# Patient Record
Sex: Female | Born: 1935 | Race: Black or African American | Hispanic: Yes | Marital: Married | Smoking: Former smoker
Health system: Southern US, Community
[De-identification: ages and names within clinical notes are randomized; demographics above are authoritative.]

## PROBLEM LIST (undated history)

## (undated) DIAGNOSIS — R42 Dizziness and giddiness: Secondary | ICD-10-CM

## (undated) DIAGNOSIS — K649 Unspecified hemorrhoids: Secondary | ICD-10-CM

## (undated) DIAGNOSIS — K589 Irritable bowel syndrome without diarrhea: Secondary | ICD-10-CM

## (undated) DIAGNOSIS — R351 Nocturia: Secondary | ICD-10-CM

## (undated) DIAGNOSIS — G43909 Migraine, unspecified, not intractable, without status migrainosus: Secondary | ICD-10-CM

## (undated) DIAGNOSIS — D259 Leiomyoma of uterus, unspecified: Secondary | ICD-10-CM

## (undated) DIAGNOSIS — M2240 Chondromalacia patellae, unspecified knee: Secondary | ICD-10-CM

## (undated) DIAGNOSIS — Z8719 Personal history of other diseases of the digestive system: Secondary | ICD-10-CM

## (undated) DIAGNOSIS — Z96651 Presence of right artificial knee joint: Secondary | ICD-10-CM

## (undated) DIAGNOSIS — Z9889 Other specified postprocedural states: Secondary | ICD-10-CM

## (undated) DIAGNOSIS — M19071 Primary osteoarthritis, right ankle and foot: Secondary | ICD-10-CM

## (undated) DIAGNOSIS — M5136 Other intervertebral disc degeneration, lumbar region: Secondary | ICD-10-CM

## (undated) DIAGNOSIS — R600 Localized edema: Secondary | ICD-10-CM

## (undated) DIAGNOSIS — K209 Esophagitis, unspecified: Secondary | ICD-10-CM

## (undated) DIAGNOSIS — Z923 Personal history of irradiation: Secondary | ICD-10-CM

## (undated) DIAGNOSIS — C801 Malignant (primary) neoplasm, unspecified: Secondary | ICD-10-CM

## (undated) DIAGNOSIS — M19072 Primary osteoarthritis, left ankle and foot: Secondary | ICD-10-CM

## (undated) DIAGNOSIS — R112 Nausea with vomiting, unspecified: Secondary | ICD-10-CM

## (undated) DIAGNOSIS — C50919 Malignant neoplasm of unspecified site of unspecified female breast: Secondary | ICD-10-CM

## (undated) DIAGNOSIS — Z8739 Personal history of other diseases of the musculoskeletal system and connective tissue: Secondary | ICD-10-CM

## (undated) DIAGNOSIS — I4891 Unspecified atrial fibrillation: Secondary | ICD-10-CM

## (undated) DIAGNOSIS — M1990 Unspecified osteoarthritis, unspecified site: Secondary | ICD-10-CM

## (undated) DIAGNOSIS — M19041 Primary osteoarthritis, right hand: Secondary | ICD-10-CM

## (undated) DIAGNOSIS — M19042 Primary osteoarthritis, left hand: Secondary | ICD-10-CM

## (undated) DIAGNOSIS — K449 Diaphragmatic hernia without obstruction or gangrene: Secondary | ICD-10-CM

## (undated) DIAGNOSIS — K219 Gastro-esophageal reflux disease without esophagitis: Secondary | ICD-10-CM

## (undated) DIAGNOSIS — I10 Essential (primary) hypertension: Secondary | ICD-10-CM

## (undated) DIAGNOSIS — Z8759 Personal history of other complications of pregnancy, childbirth and the puerperium: Principal | ICD-10-CM

## (undated) DIAGNOSIS — I499 Cardiac arrhythmia, unspecified: Secondary | ICD-10-CM

## (undated) DIAGNOSIS — Z9221 Personal history of antineoplastic chemotherapy: Secondary | ICD-10-CM

## (undated) HISTORY — DX: Unspecified atrial fibrillation: I48.91

## (undated) HISTORY — DX: Irritable bowel syndrome, unspecified: K58.9

## (undated) HISTORY — DX: Primary osteoarthritis, right ankle and foot: M19.071

## (undated) HISTORY — PX: SHOULDER SURGERY: SHX246

## (undated) HISTORY — DX: Dizziness and giddiness: R42

## (undated) HISTORY — PX: COLONOSCOPY: SHX174

## (undated) HISTORY — DX: Personal history of other complications of pregnancy, childbirth and the puerperium: Z87.59

## (undated) HISTORY — DX: Unspecified osteoarthritis, unspecified site: M19.90

## (undated) HISTORY — DX: Diaphragmatic hernia without obstruction or gangrene: K44.9

## (undated) HISTORY — PX: EYE SURGERY: SHX253

## (undated) HISTORY — DX: Primary osteoarthritis, left ankle and foot: M19.072

## (undated) HISTORY — DX: Primary osteoarthritis, left hand: M19.042

## (undated) HISTORY — DX: Chondromalacia patellae, unspecified knee: M22.40

## (undated) HISTORY — DX: Other intervertebral disc degeneration, lumbar region: M51.36

## (undated) HISTORY — DX: Presence of right artificial knee joint: Z96.651

## (undated) HISTORY — DX: Personal history of other diseases of the musculoskeletal system and connective tissue: Z87.39

## (undated) HISTORY — DX: Primary osteoarthritis, right hand: M19.041

## (undated) HISTORY — PX: EXPLORATORY LAPAROTOMY: SUR591

## (undated) HISTORY — DX: Esophagitis, unspecified: K20.9

## (undated) HISTORY — DX: Essential (primary) hypertension: I10

## (undated) HISTORY — PX: APPENDECTOMY: SHX54

## (undated) HISTORY — DX: Localized edema: R60.0

## (undated) HISTORY — DX: Leiomyoma of uterus, unspecified: D25.9

---

## 1965-01-08 HISTORY — PX: UTERINE FIBROID SURGERY: SHX826

## 1972-01-09 HISTORY — PX: ABDOMINAL HYSTERECTOMY: SHX81

## 1984-01-09 HISTORY — PX: BUNIONECTOMY: SHX129

## 1999-01-09 HISTORY — PX: BACK SURGERY: SHX140

## 2004-09-13 ENCOUNTER — Encounter: Admission: RE | Admit: 2004-09-13 | Discharge: 2004-09-13 | Payer: Self-pay | Admitting: Family Medicine

## 2005-09-14 ENCOUNTER — Encounter: Admission: RE | Admit: 2005-09-14 | Discharge: 2005-09-14 | Payer: Self-pay | Admitting: Family Medicine

## 2005-11-02 ENCOUNTER — Ambulatory Visit: Payer: Self-pay | Admitting: Oncology

## 2005-11-15 LAB — CBC WITH DIFFERENTIAL/PLATELET
Basophils Absolute: 0 10*3/uL (ref 0.0–0.1)
EOS%: 0.9 % (ref 0.0–7.0)
Eosinophils Absolute: 0.1 10*3/uL (ref 0.0–0.5)
HGB: 13.9 g/dL (ref 11.6–15.9)
LYMPH%: 57 % — ABNORMAL HIGH (ref 14.0–48.0)
MCV: 82.8 fL (ref 81.0–101.0)
MONO#: 0.4 10*3/uL (ref 0.1–0.9)
MONO%: 7.3 % (ref 0.0–13.0)
NEUT#: 2 10*3/uL (ref 1.5–6.5)
Platelets: 199 10*3/uL (ref 145–400)
RBC: 5.1 10*6/uL (ref 3.70–5.32)
WBC: 5.8 10*3/uL (ref 3.9–10.0)
lymph#: 3.3 10*3/uL (ref 0.9–3.3)

## 2005-11-15 LAB — LACTATE DEHYDROGENASE: LDH: 166 U/L (ref 94–250)

## 2005-11-15 LAB — COMPREHENSIVE METABOLIC PANEL
ALT: 30 U/L (ref 0–35)
Alkaline Phosphatase: 94 U/L (ref 39–117)
BUN: 12 mg/dL (ref 6–23)
Calcium: 10.1 mg/dL (ref 8.4–10.5)
Glucose, Bld: 115 mg/dL — ABNORMAL HIGH (ref 70–99)
Potassium: 4.2 mEq/L (ref 3.5–5.3)
Sodium: 142 mEq/L (ref 135–145)

## 2005-11-15 LAB — CHCC SMEAR

## 2006-03-18 ENCOUNTER — Ambulatory Visit: Payer: Self-pay | Admitting: Oncology

## 2006-03-21 LAB — CBC WITH DIFFERENTIAL/PLATELET
BASO%: 1.3 % (ref 0.0–2.0)
EOS%: 1.2 % (ref 0.0–7.0)
MCH: 27 pg (ref 26.0–34.0)
MONO#: 0.5 10*3/uL (ref 0.1–0.9)
Platelets: 309 10*3/uL (ref 145–400)
RDW: 11.8 % (ref 11.3–14.5)
lymph#: 3.6 10*3/uL — ABNORMAL HIGH (ref 0.9–3.3)

## 2006-09-17 ENCOUNTER — Encounter: Admission: RE | Admit: 2006-09-17 | Discharge: 2006-09-17 | Payer: Self-pay | Admitting: Family Medicine

## 2006-10-04 ENCOUNTER — Ambulatory Visit: Payer: Self-pay | Admitting: Oncology

## 2006-10-22 ENCOUNTER — Other Ambulatory Visit: Admission: RE | Admit: 2006-10-22 | Discharge: 2006-10-22 | Payer: Self-pay | Admitting: Oncology

## 2006-10-22 ENCOUNTER — Encounter: Payer: Self-pay | Admitting: Oncology

## 2006-10-22 LAB — COMPREHENSIVE METABOLIC PANEL
ALT: 20 U/L (ref 0–35)
AST: 19 U/L (ref 0–37)
Calcium: 9.9 mg/dL (ref 8.4–10.5)
Chloride: 104 mEq/L (ref 96–112)
Creatinine, Ser: 0.81 mg/dL (ref 0.40–1.20)
Glucose, Bld: 102 mg/dL — ABNORMAL HIGH (ref 70–99)
Potassium: 4.7 mEq/L (ref 3.5–5.3)
Total Bilirubin: 0.6 mg/dL (ref 0.3–1.2)
Total Protein: 6.9 g/dL (ref 6.0–8.3)

## 2006-10-22 LAB — CBC WITH DIFFERENTIAL/PLATELET
BASO%: 0.5 % (ref 0.0–2.0)
Basophils Absolute: 0 10*3/uL (ref 0.0–0.1)
EOS%: 0.6 % (ref 0.0–7.0)
Eosinophils Absolute: 0 10*3/uL (ref 0.0–0.5)
LYMPH%: 62.9 % — ABNORMAL HIGH (ref 14.0–48.0)
MONO%: 6.7 % (ref 0.0–13.0)
NEUT%: 29.3 % — ABNORMAL LOW (ref 39.6–76.8)
Platelets: 203 10*3/uL (ref 145–400)
RBC: 4.92 10*6/uL (ref 3.70–5.32)
RDW: 14.6 % — ABNORMAL HIGH (ref 11.3–14.5)

## 2006-10-22 LAB — LACTATE DEHYDROGENASE: LDH: 162 U/L (ref 94–250)

## 2006-10-31 LAB — FLOW CYTOMETRY

## 2007-09-22 ENCOUNTER — Encounter: Admission: RE | Admit: 2007-09-22 | Discharge: 2007-09-22 | Payer: Self-pay | Admitting: Family Medicine

## 2007-10-20 ENCOUNTER — Ambulatory Visit: Payer: Self-pay | Admitting: Oncology

## 2008-07-27 ENCOUNTER — Encounter: Admission: RE | Admit: 2008-07-27 | Discharge: 2008-08-24 | Payer: Self-pay | Admitting: Family Medicine

## 2008-09-24 ENCOUNTER — Encounter: Admission: RE | Admit: 2008-09-24 | Discharge: 2008-09-24 | Payer: Self-pay | Admitting: Family Medicine

## 2009-02-24 DIAGNOSIS — I10 Essential (primary) hypertension: Secondary | ICD-10-CM

## 2009-02-24 HISTORY — DX: Essential (primary) hypertension: I10

## 2009-06-29 DIAGNOSIS — R600 Localized edema: Secondary | ICD-10-CM

## 2009-06-29 HISTORY — DX: Localized edema: R60.0

## 2009-09-27 ENCOUNTER — Encounter: Admission: RE | Admit: 2009-09-27 | Discharge: 2009-09-27 | Payer: Self-pay | Admitting: Family Medicine

## 2010-07-11 ENCOUNTER — Ambulatory Visit (INDEPENDENT_AMBULATORY_CARE_PROVIDER_SITE_OTHER): Payer: Medicare Other | Admitting: Surgery

## 2010-07-11 ENCOUNTER — Encounter (INDEPENDENT_AMBULATORY_CARE_PROVIDER_SITE_OTHER): Payer: Self-pay | Admitting: Surgery

## 2010-07-11 VITALS — BP 154/96 | HR 88 | Temp 96.4°F | Ht 67.5 in | Wt 222.2 lb

## 2010-07-11 DIAGNOSIS — R32 Unspecified urinary incontinence: Secondary | ICD-10-CM

## 2010-07-11 DIAGNOSIS — K589 Irritable bowel syndrome without diarrhea: Secondary | ICD-10-CM

## 2010-07-11 DIAGNOSIS — R5383 Other fatigue: Secondary | ICD-10-CM

## 2010-07-11 DIAGNOSIS — K645 Perianal venous thrombosis: Secondary | ICD-10-CM

## 2010-07-11 DIAGNOSIS — R6883 Chills (without fever): Secondary | ICD-10-CM

## 2010-07-11 DIAGNOSIS — K649 Unspecified hemorrhoids: Secondary | ICD-10-CM | POA: Insufficient documentation

## 2010-07-11 DIAGNOSIS — R5381 Other malaise: Secondary | ICD-10-CM

## 2010-07-11 DIAGNOSIS — M1990 Unspecified osteoarthritis, unspecified site: Secondary | ICD-10-CM

## 2010-07-11 DIAGNOSIS — R61 Generalized hyperhidrosis: Secondary | ICD-10-CM

## 2010-07-11 DIAGNOSIS — R635 Abnormal weight gain: Secondary | ICD-10-CM

## 2010-07-11 NOTE — Progress Notes (Signed)
Referring doctor Dr. Marjory Lies Chief Complaint  Patient presents with  . Other    eval of hems   This is a 76 year old female who was sent from Dr. Mellody Life office. Please note that there are no records from his office. The patient reports that she called yesterday to their office with a continued complaint of pelvic pain and perirectal pain and she was just referred to our office with no office visit at Dr. Mellody Life.  The patient reports a recent trip to [place] in which she developed a Giardia infection. She had significant amounts of diarrhea for several days. She subsequently developed a lot of swelling around her rectum as well as in her perineum. She had difficulty with urination. Apparently a urine culture was obtained at Dr. Mellody Life office which was reportedly negative. She states that the swelling has improved somewhat over the last few days. The pain is slightly improved as well. She continues to have bowel movements daily.  Past Medical History  Diagnosis Date  . Hypertension   . IBS (irritable bowel syndrome)   . Arthritis    Past Surgical History  Procedure Date  . Cesarean section 1971  . Abdominal hysterectomy 1974  . Back surgery 2001  . Uterine fibroid surgery 1967  . Bunionectomy 1986   Allergies  Allergen Reactions  . Oxycodone Nausea And Vomiting  . Percocet (Oxycodone-Acetaminophen) Itching and Nausea And Vomiting  . Soma (Carisoprodol) Nausea And Vomiting  . Ultram (Tramadol Hcl) Nausea And Vomiting   See med list  Filed Vitals:   07/11/10 1640  BP: 154/96  Pulse: 88  Temp: 96.4 F (35.8 C)  Physical examination Well-developed,well-nourished female who appears mildly uncomfortable Focus examination of the perirectal region shows several mildly enlarged external hemorrhoids. The most anterior hemorrhoid appears to have some thrombosis but seems to be somewhat soft and is not particularly tender to palpation. I cannot palpate any anal  fissure.  Impression: Thrombosed external hemorrhoid which was probably most acute one week ago. The clot seems to be resolving on its own.  Recommendations: The patient needs to use frequent sitz baths to help with the discomfort and swelling. Recommend using wet wipes instead of dry toilet paper. She may use stool softeners as needed for constipation. No surgical indications at this time. Followup in 2-3 weeks for recheck.

## 2010-07-11 NOTE — Patient Instructions (Signed)
Use stool softeners as needed to prevent constipation. Sitz baths (soak in warm, soapy water) three or four times a day.

## 2010-08-02 ENCOUNTER — Ambulatory Visit (INDEPENDENT_AMBULATORY_CARE_PROVIDER_SITE_OTHER): Payer: Medicare Other | Admitting: Surgery

## 2010-08-02 ENCOUNTER — Encounter (INDEPENDENT_AMBULATORY_CARE_PROVIDER_SITE_OTHER): Payer: Self-pay | Admitting: Surgery

## 2010-08-02 DIAGNOSIS — K645 Perianal venous thrombosis: Secondary | ICD-10-CM

## 2010-08-02 NOTE — Patient Instructions (Signed)
Continue sitz baths, wet wipes, and stool softeners as needed.

## 2010-08-02 NOTE — Progress Notes (Signed)
Followup of her thrombosed external hemorrhoids. These have essentially resolved. She has a few small skin tags. She is nontender on rectal examination. Continue with wet wipes, sitz baths p.r.n., stool softeners p.r.n. Followup p.r.n.

## 2010-08-21 ENCOUNTER — Other Ambulatory Visit: Payer: Self-pay | Admitting: Family Medicine

## 2010-08-21 DIAGNOSIS — Z1231 Encounter for screening mammogram for malignant neoplasm of breast: Secondary | ICD-10-CM

## 2010-09-29 ENCOUNTER — Ambulatory Visit: Payer: Self-pay

## 2010-10-04 ENCOUNTER — Ambulatory Visit
Admission: RE | Admit: 2010-10-04 | Discharge: 2010-10-04 | Disposition: A | Discharge status: Home / Self Care | Payer: Medicare Other | Source: Ambulatory Visit | Attending: Family Medicine | Admitting: Family Medicine

## 2010-10-04 DIAGNOSIS — Z1231 Encounter for screening mammogram for malignant neoplasm of breast: Secondary | ICD-10-CM

## 2011-09-14 ENCOUNTER — Other Ambulatory Visit: Payer: Self-pay | Admitting: Family Medicine

## 2011-09-14 DIAGNOSIS — Z1231 Encounter for screening mammogram for malignant neoplasm of breast: Secondary | ICD-10-CM

## 2011-10-05 ENCOUNTER — Ambulatory Visit
Admission: RE | Admit: 2011-10-05 | Discharge: 2011-10-05 | Disposition: A | Discharge status: Home / Self Care | Payer: Medicare Other | Source: Ambulatory Visit | Attending: Family Medicine | Admitting: Family Medicine

## 2011-10-05 DIAGNOSIS — Z1231 Encounter for screening mammogram for malignant neoplasm of breast: Secondary | ICD-10-CM

## 2011-10-09 ENCOUNTER — Other Ambulatory Visit: Payer: Self-pay | Admitting: Family Medicine

## 2011-10-09 DIAGNOSIS — R928 Other abnormal and inconclusive findings on diagnostic imaging of breast: Secondary | ICD-10-CM

## 2011-10-11 ENCOUNTER — Ambulatory Visit
Admission: RE | Admit: 2011-10-11 | Discharge: 2011-10-11 | Disposition: A | Discharge status: Home / Self Care | Payer: Medicare Other | Source: Ambulatory Visit | Attending: Family Medicine | Admitting: Family Medicine

## 2011-10-11 DIAGNOSIS — R928 Other abnormal and inconclusive findings on diagnostic imaging of breast: Secondary | ICD-10-CM

## 2012-04-08 ENCOUNTER — Encounter: Payer: Self-pay | Admitting: *Deleted

## 2012-04-09 ENCOUNTER — Encounter: Payer: Self-pay | Admitting: Cardiovascular Disease

## 2012-12-03 ENCOUNTER — Telehealth: Payer: Self-pay | Admitting: *Deleted

## 2012-12-03 DIAGNOSIS — Z01818 Encounter for other preprocedural examination: Secondary | ICD-10-CM

## 2012-12-03 DIAGNOSIS — I10 Essential (primary) hypertension: Secondary | ICD-10-CM

## 2012-12-03 DIAGNOSIS — I480 Paroxysmal atrial fibrillation: Secondary | ICD-10-CM

## 2012-12-03 NOTE — Telephone Encounter (Signed)
Dr Allyson Sabal reviewed the chart and want Ms Guastella to have a myoview prior for surgical clearance.  I spoke with patient and she is agreeable.  I will proceed with ordering myoview

## 2012-12-10 ENCOUNTER — Encounter (HOSPITAL_COMMUNITY): Payer: Medicare Other

## 2012-12-11 ENCOUNTER — Ambulatory Visit (HOSPITAL_COMMUNITY)
Admission: RE | Admit: 2012-12-11 | Discharge: 2012-12-11 | Disposition: A | Discharge status: Home / Self Care | Payer: Medicare Other | Source: Ambulatory Visit | Attending: Cardiovascular Disease | Admitting: Cardiovascular Disease

## 2012-12-11 DIAGNOSIS — Z01810 Encounter for preprocedural cardiovascular examination: Secondary | ICD-10-CM

## 2012-12-11 DIAGNOSIS — I480 Paroxysmal atrial fibrillation: Secondary | ICD-10-CM

## 2012-12-11 DIAGNOSIS — E663 Overweight: Secondary | ICD-10-CM | POA: Insufficient documentation

## 2012-12-11 DIAGNOSIS — Z87891 Personal history of nicotine dependence: Secondary | ICD-10-CM | POA: Insufficient documentation

## 2012-12-11 DIAGNOSIS — I4891 Unspecified atrial fibrillation: Secondary | ICD-10-CM

## 2012-12-11 DIAGNOSIS — Z8249 Family history of ischemic heart disease and other diseases of the circulatory system: Secondary | ICD-10-CM | POA: Insufficient documentation

## 2012-12-11 DIAGNOSIS — Z01818 Encounter for other preprocedural examination: Secondary | ICD-10-CM

## 2012-12-11 DIAGNOSIS — I10 Essential (primary) hypertension: Secondary | ICD-10-CM | POA: Insufficient documentation

## 2012-12-11 MED ORDER — AMINOPHYLLINE 25 MG/ML IV SOLN
75.0000 mg | Freq: Once | INTRAVENOUS | Status: AC
  Administered 2012-12-11: 75 mg via INTRAVENOUS

## 2012-12-11 MED ORDER — TECHNETIUM TC 99M SESTAMIBI GENERIC - CARDIOLITE
10.9000 | Freq: Once | INTRAVENOUS | Status: AC | PRN
  Administered 2012-12-11: 10.9 via INTRAVENOUS

## 2012-12-11 MED ORDER — REGADENOSON 0.4 MG/5ML IV SOLN
0.4000 mg | Freq: Once | INTRAVENOUS | Status: AC
  Administered 2012-12-11: 0.4 mg via INTRAVENOUS

## 2012-12-11 MED ORDER — TECHNETIUM TC 99M SESTAMIBI GENERIC - CARDIOLITE
30.2000 | Freq: Once | INTRAVENOUS | Status: AC | PRN
  Administered 2012-12-11: 30.2 via INTRAVENOUS

## 2012-12-11 NOTE — Procedures (Addendum)
[facility] [address] [place] [address] [phone]  Cardiology Nuclear Med Study  Tara Cox is a 78 y.o. female     MRN : [id number]     DOB: 1934-04-01  Procedure Date: 12/11/2012  Nuclear Med Background Indication for Stress Test:  Surgical Clearance History:  A-Fib Cardiac Risk Factors: Family History - CAD, History of Smoking, Hypertension, Lipids and Overweight  Symptoms:  Pt denies symptoms.   Nuclear Pre-Procedure Caffeine/Decaff Intake:  7:00pm NPO After: 5:00am   IV Site: R Hand  IV 0.9% NS with Angio Cath:  22g  Chest Size (in):  n/a IV Started by: Emmit Pomfret, RN  Height: 5\' 8"  (1.727 m)  Cup Size: B  BMI:  Body mass index is 34.98 kg/(m^2). Weight:  230 lb (104.327 kg)   Tech Comments:  n/a    Nuclear Med Study 1 or 2 day study: 1 day  Stress Test Type:  Lexiscan  Order Authorizing Provider:  Nanetta Batty, MD   Resting Radionuclide: Technetium 64m Sestamibi  Resting Radionuclide Dose: 10.9 mCi   Stress Radionuclide:  Technetium 64m Sestamibi  Stress Radionuclide Dose: 30.2 mCi           Stress Protocol Rest HR: 63 Stress HR: 82  Rest BP: 137/86 Stress BP: 144/85  Exercise Time (min): n/a METS: n/a   Predicted Max HR: 144 bpm % Max HR: 67.36 bpm Rate Pressure Product: 21308  Dose of Adenosine (mg):  n/a Dose of Lexiscan: 0.4 mg  Dose of Atropine (mg): n/a Dose of Dobutamine: n/a mcg/kg/min (at max HR)  Stress Test Technologist: Esperanza Sheets, CCT Nuclear Technologist: Gonzella Lex, CNMT   Rest Procedure:  Myocardial perfusion imaging was performed at rest 45 minutes following the intravenous administration of Technetium 57m Sestamibi. Stress Procedure:  The patient received IV Lexiscan 0.4 mg over 15-seconds.  Technetium 82m Sestamibi injected at 30-seconds.  The patient experienced SOB and a headache; 75 mg IV Aminophylline was administered with resolution of symptoms.  There  were  significant changes with Lexiscan.  Quantitative spect images were obtained after a 45 minute delay.  Transient Ischemic Dilatation (Normal <1.22):  0.98 Lung/Heart Ratio (Normal <0.45):  0.22 QGS EDV:  62 ml QGS ESV:  17 ml LV Ejection Fraction: 72%  Signed by     Rest ECG: NSR - Normal EKG  Stress ECG: No significant change from baseline ECG  QPS Raw Data Images:  Normal; no motion artifact; normal heart/lung ratio. Stress Images:  Normal homogeneous uptake in all areas of the myocardium. Rest Images:  Normal homogeneous uptake in all areas of the myocardium. Subtraction (SDS):  No evidence of ischemia.  Impression Exercise Capacity:  Lexiscan with no exercise. BP Response:  Normal blood pressure response. Clinical Symptoms:  No significant symptoms noted. ECG Impression:  No significant ST segment change suggestive of ischemia. Comparison with Prior Nuclear Study: No significant change from previous study  Overall Impression:  Normal stress nuclear study.  LV Wall Motion:  NL LV Function; NL Wall Motion   Runell Gess, MD  12/11/2012 11:04 AM

## 2013-01-12 ENCOUNTER — Other Ambulatory Visit: Payer: Self-pay | Admitting: Orthopedic Surgery

## 2013-01-12 ENCOUNTER — Encounter (HOSPITAL_COMMUNITY): Payer: Self-pay | Admitting: Respiratory Therapy

## 2013-01-16 NOTE — Pre-Procedure Instructions (Signed)
CHESTINE BELKNAP  01/16/2013   Your procedure is scheduled on:  Monday January 26, 2013 @ 8:49 AM.  Report to [facility] Short Stay Entrance "A"  Admitting at 6:45 AM.  Call this number if you have problems the morning of surgery: [phone]   Remember:   Do not eat food or drink liquids after midnight.   Take these medicines the morning of surgery with A SIP OF WATER: Metoprolol (Lopressor)   Do not wear jewelry, make-up or nail polish.  Do not wear lotions, powders, or perfumes. You may wear deodorant.  Do not shave 48 hours prior to surgery.   Do not bring valuables to the hospital.  [facility] is not responsible for any belongings or valuables.               Contacts, dentures or bridgework may not be worn into surgery.  Leave suitcase in the car. After surgery it may be brought to your room.  For patients admitted to the hospital, discharge time is determined by your treatment team.               Patients discharged the day of surgery will not be allowed to drive home.  Name and phone number of your driver: Family/Friend  Special Instructions: Shower using CHG 2 nights before surgery and the night before surgery.  If you shower the day of surgery use CHG.  Use special wash - you have one bottle of CHG for all showers.  You should use approximately 1/3 of the bottle for each shower.   Please read over the following fact sheets that you were given: Pain Booklet, Coughing and Deep Breathing, Blood Transfusion Information, Total Joint Packet, MRSA Information and Surgical Site Infection Prevention

## 2013-01-19 ENCOUNTER — Encounter (HOSPITAL_COMMUNITY)
Admission: RE | Admit: 2013-01-19 | Discharge: 2013-01-19 | Disposition: A | Discharge status: Home / Self Care | Payer: Medicare Other | Source: Ambulatory Visit | Attending: Orthopedic Surgery | Admitting: Orthopedic Surgery

## 2013-01-19 ENCOUNTER — Ambulatory Visit (HOSPITAL_COMMUNITY)
Admission: RE | Admit: 2013-01-19 | Discharge: 2013-01-19 | Disposition: A | Discharge status: Home / Self Care | Payer: Medicare Other | Source: Ambulatory Visit | Attending: Orthopedic Surgery | Admitting: Orthopedic Surgery

## 2013-01-19 ENCOUNTER — Encounter (HOSPITAL_COMMUNITY): Payer: Self-pay

## 2013-01-19 DIAGNOSIS — Z01818 Encounter for other preprocedural examination: Secondary | ICD-10-CM | POA: Insufficient documentation

## 2013-01-19 DIAGNOSIS — Z01812 Encounter for preprocedural laboratory examination: Secondary | ICD-10-CM | POA: Insufficient documentation

## 2013-01-19 DIAGNOSIS — G4733 Obstructive sleep apnea (adult) (pediatric): Secondary | ICD-10-CM | POA: Insufficient documentation

## 2013-01-19 DIAGNOSIS — K219 Gastro-esophageal reflux disease without esophagitis: Secondary | ICD-10-CM | POA: Insufficient documentation

## 2013-01-19 DIAGNOSIS — Z01810 Encounter for preprocedural cardiovascular examination: Secondary | ICD-10-CM | POA: Insufficient documentation

## 2013-01-19 DIAGNOSIS — I10 Essential (primary) hypertension: Secondary | ICD-10-CM | POA: Insufficient documentation

## 2013-01-19 HISTORY — DX: Other specified postprocedural states: Z98.890

## 2013-01-19 HISTORY — DX: Nocturia: R35.1

## 2013-01-19 HISTORY — DX: Migraine, unspecified, not intractable, without status migrainosus: G43.909

## 2013-01-19 HISTORY — DX: Gastro-esophageal reflux disease without esophagitis: K21.9

## 2013-01-19 HISTORY — DX: Other specified postprocedural states: R11.2

## 2013-01-19 HISTORY — DX: Unspecified hemorrhoids: K64.9

## 2013-01-19 LAB — URINALYSIS, ROUTINE W REFLEX MICROSCOPIC
Bilirubin Urine: NEGATIVE
Glucose, UA: NEGATIVE mg/dL
Hgb urine dipstick: NEGATIVE
Ketones, ur: NEGATIVE mg/dL
LEUKOCYTES UA: NEGATIVE
NITRITE: NEGATIVE
PROTEIN: NEGATIVE mg/dL
Specific Gravity, Urine: 1.007 (ref 1.005–1.030)
UROBILINOGEN UA: 0.2 mg/dL (ref 0.0–1.0)
pH: 5 (ref 5.0–8.0)

## 2013-01-19 LAB — TYPE AND SCREEN
ABO/RH(D): B POS
Antibody Screen: NEGATIVE

## 2013-01-19 LAB — CBC WITH DIFFERENTIAL/PLATELET
BASOS ABS: 0 10*3/uL (ref 0.0–0.1)
BASOS PCT: 1 % (ref 0–1)
EOS ABS: 0.1 10*3/uL (ref 0.0–0.7)
EOS PCT: 2 % (ref 0–5)
HEMATOCRIT: 43.8 % (ref 36.0–46.0)
HEMOGLOBIN: 14.8 g/dL (ref 12.0–15.0)
Lymphocytes Relative: 52 % — ABNORMAL HIGH (ref 12–46)
Lymphs Abs: 3.2 10*3/uL (ref 0.7–4.0)
MCH: 27.3 pg (ref 26.0–34.0)
MCHC: 33.8 g/dL (ref 30.0–36.0)
MCV: 80.8 fL (ref 78.0–100.0)
MONO ABS: 0.4 10*3/uL (ref 0.1–1.0)
MONOS PCT: 6 % (ref 3–12)
Neutro Abs: 2.5 10*3/uL (ref 1.7–7.7)
Neutrophils Relative %: 40 % — ABNORMAL LOW (ref 43–77)
Platelets: 182 10*3/uL (ref 150–400)
RBC: 5.42 MIL/uL — ABNORMAL HIGH (ref 3.87–5.11)
RDW: 14.6 % (ref 11.5–15.5)
WBC: 6.2 10*3/uL (ref 4.0–10.5)

## 2013-01-19 LAB — COMPREHENSIVE METABOLIC PANEL
ALBUMIN: 3.8 g/dL (ref 3.5–5.2)
ALT: 36 U/L — ABNORMAL HIGH (ref 0–35)
AST: 27 U/L (ref 0–37)
Alkaline Phosphatase: 95 U/L (ref 39–117)
BUN: 21 mg/dL (ref 6–23)
CALCIUM: 9.7 mg/dL (ref 8.4–10.5)
CO2: 26 mEq/L (ref 19–32)
CREATININE: 0.77 mg/dL (ref 0.50–1.10)
Chloride: 101 mEq/L (ref 96–112)
GFR calc Af Amer: >90 mL/min (ref >90)
GFR calc non Af Amer: 80 mL/min — ABNORMAL LOW (ref >90)
Glucose, Bld: 94 mg/dL (ref 70–99)
Potassium: 4 mEq/L (ref 3.7–5.3)
Sodium: 140 mEq/L (ref 137–147)
TOTAL PROTEIN: 7.3 g/dL (ref 6.0–8.3)
Total Bilirubin: 0.4 mg/dL (ref 0.3–1.2)

## 2013-01-19 LAB — SURGICAL PCR SCREEN
MRSA, PCR: NEGATIVE
STAPHYLOCOCCUS AUREUS: NEGATIVE

## 2013-01-19 LAB — APTT: APTT: 46 s — AB (ref 24–37)

## 2013-01-19 LAB — ABO/RH: ABO/RH(D): B POS

## 2013-01-19 LAB — PROTIME-INR
INR: 2.4 — ABNORMAL HIGH (ref 0.00–1.49)
PROTHROMBIN TIME: 25.4 s — AB (ref 11.6–15.2)

## 2013-01-19 NOTE — Progress Notes (Signed)
Patient informed Nurse that she had a stress test with Cardiologist Dr. Quay Burow last year 12/11/12; results in EPIC. Patient denied having a cardiac cath. Sleep apnea results sent to PCP Dr. Juanita Craver. When asked about stopping Coumadin patient stated "I was told to stop it 5 days before my surgery."

## 2013-01-19 NOTE — Progress Notes (Signed)
01/19/13 0935  OBSTRUCTIVE SLEEP APNEA  Have you ever been diagnosed with sleep apnea through a sleep study? No  Do you snore loudly (loud enough to be heard through closed doors)?  0  Do you often feel tired, fatigued, or sleepy during the daytime? 1  Has anyone observed you stop breathing during your sleep? 0  Do you have, or are you being treated for high blood pressure? 1  BMI more than 35 kg/m2? 1  Age over 77 years old? 1  Neck circumference greater than 40 cm/18 inches? 0  Gender: 0  Obstructive Sleep Apnea Score 4  Score 4 or greater  Results sent to PCP   This patient has screened at risk for sleep apnea using the STOP Bang tool used during a pre-surgical visit. A score of 4 or greater is at risk for sleep apnea.

## 2013-01-20 LAB — URINE CULTURE: Colony Count: 2000

## 2013-01-20 NOTE — H&P (Signed)
Tara Cox MRN:  [id number] DOB/SEX:  1934/03/16/female  CHIEF COMPLAINT:  Painful right Knee  HISTORY: Patient is a 79 y.o. female presented with a history of pain in the right knee. Onset of symptoms was gradual starting several years ago with gradually worsening course since that time. Prior procedures on the knee include none. Patient has been treated conservatively with over-the-counter NSAIDs and activity modification. Patient currently rates pain in the knee at 9 out of 10 with activity. There is pain at night.  PAST MEDICAL HISTORY: Patient Active Problem List   Diagnosis Date Noted  . Hemorrhoids 07/11/2010  . IBS (irritable bowel syndrome) 07/11/2010  . Fatigue/loss of sleep 07/11/2010  . Night sweats 07/11/2010  . Chills 07/11/2010  . Weight gain 07/11/2010  . Arthritis 07/11/2010  . Incontinence of urine 07/11/2010  . Thrombosed external hemorrhoids 07/11/2010   Past Medical History  Diagnosis Date  . IBS (irritable bowel syndrome)   . Arthritis   . A-fib   . Hypertension 02/24/09    Echo-EF 62%; Myocardial Perfusion Study-Normal. No signifcant ischemia noted.  . Edema of lower extremity 06/29/09    Lower Venous Exam- normal. No evidence of thrombus or thrombophlebitis.  Marland Kitchen PONV (postoperative nausea and vomiting)   . Migraine   . GERD (gastroesophageal reflux disease)   . Hemorrhoids   . Frequent urination at night    Past Surgical History  Procedure Laterality Date  . Cesarean section  1971  . Abdominal hysterectomy  1974  . Back surgery  2001  . Uterine fibroid surgery  1967  . Bunionectomy  1986  . Appendectomy    . Eye surgery Bilateral     cataract removal  . Colonoscopy       MEDICATIONS:   No prescriptions prior to admission    ALLERGIES:   Allergies  Allergen Reactions  . Oxycodone Nausea And Vomiting    Full body weakness   . Percocet [Oxycodone-Acetaminophen] Itching  . Soma [Carisoprodol] Nausea And Vomiting  . Ultram [Tramadol  Hcl] Nausea And Vomiting    REVIEW OF SYSTEMS:  Pertinent items are noted in HPI.   FAMILY HISTORY:   Family History  Problem Relation Age of Onset  . Heart disease Father     heart attack  . Diabetes Sister   . Other Sister     pacemaker  . Diabetes Sister   . Heart disease Sister     SOCIAL HISTORY:   History  Substance Use Topics  . Smoking status: Former Research scientist (life sciences)  . Smokeless tobacco: Not on file  . Alcohol Use: No     EXAMINATION:  Vital signs in last 24 hours:    General appearance: alert, cooperative and no distress Lungs: clear to auscultation bilaterally Heart: regular rate and rhythm, S1, S2 normal, no murmur, click, rub or gallop Abdomen: soft, non-tender; bowel sounds normal; no masses,  no organomegaly Extremities: extremities normal, atraumatic, no cyanosis or edema and Homans sign is negative, no sign of DVT Pulses: 2+ and symmetric Skin: Skin color, texture, turgor normal. No rashes or lesions Neurologic: Alert and oriented X 3, normal strength and tone. Normal symmetric reflexes. Normal coordination and gait  Musculoskeletal:  ROM 0-115, Ligaments intact,  Imaging Review Plain radiographs demonstrate severe degenerative joint disease of the right knee. The overall alignment is mild valgus. The bone quality appears to be good for age and reported activity level.  Assessment/Plan: End stage arthritis, right knee   The patient history, physical examination and  imaging studies are consistent with advanced degenerative joint disease of the right knee. The patient has failed conservative treatment.  The clearance notes were reviewed.  After discussion with the patient it was felt that Total Knee Replacement was indicated. The procedure,  risks, and benefits of total knee arthroplasty were presented and reviewed. The risks including but not limited to aseptic loosening, infection, blood clots, vascular injury, stiffness, patella tracking problems complications  among others were discussed. The patient acknowledged the explanation, agreed to proceed with the plan.  Llewellyn Schoenberger 01/20/2013, 1:35 PM

## 2013-01-20 NOTE — Progress Notes (Signed)
Anesthesia Chart Review:  Patient is a 79 year old female scheduled for right TKA on 01/26/13 by Dr. Ronnie Derby. History includes PAF, former smoker, post-operative N/V, IBS, arthritis, GERD, HTN, migraines, LE edema, hysterectomy, back surgery. OSA screening score was 4. PCP is Dr. Juanita Craver.  Cardiologist is Dr. Quay Burow.  Nuclear stress test on 12/11/12 was normal without evidence of ischemia, normal LV function and wall motion. EF 72%.  Echo on 02/24/09 showed: proximal septal thickening. Mild asymmetric left ventricular hypertrophy. Left ventricular systolic function is normal. The mitral valve leaflets appear thickened but open well. Trace mitral regurgitation. Trace tricuspid regurgitation. Aortic valve is normal in structure and function.  EKG on 01/19/13 showed NSR, low voltage QRS, non-specific ST abnormality.  CXR on 01/19/13 showed no active cardiopulmonary disease.  Preoperative labs noted.  PT/PTT elevated. She was already told to hold Coumadin five days preoperatively.  Repeat PT/PTT on arrival.  Urine culture is pending.  If follow-up PT/PTT acceptable then I would anticipate that she could proceed as planned.  George Hugh [facility] Short Stay Center/Anesthesiology Phone [phone] 01/20/2013 11:10 AM

## 2013-01-23 NOTE — Progress Notes (Signed)
Notified patient of time change.  Instructed patient to arrive at 730 am 01-26-13.

## 2013-01-25 MED ORDER — CHLORHEXIDINE GLUCONATE 4 % EX LIQD: 60.0000 mL | Freq: Once | CUTANEOUS | Status: DC

## 2013-01-25 MED ORDER — CEFAZOLIN SODIUM-DEXTROSE 2-3 GM-% IV SOLR
2.0000 g | INTRAVENOUS | Status: AC
Start: 2013-01-26 — End: 2013-01-26
  Administered 2013-01-26: 2 g via INTRAVENOUS
  Filled 2013-01-25: qty 50

## 2013-01-25 MED ORDER — TRANEXAMIC ACID 100 MG/ML IV SOLN
1000.0000 mg | INTRAVENOUS | Status: AC
  Administered 2013-01-26: 1000 mg via INTRAVENOUS
  Filled 2013-01-25: qty 10

## 2013-01-26 ENCOUNTER — Encounter (HOSPITAL_COMMUNITY): Payer: Medicare Other | Admitting: Vascular Surgery

## 2013-01-26 ENCOUNTER — Encounter (HOSPITAL_COMMUNITY)
Admission: RE | Disposition: A | Discharge status: Home Health | Payer: Self-pay | Source: Ambulatory Visit | Attending: Orthopedic Surgery

## 2013-01-26 ENCOUNTER — Encounter (HOSPITAL_COMMUNITY): Payer: Self-pay | Admitting: Anesthesiology

## 2013-01-26 ENCOUNTER — Inpatient Hospital Stay (HOSPITAL_COMMUNITY): Payer: Medicare Other | Admitting: Anesthesiology

## 2013-01-26 ENCOUNTER — Inpatient Hospital Stay (HOSPITAL_COMMUNITY)
Admission: RE | Admit: 2013-01-26 | Discharge: 2013-01-28 | DRG: 470 | Disposition: A | Discharge status: Home Health | Payer: Medicare Other | Source: Ambulatory Visit | Attending: Orthopedic Surgery | Admitting: Orthopedic Surgery

## 2013-01-26 DIAGNOSIS — G43909 Migraine, unspecified, not intractable, without status migrainosus: Secondary | ICD-10-CM | POA: Diagnosis present

## 2013-01-26 DIAGNOSIS — Z87891 Personal history of nicotine dependence: Secondary | ICD-10-CM

## 2013-01-26 DIAGNOSIS — K589 Irritable bowel syndrome without diarrhea: Secondary | ICD-10-CM | POA: Diagnosis present

## 2013-01-26 DIAGNOSIS — R110 Nausea: Secondary | ICD-10-CM | POA: Diagnosis not present

## 2013-01-26 DIAGNOSIS — K219 Gastro-esophageal reflux disease without esophagitis: Secondary | ICD-10-CM | POA: Diagnosis present

## 2013-01-26 DIAGNOSIS — T40605A Adverse effect of unspecified narcotics, initial encounter: Secondary | ICD-10-CM | POA: Diagnosis not present

## 2013-01-26 DIAGNOSIS — I10 Essential (primary) hypertension: Secondary | ICD-10-CM | POA: Diagnosis present

## 2013-01-26 DIAGNOSIS — Z9089 Acquired absence of other organs: Secondary | ICD-10-CM

## 2013-01-26 DIAGNOSIS — M1710 Unilateral primary osteoarthritis, unspecified knee: Principal | ICD-10-CM | POA: Diagnosis present

## 2013-01-26 DIAGNOSIS — Z8249 Family history of ischemic heart disease and other diseases of the circulatory system: Secondary | ICD-10-CM

## 2013-01-26 DIAGNOSIS — Z7901 Long term (current) use of anticoagulants: Secondary | ICD-10-CM

## 2013-01-26 DIAGNOSIS — Z885 Allergy status to narcotic agent status: Secondary | ICD-10-CM

## 2013-01-26 DIAGNOSIS — Y9210 Unspecified residential institution as the place of occurrence of the external cause: Secondary | ICD-10-CM | POA: Diagnosis not present

## 2013-01-26 DIAGNOSIS — I4891 Unspecified atrial fibrillation: Secondary | ICD-10-CM | POA: Diagnosis present

## 2013-01-26 DIAGNOSIS — M179 Osteoarthritis of knee, unspecified: Secondary | ICD-10-CM | POA: Diagnosis present

## 2013-01-26 DIAGNOSIS — Z833 Family history of diabetes mellitus: Secondary | ICD-10-CM

## 2013-01-26 DIAGNOSIS — Z79899 Other long term (current) drug therapy: Secondary | ICD-10-CM

## 2013-01-26 HISTORY — PX: TOTAL KNEE ARTHROPLASTY: SHX125

## 2013-01-26 LAB — GLUCOSE, CAPILLARY: GLUCOSE-CAPILLARY: 101 mg/dL — AB (ref 70–99)

## 2013-01-26 LAB — CBC
HCT: 39.7 % (ref 36.0–46.0)
HEMOGLOBIN: 12.9 g/dL (ref 12.0–15.0)
MCH: 27.1 pg (ref 26.0–34.0)
MCHC: 32.5 g/dL (ref 30.0–36.0)
MCV: 83.4 fL (ref 78.0–100.0)
Platelets: 147 10*3/uL — ABNORMAL LOW (ref 150–400)
RBC: 4.76 MIL/uL (ref 3.87–5.11)
RDW: 14.2 % (ref 11.5–15.5)
WBC: 12 10*3/uL — ABNORMAL HIGH (ref 4.0–10.5)

## 2013-01-26 LAB — PROTIME-INR
INR: 0.99 (ref 0.00–1.49)
Prothrombin Time: 12.9 seconds (ref 11.6–15.2)

## 2013-01-26 LAB — CREATININE, SERUM
Creatinine, Ser: 0.72 mg/dL (ref 0.50–1.10)
GFR calc Af Amer: >90 mL/min (ref >90)
GFR, EST NON AFRICAN AMERICAN: 81 mL/min — AB (ref >90)

## 2013-01-26 LAB — APTT: aPTT: 28 seconds (ref 24–37)

## 2013-01-26 SURGERY — ARTHROPLASTY, KNEE, TOTAL
Anesthesia: Regional | Site: Knee | Laterality: Right

## 2013-01-26 MED ORDER — ALUM & MAG HYDROXIDE-SIMETH 200-200-20 MG/5ML PO SUSP: 30.0000 mL | ORAL | Status: DC | PRN

## 2013-01-26 MED ORDER — ONDANSETRON HCL 4 MG/2ML IJ SOLN
INTRAMUSCULAR | Status: DC | PRN
  Administered 2013-01-26: 4 mg via INTRAVENOUS

## 2013-01-26 MED ORDER — HYDROMORPHONE HCL PF 1 MG/ML IJ SOLN
INTRAMUSCULAR | Status: AC
  Administered 2013-01-26: 0.5 mg via INTRAVENOUS
  Filled 2013-01-26: qty 1

## 2013-01-26 MED ORDER — ONDANSETRON HCL 4 MG/2ML IJ SOLN: 4.0000 mg | Freq: Once | INTRAMUSCULAR | Status: DC | PRN

## 2013-01-26 MED ORDER — SODIUM CHLORIDE 0.9 % IR SOLN
Status: DC | PRN
  Administered 2013-01-26: 1000 mL

## 2013-01-26 MED ORDER — ACETAMINOPHEN 650 MG RE SUPP: 650.0000 mg | Freq: Four times a day (QID) | RECTAL | Status: DC | PRN

## 2013-01-26 MED ORDER — CYCLOSPORINE 0.05 % OP EMUL
1.0000 [drp] | Freq: Two times a day (BID) | OPHTHALMIC | Status: DC
  Filled 2013-01-26 (×3): qty 1

## 2013-01-26 MED ORDER — FUROSEMIDE 20 MG PO TABS
20.0000 mg | ORAL_TABLET | Freq: Every day | ORAL | Status: DC
  Filled 2013-01-26 (×3): qty 1

## 2013-01-26 MED ORDER — TRIAMCINOLONE ACETONIDE 0.1 % EX CREA
1.0000 "application " | TOPICAL_CREAM | Freq: Two times a day (BID) | CUTANEOUS | Status: DC
  Filled 2013-01-26: qty 15

## 2013-01-26 MED ORDER — HYDROMORPHONE HCL PF 1 MG/ML IJ SOLN
0.2500 mg | INTRAMUSCULAR | Status: DC | PRN
  Administered 2013-01-26: 0.5 mg via INTRAVENOUS
  Administered 2013-01-26: 0.25 mg via INTRAVENOUS
  Administered 2013-01-26: 0.5 mg via INTRAVENOUS
  Administered 2013-01-26: 0.25 mg via INTRAVENOUS

## 2013-01-26 MED ORDER — HYDROMORPHONE HCL PF 1 MG/ML IJ SOLN
INTRAMUSCULAR | Status: AC
  Administered 2013-01-26: 0.25 mg via INTRAVENOUS
  Filled 2013-01-26: qty 1

## 2013-01-26 MED ORDER — ROPIVACAINE HCL 5 MG/ML IJ SOLN
INTRAMUSCULAR | Status: DC | PRN
  Administered 2013-01-26: 20 mL via PERINEURAL

## 2013-01-26 MED ORDER — BISACODYL 5 MG PO TBEC: 5.0000 mg | DELAYED_RELEASE_TABLET | Freq: Every day | ORAL | Status: DC | PRN

## 2013-01-26 MED ORDER — PROPOFOL 10 MG/ML IV BOLUS
INTRAVENOUS | Status: DC | PRN
  Administered 2013-01-26: 30 mg via INTRAVENOUS
  Administered 2013-01-26: 150 mg via INTRAVENOUS

## 2013-01-26 MED ORDER — FENTANYL CITRATE 0.05 MG/ML IJ SOLN
INTRAMUSCULAR | Status: DC | PRN
  Administered 2013-01-26 (×4): 25 ug via INTRAVENOUS

## 2013-01-26 MED ORDER — MISOPROSTOL 200 MCG PO TABS
200.0000 ug | ORAL_TABLET | Freq: Every day | ORAL | Status: DC
  Filled 2013-01-26 (×2): qty 1

## 2013-01-26 MED ORDER — PHENOL 1.4 % MT LIQD: 1.0000 | OROMUCOSAL | Status: DC | PRN

## 2013-01-26 MED ORDER — ACETAMINOPHEN 160 MG/5ML PO SOLN
325.0000 mg | ORAL | Status: DC | PRN
  Filled 2013-01-26: qty 20.3

## 2013-01-26 MED ORDER — WARFARIN SODIUM 5 MG PO TABS
5.0000 mg | ORAL_TABLET | Freq: Once | ORAL | Status: AC
  Administered 2013-01-26: 5 mg via ORAL
  Filled 2013-01-26: qty 1

## 2013-01-26 MED ORDER — MENTHOL 3 MG MT LOZG: 1.0000 | LOZENGE | OROMUCOSAL | Status: DC | PRN

## 2013-01-26 MED ORDER — CEFAZOLIN SODIUM-DEXTROSE 2-3 GM-% IV SOLR
2.0000 g | Freq: Four times a day (QID) | INTRAVENOUS | Status: AC
  Administered 2013-01-26 (×2): 2 g via INTRAVENOUS
  Filled 2013-01-26 (×3): qty 50

## 2013-01-26 MED ORDER — LACTATED RINGERS IV SOLN
INTRAVENOUS | Status: DC | PRN
  Administered 2013-01-26 (×2): via INTRAVENOUS

## 2013-01-26 MED ORDER — LOSARTAN POTASSIUM-HCTZ 100-25 MG PO TABS: 1.0000 | ORAL_TABLET | Freq: Every day | ORAL | Status: DC

## 2013-01-26 MED ORDER — METOPROLOL TARTRATE 12.5 MG HALF TABLET
12.5000 mg | ORAL_TABLET | Freq: Two times a day (BID) | ORAL | Status: DC
  Administered 2013-01-26 – 2013-01-28 (×4): 12.5 mg via ORAL
  Filled 2013-01-26 (×5): qty 1

## 2013-01-26 MED ORDER — DOCUSATE SODIUM 100 MG PO CAPS
100.0000 mg | ORAL_CAPSULE | Freq: Two times a day (BID) | ORAL | Status: DC
  Administered 2013-01-26 – 2013-01-28 (×4): 100 mg via ORAL
  Filled 2013-01-26 (×5): qty 1

## 2013-01-26 MED ORDER — BUPIVACAINE-EPINEPHRINE 0.5% -1:200000 IJ SOLN
INTRAMUSCULAR | Status: DC | PRN
  Administered 2013-01-26: 30 mL

## 2013-01-26 MED ORDER — KETOROLAC TROMETHAMINE 30 MG/ML IJ SOLN
INTRAMUSCULAR | Status: AC
  Administered 2013-01-26: 30 mg via INTRAVENOUS
  Filled 2013-01-26: qty 1

## 2013-01-26 MED ORDER — ONDANSETRON HCL 4 MG PO TABS: 4.0000 mg | ORAL_TABLET | Freq: Four times a day (QID) | ORAL | Status: DC | PRN

## 2013-01-26 MED ORDER — METOCLOPRAMIDE HCL 5 MG/ML IJ SOLN
5.0000 mg | Freq: Three times a day (TID) | INTRAMUSCULAR | Status: DC | PRN
  Administered 2013-01-26 – 2013-01-27 (×2): 10 mg via INTRAVENOUS
  Filled 2013-01-26: qty 2

## 2013-01-26 MED ORDER — LIDOCAINE HCL (PF) 2 % IJ SOLN
INTRAMUSCULAR | Status: DC | PRN
  Administered 2013-01-26: 70 mg via INTRADERMAL

## 2013-01-26 MED ORDER — ONDANSETRON HCL 4 MG/2ML IJ SOLN
4.0000 mg | Freq: Four times a day (QID) | INTRAMUSCULAR | Status: DC | PRN
  Administered 2013-01-26: 4 mg via INTRAVENOUS
  Filled 2013-01-26: qty 2

## 2013-01-26 MED ORDER — HYDROCHLOROTHIAZIDE 25 MG PO TABS
25.0000 mg | ORAL_TABLET | Freq: Every day | ORAL | Status: DC
  Administered 2013-01-27 – 2013-01-28 (×2): 25 mg via ORAL
  Filled 2013-01-26 (×3): qty 1

## 2013-01-26 MED ORDER — MIDAZOLAM HCL 2 MG/2ML IJ SOLN
INTRAMUSCULAR | Status: AC
  Administered 2013-01-26: 2 mg
  Filled 2013-01-26: qty 2

## 2013-01-26 MED ORDER — ENOXAPARIN SODIUM 30 MG/0.3ML ~~LOC~~ SOLN
30.0000 mg | Freq: Two times a day (BID) | SUBCUTANEOUS | Status: DC
  Administered 2013-01-27 – 2013-01-28 (×3): 30 mg via SUBCUTANEOUS
  Filled 2013-01-26 (×6): qty 0.3

## 2013-01-26 MED ORDER — SODIUM CHLORIDE 0.9 % IV SOLN
INTRAVENOUS | Status: DC | PRN
  Administered 2013-01-26: 11:00:00 via INTRAVENOUS

## 2013-01-26 MED ORDER — LOSARTAN POTASSIUM 50 MG PO TABS
100.0000 mg | ORAL_TABLET | Freq: Every day | ORAL | Status: DC
  Administered 2013-01-27 – 2013-01-28 (×2): 100 mg via ORAL
  Filled 2013-01-26 (×3): qty 2

## 2013-01-26 MED ORDER — METHOCARBAMOL 100 MG/ML IJ SOLN
500.0000 mg | Freq: Four times a day (QID) | INTRAVENOUS | Status: DC | PRN
  Filled 2013-01-26 (×2): qty 5

## 2013-01-26 MED ORDER — KETOROLAC TROMETHAMINE 30 MG/ML IJ SOLN
15.0000 mg | Freq: Once | INTRAMUSCULAR | Status: AC | PRN
  Administered 2013-01-26: 30 mg via INTRAVENOUS

## 2013-01-26 MED ORDER — BUPIVACAINE LIPOSOME 1.3 % IJ SUSP
INTRAMUSCULAR | Status: DC | PRN
  Administered 2013-01-26: 20 mL

## 2013-01-26 MED ORDER — ACETAMINOPHEN 325 MG PO TABS: 325.0000 mg | ORAL_TABLET | ORAL | Status: DC | PRN

## 2013-01-26 MED ORDER — BUPIVACAINE-EPINEPHRINE (PF) 0.5% -1:200000 IJ SOLN
INTRAMUSCULAR | Status: AC
  Filled 2013-01-26: qty 10

## 2013-01-26 MED ORDER — ZOLPIDEM TARTRATE 5 MG PO TABS
5.0000 mg | ORAL_TABLET | Freq: Every evening | ORAL | Status: DC | PRN
  Administered 2013-01-26: 5 mg via ORAL
  Filled 2013-01-26: qty 1

## 2013-01-26 MED ORDER — ACETAMINOPHEN 325 MG PO TABS
650.0000 mg | ORAL_TABLET | Freq: Four times a day (QID) | ORAL | Status: DC | PRN
  Administered 2013-01-27: 650 mg via ORAL
  Filled 2013-01-26: qty 2

## 2013-01-26 MED ORDER — DIPHENHYDRAMINE HCL 12.5 MG/5ML PO ELIX: 12.5000 mg | ORAL_SOLUTION | ORAL | Status: DC | PRN

## 2013-01-26 MED ORDER — POTASSIUM CHLORIDE IN NACL 20-0.9 MEQ/L-% IV SOLN
INTRAVENOUS | Status: DC
  Administered 2013-01-26 – 2013-01-27 (×3): via INTRAVENOUS
  Filled 2013-01-26 (×5): qty 1000

## 2013-01-26 MED ORDER — WARFARIN - PHARMACIST DOSING INPATIENT
Freq: Every day | Status: DC
  Administered 2013-01-27: 18:00:00

## 2013-01-26 MED ORDER — BUPIVACAINE LIPOSOME 1.3 % IJ SUSP
20.0000 mL | Freq: Once | INTRAMUSCULAR | Status: DC
  Filled 2013-01-26: qty 20

## 2013-01-26 MED ORDER — LACTATED RINGERS IV SOLN
INTRAVENOUS | Status: DC
  Administered 2013-01-26: 50 mL/h via INTRAVENOUS

## 2013-01-26 MED ORDER — HYDROMORPHONE HCL PF 1 MG/ML IJ SOLN
1.0000 mg | INTRAMUSCULAR | Status: DC | PRN
  Filled 2013-01-26: qty 1

## 2013-01-26 MED ORDER — SIMVASTATIN 10 MG PO TABS
10.0000 mg | ORAL_TABLET | Freq: Every day | ORAL | Status: DC
  Administered 2013-01-26 – 2013-01-27 (×2): 10 mg via ORAL
  Filled 2013-01-26 (×3): qty 1

## 2013-01-26 MED ORDER — GABAPENTIN 300 MG PO CAPS
300.0000 mg | ORAL_CAPSULE | Freq: Every day | ORAL | Status: DC
  Administered 2013-01-26 – 2013-01-27 (×2): 300 mg via ORAL
  Filled 2013-01-26 (×3): qty 1

## 2013-01-26 MED ORDER — FLEET ENEMA 7-19 GM/118ML RE ENEM: 1.0000 | ENEMA | Freq: Once | RECTAL | Status: AC | PRN

## 2013-01-26 MED ORDER — SODIUM CHLORIDE 0.9 % IV SOLN: INTRAVENOUS | Status: DC

## 2013-01-26 MED ORDER — METOCLOPRAMIDE HCL 10 MG PO TABS: 5.0000 mg | ORAL_TABLET | Freq: Three times a day (TID) | ORAL | Status: DC | PRN

## 2013-01-26 MED ORDER — POTASSIUM CHLORIDE CRYS ER 10 MEQ PO TBCR
10.0000 meq | EXTENDED_RELEASE_TABLET | Freq: Every day | ORAL | Status: DC
  Administered 2013-01-27 – 2013-01-28 (×2): 10 meq via ORAL
  Filled 2013-01-26 (×3): qty 1

## 2013-01-26 MED ORDER — METHOCARBAMOL 500 MG PO TABS
500.0000 mg | ORAL_TABLET | Freq: Four times a day (QID) | ORAL | Status: DC | PRN
  Administered 2013-01-27 (×2): 500 mg via ORAL
  Filled 2013-01-26 (×2): qty 1

## 2013-01-26 MED ORDER — HYDROMORPHONE HCL 2 MG PO TABS
2.0000 mg | ORAL_TABLET | ORAL | Status: DC | PRN
  Filled 2013-01-26: qty 1

## 2013-01-26 MED ORDER — CELECOXIB 200 MG PO CAPS
200.0000 mg | ORAL_CAPSULE | Freq: Two times a day (BID) | ORAL | Status: DC
  Administered 2013-01-27 – 2013-01-28 (×3): 200 mg via ORAL
  Filled 2013-01-26 (×6): qty 1

## 2013-01-26 MED ORDER — DEXTROSE 5 % IV SOLN
INTRAVENOUS | Status: DC | PRN
  Administered 2013-01-26: 10:00:00 via INTRAVENOUS

## 2013-01-26 MED ORDER — FENTANYL CITRATE 0.05 MG/ML IJ SOLN
INTRAMUSCULAR | Status: AC
  Administered 2013-01-26: 50 ug
  Filled 2013-01-26: qty 2

## 2013-01-26 SURGICAL SUPPLY — 56 items
BANDAGE ESMARK 6X9 LF (GAUZE/BANDAGES/DRESSINGS) ×1 IMPLANT
BLADE SAGITTAL 13X1.27X60 (BLADE) ×2 IMPLANT
BLADE SAW SGTL 83.5X18.5 (BLADE) ×2 IMPLANT
BNDG CMPR 9X6 STRL LF SNTH (GAUZE/BANDAGES/DRESSINGS) ×1
BNDG ESMARK 6X9 LF (GAUZE/BANDAGES/DRESSINGS) ×2
BOWL SMART MIX CTS (DISPOSABLE) ×2 IMPLANT
CAP KNEE CMT PERSONA ×1 IMPLANT
CEMENT BONE SIMPLEX SPEEDSET (Cement) ×4 IMPLANT
COVER SURGICAL LIGHT HANDLE (MISCELLANEOUS) ×2 IMPLANT
CUFF TOURNIQUET SINGLE 34IN LL (TOURNIQUET CUFF) ×2 IMPLANT
DRAPE EXTREMITY T 121X128X90 (DRAPE) ×2 IMPLANT
DRAPE INCISE IOBAN 66X45 STRL (DRAPES) ×4 IMPLANT
DRAPE PROXIMA HALF (DRAPES) ×2 IMPLANT
DRAPE U-SHAPE 47X51 STRL (DRAPES) ×2 IMPLANT
DRSG ADAPTIC 3X8 NADH LF (GAUZE/BANDAGES/DRESSINGS) ×2 IMPLANT
DRSG PAD ABDOMINAL 8X10 ST (GAUZE/BANDAGES/DRESSINGS) ×2 IMPLANT
DURAPREP 26ML APPLICATOR (WOUND CARE) ×4 IMPLANT
ELECT REM PT RETURN 9FT ADLT (ELECTROSURGICAL) ×2
ELECTRODE REM PT RTRN 9FT ADLT (ELECTROSURGICAL) ×1 IMPLANT
EVACUATOR 1/8 PVC DRAIN (DRAIN) ×2 IMPLANT
GLOVE BIOGEL M 7.0 STRL (GLOVE) ×1 IMPLANT
GLOVE BIOGEL PI IND STRL 7.5 (GLOVE) IMPLANT
GLOVE BIOGEL PI IND STRL 8.5 (GLOVE) ×2 IMPLANT
GLOVE BIOGEL PI INDICATOR 7.5 (GLOVE) ×1
GLOVE BIOGEL PI INDICATOR 8.5 (GLOVE) ×1
GLOVE SURG ORTHO 8.0 STRL STRW (GLOVE) ×4 IMPLANT
GOWN PREVENTION PLUS XLARGE (GOWN DISPOSABLE) ×4 IMPLANT
GOWN STRL NON-REIN LRG LVL3 (GOWN DISPOSABLE) ×4 IMPLANT
HANDPIECE INTERPULSE COAX TIP (DISPOSABLE) ×2
HOOD PEEL AWAY FACE SHEILD DIS (HOOD) ×8 IMPLANT
KIT BASIN OR (CUSTOM PROCEDURE TRAY) ×2 IMPLANT
KIT ROOM TURNOVER OR (KITS) ×2 IMPLANT
MANIFOLD NEPTUNE II (INSTRUMENTS) ×2 IMPLANT
NEEDLE 22X1 1/2 (OR ONLY) (NEEDLE) ×2 IMPLANT
NS IRRIG 1000ML POUR BTL (IV SOLUTION) ×2 IMPLANT
PACK TOTAL JOINT (CUSTOM PROCEDURE TRAY) ×2 IMPLANT
PAD ARMBOARD 7.5X6 YLW CONV (MISCELLANEOUS) ×4 IMPLANT
PADDING CAST COTTON 6X4 STRL (CAST SUPPLIES) ×2 IMPLANT
SET HNDPC FAN SPRY TIP SCT (DISPOSABLE) ×1 IMPLANT
SPONGE GAUZE 4X4 12PLY (GAUZE/BANDAGES/DRESSINGS) ×2 IMPLANT
SPONGE GAUZE 4X4 12PLY STER LF (GAUZE/BANDAGES/DRESSINGS) ×1 IMPLANT
STAPLER VISISTAT 35W (STAPLE) ×2 IMPLANT
SUCTION FRAZIER TIP 10 FR DISP (SUCTIONS) ×2 IMPLANT
SUT BONE WAX W31G (SUTURE) ×2 IMPLANT
SUT VIC AB 0 CT1 27 (SUTURE) ×2
SUT VIC AB 0 CT1 27XBRD ANBCTR (SUTURE) IMPLANT
SUT VIC AB 0 CTB1 27 (SUTURE) ×4 IMPLANT
SUT VIC AB 1 CT1 27 (SUTURE) ×6
SUT VIC AB 1 CT1 27XBRD ANBCTR (SUTURE) ×2 IMPLANT
SUT VIC AB 2-0 CT1 27 (SUTURE) ×4
SUT VIC AB 2-0 CT1 TAPERPNT 27 (SUTURE) ×2 IMPLANT
SYR CONTROL 10ML LL (SYRINGE) ×2 IMPLANT
TOWEL OR 17X24 6PK STRL BLUE (TOWEL DISPOSABLE) ×2 IMPLANT
TOWEL OR 17X26 10 PK STRL BLUE (TOWEL DISPOSABLE) ×2 IMPLANT
TRAY FOLEY CATH 14FR (SET/KITS/TRAYS/PACK) ×2 IMPLANT
WATER STERILE IRR 1000ML POUR (IV SOLUTION) ×4 IMPLANT

## 2013-01-26 NOTE — Anesthesia Procedure Notes (Addendum)
Anesthesia Regional Block:  Femoral nerve block  Pre-Anesthetic Checklist: ,, timeout performed, Correct Patient, Correct Site, Correct Laterality, Correct Procedure, Correct Position, site marked, Risks and benefits discussed,  Surgical consent,  Pre-op evaluation,  At surgeon's request and post-op pain management  Laterality: Right  Prep: chloraprep       Needles:  Injection technique: Single-shot  Needle Type: Stimiplex          Additional Needles:  Procedures: ultrasound guided (picture in chart) Femoral nerve block Narrative:  Start time: 01/26/2013 9:13 AM End time: 01/26/2013 9:23 AM Injection made incrementally with aspirations every 5 mL.  Performed by: Personally  Anesthesiologist: Moser   Procedure Name: LMA Insertion Date/Time: 01/26/2013 10:25 AM Performed by: Williemae Area B Pre-anesthesia Checklist: Patient identified, Emergency Drugs available, Suction available and Patient being monitored Patient Re-evaluated:Patient Re-evaluated prior to inductionOxygen Delivery Method: Circle system utilized Preoxygenation: Pre-oxygenation with 100% oxygen Intubation Type: IV induction Ventilation: Mask ventilation without difficulty LMA: LMA inserted LMA Size: 4.0 Number of attempts: 1 Placement Confirmation: breath sounds checked- equal and bilateral and positive ETCO2 Tube secured with: taped across cheeks; gauze roll b/t teeth. Dental Injury: Teeth and Oropharynx as per pre-operative assessment

## 2013-01-26 NOTE — Progress Notes (Signed)
ANTICOAGULATION CONSULT NOTE - Initial Consult  Pharmacy Consult for Coumadin Indication: atrial fibrillation and VTE prophylaxis  Allergies  Allergen Reactions  . Oxycodone Nausea And Vomiting    Full body weakness   . Percocet [Oxycodone-Acetaminophen] Itching  . Soma [Carisoprodol] Nausea And Vomiting  . Ultram [Tramadol Hcl] Nausea And Vomiting    Patient Measurements:    Vital Signs: Temp: 97.5 F (36.4 C) (01/19 1430) Temp src: Oral (01/19 0807) BP: 160/79 mmHg (01/19 1430) Pulse Rate: 73 (01/19 1430)  Labs:  Recent Labs  01/26/13 0807  APTT 28  LABPROT 12.9  INR 0.99    The CrCl is unknown because both a height and weight (above a minimum accepted value) are required for this calculation.   Medical History: Past Medical History  Diagnosis Date  . IBS (irritable bowel syndrome)   . Arthritis   . A-fib   . Hypertension 02/24/09    Echo-EF 62%; Myocardial Perfusion Study-Normal. No signifcant ischemia noted.  . Edema of lower extremity 06/29/09    Lower Venous Exam- normal. No evidence of thrombus or thrombophlebitis.  Marland Kitchen PONV (postoperative nausea and vomiting)   . Migraine   . GERD (gastroesophageal reflux disease)   . Hemorrhoids   . Frequent urination at night     Medications:  Prescriptions prior to admission  Medication Sig Dispense Refill  . aspirin 81 MG tablet Take 81 mg by mouth daily.        Marland Kitchen Bioflavonoid Products (ESTER C PO) Take by mouth. Patient taking 500 mg daily       . diclofenac (VOLTAREN) 75 MG EC tablet Take 75 mg by mouth 2 (two) times daily.      . furosemide (LASIX) 20 MG tablet Take 20 mg by mouth daily.        Marland Kitchen gabapentin (NEURONTIN) 300 MG capsule Take 300 mg by mouth at bedtime.      Marland Kitchen losartan-hydrochlorothiazide (HYZAAR) 100-25 MG per tablet Take 1 tablet by mouth daily.        . magnesium oxide (MAG-OX) 400 MG tablet Take 800 mg by mouth every evening.      . metoprolol tartrate (LOPRESSOR) 25 MG tablet Take 12.5 mg  by mouth 2 (two) times daily. Patient takes 1/2 in am and 1/2 in pm      . misoprostol (CYTOTEC) 200 MCG tablet Take 200 mcg by mouth daily.      . Multiple Vitamins-Minerals (CENTRUM ULTRA WOMENS PO) Take by mouth.        . potassium chloride SA (K-DUR,KLOR-CON) 20 MEQ tablet Take 10 mEq by mouth daily.      . simvastatin (ZOCOR) 10 MG tablet Take 10 mg by mouth at bedtime.        . triamcinolone cream (KENALOG) 0.1 % Apply 1 application topically 2 (two) times daily.      Marland Kitchen warfarin (COUMADIN) 5 MG tablet Take 2.5-5 mg by mouth daily. Take 2.5mg  on Saturday. Take 5mg  the rest of the week      . cycloSPORINE (RESTASIS) 0.05 % ophthalmic emulsion 1 drop 2 (two) times daily.          Assessment: 79 year old female s/p right knee TKA.  She is on chronic anticoagulation with Coumadin for atrial fibrillation with her last dose taken 1/13 in preparation for surgery.  She is now to resume anticoagulation for atrial fibrillation with the benefit of VTE prophylaxis as well.  Will not start above her usual Coumadin dose given she  just had surgery today.  Goal of Therapy:  INR 2-3   Plan:  Coumadin 5mg  today Daily PT/INR monitoring Lovenox 30mg  SQ q12h until INR >/= 1.8  Legrand Como, Pharm.D., BCPS, AAHIVP Clinical Pharmacist Phone: [phone] or [phone] 01/26/2013, 4:03 PM

## 2013-01-26 NOTE — Anesthesia Preprocedure Evaluation (Signed)
Anesthesia Evaluation  Patient identified by MRN, date of birth, ID band Patient awake    Reviewed: Allergy & Precautions, H&P , NPO status , Patient's Chart, lab work & pertinent test results, reviewed documented beta blocker date and time   History of Anesthesia Complications (+) PONV and history of anesthetic complications  Airway Mallampati: II TM Distance: >3 FB Neck ROM: Full    Dental  (+) Partial Upper and Teeth Intact   Pulmonary neg shortness of breath, neg COPDformer smoker,  breath sounds clear to auscultation        Cardiovascular Exercise Tolerance: Good METS: 5 - 7 Mets hypertension, Pt. on medications and Pt. on home beta blockers + Peripheral Vascular Disease + dysrhythmias Atrial Fibrillation - Valvular Problems/MurmursRhythm:Irregular Rate:Normal  Negative stress test   Neuro/Psych  Headaches, Left sciatica s/p diskectomy and multiple epidural injections negative psych ROS   GI/Hepatic Neg liver ROS, GERD-  Medicated and Controlled,  Endo/Other  negative endocrine ROS  Renal/GU negative Renal ROS     Musculoskeletal  (+) Arthritis -, Osteoarthritis,    Abdominal   Peds  Hematology Chronic anticoagulation (Warfarin) for afib   Anesthesia Other Findings   Reproductive/Obstetrics                           Anesthesia Physical Anesthesia Plan  ASA: II  Anesthesia Plan: General and Regional   Post-op Pain Management:    Induction: Intravenous  Airway Management Planned: LMA  Additional Equipment: None  Intra-op Plan:   Post-operative Plan: Extubation in OR  Informed Consent: I have reviewed the patients History and Physical, chart, labs and discussed the procedure including the risks, benefits and alternatives for the proposed anesthesia with the patient or authorized representative who has indicated his/her understanding and acceptance.   Dental advisory  given  Plan Discussed with: CRNA and Surgeon  Anesthesia Plan Comments:         Anesthesia Quick Evaluation

## 2013-01-26 NOTE — Transfer of Care (Signed)
Immediate Anesthesia Transfer of Care Note  Patient: Tara Cox  Procedure(s) Performed: Procedure(s): TOTAL KNEE ARTHROPLASTY (Right)  Patient Location: PACU  Anesthesia Type:General  Level of Consciousness: awake, alert  and patient cooperative  Airway & Oxygen Therapy: Patient Spontanous Breathing and Patient connected to nasal cannula oxygen  Post-op Assessment: Report given to PACU RN and Post -op Vital signs reviewed and stable  Post vital signs: Reviewed and stable  Complications: No apparent anesthesia complications

## 2013-01-26 NOTE — Progress Notes (Signed)
Report given to Teresa RN

## 2013-01-26 NOTE — Progress Notes (Signed)
Orthopedic Tech Progress Note Patient Details:  Tara Cox September 22, 1934 [id number]  CPM Right Knee CPM Right Knee: On Right Knee Flexion (Degrees): 90 Right Knee Extension (Degrees): 0 Additional Comments: pt in CPM at [place], Cathy Crounse T 01/26/2013, 3:14 PM

## 2013-01-26 NOTE — Preoperative (Signed)
Beta Blockers   Reason not to administer Beta Blockers:Lopressor 5 a.m. today

## 2013-01-26 NOTE — Interval H&P Note (Signed)
History and Physical Interval Note:  01/26/2013 6:50 AM  Tara Cox  has presented today for surgery, with the diagnosis of osteoarthritis right knee  The various methods of treatment have been discussed with the patient and family. After consideration of risks, benefits and other options for treatment, the patient has consented to  Procedure(s): TOTAL KNEE ARTHROPLASTY (Right) as a surgical intervention .  The patient's history has been reviewed, patient examined, no change in status, stable for surgery.  I have reviewed the patient's chart and labs.  Questions were answered to the patient's satisfaction.     Raylea Adcox,STEPHEN D

## 2013-01-26 NOTE — Progress Notes (Signed)
Utilization review completed.  

## 2013-01-26 NOTE — Anesthesia Postprocedure Evaluation (Signed)
  Anesthesia Post-op Note  Patient: Tara Cox  Procedure(s) Performed: Procedure(s): TOTAL KNEE ARTHROPLASTY (Right)  Patient Location: PACU  Anesthesia Type:General and Regional  Level of Consciousness: awake, alert  and oriented  Airway and Oxygen Therapy: Patient Spontanous Breathing  Post-op Pain: mild  Post-op Assessment: Post-op Vital signs reviewed, Patient's Cardiovascular Status Stable, Respiratory Function Stable, Patent Airway, No signs of Nausea or vomiting and Pain level controlled  Post-op Vital Signs: Reviewed and stable  Complications: No apparent anesthesia complications

## 2013-01-26 NOTE — Progress Notes (Signed)
Orthopedic Tech Progress Note Patient Details:  Tara Cox Sep 24, 1934 [id number] Off cpm at 8:00 pm  Patient ID: Tara Cox, female   DOB: 05/25/1934, 79 y.o.   MRN: [id number]   Tara Cox 01/26/2013, 8:00 PM

## 2013-01-26 NOTE — Op Note (Signed)
TOTAL KNEE REPLACEMENT OPERATIVE NOTE:  01/26/2013  2:20 PM  PATIENT:  Tara Cox  79 y.o. female  PRE-OPERATIVE DIAGNOSIS:  osteoarthritis right knee  POST-OPERATIVE DIAGNOSIS:  osteoarthritis right knee  PROCEDURE:  Procedure(s): TOTAL KNEE ARTHROPLASTY  SURGEON:  Surgeon(s): Vickey Huger, MD  PHYSICIAN ASSISTANT: Carlynn Spry, [facility]  ANESTHESIA:   general  DRAINS: Hemovac  SPECIMEN: None  COUNTS:  Correct  TOURNIQUET:   Total Tourniquet Time Documented: Thigh (Right) - 49 minutes Total: Thigh (Right) - 49 minutes   DICTATION:  Indication for procedure:    The patient is a 79 y.o. female who has failed conservative treatment for osteoarthritis right knee.  Informed consent was obtained prior to anesthesia. The risks versus benefits of the operation were explain and in a way the patient can, and did, understand.   On the implant demand matching protocol, this patient scored 9.  Therefore, this patient was not receive a polyethylene insert with vitamin E which is a high demand implant.  Description of procedure:     The patient was taken to the operating room and placed under anesthesia.  The patient was positioned in the usual fashion taking care that all body parts were adequately padded and/or protected.  I foley catheter was not placed.  A tourniquet was applied and the leg prepped and draped in the usual sterile fashion.  The extremity was exsanguinated with the esmarch and tourniquet inflated to 350 mmHg.  Pre-operative range of motion was normal.  The knee was in 5 degree of mild valgus.  A midline incision approximately 6-7 inches long was made with a #10 blade.  A new blade was used to make a parapatellar arthrotomy going 2-3 cm into the quadriceps tendon, over the patella, and alongside the medial aspect of the patellar tendon.  A synovectomy was then performed with the #10 blade and forceps. I then elevated the deep MCL off the medial tibial metaphysis  subperiosteally around to the semimembranosus attachment.    I everted the patella and used calipers to measure patellar thickness.  I used the reamer to ream down to appropriate thickness to recreate the native thickness.  I then removed excess bone with the rongeur and sagittal saw.  I used the appropriately sized template and drilled the three lug holes.  I then put the trial in place and measured the thickness with the calipers to ensure recreation of the native thickness.  The trial was then removed and the patella subluxed and the knee brought into flexion.  A homan retractor was place to retract and protect the patella and lateral structures.  A Z-retractor was place medially to protect the medial structures.  The extra-medullary alignment system was used to make cut the tibial articular surface perpendicular to the anamotic axis of the tibia and in 3 degrees of posterior slope.  The cut surface and alignment jig was removed.  I then used the intramedullary alignment guide to make a 4 valgus cut on the distal femur.  I then marked out the epicondylar axis on the distal femur.  The posterior condylar axis measured 5 degrees.  I then used the anterior referencing sizer and measured the femur to be a size 7.  The 4-In-1 cutting block was screwed into place in external rotation matching the posterior condylar angle, making our cuts perpendicular to the epicondylar axis.  Anterior, posterior and chamfer cuts were made with the sagittal saw.  The cutting block and cut pieces were removed.  A  lamina spreader was placed in 90 degrees of flexion.  The ACL, PCL, menisci, and posterior condylar osteophytes were removed.  A 10 mm spacer blocked was found to offer good flexion and extension gap balance after mild in degree releasing.   The scoop retractor was then placed and the femoral finishing block was pinned in place.  The small sagittal saw was used as well as the lug drill to finish the femur.  The block  and cut surfaces were removed and the medullary canal hole filled with autograft bone from the cut pieces.  The tibia was delivered forward in deep flexion and external rotation.  A size D tray was selected and pinned into place centered on the medial 1/3 of the tibial tubercle.  The reamer and keel was used to prepare the tibia through the tray.    I then trialed with the size 7 femur, size D tibia, a 10 mm insert and the 32 patella.  I had excellent flexion/extension gap balance, excellent patella tracking.  Flexion was full and beyond 120 degrees; extension was zero.  These components were chosen and the staff opened them to me on the back table while the knee was lavaged copiously and the cement mixed.  The soft tissue was infiltrated with 60cc of exparel 1.3% through a 21 gauge needle.  I cemented in the components and removed all excess cement.  The polyethylene tibial component was snapped into place and the knee placed in extension while cement was hardening.  The capsule was infilltrated with 30cc of .25% Marcaine with epinephrine.  A hemovac was place in the joint exiting superolaterally.  A pain pump was place superomedially superficial to the arthrotomy.  Once the cement was hard, the tourniquet was let down.  Hemostasis was obtained.  The arthrotomy was closed with figure-8 #1 vicryl sutures.  The deep soft tissues were closed with #0 vicryls and the subcuticular layer closed with a running #2-0 vicryl.  The skin was reapproximated and closed with skin staples.  The wound was dressed with xeroform, 4 x4's, 2 ABD sponges, a single layer of webril and a TED stocking.   The patient was then awakened, extubated, and taken to the recovery room in stable condition.  BLOOD LOSS:  300cc DRAINS: 1 hemovac, 1 pain catheter COMPLICATIONS:  None.  PLAN OF CARE: Admit to inpatient   PATIENT DISPOSITION:  PACU - hemodynamically stable.   Delay start of Pharmacological VTE agent (>24hrs) due to  surgical blood loss or risk of bleeding:  not applicable  Please fax a copy of this op note to my office at [phone] (please only include page 1 and 2 of the Case Information op note)

## 2013-01-27 ENCOUNTER — Encounter (HOSPITAL_COMMUNITY): Payer: Self-pay | Admitting: General Practice

## 2013-01-27 LAB — BASIC METABOLIC PANEL
BUN: 15 mg/dL (ref 6–23)
CALCIUM: 8.5 mg/dL (ref 8.4–10.5)
CO2: 28 mEq/L (ref 19–32)
CREATININE: 0.71 mg/dL (ref 0.50–1.10)
Chloride: 104 mEq/L (ref 96–112)
GFR calc non Af Amer: 82 mL/min — ABNORMAL LOW (ref >90)
Glucose, Bld: 106 mg/dL — ABNORMAL HIGH (ref 70–99)
Potassium: 4.2 mEq/L (ref 3.7–5.3)
SODIUM: 143 meq/L (ref 137–147)

## 2013-01-27 LAB — PROTIME-INR
INR: 1.13 (ref 0.00–1.49)
PROTHROMBIN TIME: 14.3 s (ref 11.6–15.2)

## 2013-01-27 LAB — CBC
HCT: 35.6 % — ABNORMAL LOW (ref 36.0–46.0)
Hemoglobin: 11.5 g/dL — ABNORMAL LOW (ref 12.0–15.0)
MCH: 26.9 pg (ref 26.0–34.0)
MCHC: 32.3 g/dL (ref 30.0–36.0)
MCV: 83.2 fL (ref 78.0–100.0)
PLATELETS: 182 10*3/uL (ref 150–400)
RBC: 4.28 MIL/uL (ref 3.87–5.11)
RDW: 14.4 % (ref 11.5–15.5)
WBC: 11.1 10*3/uL — AB (ref 4.0–10.5)

## 2013-01-27 MED ORDER — MEPERIDINE HCL 50 MG PO TABS
50.0000 mg | ORAL_TABLET | ORAL | Status: DC | PRN
  Administered 2013-01-27 – 2013-01-28 (×5): 50 mg via ORAL
  Filled 2013-01-27 (×7): qty 1

## 2013-01-27 MED ORDER — WARFARIN SODIUM 5 MG PO TABS
5.0000 mg | ORAL_TABLET | Freq: Once | ORAL | Status: AC
  Administered 2013-01-27: 5 mg via ORAL
  Filled 2013-01-27: qty 1

## 2013-01-27 MED ORDER — MORPHINE SULFATE 2 MG/ML IJ SOLN
1.0000 mg | INTRAMUSCULAR | Status: DC | PRN
  Administered 2013-01-27: 1 mg via INTRAVENOUS
  Filled 2013-01-27: qty 1

## 2013-01-27 NOTE — Evaluation (Signed)
Occupational Therapy Evaluation Patient Details Name: Tara Cox MRN: [id number] DOB: 08-Apr-1934 Today's Date: 01/27/2013 Time: 4098-1191 OT Time Calculation (min): 24 min  OT Assessment / Plan / Recommendation History of present illness Pt is a 79 y/o female admitted s/p R TKA.   Clinical Impression   Pt presents with below problem list. Pt independent with ADLs, PTA. Feel pt will benefit from acute OT to increase independence prior to d/c.     OT Assessment  Patient needs continued OT Services    Follow Up Recommendations  No OT follow up;Supervision - Intermittent    Barriers to Discharge      Equipment Recommendations  Other (comment) (bigger size 3 in 1)    Recommendations for Other Services    Frequency  Min 2X/week    Precautions / Restrictions Precautions Precautions: Fall;Knee Precaution Booklet Issued: No Precaution Comments: Reviewed precautions with pt Restrictions Weight Bearing Restrictions: Yes RLE Weight Bearing: Weight bearing as tolerated   Pertinent Vitals/Pain Pain 8-9/10 Right knee/hip. Increased activity during session. Repositioned.     ADL  Grooming: Min guard;Wash/dry hands Where Assessed - Grooming: Unsupported standing Upper Body Dressing: Set up Where Assessed - Upper Body Dressing: Supported sitting Lower Body Dressing: Minimal assistance Where Assessed - Lower Body Dressing: Supported sit to Lobbyist: Magazine features editor Method: Sit to Loss adjuster, chartered: Raised toilet seat with arms (or 3-in-1 over toilet) Toileting - Clothing Manipulation and Hygiene: Min guard Where Assessed - Toileting Clothing Manipulation and Hygiene: Standing;Sit on 3-in-1 or toilet (hygiene-sitting and clothing-standing) Tub/Shower Transfer Method: Not assessed Equipment Used: Gait belt;Rolling walker Transfers/Ambulation Related to ADLs: Assisted minimally with walker when going to sink for grooming; Min guard for  transfers. ADL Comments: Educated on shower transfer techniques. Discussed use of bag on walker to carry items. Educated on dressing technique and recommended standing in front of chair/bed with walker in front when pulling up LB clothing over buttocks. Explained benefit of reaching down to don/doff right sock/shoe as it increases ROM in knee.    OT Diagnosis: Acute pain  OT Problem List: Decreased strength;Decreased range of motion;Decreased activity tolerance;Impaired balance (sitting and/or standing);Decreased knowledge of use of DME or AE;Decreased knowledge of precautions;Pain OT Treatment Interventions: Self-care/ADL training;DME and/or AE instruction;Therapeutic activities;Patient/family education;Balance training   OT Goals(Current goals can be found in the care plan section) Acute Rehab OT Goals Patient Stated Goal: not stated OT Goal Formulation: With patient Time For Goal Achievement: 02/03/13 Potential to Achieve Goals: Good ADL Goals Pt Will Perform Lower Body Dressing: with modified independence;sit to/from stand Pt Will Transfer to Toilet: with modified independence;ambulating (3 in 1 over commode) Pt Will Perform Toileting - Clothing Manipulation and hygiene: with modified independence;sit to/from stand Pt Will Perform Tub/Shower Transfer: Shower transfer;with supervision;ambulating;rolling walker (shower equipment tbd)  Visit Information  Last OT Received On: 01/27/13 Assistance Needed: +1 History of Present Illness: Pt is a 79 y/o female admitted s/p R TKA.       Prior [place] expects to be discharged to:: Private residence Living Arrangements: Spouse/significant other Available Help at Discharge: Family;Available 24 hours/day Type of Home: House (townhouse) Home Access: Level entry Home Layout: Two level;Able to live on main level with bedroom/bathroom Home Equipment: Gilford Rile - 2 wheels;Bedside commode Prior Function Level of  Independence: Independent Communication Communication: No difficulties Dominant Hand: Right         Vision/Perception     Cognition  Cognition Arousal/Alertness:  Awake/alert Behavior During Therapy: WFL for tasks assessed/performed Overall Cognitive Status: Within Functional Limits for tasks assessed    Extremity/Trunk Assessment Upper Extremity Assessment Upper Extremity Assessment: Overall WFL for tasks assessed Lower Extremity Assessment Lower Extremity Assessment: Defer to PT evaluation     Mobility Bed Mobility Overal bed mobility: Needs Assistance Bed Mobility: Supine to Sit;Sit to Supine Supine to sit: Min assist Sit to supine: Min assist General bed mobility comments: Assistance with RLE. Told pt she could hook other foot around ankle to assist RLE. Transfers Overall transfer level: Needs assistance Equipment used: Rolling walker (2 wheeled) Transfers: Sit to/from Stand Sit to Stand: Min guard General transfer comment: Cues for technique.        Balance   End of Session OT - End of Session Equipment Utilized During Treatment: Gait belt;Rolling walker Activity Tolerance: Patient tolerated treatment well Patient left: in bed;with call bell/phone within reach;with family/visitor present CPM Right Knee CPM Right Knee: Off  Lueders, Schlusser L OTR/L [phone] 01/27/2013, 6:09 PM

## 2013-01-27 NOTE — Plan of Care (Signed)
Problem: Consults Goal: Diagnosis- Total Joint Replacement Outcome: Completed/Met Date Met:  01/27/13 Primary Total Knee Right

## 2013-01-27 NOTE — Care Management Note (Signed)
CARE MANAGEMENT NOTE 01/27/2013  Patient:  Tara Cox, Tara Cox   Account Number:  [id number]  Date Initiated:  01/27/2013  Documentation initiated by:  Ricki Miller  Subjective/Objective Assessment:   79 yr old female s/p right total knee arthroplasty.     Action/Plan:   Case manager spoke with patient and husband concerning home health and DME needs. Patient preoperatively setup with [facility], no changes. Rolling walker, 3in1 and CPM have been delivered.   Anticipated DC Date:  01/28/2013   Anticipated DC Plan:  [place]  CM consult      [facility] Choice  HOME HEALTH  DURABLE MEDICAL EQUIPMENT   Choice offered to / List presented to:        DME agency  [employer]     [place] arranged  HH-1 RN  HH-2 PT      [facility] agency  [facility]   Status of service:  Completed, signed off Medicare Important Message given?   (If response is "NO", the following Medicare IM given date fields will be blank) Date Medicare IM given:   Date Additional Medicare IM given:    Discharge Disposition:  [place]

## 2013-01-27 NOTE — Progress Notes (Signed)
ANTICOAGULATION CONSULT NOTE - [place] for Coumadin Indication: atrial fibrillation and VTE prophylaxis  Allergies  Allergen Reactions  . Oxycodone Nausea And Vomiting    Full body weakness   . Percocet [Oxycodone-Acetaminophen] Itching    Pt says she can take tylenol   . Soma [Carisoprodol] Nausea And Vomiting  . Ultram [Tramadol Hcl] Nausea And Vomiting    Patient Measurements:    Vital Signs: Temp: 98.7 F (37.1 C) (01/20 0452) BP: 150/52 mmHg (01/20 0452) Pulse Rate: 103 (01/20 0452)  Labs:  Recent Labs  01/26/13 0807 01/26/13 2145 01/27/13 0440  HGB  --  12.9 11.5*  HCT  --  39.7 35.6*  PLT  --  147* 182  APTT 28  --   --   LABPROT 12.9  --  14.3  INR 0.99  --  1.13  CREATININE  --  0.72 0.71     Medications:  Prescriptions prior to admission  Medication Sig Dispense Refill  . aspirin 81 MG tablet Take 81 mg by mouth daily.        Marland Kitchen Bioflavonoid Products (ESTER C PO) Take by mouth. Patient taking 500 mg daily       . diclofenac (VOLTAREN) 75 MG EC tablet Take 75 mg by mouth 2 (two) times daily.      . furosemide (LASIX) 20 MG tablet Take 20 mg by mouth daily.        Marland Kitchen gabapentin (NEURONTIN) 300 MG capsule Take 300 mg by mouth at bedtime.      Marland Kitchen losartan-hydrochlorothiazide (HYZAAR) 100-25 MG per tablet Take 1 tablet by mouth daily.        . magnesium oxide (MAG-OX) 400 MG tablet Take 800 mg by mouth every evening.      . metoprolol tartrate (LOPRESSOR) 25 MG tablet Take 12.5 mg by mouth 2 (two) times daily. Patient takes 1/2 in am and 1/2 in pm      . misoprostol (CYTOTEC) 200 MCG tablet Take 200 mcg by mouth daily.      . Multiple Vitamins-Minerals (CENTRUM ULTRA WOMENS PO) Take by mouth.        . potassium chloride SA (K-DUR,KLOR-CON) 20 MEQ tablet Take 10 mEq by mouth daily.      . simvastatin (ZOCOR) 10 MG tablet Take 10 mg by mouth at bedtime.        . triamcinolone cream (KENALOG) 0.1 % Apply 1 application topically 2  (two) times daily.      Marland Kitchen warfarin (COUMADIN) 5 MG tablet Take 2.5-5 mg by mouth daily. Take 2.5mg  on Saturday. Take 5mg  the rest of the week      . cycloSPORINE (RESTASIS) 0.05 % ophthalmic emulsion 1 drop 2 (two) times daily.          Assessment: 79 year old female POD #1 s/p right knee TKA.  She is on chronic anticoagulation with Coumadin for atrial fibrillation with her last dose taken 1/13 in preparation for surgery.  INR 1.13.  Pt continues on Lovenox as well.  Goal of Therapy:  INR 2-3   Plan:  Repeat Coumadin 5mg  today Daily PT/INR monitoring Lovenox 30mg  SQ q12h until INR >/= 1.8  [employer], Pharm.D., BCPS Clinical Pharmacist Pager [phone] 01/27/2013 12:12 PM

## 2013-01-27 NOTE — Evaluation (Signed)
Physical Therapy Evaluation Patient Details Name: KYLE STANSELL MRN: [id number] DOB: 02/04/1934 Today's Date: 01/27/2013 Time: 7628-3151 PT Time Calculation (min): 34 min  PT Assessment / Plan / Recommendation History of Present Illness  Pt is a 80 y/o female admitted s/p R TKA.  Clinical Impression  This patient presents with acute pain and decreased functional independence following the above mentioned procedure. At the time of PT eval, pt became very nauseated and did not feel well during ambulation. Pt was seated in recliner with feet elevated and O2 donned. RN notified. Further exercise was deferred at this time. This patient is appropriate for skilled PT interventions to address functional limitations, improve safety and independence with functional mobility, and return to PLOF.     PT Assessment  Patient needs continued PT services    Follow Up Recommendations  Home health PT    Does the patient have the potential to tolerate intense rehabilitation      Barriers to Discharge        Equipment Recommendations  None recommended by PT    Recommendations for Other Services     Frequency 7X/week    Precautions / Restrictions Precautions Precautions: Fall;Knee Precaution Booklet Issued: No Precaution Comments: Discussed towel roll under heel and NO pillow under knee Restrictions Weight Bearing Restrictions: Yes RLE Weight Bearing: Weight bearing as tolerated   Pertinent Vitals/Pain 7/10 after ambulation, RN notified      Mobility  Bed Mobility Overal bed mobility: Needs Assistance Bed Mobility: Supine to Sit Supine to sit: Min assist General bed mobility comments: VC's for sequencing and technique. Assist for movement and support of RLE.  Transfers Overall transfer level: Needs assistance Equipment used: Rolling walker (2 wheeled) Transfers: Sit to/from Stand Sit to Stand: Min assist General transfer comment: VC's for hand placement on seated surface for safety.  Assist to come to full stand.  Ambulation/Gait Ambulation/Gait assistance: Min guard Ambulation Distance (Feet): 12 Feet Assistive device: Rolling walker (2 wheeled) Gait Pattern/deviations: Step-to pattern;Decreased stride length;Trunk flexed Gait velocity: Decreased Gait velocity interpretation: Below normal speed for age/gender General Gait Details: VC's for sequencing and safety awareness with the RW.     Exercises Total Joint Exercises Ankle Circles/Pumps: 10 reps Quad Sets: 10 reps   PT Diagnosis: Difficulty walking;Acute pain  PT Problem List: Decreased strength;Decreased range of motion;Decreased activity tolerance;Decreased balance;Decreased mobility;Decreased knowledge of use of DME;Decreased safety awareness;Decreased knowledge of precautions;Pain PT Treatment Interventions: DME instruction;Gait training;Stair training;Functional mobility training;Therapeutic activities;Therapeutic exercise;Neuromuscular re-education;Patient/family education     PT Goals(Current goals can be found in the care plan section) Acute Rehab PT Goals Patient Stated Goal: To return home PT Goal Formulation: With patient/family Time For Goal Achievement: 02/10/13 Potential to Achieve Goals: Good  Visit Information  Last PT Received On: 77/20/15 Assistance Needed: +1 History of Present Illness: Pt is a 79 y/o female admitted s/p R TKA.       Prior [place] expects to be discharged to:: Private residence Living Arrangements: Spouse/significant other Available Help at Discharge: Family;Available 24 hours/day Type of Home: House (Townhouse) Home Access: Level entry Home Layout: Two level;Able to live on main level with bedroom/bathroom Home Equipment: Gilford Rile - 2 wheels;Bedside commode Prior Function Level of Independence: Independent Communication Communication: No difficulties Dominant Hand: Right    Cognition  Cognition Arousal/Alertness:  Awake/alert Behavior During Therapy: WFL for tasks assessed/performed Overall Cognitive Status: Within Functional Limits for tasks assessed    Extremity/Trunk Assessment Upper Extremity Assessment Upper Extremity Assessment:  Defer to OT evaluation Lower Extremity Assessment Lower Extremity Assessment: RLE deficits/detail RLE Deficits / Details: Decreased strength and AROM consistent with TKA RLE: Unable to fully assess due to pain Cervical / Trunk Assessment Cervical / Trunk Assessment: Kyphotic   Balance Balance Overall balance assessment: No apparent balance deficits (not formally assessed)  End of Session PT - End of Session Equipment Utilized During Treatment: Gait belt Activity Tolerance: Other (comment) (Limited by nausea) Patient left: in chair;with call bell/phone within reach;with family/visitor present Nurse Communication: Mobility status;Other (comment) (Nausea) CPM Right Knee CPM Right Knee: Off  GP     Jolyn Lent 01/27/2013, 12:13 PM  Jolyn Lent, PT, DPT [phone]

## 2013-01-27 NOTE — Progress Notes (Signed)
Subjective: 1 Day Post-Op Procedure(s) (LRB): TOTAL KNEE ARTHROPLASTY (Right) Patient reports pain as 7 on 0-10 scale.   C/o nausea with all pain meds Objective: Vital signs in last 24 hours: Temp:  [97.5 F (36.4 C)-98.7 F (37.1 C)] 98.7 F (37.1 C) (01/20 0452) Pulse Rate:  [66-103] 103 (01/20 0452) Resp:  [6-22] 16 (01/20 0452) BP: (101-188)/(51-90) 150/52 mmHg (01/20 0452) SpO2:  [94 %-100 %] 100 % (01/20 0452)  Intake/Output from previous day: 01/19 0701 - 01/20 0700 In: 3101.7 [P.O.:240; I.V.:2811.7; IV Piggyback:50] Out: 750 [Urine:500; Drains:200; Blood:50] Intake/Output this shift:     Recent Labs  01/26/13 2145 01/27/13 0440  HGB 12.9 11.5*    Recent Labs  01/26/13 2145 01/27/13 0440  WBC 12.0* 11.1*  RBC 4.76 4.28  HCT 39.7 35.6*  PLT 147* 182    Recent Labs  01/26/13 2145 01/27/13 0440  NA  --  143  K  --  4.2  CL  --  104  CO2  --  28  BUN  --  15  CREATININE 0.72 0.71  GLUCOSE  --  106*  CALCIUM  --  8.5    Recent Labs  01/26/13 0807 01/27/13 0440  INR 0.99 1.13    Neurologically intact ABD soft Dorsiflexion/Plantar flexion intact Compartment soft  Assessment/Plan: 1 Day Post-Op Procedure(s) (LRB): TOTAL KNEE ARTHROPLASTY (Right) Advance diet Up with therapy D/C IV fluids Will recheck this pm for hopeful d/c this pm if PT progresses Will try iv morphine and oral demerol due to nausea with other pain meds  ROBERTS,JANE B 01/27/2013, 7:24 AM

## 2013-01-27 NOTE — Progress Notes (Signed)
Physical Therapy Treatment Patient Details Name: Tara Cox MRN: [id number] DOB: 01-21-1934 Today's Date: 01/27/2013 Time: 7654-6503 PT Time Calculation (min): 33 min  PT Assessment / Plan / Recommendation  History of Present Illness Pt is a 79 y/o female admitted s/p R TKA.   PT Comments   Pt progressing towards physical therapy goals. Pt seems somewhat unmotivated to participate in therapeutic exercise and ambulation, however does very well once she begins. Anticipate increased ambulation distance tomorrow, as she states she felt very sleepy today overall.   Follow Up Recommendations  Home health PT     Does the patient have the potential to tolerate intense rehabilitation     Barriers to Discharge        Equipment Recommendations  None recommended by PT    Recommendations for Other Services    Frequency 7X/week   Progress towards PT Goals Progress towards PT goals: Progressing toward goals  Plan Current plan remains appropriate    Precautions / Restrictions Precautions Precautions: Fall;Knee Precaution Booklet Issued: No Precaution Comments: Discussed towel roll under heel and NO pillow under knee Restrictions Weight Bearing Restrictions: Yes RLE Weight Bearing: Weight bearing as tolerated   Pertinent Vitals/Pain Pt reports 7/10 pain at end of session with foam roll under heel. Replaced with towel roll for comfort.     Mobility  Bed Mobility Overal bed mobility: Needs Assistance Bed Mobility: Supine to Sit Supine to sit: Min assist General bed mobility comments: VC's for sequencing and technique. Assist for movement and support of RLE.  Transfers Overall transfer level: Needs assistance Equipment used: Rolling walker (2 wheeled) Transfers: Sit to/from Stand Sit to Stand: Min guard General transfer comment: VC's for hand placement on seated surface for safety. Ambulation/Gait Ambulation/Gait assistance: Min guard Ambulation Distance (Feet): 45 Feet Assistive  device: Rolling walker (2 wheeled) Gait Pattern/deviations: Step-to pattern;Decreased stride length;Trunk flexed Gait velocity: Decreased Gait velocity interpretation: Below normal speed for age/gender General Gait Details: Ambulated in room per pt's request, as she felt too groggy from medication to ambulate out in the hallway. Main cueing for improved posture.     Exercises Total Joint Exercises Ankle Circles/Pumps: 10 reps Quad Sets: 10 reps Short Arc Quad: 10 reps Heel Slides: 10 reps Hip ABduction/ADduction: 10 reps Goniometric ROM: 8-82   PT Diagnosis:    PT Problem List:   PT Treatment Interventions:     PT Goals (current goals can now be found in the care plan section) Acute Rehab PT Goals Patient Stated Goal: To return home PT Goal Formulation: With patient/family Time For Goal Achievement: 02/10/13 Potential to Achieve Goals: Good  Visit Information  Last PT Received On: 01/27/13 Assistance Needed: +1 History of Present Illness: Pt is a 79 y/o female admitted s/p R TKA.    Subjective Data  Subjective: "I feel better than this morning." Patient Stated Goal: To return home   Cognition  Cognition Arousal/Alertness: Lethargic;Suspect due to medications Behavior During Therapy: [facility] for tasks assessed/performed Overall Cognitive Status: Within Functional Limits for tasks assessed    Balance  Balance Overall balance assessment: No apparent balance deficits (not formally assessed)  End of Session PT - End of Session Equipment Utilized During Treatment: Gait belt Activity Tolerance: Other (comment) (Limited by nausea) Patient left: with call bell/phone within reach;with family/visitor present;in bed Nurse Communication: Mobility status;Other (comment) (Nausea)   GP     Jolyn Lent 01/27/2013, 4:25 PM  Jolyn Lent, PT, DPT [phone]

## 2013-01-28 LAB — BASIC METABOLIC PANEL
BUN: 12 mg/dL (ref 6–23)
CALCIUM: 8.5 mg/dL (ref 8.4–10.5)
CO2: 27 mEq/L (ref 19–32)
Chloride: 103 mEq/L (ref 96–112)
Creatinine, Ser: 0.7 mg/dL (ref 0.50–1.10)
GFR calc non Af Amer: 82 mL/min — ABNORMAL LOW (ref >90)
Glucose, Bld: 100 mg/dL — ABNORMAL HIGH (ref 70–99)
POTASSIUM: 4.1 meq/L (ref 3.7–5.3)
SODIUM: 141 meq/L (ref 137–147)

## 2013-01-28 LAB — CBC
HCT: 33.3 % — ABNORMAL LOW (ref 36.0–46.0)
Hemoglobin: 10.7 g/dL — ABNORMAL LOW (ref 12.0–15.0)
MCH: 26.9 pg (ref 26.0–34.0)
MCHC: 32.1 g/dL (ref 30.0–36.0)
MCV: 83.7 fL (ref 78.0–100.0)
PLATELETS: 152 10*3/uL (ref 150–400)
RBC: 3.98 MIL/uL (ref 3.87–5.11)
RDW: 14.5 % (ref 11.5–15.5)
WBC: 12.1 10*3/uL — ABNORMAL HIGH (ref 4.0–10.5)

## 2013-01-28 LAB — PROTIME-INR
INR: 1.41 (ref 0.00–1.49)
PROTHROMBIN TIME: 16.9 s — AB (ref 11.6–15.2)

## 2013-01-28 MED ORDER — CELECOXIB 200 MG PO CAPS: 200.0000 mg | ORAL_CAPSULE | Freq: Two times a day (BID) | ORAL | Status: DC

## 2013-01-28 MED ORDER — WARFARIN SODIUM 5 MG PO TABS: 2.5000 mg | ORAL_TABLET | Freq: Every day | ORAL | Status: DC

## 2013-01-28 MED ORDER — SODIUM CHLORIDE 0.9 % IJ SOLN: 3.0000 mL | Freq: Two times a day (BID) | INTRAMUSCULAR | Status: DC

## 2013-01-28 MED ORDER — ENOXAPARIN SODIUM 40 MG/0.4ML ~~LOC~~ SOLN: 40.0000 mg | SUBCUTANEOUS | Status: DC

## 2013-01-28 MED ORDER — METHOCARBAMOL 500 MG PO TABS: 500.0000 mg | ORAL_TABLET | Freq: Four times a day (QID) | ORAL | Status: DC | PRN

## 2013-01-28 MED ORDER — WARFARIN SODIUM 5 MG PO TABS
5.0000 mg | ORAL_TABLET | Freq: Once | ORAL | Status: DC
  Filled 2013-01-28: qty 1

## 2013-01-28 MED ORDER — MEPERIDINE HCL 50 MG PO TABS
50.0000 mg | ORAL_TABLET | ORAL | Status: DC | PRN
Start: 2013-01-28 — End: 2013-06-26

## 2013-01-28 MED ORDER — DSS 100 MG PO CAPS: 100.0000 mg | ORAL_CAPSULE | Freq: Two times a day (BID) | ORAL | Status: DC

## 2013-01-28 NOTE — Discharge Instructions (Signed)
Home Health RN and PT to be provided by [facility] [phone] Joint Injection Care After Refer to this sheet in the next few days. These instructions provide you with information on caring for yourself after you have had a joint injection. Your caregiver also may give you more specific instructions. Your treatment has been planned according to current medical practices, but problems sometimes occur. Call your caregiver if you have any problems or questions after your procedure. After any type of joint injection, it is not uncommon to experience:  Soreness, swelling, or bruising around the injection site.  Mild numbness, tingling, or weakness around the injection site caused by the numbing medicine used before or with the injection. It also is possible to experience the following effects associated with the specific agent after injection:  Iodine-based contrast agents:  Allergic reaction (itching, hives, widespread redness, and swelling beyond the injection site).  Corticosteroids (These effects are rare.):  Allergic reaction.  Increased blood sugar levels (If you have diabetes and you notice that your blood sugar levels have increased, notify your caregiver).  Increased blood pressure levels.  Mood swings.  Hyaluronic acid in the use of viscosupplementation.  Temporary heat or redness.  Temporary rash and itching.  Increased fluid accumulation in the injected joint. These effects all should resolve within a day after your procedure.  HOME CARE INSTRUCTIONS  Limit yourself to light activity the day of your procedure. Avoid lifting heavy objects, bending, stooping, or twisting.  Take prescription or over-the-counter pain medication as directed by your caregiver.  You may apply ice to your injection site to reduce pain and swelling the day of your procedure. Ice may be applied 03-04 times:  Put ice in a plastic bag.  Place a towel between your skin and the  bag.  Leave the ice on for no longer than 15-20 minutes each time. SEEK IMMEDIATE MEDICAL CARE IF:   Pain and swelling get worse rather than better or extend beyond the injection site.  Numbness does not go away.  Blood or fluid continues to leak from the injection site.  You have chest pain.  You have swelling of your face or tongue.  You have trouble breathing or you become dizzy.  You develop a fever, chills, or severe tenderness at the injection site that last longer than 1 day. MAKE SURE YOU:  Understand these instructions.  Watch your condition.  Get help right away if you are not doing well or if you get worse. Document Released: 09/07/2010 Document Revised: 03/19/2011 Document Reviewed: 09/07/2010 [facility] Patient Information 2014 [place].

## 2013-01-28 NOTE — Discharge Summary (Signed)
SPORTS MEDICINE & JOINT REPLACEMENT   Tara Mulch, MD   Tara Spry, PA-C [address], [place], [place]  [address]                             [phone]  PATIENT ID: Tara Cox        MRN:  [id number]          DOB/AGE: 08-13-1934 / 79 y.o.    DISCHARGE SUMMARY  ADMISSION DATE:    01/26/2013 DISCHARGE DATE:   01/28/2013   ADMISSION DIAGNOSIS: osteoarthritis right knee    DISCHARGE DIAGNOSIS:  osteoarthritis right knee    ADDITIONAL DIAGNOSIS: Active Problems:   DJD (degenerative joint disease) of knee  Past Medical History  Diagnosis Date  . IBS (irritable bowel syndrome)   . Arthritis   . A-fib   . Hypertension 02/24/09    Echo-EF 62%; Myocardial Perfusion Study-Normal. No signifcant ischemia noted.  . Edema of lower extremity 06/29/09    Lower Venous Exam- normal. No evidence of thrombus or thrombophlebitis.  Marland Kitchen PONV (postoperative nausea and vomiting)   . Migraine   . GERD (gastroesophageal reflux disease)   . Hemorrhoids   . Frequent urination at night     PROCEDURE: Procedure(s): TOTAL KNEE ARTHROPLASTY on 01/26/2013  CONSULTS:     HISTORY: See H&P in chart  HOSPITAL COURSE:  Tara Cox is a 79 y.o. admitted on 01/26/2013 and found to have a diagnosis of osteoarthritis right knee.  After appropriate laboratory studies were obtained  they were taken to the operating room on 01/26/2013 and underwent Procedure(s): TOTAL KNEE ARTHROPLASTY.   They were given perioperative antibiotics:  Anti-infectives   Start     Dose/Rate Route Frequency Ordered Stop   01/26/13 1700  ceFAZolin (ANCEF) IVPB 2 g/50 mL premix     2 g 100 mL/hr over 30 Minutes Intravenous Every 6 hours 01/26/13 1540 01/26/13 2341   01/26/13 0600  ceFAZolin (ANCEF) IVPB 2 g/50 mL premix     2 g 100 mL/hr over 30 Minutes Intravenous On call to O.R. 01/25/13 1307 01/26/13 1027    .  Tolerated the procedure well.  Placed with a foley intraoperatively.  Given Ofirmev at induction  and for 48 hours.    POD# 1: Vital signs were stable.  Patient denied Chest pain, shortness of breath, or calf pain.  Patient was started on Lovenox 30 mg subcutaneously twice daily at 8am.  Consults to PT, OT, and care management were made.  The patient was weight bearing as tolerated.  CPM was placed on the operative leg 0-90 degrees for 6-8 hours a day.  Incentive spirometry was taught.  Dressing was changed.  Marcaine pump and hemovac were discontinued. Patient had significant nausea with oral and iv dilaudid; changed to demerol with good result;      POD #2, Continued  PT for ambulation and exercise program.  IV saline locked.  O2 discontinued.  The remainder of the hospital course was dedicated to ambulation and strengthening.   The patient was discharged on 2 Days Post-Op in  Stable condition.  Blood products given:none  DIAGNOSTIC STUDIES: Recent vital signs: Patient Vitals for the past 24 hrs:  BP Temp Temp src Pulse Resp SpO2  01/28/13 0424 157/66 mmHg 98.4 F (36.9 C) - 103 18 93 %  01/27/13 2034 163/52 mmHg 99.1 F (37.3 C) - 110 18 93 %  01/27/13 2000 - - - -  18 -  01/27/13 1600 - - - - 18 -  01/27/13 1500 145/62 mmHg 100.2 F (37.9 C) Oral 90 18 90 %       Recent laboratory studies:  Recent Labs  01/26/13 2145 01/27/13 0440 01/28/13 0505  WBC 12.0* 11.1* 12.1*  HGB 12.9 11.5* 10.7*  HCT 39.7 35.6* 33.3*  PLT 147* 182 152    Recent Labs  01/26/13 2145 01/27/13 0440 01/28/13 0505  NA  --  143 141  K  --  4.2 4.1  CL  --  104 103  CO2  --  28 27  BUN  --  15 12  CREATININE 0.72 0.71 0.70  GLUCOSE  --  106* 100*  CALCIUM  --  8.5 8.5   Lab Results  Component Value Date   INR 1.41 01/28/2013   INR 1.13 01/27/2013   INR 0.99 01/26/2013     Recent Radiographic Studies :  Dg Chest 2 View  01/19/2013   CLINICAL DATA:  Pre-admit for knee surgery  EXAM: CHEST  2 VIEW  COMPARISON:  None.  FINDINGS: The heart size and mediastinal contours are within normal  limits. Both lungs are clear. The visualized skeletal structures are unremarkable.  IMPRESSION: No active cardiopulmonary disease.   Electronically Signed   By: Kathreen Devoid   On: 01/19/2013 11:16    DISCHARGE INSTRUCTIONS: Discharge Orders   Future Orders Complete By Expires   Call MD / Call 911  As directed    Comments:     If you experience chest pain or shortness of breath, CALL 911 and be transported to the hospital emergency room.  If you develope a fever above 101 F, pus (white drainage) or increased drainage or redness at the wound, or calf pain, call your surgeon's office.   Change dressing  As directed    Comments:     Change the dressing daily with sterile 4 x 4 inch gauze dressing and apply TED hose.  You may clean the incision with alcohol prior to redressing.   Constipation Prevention  As directed    Comments:     Drink plenty of fluids.  Prune juice and/or coffee may be helpful.  You may use a stool softener, such as Colace (over the counter) 100 mg twice a day.  Use MiraLax (over the counter) for constipation as needed but this may take several days to work.  Mag Citrate --OR-- Milk of Magnesia may also be used but follow directions on the label.   CPM  As directed    Comments:     Continuous passive motion machine (CPM):      Use the CPM from 0 to 90 for 6-8 hours per day.     .  You may break it up into 2 hour sessions.      Use CPM for 3-4 weeks or until you are told to stop.   Diet - low sodium heart healthy  As directed    Discharge instructions  As directed    Comments:     Diet: As you were doing prior to hospitalization   Activity:  Increase activity slowly as tolerated                  No lifting or driving for 6 weeks  Shower:  May shower without a dressing once there is no drainage from your wound.                 Do  NOT wash over the wound.                 Dressing:  You may change your dressing on Saturday                    Then change the dressing daily  with sterile 4"x4"s gauze dressing                     And TED hose for knees.  Weight Bearing:  Weight bearing as tolerated as taught in physical therapy.  Use a                                walker or Crutches as instructed.  To prevent constipation: you may use a stool softener such as -               Colace ( over the counter) 100 mg by mouth twice a day                Drink plenty of fluids ( prune juice may be helpful) and high fiber foods                Miralax ( over the counter) for constipation as needed.    Precautions:  If you experience chest pain or shortness of breath - call 911 immediately               For transfer to the hospital emergency department!!               If you develop a fever greater that 101 F, purulent drainage from wound,                             increased redness or drainage from wound, or calf pain -- Call the office.  Follow- Up Appointment:  Please call for an appointment to be seen on feb 3rd                                              [place] office:  [phone]            [place], [place] [address]   Do not put a pillow under the knee. Place it under the heel.  As directed    Comments:     Place grey foam block under heel at all times except when up walking or in CPM.  You must sleep in it at night   Increase activity slowly as tolerated  As directed    Patient may shower  As directed    Comments:     You may shower over the brown dressing.  Once the dressing is removed you may shower without a dressing once there is no drainage.  Do not wash over the wound.  If drainage remains, cover wound with plastic wrap and then shower   TED hose  As directed    Comments:     Use stockings (TED hose) for 2 weeks on both leg(s).  You may remove them at night for sleeping.      DISCHARGE MEDICATIONS:     Medication List    STOP taking these medications  aspirin 81 MG tablet     CENTRUM ULTRA WOMENS PO      diclofenac 75 MG EC tablet  Commonly known as:  VOLTAREN      TAKE these medications       celecoxib 200 MG capsule  Commonly known as:  CELEBREX  Take 1 capsule (200 mg total) by mouth every 12 (twelve) hours.     cycloSPORINE 0.05 % ophthalmic emulsion  Commonly known as:  RESTASIS  1 drop 2 (two) times daily.     DSS 100 MG Caps  Take 100 mg by mouth 2 (two) times daily.     enoxaparin 40 MG/0.4ML injection  Commonly known as:  LOVENOX  Inject 0.4 mLs (40 mg total) into the skin daily. Until therapeutic on coumadin     ESTER C PO  Take by mouth. Patient taking 500 mg daily     furosemide 20 MG tablet  Commonly known as:  LASIX  Take 20 mg by mouth daily.     gabapentin 300 MG capsule  Commonly known as:  NEURONTIN  Take 300 mg by mouth at bedtime.     losartan-hydrochlorothiazide 100-25 MG per tablet  Commonly known as:  HYZAAR  Take 1 tablet by mouth daily.     magnesium oxide 400 MG tablet  Commonly known as:  MAG-OX  Take 800 mg by mouth every evening.     meperidine 50 MG tablet  Commonly known as:  DEMEROL  Take 1 tablet (50 mg total) by mouth every 4 (four) hours as needed for severe pain.     methocarbamol 500 MG tablet  Commonly known as:  ROBAXIN  Take 1 tablet (500 mg total) by mouth every 6 (six) hours as needed for muscle spasms.     metoprolol tartrate 25 MG tablet  Commonly known as:  LOPRESSOR  Take 12.5 mg by mouth 2 (two) times daily. Patient takes 1/2 in am and 1/2 in pm     misoprostol 200 MCG tablet  Commonly known as:  CYTOTEC  Take 200 mcg by mouth daily.     potassium chloride SA 20 MEQ tablet  Commonly known as:  K-DUR,KLOR-CON  Take 10 mEq by mouth daily.     simvastatin 10 MG tablet  Commonly known as:  ZOCOR  Take 10 mg by mouth at bedtime.     triamcinolone cream 0.1 %  Commonly known as:  KENALOG  Apply 1 application topically 2 (two) times daily.     warfarin 5 MG tablet  Commonly known as:  COUMADIN  Take 0.5-1  tablets (2.5-5 mg total) by mouth daily. Take 5 mg tablet daily or as directed by pharmacist from [facility]:       Follow-up Information   Follow up with Rudean Haskell, MD On 02/10/2013.   Specialty:  Orthopedic Surgery   Contact information:   [address] [place] [address] [phone]       DISPOSITION: HOME VS. SNF  CONDITION:  Stable   ROBERTS,JANE B 01/28/2013, 1:18 PM

## 2013-01-28 NOTE — Progress Notes (Signed)
ANTICOAGULATION CONSULT NOTE - [facility] for Coumadin Indication: atrial fibrillation and VTE prophylaxis  Allergies  Allergen Reactions  . Oxycodone Nausea And Vomiting    Full body weakness   . Percocet [Oxycodone-Acetaminophen] Itching    Pt says she can take tylenol   . Soma [Carisoprodol] Nausea And Vomiting  . Ultram [Tramadol Hcl] Nausea And Vomiting    Patient Measurements:    Vital Signs: Temp: 98.4 F (36.9 C) (01/21 0424) BP: 157/66 mmHg (01/21 0424) Pulse Rate: 103 (01/21 0424)  Labs:  Recent Labs  01/26/13 0807  01/26/13 2145 01/27/13 0440 01/28/13 0505  HGB  --   < > 12.9 11.5* 10.7*  HCT  --   --  39.7 35.6* 33.3*  PLT  --   --  147* 182 152  APTT 28  --   --   --   --   LABPROT 12.9  --   --  14.3 16.9*  INR 0.99  --   --  1.13 1.41  CREATININE  --   --  0.72 0.71 0.70  < > = values in this interval not displayed.   Medications:  Prescriptions prior to admission  Medication Sig Dispense Refill  . aspirin 81 MG tablet Take 81 mg by mouth daily.        Marland Kitchen Bioflavonoid Products (ESTER C PO) Take by mouth. Patient taking 500 mg daily       . diclofenac (VOLTAREN) 75 MG EC tablet Take 75 mg by mouth 2 (two) times daily.      . furosemide (LASIX) 20 MG tablet Take 20 mg by mouth daily.        Marland Kitchen gabapentin (NEURONTIN) 300 MG capsule Take 300 mg by mouth at bedtime.      Marland Kitchen losartan-hydrochlorothiazide (HYZAAR) 100-25 MG per tablet Take 1 tablet by mouth daily.        . magnesium oxide (MAG-OX) 400 MG tablet Take 800 mg by mouth every evening.      . metoprolol tartrate (LOPRESSOR) 25 MG tablet Take 12.5 mg by mouth 2 (two) times daily. Patient takes 1/2 in am and 1/2 in pm      . misoprostol (CYTOTEC) 200 MCG tablet Take 200 mcg by mouth daily.      . Multiple Vitamins-Minerals (CENTRUM ULTRA WOMENS PO) Take by mouth.        . potassium chloride SA (K-DUR,KLOR-CON) 20 MEQ tablet Take 10 mEq by mouth daily.      . simvastatin  (ZOCOR) 10 MG tablet Take 10 mg by mouth at bedtime.        . triamcinolone cream (KENALOG) 0.1 % Apply 1 application topically 2 (two) times daily.      Marland Kitchen warfarin (COUMADIN) 5 MG tablet Take 2.5-5 mg by mouth daily. Take 2.5mg  on Saturday. Take 5mg  the rest of the week      . cycloSPORINE (RESTASIS) 0.05 % ophthalmic emulsion 1 drop 2 (two) times daily.          Assessment: 79 year old female POD #2 s/p right knee TKA.  She is on chronic anticoagulation with Coumadin for atrial fibrillation with her last dose taken 1/13 in preparation for surgery.  INR 1.13 > 1.41.  Pt continues on Lovenox as well.  Goal of Therapy:  INR 2-3   Plan:  Repeat Coumadin 5mg  today Daily PT/INR monitoring Lovenox 30mg  SQ q12h until INR >/= 1.8  [employer], Pharm.D., BCPS Clinical Pharmacist Pager [phone] 01/28/2013  12:23 PM

## 2013-01-28 NOTE — Progress Notes (Signed)
Physical Therapy Treatment Patient Details Name: Tara Cox MRN: [id number] DOB: 06-Oct-1934 Today's Date: 01/28/2013 Time: 4562-5638 PT Time Calculation (min): 24 min  PT Assessment / Plan / Recommendation  History of Present Illness Pt is a 79 y/o female admitted s/p R TKA.   PT Comments   Pt just received breakfast but agreeable to participate in therapy.  Increased ambulation distance but required encouragement to do so.  Cont with current POC to maximize functional mobility prior to d/c home.     Follow Up Recommendations  Home health PT     Does the patient have the potential to tolerate intense rehabilitation     Barriers to Discharge        Equipment Recommendations  None recommended by PT    Recommendations for Other Services    Frequency 7X/week   Progress towards PT Goals Progress towards PT goals: Progressing toward goals  Plan Current plan remains appropriate    Precautions / Restrictions Precautions Precautions: Fall;Knee Precaution Booklet Issued: No Restrictions RLE Weight Bearing: Weight bearing as tolerated   Pertinent Vitals/Pain 4/10 Rt knee.      Mobility  Bed Mobility Overal bed mobility: Needs Assistance Bed Mobility: Supine to Sit Supine to sit: Min assist General bed mobility comments: (A) to manage RLE.   Transfers Overall transfer level: Needs assistance Equipment used: Rolling walker (2 wheeled) Transfers: Sit to/from Stand Sit to Stand: Min guard General transfer comment: cues for hand placement & technique.  Ambulation/Gait Ambulation/Gait assistance: Min guard Ambulation Distance (Feet): 70 Feet Assistive device: Rolling walker (2 wheeled) Gait Pattern/deviations: Step-to pattern;Decreased weight shift to right Gait velocity: Decreased Gait velocity interpretation: Below normal speed for age/gender General Gait Details: cues for sequencing, encouragement to increase step length, & to increase RLE WBing.        PT Goals  (current goals can now be found in the care plan section) Acute Rehab PT Goals PT Goal Formulation: With patient/family Time For Goal Achievement: 02/10/13 Potential to Achieve Goals: Good  Visit Information  Last PT Received On: 01/28/13 Assistance Needed: +1 History of Present Illness: Pt is a 79 y/o female admitted s/p R TKA.    Subjective Data  Subjective: "Do I have to go any further?"   Cognition  Cognition Arousal/Alertness: Awake/alert Behavior During Therapy: WFL for tasks assessed/performed Overall Cognitive Status: Within Functional Limits for tasks assessed    Balance     End of Session PT - End of Session Activity Tolerance: Patient tolerated treatment well Patient left: with call bell/phone within reach;with family/visitor present;in bed Nurse Communication: Mobility status;Other (comment)   GP     Sena Hitch 01/28/2013, 12:02 PM   Sarajane Marek, PTA [phone] 01/28/2013

## 2013-01-28 NOTE — Progress Notes (Signed)
PT Cancellation Note  Patient Details Name: Tara Cox MRN: [id number] DOB: 08-06-1934   Cancelled Treatment:     2nd PT session cancelled per pt's request.  Getting ready to d/c home & wants to conserve energy.  Pt denies any questions or concerns about returning home.      Sarajane Marek, [place] [phone] 01/28/2013

## 2013-01-28 NOTE — Progress Notes (Signed)
Subjective: 2 Days Post-Op Procedure(s) (LRB): TOTAL KNEE ARTHROPLASTY (Right) Patient reports pain as 4 on 0-10 scale. Denies any nausea x 24 hrs  Objective: Vital signs in last 24 hours: Temp:  [98.4 F (36.9 C)-100.2 F (37.9 C)] 98.4 F (36.9 C) (01/21 0424) Pulse Rate:  [90-110] 103 (01/21 0424) Resp:  [18] 18 (01/21 0424) BP: (145-163)/(52-66) 157/66 mmHg (01/21 0424) SpO2:  [90 %-93 %] 93 % (01/21 0424)  Intake/Output from previous day: 01/20 0701 - 01/21 0700 In: 480 [P.O.:480] Out: -  Intake/Output this shift: Total I/O In: 360 [P.O.:360] Out: -    Recent Labs  01/26/13 2145 01/27/13 0440 01/28/13 0505  HGB 12.9 11.5* 10.7*    Recent Labs  01/27/13 0440 01/28/13 0505  WBC 11.1* 12.1*  RBC 4.28 3.98  HCT 35.6* 33.3*  PLT 182 152    Recent Labs  01/27/13 0440 01/28/13 0505  NA 143 141  K 4.2 4.1  CL 104 103  CO2 28 27  BUN 15 12  CREATININE 0.71 0.70  GLUCOSE 106* 100*  CALCIUM 8.5 8.5    Recent Labs  01/27/13 0440 01/28/13 0505  INR 1.13 1.41    ABD soft Neurovascular intact Dorsiflexion/Plantar flexion intact Incision: scant drainage No cellulitis present Compartment soft  Assessment/Plan: 2 Days Post-Op Procedure(s) (LRB): TOTAL KNEE ARTHROPLASTY (Right) Discharge home with home health  Margerie Fraiser B 01/28/2013, 1:01 PM

## 2013-01-28 NOTE — Progress Notes (Signed)
Occupational Therapy Treatment Patient Details Name: Tara Cox MRN: [id number] DOB: [birth year] Today's Date: 01/28/2013 Time: 5427-0623 OT Time Calculation (min): 11 min  OT Assessment / Plan / Recommendation  History of present illness Pt is a 79 y/o female admitted s/p R TKA.   OT comments  Pt making progress with functional goals and scheduled to d/c home this afternoon  Follow Up Recommendations  No OT follow up;Supervision - Intermittent    Barriers to Discharge   none    Equipment Recommendations   wide 3 in 1   Recommendations for Other Services    Frequency Min 2X/week   Progress towards OT Goals Progress towards OT goals: Progressing toward goals  Plan Discharge plan remains appropriate    Precautions / Restrictions Precautions Precautions: Fall;Knee Precaution Booklet Issued: No Restrictions Weight Bearing Restrictions: Yes RLE Weight Bearing: Weight bearing as tolerated   Pertinent Vitals/Pain 4/10    ADL  Lower Body Dressing: Performed;Min guard;Minimal assistance Where Assessed - Lower Body Dressing: Supported sitting;Supported sit to stand Toilet Transfer: Performed;Min Radio broadcast assistant Method: Sit to Loss adjuster, chartered: Therapist, occupational and Hygiene: Performed;Supervision/safety;Min guard Where Assessed - Best boy and Hygiene: Standing Tub/Shower Transfer: Insurance claims handler Method: Therapist, art: Grab bars    OT Diagnosis:    OT Problem List:   OT Treatment Interventions:     OT Goals(current goals can now be found in the care plan section)    Visit Information  Last OT Received On: 01/28/13 Assistance Needed: +1 History of Present Illness: Pt is a 79 y/o female admitted s/p R TKA.    Subjective Data      Prior Functioning       Cognition  Cognition Arousal/Alertness: Awake/alert Behavior  During Therapy: WFL for tasks assessed/performed Overall Cognitive Status: Within Functional Limits for tasks assessed    Mobility  Bed Mobility Overal bed mobility: Needs Assistance Bed Mobility: Supine to Sit Supine to sit: Min assist General bed mobility comments: pt on BSC upon entering room Transfers Overall transfer level: Needs assistance Equipment used: Rolling walker (2 wheeled) Transfers: Sit to/from Stand Sit to Stand: Min guard General transfer comment: cues for hand placement & technique.           Balance Balance Overall balance assessment: No apparent balance deficits (not formally assessed)  End of Session OT - End of Session Equipment Utilized During Treatment: Rolling walker;Other (comment) (BSC) Activity Tolerance: Patient tolerated treatment well Patient left: with call bell/phone within reach;with family/visitor present;in chair  GO     Britt Bottom 01/28/2013, 2:40 PM

## 2013-01-29 ENCOUNTER — Encounter (HOSPITAL_COMMUNITY): Payer: Self-pay | Admitting: Orthopedic Surgery

## 2013-01-30 NOTE — Addendum Note (Signed)
Addendum created 01/30/13 1113 by Chris Alenna Russell, MD   Modules edited: Anesthesia Attestations    

## 2013-02-27 ENCOUNTER — Other Ambulatory Visit: Payer: Self-pay | Admitting: Orthopedic Surgery

## 2013-04-24 LAB — PROTIME-INR: INR: 2.8 — AB (ref 0.9–1.1)

## 2013-06-02 LAB — PROTIME-INR: INR: 2.5 — AB (ref 0.9–1.1)

## 2013-06-16 ENCOUNTER — Ambulatory Visit: Payer: Medicare Other | Admitting: Cardiovascular Disease

## 2013-06-26 ENCOUNTER — Encounter: Payer: Self-pay | Admitting: Cardiovascular Disease

## 2013-06-26 ENCOUNTER — Ambulatory Visit (INDEPENDENT_AMBULATORY_CARE_PROVIDER_SITE_OTHER): Payer: Medicare Other | Admitting: Cardiovascular Disease

## 2013-06-26 VITALS — BP 112/68 | HR 69 | Ht 67.0 in | Wt 223.5 lb

## 2013-06-26 DIAGNOSIS — I480 Paroxysmal atrial fibrillation: Secondary | ICD-10-CM | POA: Insufficient documentation

## 2013-06-26 DIAGNOSIS — I4891 Unspecified atrial fibrillation: Secondary | ICD-10-CM

## 2013-06-26 DIAGNOSIS — I10 Essential (primary) hypertension: Secondary | ICD-10-CM

## 2013-06-26 DIAGNOSIS — E785 Hyperlipidemia, unspecified: Secondary | ICD-10-CM

## 2013-06-26 NOTE — Assessment & Plan Note (Signed)
Maintaining sinus rhythm on Coumadin anticoagulation, beta blocker

## 2013-06-26 NOTE — Assessment & Plan Note (Signed)
Well-controlled on current medications 

## 2013-06-26 NOTE — Progress Notes (Signed)
06/26/2013 Tara Cox   23-Mar-1934  [id number]  Primary Physician Stephens Shire, MD Primary Cardiologist: Lorretta Harp MD [name]   HPI:  The patient is a delightful, 79 year old, mildly overweight, married [place] American female, mother of 3 children whose husband Tara Cox is also a patient of mine. I saw her last February 02, 2010. She has a history remarkable for paroxysmal atrial fib maintaining sinus rhythm on Coumadin anticoagulation, hypertension, hyperlipidemia, and lower extremity edema in the past. She has had a normal 2D echo and Myoview. An event monitor has shown PAF for which she was placed on Coumadin and low dose beta blocker. She was also put on low dose diuretic for lower extremity edema which resolved. She is otherwise asymptomatic. Since I saw her 1 year ago she denies chest pain or shortness of breath. Her Coumadin is followed by her primary care physician.    Current Outpatient Prescriptions  Medication Sig Dispense Refill  . Bioflavonoid Products (ESTER C PO) Take by mouth. Patient taking 500 mg daily       . cycloSPORINE (RESTASIS) 0.05 % ophthalmic emulsion 1 drop 2 (two) times daily.        . diclofenac (VOLTAREN) 75 MG EC tablet Take 75 mg by mouth 2 (two) times daily.      . furosemide (LASIX) 20 MG tablet Take 20 mg by mouth daily.        Marland Kitchen gabapentin (NEURONTIN) 300 MG capsule Take 300 mg by mouth at bedtime.      Marland Kitchen losartan-hydrochlorothiazide (HYZAAR) 100-25 MG per tablet Take 1 tablet by mouth daily.        . magnesium oxide (MAG-OX) 400 MG tablet Take 800 mg by mouth every evening.      . metoprolol tartrate (LOPRESSOR) 25 MG tablet Take 12.5 mg by mouth 2 (two) times daily. Patient takes 1/2 in am and 1/2 in pm      . misoprostol (CYTOTEC) 200 MCG tablet Take 200 mcg by mouth daily.      . potassium chloride SA (K-DUR,KLOR-CON) 20 MEQ tablet Take 10 mEq by mouth daily.      . simvastatin (ZOCOR) 10 MG tablet Take 10 mg by mouth at  bedtime.        Marland Kitchen warfarin (COUMADIN) 5 MG tablet Take 0.5-1 tablets (2.5-5 mg total) by mouth daily. Take 5 mg tablet daily or as directed by pharmacist from [facility]  40 tablet  1   No current facility-administered medications for this visit.    Allergies  Allergen Reactions  . Oxycodone Nausea And Vomiting    Full body weakness   . Percocet [Oxycodone-Acetaminophen] Itching    Pt says she can take tylenol   . Soma [Carisoprodol] Nausea And Vomiting  . Ultram [Tramadol Hcl] Nausea And Vomiting    History   Social History  . Marital Status: Married    Spouse Name: N/A    Number of Children: N/A  . Years of Education: N/A   Occupational History  . Not on file.   Social History Main Topics  . Smoking status: Former Research scientist (life sciences)  . Smokeless tobacco: Never Used     Comment: ' i QUIT SMOKING MANY YEARS AGO "  . Alcohol Use: No  . Drug Use: No  . Sexual Activity: Not on file   Other Topics Concern  . Not on file   Social History Narrative  . No narrative on file     Review of  Systems: General: negative for chills, fever, night sweats or weight changes.  Cardiovascular: negative for chest pain, dyspnea on exertion, edema, orthopnea, palpitations, paroxysmal nocturnal dyspnea or shortness of breath Dermatological: negative for rash Respiratory: negative for cough or wheezing Urologic: negative for hematuria Abdominal: negative for nausea, vomiting, diarrhea, bright red blood per rectum, melena, or hematemesis Neurologic: negative for visual changes, syncope, or dizziness All other systems reviewed and are otherwise negative except as noted above.    Blood pressure 112/68, pulse 69, height 5\' 7"  (1.702 m), weight 223 lb 8 oz (101.379 kg).  General appearance: alert and no distress Neck: no adenopathy, no carotid bruit, no JVD, supple, symmetrical, trachea midline and thyroid not enlarged, symmetric, no tenderness/mass/nodules Lungs: clear to auscultation  bilaterally Heart: regular rate and rhythm, S1, S2 normal, no murmur, click, rub or gallop Extremities: extremities normal, atraumatic, no cyanosis or edema  EKG normal sinus rhythm at 69 without ST or T wave changes  ASSESSMENT AND PLAN:   Hyperlipidemia On statin therapy followed by her PCP  Essential hypertension Well-controlled on current medications  Paroxysmal atrial fibrillation Maintaining sinus rhythm on Coumadin anticoagulation, beta blocker       Lorretta Harp MD [facility], [facility] 06/26/2013 8:36 AM

## 2013-06-26 NOTE — Assessment & Plan Note (Signed)
On statin therapy followed by her PCP 

## 2013-06-26 NOTE — Patient Instructions (Signed)
Your physician recommends that you schedule a follow-up appointment in: ONE YEAR with Dr. Berry.  

## 2014-04-30 ENCOUNTER — Telehealth: Payer: Self-pay | Admitting: *Deleted

## 2014-04-30 NOTE — Telephone Encounter (Signed)
Dr Ulyses Amor office is requesting clearance for patient to have a colonoscopy and instructions about coumadin. I will defer to Dr Gwenlyn Found.

## 2014-04-30 NOTE — Telephone Encounter (Signed)
The patient can stop Coumadin for her upcoming GI procedure and restarted afterwards

## 2014-05-03 NOTE — Telephone Encounter (Signed)
I will route this information to Donnelly Stager at [facility] office.

## 2014-05-05 ENCOUNTER — Telehealth: Payer: Self-pay | Admitting: Cardiovascular Disease

## 2014-05-05 NOTE — Telephone Encounter (Signed)
Forward to [place]

## 2014-05-05 NOTE — Telephone Encounter (Signed)
Tara Cox called in wanting to know if Dr. Gwenlyn Found approves of her holding her Coumadin for 5 days prior to her Colonoscopy which will be done on 5/5. Please call back  Thanks

## 2014-05-06 NOTE — Telephone Encounter (Signed)
LMOM for Tara Cox and spoke with patient as well.  Ok to hold warfarin x 5 days for colonoscopy.

## 2014-05-17 ENCOUNTER — Other Ambulatory Visit: Payer: Self-pay | Admitting: Gastroenterology

## 2014-05-20 ENCOUNTER — Encounter (HOSPITAL_COMMUNITY): Payer: Self-pay | Admitting: *Deleted

## 2014-05-27 NOTE — H&P (Signed)
  Tara Cox HPI: The patient reports a long history of abdominal pain. She was originally evaluated in [place] and she had an EGD/Colonoscopy. At that time she was taking a significant amount of NSAIDs to help control her sciatica. The EGD did reveal a healed ulcer. She subsequently saw another GI physician she was then treated with a Prevpak and it was beneficial. Two years ago she was scheduled to have a colonoscopy, but she cancelled as a result of a scheduling conflict. No reports of any hematochezia or melena. All the prior work up was over 10 years ago. Lying down and moving will exacerbate her pain that is located in the epigastric region.  Past Medical History  Diagnosis Date  . IBS (irritable bowel syndrome)   . Arthritis   . A-fib   . Hypertension 02/24/09    Echo-EF 62%; Myocardial Perfusion Study-Normal. No signifcant ischemia noted.  . Edema of lower extremity 06/29/09    Lower Venous Exam- normal. No evidence of thrombus or thrombophlebitis.  Marland Kitchen PONV (postoperative nausea and vomiting)   . Migraine   . GERD (gastroesophageal reflux disease)   . Hemorrhoids   . Frequent urination at night     Past Surgical History  Procedure Laterality Date  . Cesarean section  1971  . Abdominal hysterectomy  1974  . Back surgery  2001  . Uterine fibroid surgery  1967  . Bunionectomy  1986  . Appendectomy    . Eye surgery Bilateral     cataract removal  . Colonoscopy    . Total knee arthroplasty Right 01/26/2013    DR LUCEY  . Total knee arthroplasty Right 01/26/2013    Procedure: TOTAL KNEE ARTHROPLASTY;  Surgeon: Vickey Huger, MD;  Location: [place];  Service: Orthopedics;  Laterality: Right;  . Exploratory laparotomy      open procedure    Family History  Problem Relation Age of Onset  . Heart disease Father     heart attack  . Diabetes Sister   . Other Sister     pacemaker  . Diabetes Sister   . Heart disease Sister     Social History:  reports that she quit smoking  about 24 years ago. She has never used smokeless tobacco. She reports that she does not drink alcohol or use illicit drugs.  Allergies:  Allergies  Allergen Reactions  . Oxycodone Nausea And Vomiting    Full body weakness   . Percocet [Oxycodone-Acetaminophen] Itching    Pt says she can take tylenol   . Soma [Carisoprodol] Nausea And Vomiting  . Ultram [Tramadol Hcl] Nausea And Vomiting    Medications: Scheduled: Continuous:  No results found for this or any previous visit (from the past 24 hour(s)).   No results found.  ROS:  As stated above in the HPI otherwise negative.  There were no vitals taken for this visit.    PE: Gen: NAD, Alert and Oriented HEENT:  [place]/AT, EOMI Neck: Supple, no LAD Lungs: CTA Bilaterally CV: RRR without M/G/R ABM: Soft, NTND, +BS Ext: No C/C/E  Assessment/Plan: 1) Epigastric pain. 2) Screening colonoscopy.  Plan: 1) EGD/Colonoscopy.  Abdulhadi Stopa D 05/27/2014, 2:06 PM

## 2014-05-28 ENCOUNTER — Ambulatory Visit (HOSPITAL_COMMUNITY): Payer: Medicare Other | Admitting: Anesthesiology

## 2014-05-28 ENCOUNTER — Ambulatory Visit (HOSPITAL_COMMUNITY)
Admission: RE | Admit: 2014-05-28 | Discharge: 2014-05-28 | Disposition: A | Discharge status: Home / Self Care | Payer: Medicare Other | Source: Ambulatory Visit | Attending: Gastroenterology | Admitting: Gastroenterology

## 2014-05-28 ENCOUNTER — Encounter (HOSPITAL_COMMUNITY)
Admission: RE | Disposition: A | Discharge status: Home / Self Care | Payer: Self-pay | Source: Ambulatory Visit | Attending: Gastroenterology

## 2014-05-28 ENCOUNTER — Encounter (HOSPITAL_COMMUNITY): Payer: Self-pay | Admitting: *Deleted

## 2014-05-28 DIAGNOSIS — M1990 Unspecified osteoarthritis, unspecified site: Secondary | ICD-10-CM | POA: Diagnosis not present

## 2014-05-28 DIAGNOSIS — Z7901 Long term (current) use of anticoagulants: Secondary | ICD-10-CM | POA: Diagnosis not present

## 2014-05-28 DIAGNOSIS — I739 Peripheral vascular disease, unspecified: Secondary | ICD-10-CM | POA: Insufficient documentation

## 2014-05-28 DIAGNOSIS — Z1211 Encounter for screening for malignant neoplasm of colon: Secondary | ICD-10-CM | POA: Diagnosis present

## 2014-05-28 DIAGNOSIS — K449 Diaphragmatic hernia without obstruction or gangrene: Secondary | ICD-10-CM | POA: Insufficient documentation

## 2014-05-28 DIAGNOSIS — Z79899 Other long term (current) drug therapy: Secondary | ICD-10-CM | POA: Insufficient documentation

## 2014-05-28 DIAGNOSIS — Z87891 Personal history of nicotine dependence: Secondary | ICD-10-CM | POA: Diagnosis not present

## 2014-05-28 DIAGNOSIS — K219 Gastro-esophageal reflux disease without esophagitis: Secondary | ICD-10-CM | POA: Diagnosis not present

## 2014-05-28 DIAGNOSIS — I4891 Unspecified atrial fibrillation: Secondary | ICD-10-CM | POA: Diagnosis not present

## 2014-05-28 DIAGNOSIS — K589 Irritable bowel syndrome without diarrhea: Secondary | ICD-10-CM | POA: Diagnosis not present

## 2014-05-28 DIAGNOSIS — R1013 Epigastric pain: Secondary | ICD-10-CM | POA: Diagnosis present

## 2014-05-28 DIAGNOSIS — G43909 Migraine, unspecified, not intractable, without status migrainosus: Secondary | ICD-10-CM | POA: Insufficient documentation

## 2014-05-28 DIAGNOSIS — K5730 Diverticulosis of large intestine without perforation or abscess without bleeding: Secondary | ICD-10-CM | POA: Insufficient documentation

## 2014-05-28 DIAGNOSIS — K208 Other esophagitis: Secondary | ICD-10-CM | POA: Diagnosis not present

## 2014-05-28 DIAGNOSIS — I10 Essential (primary) hypertension: Secondary | ICD-10-CM | POA: Insufficient documentation

## 2014-05-28 DIAGNOSIS — Z96651 Presence of right artificial knee joint: Secondary | ICD-10-CM | POA: Diagnosis not present

## 2014-05-28 HISTORY — PX: COLONOSCOPY WITH PROPOFOL: SHX5780

## 2014-05-28 HISTORY — PX: ESOPHAGOGASTRODUODENOSCOPY (EGD) WITH PROPOFOL: SHX5813

## 2014-05-28 SURGERY — ESOPHAGOGASTRODUODENOSCOPY (EGD) WITH PROPOFOL
Anesthesia: Monitor Anesthesia Care

## 2014-05-28 MED ORDER — SODIUM CHLORIDE 0.9 % IV SOLN: INTRAVENOUS | Status: DC

## 2014-05-28 MED ORDER — LIDOCAINE HCL (CARDIAC) 20 MG/ML IV SOLN
INTRAVENOUS | Status: DC | PRN
  Administered 2014-05-28 (×2): 50 mg via INTRAVENOUS

## 2014-05-28 MED ORDER — LIDOCAINE HCL (CARDIAC) 20 MG/ML IV SOLN
INTRAVENOUS | Status: AC
  Filled 2014-05-28: qty 5

## 2014-05-28 MED ORDER — PROPOFOL 10 MG/ML IV BOLUS
INTRAVENOUS | Status: AC
  Filled 2014-05-28: qty 20

## 2014-05-28 MED ORDER — GLYCOPYRROLATE 0.2 MG/ML IJ SOLN
INTRAMUSCULAR | Status: AC
  Filled 2014-05-28: qty 1

## 2014-05-28 MED ORDER — PROPOFOL 10 MG/ML IV BOLUS
INTRAVENOUS | Status: AC
Start: 2014-05-28 — End: 2014-05-28
  Filled 2014-05-28: qty 20

## 2014-05-28 MED ORDER — ESMOLOL HCL 10 MG/ML IV SOLN
INTRAVENOUS | Status: AC
  Filled 2014-05-28: qty 10

## 2014-05-28 MED ORDER — PROPOFOL 10 MG/ML IV BOLUS
INTRAVENOUS | Status: DC | PRN
  Administered 2014-05-28: 30 mg via INTRAVENOUS
  Administered 2014-05-28: 50 mg via INTRAVENOUS

## 2014-05-28 MED ORDER — LACTATED RINGERS IV SOLN
INTRAVENOUS | Status: DC
  Administered 2014-05-28: 1000 mL via INTRAVENOUS

## 2014-05-28 MED ORDER — ESMOLOL HCL 10 MG/ML IV SOLN
INTRAVENOUS | Status: DC | PRN
  Administered 2014-05-28: 20 mg via INTRAVENOUS

## 2014-05-28 MED ORDER — PROPOFOL INFUSION 10 MG/ML OPTIME
INTRAVENOUS | Status: DC | PRN
  Administered 2014-05-28: 140 ug/kg/min via INTRAVENOUS

## 2014-05-28 SURGICAL SUPPLY — 25 items

## 2014-05-28 NOTE — Transfer of Care (Signed)
Immediate Anesthesia Transfer of Care Note  Patient: Tara Cox  Procedure(s) Performed: Procedure(s): ESOPHAGOGASTRODUODENOSCOPY (EGD) WITH PROPOFOL (N/A) COLONOSCOPY WITH PROPOFOL (N/A)  Patient Location: PACU  Anesthesia Type:MAC  Level of Consciousness: Patient easily awoken, sedated, comfortable, cooperative, following commands, responds to stimulation.   Airway & Oxygen Therapy: Patient spontaneously breathing, ventilating well, oxygen via simple oxygen mask.  Post-op Assessment: Report given to PACU RN, vital signs reviewed and stable, moving all extremities.   Post vital signs: Reviewed and stable.  Complications: No apparent anesthesia complications

## 2014-05-28 NOTE — Anesthesia Preprocedure Evaluation (Addendum)
Anesthesia Evaluation  Patient identified by MRN, date of birth, ID band Patient awake    Reviewed: Allergy & Precautions, H&P , NPO status , Patient's Chart, lab work & pertinent test results, reviewed documented beta blocker date and time   History of Anesthesia Complications (+) PONV and history of anesthetic complications  Airway Mallampati: II  TM Distance: >3 FB Neck ROM: Full    Dental  (+) Partial Upper, Dental Advisory Given   Pulmonary neg shortness of breath, neg COPDformer smoker,  breath sounds clear to auscultation  Pulmonary exam normal       Cardiovascular Exercise Tolerance: Good METS: 5 - 7 Mets hypertension, Pt. on medications and Pt. on home beta blockers + Peripheral Vascular Disease Normal cardiovascular exam+ dysrhythmias Atrial Fibrillation - Valvular Problems/MurmursRhythm:Irregular Rate:Normal  Negative stress test   Neuro/Psych  Headaches, Left sciatica s/p diskectomy and multiple epidural injections negative psych ROS   GI/Hepatic Neg liver ROS, GERD-  Medicated and Controlled,  Endo/Other  negative endocrine ROS  Renal/GU negative Renal ROS     Musculoskeletal  (+) Arthritis -, Osteoarthritis,    Abdominal   Peds  Hematology Chronic anticoagulation (Warfarin) for afib   Anesthesia Other Findings   Reproductive/Obstetrics                            Anesthesia Physical Anesthesia Plan  ASA: III  Anesthesia Plan: MAC   Post-op Pain Management:    Induction:   Airway Management Planned:   Additional Equipment:   Intra-op Plan:   Post-operative Plan:   Informed Consent:   Plan Discussed with: Surgeon  Anesthesia Plan Comments:         Anesthesia Quick Evaluation

## 2014-05-28 NOTE — Op Note (Signed)
[facility] [place], [address]   ENDOSCOPY PROCEDURE REPORT  PATIENT: Tara, Cox  MR#: [id number] BIRTHDATE: [birth year] , 79  yrs. old GENDER: female ENDOSCOPIST:Skyah Hannon Benson [place], MD REFERRED BY: PROCEDURE DATE:  Jun 04, 2014 PROCEDURE:   EGD, diagnostic ASA CLASS:    Class III INDICATIONS: epigastric pain. MEDICATION: Monitored anesthesia care TOPICAL ANESTHETIC:   none  DESCRIPTION OF PROCEDURE:   After the risks and benefits of the procedure were explained, informed consent was obtained.  The [place] [id number]  endoscope was introduced through the mouth  and advanced to the second portion of the duodenum .  The instrument was slowly withdrawn as the mucosa was fully examined. Estimated blood loss is zero unless otherwise noted in this procedure report.    FINDINGS: In the distal esophagus there was evidence of an LA Grade A esohpagitis in the setting of a 4 cm hiatal hernia.  No evidence of inflammation, ulcerations, erosions, polyps, masses, or vascular abnormalities in the gastric and duodenal lumens.          The scope was then withdrawn from the patient and the procedure completed.  COMPLICATIONS: There were no immediate complications.  ENDOSCOPIC IMPRESSION: 1) LA Grade A esophagitis. 2) 4 cm hiatal hernia. RECOMMENDATIONS: 1) PPI QD 30 minutes before breakfast. 2) Follow up in the office in one month.   _______________________________ eSigned:  Carol Ada, MD Jun 04, 2014 11:57 AM     cc:  CPT CODES: ICD CODES:  The ICD and CPT codes recommended by this software are interpretations from the data that the clinical staff has captured with the software.  The verification of the translation of this report to the ICD and CPT codes and modifiers is the sole responsibility of the health care institution and practicing physician where this report was generated.  [place]. will not be held  responsible for the validity of the ICD and CPT codes included on this report.  AMA assumes no liability for data contained or not contained herein. CPT is a Designer, television/film set of the [employer].

## 2014-05-28 NOTE — Anesthesia Postprocedure Evaluation (Signed)
  Anesthesia Post-op Note  Patient: Tara Cox  Procedure(s) Performed: Procedure(s) (LRB): ESOPHAGOGASTRODUODENOSCOPY (EGD) WITH PROPOFOL (N/A) COLONOSCOPY WITH PROPOFOL (N/A)  Patient Location: PACU  Anesthesia Type: MAC  Level of Consciousness: awake and alert   Airway and Oxygen Therapy: Patient Spontanous Breathing  Post-op Pain: mild  Post-op Assessment: Post-op Vital signs reviewed, Patient's Cardiovascular Status Stable, Respiratory Function Stable, Patent Airway and No signs of Nausea or vomiting  Last Vitals:  Filed Vitals:   05/28/14 1220  BP: 147/76  Pulse: 91  Temp:   Resp: 17    Post-op Vital Signs: stable   Complications: No apparent anesthesia complications

## 2014-05-28 NOTE — Discharge Instructions (Signed)
Esophagogastroduodenoscopy °Care After °Refer to this sheet in the next few weeks. These instructions provide you with information on caring for yourself after your procedure. Your caregiver may also give you more specific instructions. Your treatment has been planned according to current medical practices, but problems sometimes occur. Call your caregiver if you have any problems or questions after your procedure.  °HOME CARE INSTRUCTIONS °· Do not eat or drink anything until the numbing medicine (local anesthetic) has worn off and your gag reflex has returned. You will know that the local anesthetic has worn off when you can swallow comfortably. °· Do not drive for 12 hours after the procedure or as directed by your caregiver. °· Only take medicines as directed by your caregiver. °SEEK MEDICAL CARE IF:  °· You cannot stop coughing. °· You are not urinating at all or less than usual. °SEEK IMMEDIATE MEDICAL CARE IF: °· You have difficulty swallowing. °· You cannot eat or drink. °· You have worsening throat or chest pain. °· You have dizziness, lightheadedness, or you faint. °· You have nausea or vomiting. °· You have chills. °· You have a fever. °· You have severe abdominal pain. °· You have black, tarry, or bloody stools. °Document Released: 12/12/2011 Document Reviewed: 12/12/2011 °ExitCare® Patient Information ©2015 [employer]. This information is not intended to replace advice given to you by your health care provider. Make sure you discuss any questions you have with your health care provider. ° °

## 2014-05-28 NOTE — Op Note (Signed)
[facility] [place], [address]   COLONOSCOPY PROCEDURE REPORT  PATIENT: Tara Cox, Tara Cox  MR#: [id number] BIRTHDATE: Feb 09, 1934 , 79  yrs. old GENDER: female ENDOSCOPIST: Carol Ada, MD REFERRED BY: PROCEDURE DATE:  06-25-2014 PROCEDURE:   Colonoscopy, diagnostic ASA CLASS:   Class III INDICATIONS:Screening MEDICATIONS: Monitored anesthesia care  DESCRIPTION OF PROCEDURE:   After the risks and benefits and of the procedure were explained, informed consent was obtained.  revealed no abnormalities of the rectum.    The Pentax Ped Colon [id number] endoscope was introduced through the anus and advanced to the cecum, which was identified by both the appendix and ileocecal valve .  The quality of the prep was excellent. .  The instrument was then slowly withdrawn as the colon was fully examined. Estimated blood loss is zero unless otherwise noted in this procedure report.   FINDINGS: Scattered diverticula were found throughout the colon, but the majority were on the left side.  No evidence of any masses, inflammation, ulcerations, erosions, or vascular abnormalities. Retroflexed views revealed no abnormalities.     The scope was then withdrawn from the patient and the procedure completed.  WITHDRAWAL TIME: 10 minutes  COMPLICATIONS: There were no immediate complications. ENDOSCOPIC IMPRESSION: 1) Scattered diverticula. RECOMMENDATIONS: 1) No further screening colonoscopies in light of the current findings, patient's age, and comorbidities.  REPEAT EXAM:  cc:    _______________________________ eSignedCarol Ada, MD 06-25-2014 11:53 AM  CPT CODES: ICD CODES:  The ICD and CPT codes recommended by this software are interpretations from the data that the clinical staff has captured with the software.  The verification of the translation of this report to the ICD and CPT codes and modifiers is the sole responsibility of the health care  institution and practicing physician where this report was generated.  [place]. will not be held responsible for the validity of the ICD and CPT codes included on this report.  AMA assumes no liability for data contained or not contained herein. CPT is a Designer, television/film set of the [employer].

## 2014-05-31 ENCOUNTER — Encounter (HOSPITAL_COMMUNITY): Payer: Self-pay | Admitting: Gastroenterology

## 2014-07-05 ENCOUNTER — Other Ambulatory Visit: Payer: Self-pay

## 2014-07-13 ENCOUNTER — Encounter: Payer: Self-pay | Admitting: Cardiovascular Disease

## 2014-07-13 ENCOUNTER — Ambulatory Visit (INDEPENDENT_AMBULATORY_CARE_PROVIDER_SITE_OTHER): Payer: Medicare Other | Admitting: Cardiovascular Disease

## 2014-07-13 VITALS — BP 160/76 | HR 73 | Ht 67.0 in | Wt 230.4 lb

## 2014-07-13 DIAGNOSIS — E785 Hyperlipidemia, unspecified: Secondary | ICD-10-CM

## 2014-07-13 DIAGNOSIS — I480 Paroxysmal atrial fibrillation: Secondary | ICD-10-CM | POA: Diagnosis not present

## 2014-07-13 DIAGNOSIS — I10 Essential (primary) hypertension: Secondary | ICD-10-CM | POA: Diagnosis not present

## 2014-07-13 NOTE — Assessment & Plan Note (Signed)
History of paroxysmal atrial fibrillation maintaining sinus rhythm on low-dose beta blocker and Coumadin anticoagulation

## 2014-07-13 NOTE — Assessment & Plan Note (Signed)
History of hypertension blood pressure measured at 160/76. She is on losartan, Lopressor and hydrochlorothiazide. Continue current meds at current dosing

## 2014-07-13 NOTE — Patient Instructions (Addendum)
Dr Berry recommends that you schedule a follow-up appointment in 1 year. You will receive a reminder letter in the mail two months in advance. If you don't receive a letter, please call our office to schedule the follow-up appointment. 

## 2014-07-13 NOTE — Assessment & Plan Note (Signed)
History of hyperlipidemia on atorvastatin 20 mg a day followed by her PCP 

## 2014-07-13 NOTE — Progress Notes (Signed)
07/13/2014 Tara Cox   1934-07-06  [id number]  Primary Physician Stephens Shire, MD Primary Cardiologist: Lorretta Harp MD [name]'  HPI:   The patient is a delightful, 80 year old, mildly overweight, married [place] American female, mother of 3 children whose husband Tara Cox is also a patient of mine. I saw her last 06/26/13.Marland Kitchen She has a history remarkable for paroxysmal atrial fib maintaining sinus rhythm on Coumadin anticoagulation, hypertension, hyperlipidemia, and lower extremity edema in the past. She has had a normal 2D echo and Myoview. An event monitor has shown PAF for which she was placed on Coumadin and low dose beta blocker. She was also put on low dose diuretic for lower extremity edema which resolved. She is otherwise asymptomatic. Since I saw her 1 year ago she denies chest pain or shortness of breath. Her Coumadin is followed by her primary care physician.   Current Outpatient Prescriptions  Medication Sig Dispense Refill  . aspirin EC 81 MG tablet Take 81 mg by mouth daily.    Marland Kitchen atorvastatin (LIPITOR) 20 MG tablet Take 20 mg by mouth daily at 6 PM.    . Calcium Carb-Cholecalciferol 600-800 MG-UNIT TABS Take 1 tablet by mouth daily.    . cholecalciferol (VITAMIN D) 1000 UNITS tablet Take 1,000 Units by mouth daily.    Marland Kitchen dextromethorphan (DELSYM) 30 MG/5ML liquid Take 30 mg by mouth daily as needed for cough.    . diclofenac (VOLTAREN) 75 MG EC tablet Take 75 mg by mouth every morning.     . diphenhydrAMINE (BENADRYL) 25 mg capsule Take 25 mg by mouth every 6 (six) hours as needed for allergies.    . furosemide (LASIX) 20 MG tablet Take 20 mg by mouth daily.      Marland Kitchen gabapentin (NEURONTIN) 300 MG capsule Take 300 mg by mouth at bedtime.    Marland Kitchen losartan-hydrochlorothiazide (HYZAAR) 100-25 MG per tablet Take 1 tablet by mouth every morning.     . magnesium oxide (MAG-OX) 400 MG tablet Take 400 mg by mouth daily.     . metoprolol tartrate (LOPRESSOR) 25 MG  tablet Take 12.5 mg by mouth 2 (two) times daily. Patient takes 1/2 in am and 1/2 in pm    . misoprostol (CYTOTEC) 200 MCG tablet Take 200 mcg by mouth every morning.     . Multiple Vitamin (MULTIVITAMIN WITH MINERALS) TABS tablet Take 1 tablet by mouth daily.    Marland Kitchen OVER THE COUNTER MEDICATION Take 1 tablet by mouth daily as needed (Pain). Mygrafew (health food store)    . PE-Shark Liver Oil-Cocoa Buttr (HEMORRHOIDAL RELIEF RE) Place 1 application rectally daily as needed (Hemorrhoids).     . potassium chloride SA (K-DUR,KLOR-CON) 20 MEQ tablet Take 10 mEq by mouth daily.    Marland Kitchen tiZANidine (ZANAFLEX) 2 MG tablet Take 2 mg by mouth 3 (three) times daily as needed for muscle spasms.    Marland Kitchen triamcinolone cream (KENALOG) 0.1 % Apply 1 application topically 2 (two) times daily as needed (Vaginal Itching).    . vitamin C (ASCORBIC ACID) 500 MG tablet Take 500 mg by mouth daily.    Marland Kitchen warfarin (COUMADIN) 5 MG tablet Take 0.5-1 tablets (2.5-5 mg total) by mouth daily. Take 5 mg tablet daily or as directed by pharmacist from [facility] (Patient taking differently: Take 2.5-5 mg by mouth daily. Takes 5mg  every day except for Thursday takes 2.5mg ) 40 tablet 1  . omeprazole (PRILOSEC) 40 MG capsule Take 1 capsule by mouth daily  as needed.  12   No current facility-administered medications for this visit.    Allergies  Allergen Reactions  . Oxycodone Nausea And Vomiting    Full body weakness   . Percocet [Oxycodone-Acetaminophen] Itching    Pt says she can take tylenol   . Soma [Carisoprodol] Nausea And Vomiting  . Ultram [Tramadol Hcl] Nausea And Vomiting    History   Social History  . Marital Status: Married    Spouse Name: N/A  . Number of Children: N/A  . Years of Education: N/A   Occupational History  . Not on file.   Social History Main Topics  . Smoking status: Former Smoker    Quit date: 02/19/1990  . Smokeless tobacco: Never Used     Comment: ' i QUIT SMOKING MANY YEARS AGO "    . Alcohol Use: No  . Drug Use: No  . Sexual Activity: Not on file   Other Topics Concern  . Not on file   Social History Narrative     Review of Systems: General: negative for chills, fever, night sweats or weight changes.  Cardiovascular: negative for chest pain, dyspnea on exertion, edema, orthopnea, palpitations, paroxysmal nocturnal dyspnea or shortness of breath Dermatological: negative for rash Respiratory: negative for cough or wheezing Urologic: negative for hematuria Abdominal: negative for nausea, vomiting, diarrhea, bright red blood per rectum, melena, or hematemesis Neurologic: negative for visual changes, syncope, or dizziness All other systems reviewed and are otherwise negative except as noted above.    Blood pressure 160/76, pulse 73, height 5\' 7"  (1.702 m), weight 230 lb 6.4 oz (104.509 kg).  General appearance: alert and no distress Neck: no adenopathy, no carotid bruit, no JVD, supple, symmetrical, trachea midline and thyroid not enlarged, symmetric, no tenderness/mass/nodules Lungs: clear to auscultation bilaterally Heart: regular rate and rhythm, S1, S2 normal, no murmur, click, rub or gallop Extremities: extremities normal, atraumatic, no cyanosis or edema  EKG normal sinus rhythm at 73 with a ST or T-wave changes. I personally reviewed this EKG  ASSESSMENT AND PLAN:   Paroxysmal atrial fibrillation History of paroxysmal atrial fibrillation maintaining sinus rhythm on low-dose beta blocker and Coumadin anticoagulation  Hyperlipidemia History of hyperlipidemia on atorvastatin 20 mg a day followed by her PCP  Essential hypertension History of hypertension blood pressure measured at 160/76. She is on losartan, Lopressor and hydrochlorothiazide. Continue current meds at current dosing      Lorretta Harp MD [facility], [facility] 07/13/2014 10:11 AM

## 2014-08-11 ENCOUNTER — Other Ambulatory Visit: Payer: Self-pay | Admitting: Gastroenterology

## 2014-08-11 DIAGNOSIS — R1013 Epigastric pain: Secondary | ICD-10-CM

## 2014-08-18 ENCOUNTER — Inpatient Hospital Stay: Admission: RE | Admit: 2014-08-18 | Payer: Medicare Other | Source: Ambulatory Visit

## 2014-08-18 ENCOUNTER — Ambulatory Visit
Admission: RE | Admit: 2014-08-18 | Discharge: 2014-08-18 | Disposition: A | Discharge status: Home / Self Care | Payer: Medicare Other | Source: Ambulatory Visit | Attending: Gastroenterology | Admitting: Gastroenterology

## 2014-08-18 ENCOUNTER — Other Ambulatory Visit: Payer: Self-pay | Admitting: Gastroenterology

## 2014-08-18 DIAGNOSIS — R1013 Epigastric pain: Secondary | ICD-10-CM

## 2014-08-18 MED ORDER — IOPAMIDOL (ISOVUE-300) INJECTION 61%
125.0000 mL | Freq: Once | INTRAVENOUS | Status: AC | PRN
  Administered 2014-08-18: 125 mL via INTRAVENOUS

## 2014-12-08 ENCOUNTER — Other Ambulatory Visit: Payer: Self-pay | Admitting: Family Medicine

## 2014-12-08 DIAGNOSIS — E2839 Other primary ovarian failure: Secondary | ICD-10-CM

## 2015-01-12 ENCOUNTER — Inpatient Hospital Stay
Admission: RE | Admit: 2015-01-12 | Discharge: 2015-01-12 | Disposition: A | Discharge status: Home / Self Care | Payer: Medicare Other | Source: Ambulatory Visit | Attending: Family Medicine | Admitting: Family Medicine

## 2015-07-26 ENCOUNTER — Ambulatory Visit: Payer: Medicare Other | Admitting: Cardiovascular Disease

## 2015-08-30 ENCOUNTER — Encounter: Payer: Self-pay | Admitting: Cardiovascular Disease

## 2015-08-30 ENCOUNTER — Ambulatory Visit (INDEPENDENT_AMBULATORY_CARE_PROVIDER_SITE_OTHER): Payer: Medicare Other | Admitting: Cardiovascular Disease

## 2015-08-30 VITALS — BP 133/74 | HR 73 | Ht 67.0 in | Wt 238.0 lb

## 2015-08-30 DIAGNOSIS — E785 Hyperlipidemia, unspecified: Secondary | ICD-10-CM | POA: Diagnosis not present

## 2015-08-30 DIAGNOSIS — I10 Essential (primary) hypertension: Secondary | ICD-10-CM | POA: Diagnosis not present

## 2015-08-30 DIAGNOSIS — I480 Paroxysmal atrial fibrillation: Secondary | ICD-10-CM

## 2015-08-30 NOTE — Assessment & Plan Note (Signed)
History of hypertension with blood pressure measured 133/74. She is on losartan, hydrochlorothiazide and metoprolol. Continue current meds at current dosing

## 2015-08-30 NOTE — Patient Instructions (Signed)

## 2015-08-30 NOTE — Assessment & Plan Note (Signed)
History of hyperlipidemia on statin therapy followed by her PCP. 

## 2015-08-30 NOTE — Assessment & Plan Note (Signed)
History of paroxysmal atrial ablation maintaining sinus rhythm on Coumadin anticoagulation. She does complain of occasional palpitations especially during times of stress.

## 2015-08-30 NOTE — Progress Notes (Signed)
08/30/2015 Tara Cox   January 19, 1934  [id number]  Primary Physician Stephens Shire, MD Primary Cardiologist: Lorretta Harp MD [name]  HPI:  The patient is a delightful, 81 year old, mildly overweight, married [place] American female, mother of 3 children whose husband [name] is also a patient of mine. I saw her last 07/13/14.Marland Kitchen She has a history remarkable for paroxysmal atrial fib maintaining sinus rhythm on Coumadin anticoagulation, hypertension, hyperlipidemia, and lower extremity edema in the past. She has had a normal 2D echo and Myoview. An event monitor has shown PAF for which she was placed on Coumadin and low dose beta blocker. She was also put on low dose diuretic for lower extremity edema which resolved. She is otherwise asymptomatic. Since I saw her 1 year ago she denies chest pain or shortness of breath. Her Coumadin is followed by her primary care physician. She gets occasional palpitations during times of stress but has maintained sinus rhythm.    Current Outpatient Prescriptions  Medication Sig Dispense Refill  . aspirin EC 81 MG tablet Take 81 mg by mouth daily.    Marland Kitchen atorvastatin (LIPITOR) 20 MG tablet Take 20 mg by mouth daily at 6 PM.    . Calcium Carb-Cholecalciferol 600-800 MG-UNIT TABS Take 1 tablet by mouth daily.    . cholecalciferol (VITAMIN D) 1000 UNITS tablet Take 1,000 Units by mouth daily.    . colchicine 0.6 MG tablet Take 0.6 mg by mouth daily.    Marland Kitchen dextromethorphan (DELSYM) 30 MG/5ML liquid Take 30 mg by mouth daily as needed for cough.    . diphenhydrAMINE (BENADRYL) 25 mg capsule Take 25 mg by mouth every 6 (six) hours as needed for allergies.    . furosemide (LASIX) 20 MG tablet Take 20 mg by mouth daily.      Marland Kitchen gabapentin (NEURONTIN) 300 MG capsule Take 300 mg by mouth at bedtime.    . hydroxychloroquine (PLAQUENIL) 200 MG tablet Take 200 mg by mouth 2 (two) times daily.    Marland Kitchen losartan-hydrochlorothiazide (HYZAAR) 100-25 MG per tablet  Take 1 tablet by mouth every morning.     . magnesium oxide (MAG-OX) 400 MG tablet Take 400 mg by mouth daily.     . metoprolol tartrate (LOPRESSOR) 25 MG tablet Take 12.5 mg by mouth 2 (two) times daily. Patient takes 1/2 in am and 1/2 in pm    . Multiple Vitamin (MULTIVITAMIN WITH MINERALS) TABS tablet Take 1 tablet by mouth daily.    Marland Kitchen omeprazole (PRILOSEC) 40 MG capsule Take 1 capsule by mouth daily as needed.  12  . OVER THE COUNTER MEDICATION Take 1 tablet by mouth daily as needed (Pain). Mygrafew (health food store)    . PE-Shark Liver Oil-Cocoa Buttr (HEMORRHOIDAL RELIEF RE) Place 1 application rectally daily as needed (Hemorrhoids).     . potassium chloride SA (K-DUR,KLOR-CON) 20 MEQ tablet Take 10 mEq by mouth daily.    Marland Kitchen triamcinolone cream (KENALOG) 0.1 % Apply 1 application topically 2 (two) times daily as needed (Vaginal Itching).    . vitamin C (ASCORBIC ACID) 500 MG tablet Take 500 mg by mouth daily.    Marland Kitchen warfarin (COUMADIN) 5 MG tablet Take 0.5-1 tablets (2.5-5 mg total) by mouth daily. Take 5 mg tablet daily or as directed by pharmacist from [facility] (Patient taking differently: Take 2.5-5 mg by mouth daily. Takes 5mg  every day except for Thursday takes 2.5mg ) 40 tablet 1   No current facility-administered medications for  this visit.     Allergies  Allergen Reactions  . Oxycodone Nausea And Vomiting    Full body weakness   . Percocet [Oxycodone-Acetaminophen] Itching    Pt says she can take tylenol   . Soma [Carisoprodol] Nausea And Vomiting  . Ultram [Tramadol Hcl] Nausea And Vomiting    Social History   Social History  . Marital status: Married    Spouse name: N/A  . Number of children: N/A  . Years of education: N/A   Occupational History  . Not on file.   Social History Main Topics  . Smoking status: Former Smoker    Quit date: 02/19/1990  . Smokeless tobacco: Never Used     Comment: ' i QUIT SMOKING MANY YEARS AGO "  . Alcohol use No  . Drug  use: No  . Sexual activity: Not on file   Other Topics Concern  . Not on file   Social History Narrative  . No narrative on file     Review of Systems: General: negative for chills, fever, night sweats or weight changes.  Cardiovascular: negative for chest pain, dyspnea on exertion, edema, orthopnea, palpitations, paroxysmal nocturnal dyspnea or shortness of breath Dermatological: negative for rash Respiratory: negative for cough or wheezing Urologic: negative for hematuria Abdominal: negative for nausea, vomiting, diarrhea, bright red blood per rectum, melena, or hematemesis Neurologic: negative for visual changes, syncope, or dizziness All other systems reviewed and are otherwise negative except as noted above.    Blood pressure 133/74, pulse 73, height 5\' 7"  (1.702 m), weight 238 lb (108 kg).  General appearance: alert and no distress Neck: no adenopathy, no carotid bruit, no JVD, supple, symmetrical, trachea midline and thyroid not enlarged, symmetric, no tenderness/mass/nodules Lungs: clear to auscultation bilaterally Heart: regular rate and rhythm, S1, S2 normal, no murmur, click, rub or gallop Extremities: extremities normal, atraumatic, no cyanosis or edema  EKG normal sinus rhythm at 73 without ST or T-wave changes. She does have poor R-wave progression. I personally reviewed this EKG  ASSESSMENT AND PLAN:   Essential hypertension History of hypertension with blood pressure measured 133/74. She is on losartan, hydrochlorothiazide and metoprolol. Continue current meds at current dosing  Paroxysmal atrial fibrillation History of paroxysmal atrial ablation maintaining sinus rhythm on Coumadin anticoagulation. She does complain of occasional palpitations especially during times of stress.  Hyperlipidemia History of hyperlipidemia on statin therapy followed by her PCP      Lorretta Harp MD [facility], [facility] 08/30/2015 11:15 AM

## 2015-09-22 LAB — HEPATIC FUNCTION PANEL
ALK PHOS: 78 U/L (ref 25–125)
ALT: 41 U/L — AB (ref 7–35)
AST: 28 U/L (ref 13–35)
BILIRUBIN, TOTAL: 0.5 mg/dL

## 2015-09-22 LAB — BASIC METABOLIC PANEL
BUN: 19 mg/dL (ref 4–21)
CREATININE: 0.9 mg/dL (ref 0.5–1.1)
GLUCOSE: 113 mg/dL
Potassium: 4.2 mmol/L (ref 3.4–5.3)
SODIUM: 142 mmol/L (ref 137–147)

## 2015-09-22 LAB — CBC AND DIFFERENTIAL
HCT: 34 % — AB (ref 36–46)
Hemoglobin: 13.9 g/dL (ref 12.0–16.0)
Platelets: 179 10*3/uL (ref 150–399)
WBC: 5.2 10^3/mL

## 2015-09-27 ENCOUNTER — Encounter (HOSPITAL_COMMUNITY): Payer: Self-pay | Admitting: Emergency Medicine

## 2015-09-27 ENCOUNTER — Emergency Department (HOSPITAL_COMMUNITY)
Admission: EM | Admit: 2015-09-27 | Discharge: 2015-09-27 | Disposition: A | Discharge status: Home / Self Care | Payer: Medicare Other | Attending: Emergency Medicine | Admitting: Emergency Medicine

## 2015-09-27 DIAGNOSIS — Z79899 Other long term (current) drug therapy: Secondary | ICD-10-CM | POA: Diagnosis not present

## 2015-09-27 DIAGNOSIS — R339 Retention of urine, unspecified: Secondary | ICD-10-CM | POA: Insufficient documentation

## 2015-09-27 DIAGNOSIS — Z7982 Long term (current) use of aspirin: Secondary | ICD-10-CM | POA: Insufficient documentation

## 2015-09-27 DIAGNOSIS — I10 Essential (primary) hypertension: Secondary | ICD-10-CM | POA: Diagnosis not present

## 2015-09-27 DIAGNOSIS — Z96651 Presence of right artificial knee joint: Secondary | ICD-10-CM | POA: Diagnosis not present

## 2015-09-27 DIAGNOSIS — Z7901 Long term (current) use of anticoagulants: Secondary | ICD-10-CM | POA: Insufficient documentation

## 2015-09-27 DIAGNOSIS — R338 Other retention of urine: Secondary | ICD-10-CM

## 2015-09-27 LAB — URINE MICROSCOPIC-ADD ON

## 2015-09-27 LAB — URINALYSIS, ROUTINE W REFLEX MICROSCOPIC
Bilirubin Urine: NEGATIVE
GLUCOSE, UA: NEGATIVE mg/dL
KETONES UR: NEGATIVE mg/dL
Leukocytes, UA: NEGATIVE
Nitrite: NEGATIVE
PROTEIN: NEGATIVE mg/dL
Specific Gravity, Urine: 1.015 (ref 1.005–1.030)
pH: 6.5 (ref 5.0–8.0)

## 2015-09-27 MED ORDER — HYDROCORTISONE ACETATE 25 MG RE SUPP
25.0000 mg | Freq: Two times a day (BID) | RECTAL | 0 refills | Status: DC
Start: 2015-09-27 — End: 2016-08-29

## 2015-09-27 MED ORDER — LIDOCAINE 4 % EX CREA
TOPICAL_CREAM | Freq: Once | CUTANEOUS | Status: AC
  Administered 2015-09-27: 1 via TOPICAL
  Filled 2015-09-27: qty 5

## 2015-09-27 NOTE — ED Notes (Signed)
Patient verbalized understanding of discharge instructions and denies any further needs or questions at this time. VS stable. Patient ambulatory with steady gait. RN escorted to ED entrance.   

## 2015-09-27 NOTE — ED Notes (Signed)
Gave pt iced water, per Sabrina-RN.

## 2015-09-27 NOTE — ED Notes (Signed)
MD at bedside. 

## 2015-09-27 NOTE — ED Triage Notes (Signed)
Pt reports she has pressure and pain and need to void but cannot. States she has hemorrhoids saw Dr. Gershon Crane for them yesterday. He gave her px for suppository. States she placed this morning and made it worse. Last voided around 0500 this morning; had to strain.

## 2015-09-27 NOTE — Discharge Instructions (Signed)
Your difficulty urinating may be due to the medication (phenylephrine) in your suppository.  Please stop this medication.  Follow up with urologist in the next 2-3 days for further evaluation.  Use Anusol as needed for rectal discomfort from hemorrhoid.  Follow up with Dr. Molli Posey as needed if your hemorrhoid continues to bother you.

## 2015-09-27 NOTE — ED Provider Notes (Signed)
[facility] DEPT Provider Note   CSN: [id number] Arrival date & time: 09/27/15  1231     History   Chief Complaint Chief Complaint  Patient presents with  . Urinary Retention    HPI Tara Cox is a 81 y.o. female.  HPI   81 year old female with history of frequent urination at night, hypertension, IBS, A. fib on warfarin presenting with complaints of urinary retention. Patient report acute urinary discomfort and urinary retention that started this morning and has persisted. She reports feeling the urge to urinate but unable to. She also complaining of abdominal distention throughout the day as urine buildup. She tries to urinate and in the process, it irritates her hemorrhoid. She denies having any fever, nausea, vomiting, constipation, hematuria or having flank pain. She has been urinate normally leading up to this event. States she is currently on Lasix and normally would have urinate at least 4 times today. She reports remote history of urinary retention in 1967 when she had a miscarriage. States it was transient and resolved on its own. She reports she is having trouble with hemorrhoids irritation due to taking several medication that makes the use the bathroom more frequent. She went to see her surgeon, Dr. Molli Posey  yesterday who evaluated the hemorrhoids and felt that it does not need any immediate treatment. He did prescribe some types of suppository which is too expensive for her to purchase at this time. While she is in the ER, a Foley was placed which relieved her symptoms. Patient currently denies any changes in her medication, no history of kidney stone, no back pain, no history of cancer and denies constipation.  Past Medical History:  Diagnosis Date  . A-fib ([place])   . Arthritis   . Edema of lower extremity 06/29/09   Lower Venous Exam- normal. No evidence of thrombus or thrombophlebitis.  . Frequent urination at night   . GERD (gastroesophageal reflux disease)   .  Hemorrhoids   . Hypertension 02/24/09   Echo-EF 62%; Myocardial Perfusion Study-Normal. No signifcant ischemia noted.  . IBS (irritable bowel syndrome)   . Migraine   . PONV (postoperative nausea and vomiting)     Patient Active Problem List   Diagnosis Date Noted  . Essential hypertension 06/26/2013  . Paroxysmal atrial fibrillation ([place]) 06/26/2013  . Hyperlipidemia 06/26/2013  . DJD (degenerative joint disease) of knee 01/26/2013  . Hemorrhoids 07/11/2010  . IBS (irritable bowel syndrome) 07/11/2010  . Fatigue/loss of sleep 07/11/2010  . Night sweats 07/11/2010  . Chills 07/11/2010  . Weight gain 07/11/2010  . Arthritis 07/11/2010  . Incontinence of urine 07/11/2010  . Thrombosed external hemorrhoids 07/11/2010    Past Surgical History:  Procedure Laterality Date  . ABDOMINAL HYSTERECTOMY  1974  . APPENDECTOMY    . BACK SURGERY  2001  . BUNIONECTOMY  1986  . CESAREAN SECTION  1971  . COLONOSCOPY    . COLONOSCOPY WITH PROPOFOL N/A 05/28/2014   Procedure: COLONOSCOPY WITH PROPOFOL;  Surgeon: Carol Ada, MD;  Location: [facility];  Service: Endoscopy;  Laterality: N/A;  . ESOPHAGOGASTRODUODENOSCOPY (EGD) WITH PROPOFOL N/A 05/28/2014   Procedure: ESOPHAGOGASTRODUODENOSCOPY (EGD) WITH PROPOFOL;  Surgeon: Carol Ada, MD;  Location: [facility];  Service: Endoscopy;  Laterality: N/A;  . EXPLORATORY LAPAROTOMY     open procedure  . EYE SURGERY Bilateral    cataract removal  . TOTAL KNEE ARTHROPLASTY Right 01/26/2013   DR Ronnie Derby  . TOTAL KNEE ARTHROPLASTY Right 01/26/2013   Procedure: TOTAL KNEE ARTHROPLASTY;  Surgeon: Vickey Huger, MD;  Location: [facility];  Service: Orthopedics;  Laterality: Right;  . UTERINE FIBROID SURGERY  1967    OB History    No data available       Home Medications    Prior to Admission medications   Medication Sig Start Date End Date Taking? Authorizing Provider  aspirin EC 81 MG tablet Take 81 mg by mouth daily.    Historical Provider, MD    atorvastatin (LIPITOR) 20 MG tablet Take 20 mg by mouth daily at 6 PM.    Historical Provider, MD  Calcium Carb-Cholecalciferol 600-800 MG-UNIT TABS Take 1 tablet by mouth daily.    Historical Provider, MD  cholecalciferol (VITAMIN D) 1000 UNITS tablet Take 1,000 Units by mouth daily.    Historical Provider, MD  colchicine 0.6 MG tablet Take 0.6 mg by mouth daily.    Historical Provider, MD  dextromethorphan (DELSYM) 30 MG/5ML liquid Take 30 mg by mouth daily as needed for cough.    Historical Provider, MD  diphenhydrAMINE (BENADRYL) 25 mg capsule Take 25 mg by mouth every 6 (six) hours as needed for allergies.    Historical Provider, MD  furosemide (LASIX) 20 MG tablet Take 20 mg by mouth daily.      Historical Provider, MD  gabapentin (NEURONTIN) 300 MG capsule Take 300 mg by mouth at bedtime.    Historical Provider, MD  hydroxychloroquine (PLAQUENIL) 200 MG tablet Take 200 mg by mouth 2 (two) times daily.    Historical Provider, MD  losartan-hydrochlorothiazide (HYZAAR) 100-25 MG per tablet Take 1 tablet by mouth every morning.     Historical Provider, MD  magnesium oxide (MAG-OX) 400 MG tablet Take 400 mg by mouth daily.     Historical Provider, MD  metoprolol tartrate (LOPRESSOR) 25 MG tablet Take 12.5 mg by mouth 2 (two) times daily. Patient takes 1/2 in am and 1/2 in pm    Historical Provider, MD  Multiple Vitamin (MULTIVITAMIN WITH MINERALS) TABS tablet Take 1 tablet by mouth daily.    Historical Provider, MD  omeprazole (PRILOSEC) 40 MG capsule Take 1 capsule by mouth daily as needed. 06/23/14   Historical Provider, MD  OVER THE COUNTER MEDICATION Take 1 tablet by mouth daily as needed (Pain). Mygrafew (health food store)    Historical Provider, MD  PE-Shark Liver Oil-Cocoa Buttr (HEMORRHOIDAL RELIEF RE) Place 1 application rectally daily as needed (Hemorrhoids).     Historical Provider, MD  potassium chloride SA (K-DUR,KLOR-CON) 20 MEQ tablet Take 10 mEq by mouth daily.    Historical  Provider, MD  triamcinolone cream (KENALOG) 0.1 % Apply 1 application topically 2 (two) times daily as needed (Vaginal Itching).    Historical Provider, MD  vitamin C (ASCORBIC ACID) 500 MG tablet Take 500 mg by mouth daily.    Historical Provider, MD  warfarin (COUMADIN) 5 MG tablet Take 0.5-1 tablets (2.5-5 mg total) by mouth daily. Take 5 mg tablet daily or as directed by pharmacist from [facility] Patient taking differently: Take 2.5-5 mg by mouth daily. Takes 5mg  every day except for Thursday takes 2.5mg  01/28/13   Allen Norris, PA-C    Family History Family History  Problem Relation Age of Onset  . Heart disease Father     heart attack  . Diabetes Sister   . Other Sister     pacemaker  . Diabetes Sister   . Heart disease Sister     Social History Social History  Substance Use Topics  . Smoking  status: Former Smoker    Quit date: 02/19/1990  . Smokeless tobacco: Never Used     Comment: ' i QUIT SMOKING MANY YEARS AGO "  . Alcohol use No     Allergies   Oxycodone; Percocet [oxycodone-acetaminophen]; Soma [carisoprodol]; and Ultram [tramadol hcl]   Review of Systems Review of Systems  All other systems reviewed and are negative.    Physical Exam Updated Vital Signs BP 165/77 (BP Location: Left Arm)   Pulse 82   Temp 97.9 F (36.6 C) (Oral)   Resp 20   Ht 5\' 8"  (1.727 m)   Wt 106.6 kg   SpO2 100%   BMI 35.73 kg/m   Physical Exam  Constitutional: She appears well-developed and well-nourished. No distress.  HENT:  Head: Atraumatic.  Eyes: Conjunctivae are normal.  Neck: Neck supple.  Cardiovascular: Normal rate and regular rhythm.   Pulmonary/Chest: Effort normal and breath sounds normal.  Abdominal: Soft. There is tenderness (Very mild epigastric tenderness without guarding or rebound tenderness).  Genitourinary:  Genitourinary Comments: Chaperone present during exam. Foley catheter in place no external changes to the urethra. On rectal  examination, patient has evidence of nonthrombosed external hemorrhoid and internal hemorrhoid without mass. No rectal bleeding. Intact rectal tone.  Musculoskeletal:  No midline spine tenderness.  No flank pain  Neurological: She is alert.  Patella DTR 2+ bilaterally, no foot drops. 5/5 strength to BLE  Skin: No rash noted.  Psychiatric: She has a normal mood and affect.  Nursing note and vitals reviewed.    ED Treatments / Results  Labs (all labs ordered are listed, but only abnormal results are displayed) Labs Reviewed  URINALYSIS, ROUTINE W REFLEX MICROSCOPIC (NOT AT [facility]) - Abnormal; Notable for the following:       Result Value   Hgb urine dipstick TRACE (*)    All other components within normal limits  URINE MICROSCOPIC-ADD ON - Abnormal; Notable for the following:    Squamous Epithelial / LPF 0-5 (*)    Bacteria, UA RARE (*)    All other components within normal limits    EKG  EKG Interpretation None       Radiology No results found.  Procedures Procedures (including critical care time)  Medications Ordered in ED Medications - No data to display   Initial Impression / Assessment and Plan / ED Course  I have reviewed the triage vital signs and the nursing notes.  Pertinent labs & imaging results that were available during my care of the patient were reviewed by me and considered in my medical decision making (see chart for details).  Clinical Course    BP 140/58   Pulse 86   Temp 98.5 F (36.9 C)   Resp 18   Ht 5\' 8"  (1.727 m)   Wt 106.6 kg   SpO2 100%   BMI 35.73 kg/m    Final Clinical Impressions(s) / ED Diagnoses   Final diagnoses:  Acute urinary retention    New Prescriptions New Prescriptions   HYDROCORTISONE (ANUSOL-HC) 25 MG SUPPOSITORY    Place 1 suppository (25 mg total) rectally 2 (two) times daily. For 7 days   3:34 PM Pt here with acute onset of urinary retention without any obvious aggravating factors (medication changes,  kidney stone, cancer, caudal equina, constipation, etc...). Pain relieved with foley catheter placement.  Pt felt better. Denies dysuria leading to this event.  Will check UA, pt will be d/c with indwelling foley with leg bag and close f/u  with urology in 1 week for further evaluation.  Care discussed with Dr. Billy Fischer.    4:51 PM Pt recently started to use an OTC suppository for her hemorrhoid that contains phenylephrine.  Suspect this medication may cause urinary retention.  Pt instructed to stop using this medication.  Instruct pt to f/u with urologist in 2-3 days for further care.  Anusol prescribed for relief of her hemorrhoid.  Return precaution discussed.  Pt's UA is unremarkable.     Domenic Moras, PA-C 09/27/15 1729    Gareth Morgan, MD 09/28/15 [phone]

## 2015-10-15 ENCOUNTER — Other Ambulatory Visit: Payer: Self-pay | Admitting: Radiology

## 2015-10-15 DIAGNOSIS — Z79899 Other long term (current) drug therapy: Secondary | ICD-10-CM

## 2015-12-05 ENCOUNTER — Telehealth: Payer: Self-pay | Admitting: Rheumatology

## 2015-12-05 NOTE — Telephone Encounter (Signed)
Can stopping and restarting PLQ cause Anxiety? I am not familiar with this side effect, would like to advise her to discuss anxiety with PCP.

## 2015-12-05 NOTE — Telephone Encounter (Signed)
Patient states she went off of plaquenil and has been back on it for a few days, please call patient about symptoms she having (anxiety).

## 2015-12-05 NOTE — Telephone Encounter (Signed)
She should discuss anxiety with the PCP. Probably not related to Plaquenil.

## 2015-12-05 NOTE — Telephone Encounter (Signed)
Thank you, I have called patient to advise. She states Sept she was having hemorrhoids and rectal bleeding, she states she thought it was from Colchicine/ PLQ combination. She has discussed with her primary care office regarding this. They have treated the hemorrhoid now, and she is better. She had d/c the Colchicine and the PLQ . She has now tried to resume the PLQ and now she has some Anxiety. She states she resumed PLQ 3 d ago and feels better now. We had a very long conversation  I have advised her only to use colchicine prn gout flare Advised her to resume the PLQ, she states she will resume but only wants to use one per day, I told her this is fine Also advised her to make a follow up appt and come in for labs   FYI only

## 2015-12-08 ENCOUNTER — Encounter: Payer: Self-pay | Admitting: *Deleted

## 2015-12-08 ENCOUNTER — Ambulatory Visit: Payer: Self-pay | Admitting: Rheumatology

## 2015-12-08 DIAGNOSIS — K449 Diaphragmatic hernia without obstruction or gangrene: Secondary | ICD-10-CM

## 2015-12-08 DIAGNOSIS — M19042 Primary osteoarthritis, left hand: Secondary | ICD-10-CM | POA: Insufficient documentation

## 2015-12-08 DIAGNOSIS — M1120 Other chondrocalcinosis, unspecified site: Secondary | ICD-10-CM | POA: Insufficient documentation

## 2015-12-08 DIAGNOSIS — Z8759 Personal history of other complications of pregnancy, childbirth and the puerperium: Secondary | ICD-10-CM

## 2015-12-08 DIAGNOSIS — M51369 Other intervertebral disc degeneration, lumbar region without mention of lumbar back pain or lower extremity pain: Secondary | ICD-10-CM

## 2015-12-08 DIAGNOSIS — Z96651 Presence of right artificial knee joint: Secondary | ICD-10-CM

## 2015-12-08 DIAGNOSIS — D259 Leiomyoma of uterus, unspecified: Secondary | ICD-10-CM

## 2015-12-08 DIAGNOSIS — M5136 Other intervertebral disc degeneration, lumbar region: Secondary | ICD-10-CM

## 2015-12-08 DIAGNOSIS — M19072 Primary osteoarthritis, left ankle and foot: Secondary | ICD-10-CM

## 2015-12-08 DIAGNOSIS — M2241 Chondromalacia patellae, right knee: Secondary | ICD-10-CM | POA: Insufficient documentation

## 2015-12-08 DIAGNOSIS — K209 Esophagitis, unspecified without bleeding: Secondary | ICD-10-CM | POA: Insufficient documentation

## 2015-12-08 DIAGNOSIS — Z8739 Personal history of other diseases of the musculoskeletal system and connective tissue: Secondary | ICD-10-CM

## 2015-12-08 DIAGNOSIS — M2240 Chondromalacia patellae, unspecified knee: Secondary | ICD-10-CM

## 2015-12-08 DIAGNOSIS — M19071 Primary osteoarthritis, right ankle and foot: Secondary | ICD-10-CM

## 2015-12-08 DIAGNOSIS — M2242 Chondromalacia patellae, left knee: Secondary | ICD-10-CM

## 2015-12-08 DIAGNOSIS — M19041 Primary osteoarthritis, right hand: Secondary | ICD-10-CM

## 2015-12-08 HISTORY — DX: Personal history of other diseases of the musculoskeletal system and connective tissue: Z87.39

## 2015-12-08 HISTORY — DX: Esophagitis, unspecified without bleeding: K20.90

## 2015-12-08 HISTORY — DX: Primary osteoarthritis, right hand: M19.042

## 2015-12-08 HISTORY — DX: Diaphragmatic hernia without obstruction or gangrene: K44.9

## 2015-12-08 HISTORY — DX: Primary osteoarthritis, right ankle and foot: M19.072

## 2015-12-08 HISTORY — DX: Personal history of other complications of pregnancy, childbirth and the puerperium: Z87.59

## 2015-12-08 HISTORY — DX: Primary osteoarthritis, right ankle and foot: M19.071

## 2015-12-08 HISTORY — DX: Leiomyoma of uterus, unspecified: D25.9

## 2015-12-08 HISTORY — DX: Other intervertebral disc degeneration, lumbar region: M51.36

## 2015-12-08 HISTORY — DX: Chondromalacia patellae, unspecified knee: M22.40

## 2015-12-08 HISTORY — DX: Primary osteoarthritis, right hand: M19.041

## 2015-12-08 HISTORY — DX: Other intervertebral disc degeneration, lumbar region without mention of lumbar back pain or lower extremity pain: M51.369

## 2015-12-08 HISTORY — DX: Presence of right artificial knee joint: Z96.651

## 2015-12-09 NOTE — Progress Notes (Deleted)
Office Visit Note  Patient: Tara Cox             Date of Birth: [birth year]           MRN: [id number]             PCP: Stephens Shire, MD Referring: Stephens Shire, MD Visit Date: 12/12/2015 Occupation: Retired customer service    Subjective:  Left thumb pain.   History of Present Illness: Tara Cox is a 81 y.o. female with the history of sero gout and autoimmune disease. She states some while she was taking Plaquenil for her inflammatory arthritis and colchicine was pseudogout she developed diarrhea. She stopped her Plaquenil and colchicine. Last area persists she was given a suppository she states while on suppository she developed urinary retention. The symptoms eventually got better but she felt that she was having withdrawal from stopping Plaquenil and colchicine. She states she was having symptoms of anxiety and night sweats. She restarted Plaquenil in November 2017 due to a flare in with swelling in her left ankle joint and right wrist joint. She restarted Plaquenil and decrease it to 200 mg a day which she is tolerating well and her joint symptoms have improved. She's not taking colchicine at this time. She's been having some discomfort in her left thumb over the [facility] joint. She has some discomfort in her left first and second toe. It usually happens at night.  Activities of Daily Living:  Patient reports morning stiffness for 10 minutes.   Patient Denies nocturnal pain.  Difficulty dressing/grooming: Denies Difficulty climbing stairs: Denies Difficulty getting out of chair: Reports Difficulty using hands for taps, buttons, cutlery, and/or writing: Reports   Review of Systems  Constitutional: Negative for fatigue, night sweats, weight gain, weight loss and weakness.  HENT: Positive for mouth dryness. Negative for mouth sores, trouble swallowing, trouble swallowing and nose dryness.   Eyes: Positive for dryness. Negative for pain, redness and visual disturbance.    Respiratory: Negative for cough, shortness of breath and difficulty breathing.   Cardiovascular: Negative for chest pain, palpitations, hypertension, irregular heartbeat and swelling in legs/feet.  Gastrointestinal: Negative for blood in stool, constipation and diarrhea.  Endocrine: Negative for increased urination.  Genitourinary: Negative for vaginal dryness.  Musculoskeletal: Positive for arthralgias, joint pain and morning stiffness. Negative for joint swelling, myalgias, muscle weakness, muscle tenderness and myalgias.  Skin: Positive for hair loss. Negative for color change, rash, skin tightness, ulcers and sensitivity to sunlight.  Allergic/Immunologic: Negative for susceptible to infections.  Neurological: Negative for dizziness, memory loss and night sweats.  Hematological: Negative for swollen glands.  Psychiatric/Behavioral: Negative for depressed mood and sleep disturbance. The patient is not nervous/anxious.     PMFS History:  Patient Active Problem List   Diagnosis Date Noted  . Uterine fibroid 12/08/2015  . History of miscarriage 12/08/2015  . Esophagitis 12/08/2015  . Diaphragmatic hernia 12/08/2015  . History of calcium pyrophosphate deposition disease (CPPD) 12/08/2015  . Osteoarthritis of both hands 12/08/2015  . Osteoarthritis of both feet 12/08/2015  . History of total right knee replacement 12/08/2015  . 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 DDD (degenerative disc disease), lumbar 12/08/2015  . Essential hypertension 06/26/2013  . Paroxysmal atrial fibrillation ([place]) 06/26/2013  . Hyperlipidemia 06/26/2013  . DJD (degenerative joint disease) of knee 01/26/2013  . Hemorrhoids 07/11/2010  . IBS (irritable bowel syndrome) 07/11/2010  . Fatigue/loss of sleep 07/11/2010  . Night sweats 07/11/2010  . Chills 07/11/2010  . Weight  gain 07/11/2010  . Arthritis 07/11/2010  . Incontinence of urine 07/11/2010  . Thrombosed external hemorrhoids 07/11/2010    Past  Medical History:  Diagnosis Date  . A-fib ([place])   . Arthritis   . 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 DDD (degenerative disc disease), lumbar 12/08/2015   S/P discectomy   . Diaphragmatic hernia 12/08/2015  . Edema of lower extremity 06/29/09   Lower Venous Exam- normal. No evidence of thrombus or thrombophlebitis.  . Esophagitis 12/08/2015  . Frequent urination at night   . GERD (gastroesophageal reflux disease)   . Hemorrhoids   . History of calcium pyrophosphate deposition disease (CPPD) 12/08/2015  . History of miscarriage 12/08/2015   x3  . History of total right knee replacement 12/08/2015  . Hypertension 02/24/09   Echo-EF 62%; Myocardial Perfusion Study-Normal. No signifcant ischemia noted.  . IBS (irritable bowel syndrome)   . Migraine   . Osteoarthritis of both feet 12/08/2015  . Osteoarthritis of both hands 12/08/2015  . PONV (postoperative nausea and vomiting)   . Uterine fibroid 12/08/2015    Family History  Problem Relation Age of Onset  . Other Mother   . Varicose Veins Mother   . Emphysema Mother   . Heart disease Father     heart attack  . Diabetes Sister   . Other Sister     pacemaker  . Diabetes Sister   . Heart disease Sister    Past Surgical History:  Procedure Laterality Date  . ABDOMINAL HYSTERECTOMY  1974  . APPENDECTOMY    . BACK SURGERY  2001  . BUNIONECTOMY  1986  . CESAREAN SECTION  1971  . COLONOSCOPY    . COLONOSCOPY WITH PROPOFOL N/A 05/28/2014   Procedure: COLONOSCOPY WITH PROPOFOL;  Surgeon: Carol Ada, MD;  Location: [facility];  Service: Endoscopy;  Laterality: N/A;  . ESOPHAGOGASTRODUODENOSCOPY (EGD) WITH PROPOFOL N/A 05/28/2014   Procedure: ESOPHAGOGASTRODUODENOSCOPY (EGD) WITH PROPOFOL;  Surgeon: Carol Ada, MD;  Location: [facility];  Service: Endoscopy;  Laterality: N/A;  . EXPLORATORY LAPAROTOMY     open procedure  . EYE SURGERY Bilateral    cataract removal  . TOTAL KNEE ARTHROPLASTY Right 01/26/2013   DR Ronnie Derby    . TOTAL KNEE ARTHROPLASTY Right 01/26/2013   Procedure: TOTAL KNEE ARTHROPLASTY;  Surgeon: Vickey Huger, MD;  Location: [place];  Service: Orthopedics;  Laterality: Right;  . [place]   Social History   Social History Narrative  . No narrative on file     Objective: Vital Signs: BP (!) 147/86 (BP Location: Left Arm, Patient Position: Sitting, Cuff Size: Large)   Pulse 65   Resp 14   Ht 5\' 8"  (1.727 m)   Wt 234 lb (106.1 kg)   BMI 35.58 kg/m    Physical Exam  Constitutional: She is oriented to person, place, and time. She appears well-developed and well-nourished.  HENT:  Head: Normocephalic and atraumatic.  Eyes: Conjunctivae and EOM are normal.  Neck: Normal range of motion.  Cardiovascular: Normal rate, regular rhythm, normal heart sounds and intact distal pulses.   Pulmonary/Chest: Effort normal and breath sounds normal.  Abdominal: Soft. Bowel sounds are normal.  Lymphadenopathy:    She has no cervical adenopathy.  Neurological: She is alert and oriented to person, place, and time.  Skin: Skin is warm and dry. Capillary refill takes less than 2 seconds.  Psychiatric: She has a normal mood and affect. Her behavior is normal.  Nursing note and vitals reviewed.  Musculoskeletal Exam: C-spine, thoracic, lumbar spine good range of motion she has discomfort range of motion of her lumbar spine her bilateral shoulder joints with good range of motion although she has discomfort with range of motion of her right shoulder joint elbow joints wrist joint MCPs PIPs DIPs with good range of motion she has some tenderness over right CMC joint. With no swelling she is good range of motion of her hip joints knee joints ankle joints MTPs PIPs DIPs she has thickening of PIP/DIP joints. Right total knee replacement is doing well.  CDAI Exam: CDAI Homunculus Exam:   Joint Counts:  CDAI Tender Joint count: 0 CDAI Swollen Joint count: 0  Global Assessments:  Patient  Global Assessment: 2 Provider Global Assessment: 2  CDAI Calculated Score: 4    Investigation: Findings:  09/21/2015 CBC CMP normal except for ALT elevated 41 December 2016; CBC, comprehensive metabolic panel, sed rate, magnesium, UA, iron studies anti-gliadin, uric acid, rheumatoid factor, CCP, ACE level are all within normal limits.  Her ANA was positive at 1:160 nuclear homogenous pattern.   01/25/2015 RNP positive 2.6     Imaging: No results found.  Speciality Comments: No specialty comments available.    Procedures:  No procedures performed Allergies: Oxycodone; Percocet [oxycodone-acetaminophen]; Soma [carisoprodol]; Ultram [tramadol hcl]; and Hydrocodone   Assessment / Plan:     Visit Diagnoses: History of calcium pyrophosphate deposition disease (CPPD): She has not had any recent flares of pseudogout she does have colchicine available patient will take only on when necessary basis she has discontinued colchicine after her last bout of diarrhea.  Inflammatory arthritis -   She has positive ANA and history of inflammatory arthritis : She is doing really well on low-dose Plaquenil. She had a flare of arthritis on she was off Plaquenil. She is getting her eye exams. We'll get her labs today.  Primary osteoarthritis of both knees: Her right total knee replacement is doing well. She is some discomfort in her left knee joint with mobility.  History of total right knee replacement  DDD (degenerative disc disease), lumbar: She does have some chronic lower back pain.  Right shoulder pain, most likely tendinopathy. I offered physical therapy which she declined she states she does have some exercises which she's been doing at the gym.  Essential hypertension: Her blood pressure is elevated today have advised her to follow up with her PCP.  ANA positive  High risk medication use: She is on Plaquenil one tablet by mouth daily which she is tolerating well.  Her other medical  problems are as follows for which she's seeing other physicians:  Anticoagulated : It causes some bruising., IBS, atrial fibrillation, hyperlipidemia, esophagitis.   Orders: Orders Placed This Encounter  Procedures  . CBC with Differential/Platelet  . COMPLETE METABOLIC PANEL WITH GFR   No orders of the defined types were placed in this encounter.   Face-to-face time spent with patient was 30 minutes. 50% of time was spent in counseling and coordination of care.  Follow-Up Instructions: Return in about 5 months (around 05/11/2016) for Autoimmune disease.   Bo Merino, MD

## 2015-12-12 ENCOUNTER — Encounter: Payer: Self-pay | Admitting: Rheumatology

## 2015-12-12 ENCOUNTER — Ambulatory Visit (INDEPENDENT_AMBULATORY_CARE_PROVIDER_SITE_OTHER): Payer: Medicare Other | Admitting: Rheumatology

## 2015-12-12 VITALS — BP 147/86 | HR 65 | Resp 14 | Ht 68.0 in | Wt 234.0 lb

## 2015-12-12 DIAGNOSIS — Z7901 Long term (current) use of anticoagulants: Secondary | ICD-10-CM

## 2015-12-12 DIAGNOSIS — G8929 Other chronic pain: Secondary | ICD-10-CM

## 2015-12-12 DIAGNOSIS — I10 Essential (primary) hypertension: Secondary | ICD-10-CM

## 2015-12-12 DIAGNOSIS — Z79899 Other long term (current) drug therapy: Secondary | ICD-10-CM

## 2015-12-12 DIAGNOSIS — M1990 Unspecified osteoarthritis, unspecified site: Secondary | ICD-10-CM | POA: Diagnosis not present

## 2015-12-12 DIAGNOSIS — M25511 Pain in right shoulder: Secondary | ICD-10-CM

## 2015-12-12 DIAGNOSIS — M170 Bilateral primary osteoarthritis of knee: Secondary | ICD-10-CM | POA: Diagnosis not present

## 2015-12-12 DIAGNOSIS — M5136 Other intervertebral disc degeneration, lumbar region: Secondary | ICD-10-CM

## 2015-12-12 DIAGNOSIS — Z8739 Personal history of other diseases of the musculoskeletal system and connective tissue: Secondary | ICD-10-CM

## 2015-12-12 DIAGNOSIS — R768 Other specified abnormal immunological findings in serum: Secondary | ICD-10-CM

## 2015-12-12 DIAGNOSIS — Z96651 Presence of right artificial knee joint: Secondary | ICD-10-CM

## 2015-12-12 LAB — COMPLETE METABOLIC PANEL WITH GFR
ALT: 27 U/L (ref 6–29)
AST: 27 U/L (ref 10–35)
Albumin: 4.4 g/dL (ref 3.6–5.1)
Alkaline Phosphatase: 87 U/L (ref 33–130)
BILIRUBIN TOTAL: 0.8 mg/dL (ref 0.2–1.2)
BUN: 14 mg/dL (ref 7–25)
CHLORIDE: 101 mmol/L (ref 98–110)
CO2: 28 mmol/L (ref 20–31)
CREATININE: 0.85 mg/dL (ref 0.60–0.93)
Calcium: 10 mg/dL (ref 8.6–10.4)
GFR, Est African American: 75 mL/min (ref >=60)
GFR, Est Non African American: 65 mL/min (ref >=60)
GLUCOSE: 94 mg/dL (ref 65–99)
Potassium: 4.2 mmol/L (ref 3.5–5.3)
SODIUM: 140 mmol/L (ref 135–146)
TOTAL PROTEIN: 7.1 g/dL (ref 6.1–8.1)

## 2015-12-12 LAB — CBC WITH DIFFERENTIAL/PLATELET
BASOS PCT: 0 %
Basophils Absolute: 0 cells/uL (ref 0–200)
EOS ABS: 58 {cells}/uL (ref 15–500)
EOS PCT: 1 %
HCT: 46.1 % — ABNORMAL HIGH (ref 35.0–45.0)
Hemoglobin: 14.8 g/dL (ref 11.7–15.5)
Lymphocytes Relative: 58 %
Lymphs Abs: 3364 cells/uL (ref 850–3900)
MCH: 27 pg (ref 27.0–33.0)
MCHC: 32.1 g/dL (ref 32.0–36.0)
MCV: 84 fL (ref 80.0–100.0)
MONOS PCT: 6 %
MPV: 10.8 fL (ref 7.5–12.5)
Monocytes Absolute: 348 cells/uL (ref 200–950)
NEUTROS ABS: 2030 {cells}/uL (ref 1500–7800)
Neutrophils Relative %: 35 %
PLATELETS: 183 10*3/uL (ref 140–400)
RBC: 5.49 MIL/uL — AB (ref 3.80–5.10)
RDW: 14.4 % (ref 11.0–15.0)
WBC: 5.8 10*3/uL (ref 3.8–10.8)

## 2015-12-12 NOTE — Progress Notes (Signed)
Rheumatology Medication Review by a Pharmacist Does the patient feel that his/her medications are working for him/her?  Yes Has the patient been experiencing any side effects to the medications prescribed?  Yes Does the patient have any problems obtaining medications?  No   Pharmacist comments:  Tara Cox is a pleasant 81yo F who presents for follow up.  Patient reports she is currently taking hydroxychloroquine 200 mg daily.  She reports back in September she had hemorrhoids due to diarrhea.  Patient felt that the diarrhea was due to the hydroxychloroquine and colchicine.  She reports she stopped both hydroxychloroquine and colchicine.  She reports she restarted the hydroxychloroquine in November as she was having a flare in her left ankle joint and right wrist joint.  She restarted hydroxychloroquine 200 mg twice daily but reports having anxiety.  She reduced the dose to 200 mg daily and reports doing well.  Patient denied any other medication related questions or concerns at this time.    Patient's most recent standing labs were in September 2017.  Patient will be due for standing labs at this time.  Will place standing lab orders.     Elisabeth Most, Pharm.D., BCPS Clinical Pharmacist Pager: [phone] Phone: [phone] 12/12/2015 8:58 AM

## 2015-12-12 NOTE — Patient Instructions (Signed)
Standing Labs We placed an order today for your standing lab work.    Please come back and get your standing labs in May 2018.    We have open lab Monday through Friday from 8:30-11:30 AM and 1-4 PM at the office of Dr. Tresa Moore, PA.   The office is located at [address], [place], [place], [place] [address] No appointment is necessary.   Labs are drawn by [facility].  You may receive a bill from [facility] for your lab work.

## 2015-12-12 NOTE — Progress Notes (Signed)
Office Visit Note  Patient: Tara Cox             Date of Birth: [birth year]           MRN: [id number]             PCP: Stephens Shire, MD Referring: Stephens Shire, MD Visit Date: 12/12/2015 Occupation: Retired customer service    Subjective:  Left thumb pain.   History of Present Illness: Tara Cox is a 81 y.o. female with the history of sero gout and autoimmune disease. She states some while she was taking Plaquenil for her inflammatory arthritis and colchicine was pseudogout she developed diarrhea. She stopped her Plaquenil and colchicine. Last area persists she was given a suppository she states while on suppository she developed urinary retention. The symptoms eventually got better but she felt that she was having withdrawal from stopping Plaquenil and colchicine. She states she was having symptoms of anxiety and night sweats. She restarted Plaquenil in November 2017 due to a flare in with swelling in her left ankle joint and right wrist joint. She restarted Plaquenil and decrease it to 200 mg a day which she is tolerating well and her joint symptoms have improved. She's not taking colchicine at this time. She's been having some discomfort in her left thumb over the [facility] joint. She has some discomfort in her left first and second toe. It usually happens at night.  Activities of Daily Living:  Patient reports morning stiffness for 10 minutes.   Patient Denies nocturnal pain.  Difficulty dressing/grooming: Denies Difficulty climbing stairs: Denies Difficulty getting out of chair: Reports Difficulty using hands for taps, buttons, cutlery, and/or writing: Reports   Review of Systems  Constitutional: Negative for fatigue, night sweats, weight gain, weight loss and weakness.  HENT: Positive for mouth dryness. Negative for mouth sores, trouble swallowing, trouble swallowing and nose dryness.   Eyes: Positive for dryness. Negative for pain, redness and visual disturbance.    Respiratory: Negative for cough, shortness of breath and difficulty breathing.   Cardiovascular: Negative for chest pain, palpitations, hypertension, irregular heartbeat and swelling in legs/feet.  Gastrointestinal: Negative for blood in stool, constipation and diarrhea.  Endocrine: Negative for increased urination.  Genitourinary: Negative for vaginal dryness.  Musculoskeletal: Positive for arthralgias, joint pain and morning stiffness. Negative for joint swelling, myalgias, muscle weakness, muscle tenderness and myalgias.  Skin: Positive for hair loss. Negative for color change, rash, skin tightness, ulcers and sensitivity to sunlight.  Allergic/Immunologic: Negative for susceptible to infections.  Neurological: Negative for dizziness, memory loss and night sweats.  Hematological: Negative for swollen glands.  Psychiatric/Behavioral: Negative for depressed mood and sleep disturbance. The patient is not nervous/anxious.     PMFS History:  Patient Active Problem List   Diagnosis Date Noted  . Uterine fibroid 12/08/2015  . History of miscarriage 12/08/2015  . Esophagitis 12/08/2015  . Diaphragmatic hernia 12/08/2015  . History of calcium pyrophosphate deposition disease (CPPD) 12/08/2015  . Osteoarthritis of both hands 12/08/2015  . Osteoarthritis of both feet 12/08/2015  . History of total right knee replacement 12/08/2015  . 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 DDD (degenerative disc disease), lumbar 12/08/2015  . Essential hypertension 06/26/2013  . Paroxysmal atrial fibrillation ([place]) 06/26/2013  . Hyperlipidemia 06/26/2013  . DJD (degenerative joint disease) of knee 01/26/2013  . Hemorrhoids 07/11/2010  . IBS (irritable bowel syndrome) 07/11/2010  . Fatigue/loss of sleep 07/11/2010  . Night sweats 07/11/2010  . Chills 07/11/2010  . Weight  gain 07/11/2010  . Arthritis 07/11/2010  . Incontinence of urine 07/11/2010  . Thrombosed external hemorrhoids 07/11/2010    Past  Medical History:  Diagnosis Date  . A-fib ([place])   . Arthritis   . 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 DDD (degenerative disc disease), lumbar 12/08/2015   S/P discectomy   . Diaphragmatic hernia 12/08/2015  . Edema of lower extremity 06/29/09   Lower Venous Exam- normal. No evidence of thrombus or thrombophlebitis.  . Esophagitis 12/08/2015  . Frequent urination at night   . GERD (gastroesophageal reflux disease)   . Hemorrhoids   . History of calcium pyrophosphate deposition disease (CPPD) 12/08/2015  . History of miscarriage 12/08/2015   x3  . History of total right knee replacement 12/08/2015  . Hypertension 02/24/09   Echo-EF 62%; Myocardial Perfusion Study-Normal. No signifcant ischemia noted.  . IBS (irritable bowel syndrome)   . Migraine   . Osteoarthritis of both feet 12/08/2015  . Osteoarthritis of both hands 12/08/2015  . PONV (postoperative nausea and vomiting)   . Uterine fibroid 12/08/2015    Family History  Problem Relation Age of Onset  . Other Mother   . Varicose Veins Mother   . Emphysema Mother   . Heart disease Father     heart attack  . Diabetes Sister   . Other Sister     pacemaker  . Diabetes Sister   . Heart disease Sister    Past Surgical History:  Procedure Laterality Date  . ABDOMINAL HYSTERECTOMY  1974  . APPENDECTOMY    . BACK SURGERY  2001  . BUNIONECTOMY  1986  . CESAREAN SECTION  1971  . COLONOSCOPY    . COLONOSCOPY WITH PROPOFOL N/A 05/28/2014   Procedure: COLONOSCOPY WITH PROPOFOL;  Surgeon: Carol Ada, MD;  [facility];  Service: Endoscopy;  Laterality: N/A;  . ESOPHAGOGASTRODUODENOSCOPY (EGD) WITH PROPOFOL N/A 05/28/2014   Procedure: ESOPHAGOGASTRODUODENOSCOPY (EGD) WITH PROPOFOL;  Surgeon: Carol Ada, MD;  [facility];  Service: Endoscopy;  Laterality: N/A;  . EXPLORATORY LAPAROTOMY     open procedure  . EYE SURGERY Bilateral    cataract removal  . TOTAL KNEE ARTHROPLASTY Right 01/26/2013   DR Ronnie Derby    . TOTAL KNEE ARTHROPLASTY Right 01/26/2013   Procedure: TOTAL KNEE ARTHROPLASTY;  Surgeon: Vickey Huger, MD;  Location: [facility];  Service: Orthopedics;  Laterality: Right;  . [place]   Social History   Social History Narrative  . No narrative on file     Objective: Vital Signs: BP (!) 147/86 (BP Location: Left Arm, Patient Position: Sitting, Cuff Size: Large)   Pulse 65   Resp 14   Ht 5\' 8"  (1.727 m)   Wt 234 lb (106.1 kg)   BMI 35.58 kg/m    Physical Exam  Constitutional: She is oriented to person, place, and time. She appears well-developed and well-nourished.  HENT:  Head: Normocephalic and atraumatic.  Eyes: Conjunctivae and EOM are normal.  Neck: Normal range of motion.  Cardiovascular: Normal rate, regular rhythm, normal heart sounds and intact distal pulses.   Pulmonary/Chest: Effort normal and breath sounds normal.  Abdominal: Soft. Bowel sounds are normal.  Lymphadenopathy:    She has no cervical adenopathy.  Neurological: She is alert and oriented to person, place, and time.  Skin: Skin is warm and dry. Capillary refill takes less than 2 seconds.  Psychiatric: She has a normal mood and affect. Her behavior is normal.  Nursing note and vitals reviewed.  Musculoskeletal Exam: C-spine, thoracic, lumbar spine good range of motion she has discomfort range of motion of her lumbar spine her bilateral shoulder joints with good range of motion although she has discomfort with range of motion of her right shoulder joint elbow joints wrist joint MCPs PIPs DIPs with good range of motion she has some tenderness over right CMC joint. With no swelling she is good range of motion of her hip joints knee joints ankle joints MTPs PIPs DIPs she has thickening of PIP/DIP joints. Right total knee replacement is doing well.  CDAI Exam: CDAI Homunculus Exam:   Joint Counts:  CDAI Tender Joint count: 0 CDAI Swollen Joint count: 0  Global Assessments:  Patient  Global Assessment: 2 Provider Global Assessment: 2  CDAI Calculated Score: 4    Investigation: Findings:  09/21/2015 CBC CMP normal except for ALT elevated 41 December 2016; CBC, comprehensive metabolic panel, sed rate, magnesium, UA, iron studies anti-gliadin, uric acid, rheumatoid factor, CCP, ACE level are all within normal limits.  Her ANA was positive at 1:160 nuclear homogenous pattern.   01/25/2015 RNP positive 2.6     Imaging: No results found.  Speciality Comments: No specialty comments available.    Procedures:  No procedures performed Allergies: Oxycodone; Percocet [oxycodone-acetaminophen]; Soma [carisoprodol]; Ultram [tramadol hcl]; and Hydrocodone   Assessment / Plan:     Visit Diagnoses: History of calcium pyrophosphate deposition disease (CPPD): She has not had any recent flares of pseudogout she does have colchicine available patient will take only on when necessary basis she has discontinued colchicine after her last bout of diarrhea.  Inflammatory arthritis -   She has positive ANA and history of inflammatory arthritis : She is doing really well on low-dose Plaquenil. She had a flare of arthritis on she was off Plaquenil. She is getting her eye exams. We'll get her labs today.  Primary osteoarthritis of both knees: Her right total knee replacement is doing well. She is some discomfort in her left knee joint with mobility.  History of total right knee replacement  DDD (degenerative disc disease), lumbar: She does have some chronic lower back pain.  Right shoulder pain, most likely tendinopathy. I offered physical therapy which she declined she states she does have some exercises which she's been doing at the gym.  Essential hypertension: Her blood pressure is elevated today have advised her to follow up with her PCP.  ANA positive  High risk medication use: She is on Plaquenil one tablet by mouth daily which she is tolerating well.  Her other medical  problems are as follows for which she's seeing other physicians:  Anticoagulated : It causes some bruising., IBS, atrial fibrillation, hyperlipidemia, esophagitis.   Orders: Orders Placed This Encounter  Procedures  . CBC with Differential/Platelet  . COMPLETE METABOLIC PANEL WITH GFR   No orders of the defined types were placed in this encounter.   Face-to-face time spent with patient was 30 minutes. 50% of time was spent in counseling and coordination of care.  Follow-Up Instructions: Return in about 5 months (around 05/11/2016) for Autoimmune disease.   Bo Merino, MD

## 2015-12-13 NOTE — Progress Notes (Signed)
Labs normal.

## 2016-02-21 ENCOUNTER — Other Ambulatory Visit: Payer: Self-pay | Admitting: Rheumatology

## 2016-02-21 NOTE — Telephone Encounter (Signed)
Last Visit: 12/12/15 Next Visit: 05/11/16  Labs: 12/12/15 WNL  Okay to refill Colchicine?

## 2016-03-06 ENCOUNTER — Other Ambulatory Visit: Payer: Self-pay | Admitting: Orthopedic Surgery

## 2016-03-06 DIAGNOSIS — R52 Pain, unspecified: Secondary | ICD-10-CM

## 2016-03-15 ENCOUNTER — Ambulatory Visit
Admission: RE | Admit: 2016-03-15 | Discharge: 2016-03-15 | Disposition: A | Discharge status: Home / Self Care | Payer: Medicare Other | Source: Ambulatory Visit | Attending: Orthopedic Surgery | Admitting: Orthopedic Surgery

## 2016-03-15 DIAGNOSIS — R52 Pain, unspecified: Secondary | ICD-10-CM

## 2016-05-07 DIAGNOSIS — Z79899 Other long term (current) drug therapy: Secondary | ICD-10-CM | POA: Insufficient documentation

## 2016-05-07 NOTE — Progress Notes (Signed)
 Office Visit Note  Patient: Tara Cox             Date of Birth: 10/28/1934           MRN: [id number]             PCP: WILSON,FRED HENRY, MD Referring: Burnett, Brent A, MD Visit Date: 05/11/2016 Occupation: @GUAROCC@    Subjective:  Pain in bilateral feet    History of Present Illness: Tara Cox is a 81 y.o. female with history of inflammatory arthritis and pseudogout.Right heel pain started 2 days ago she started soaking her foot in the salt water and Epsom salts. She states that the symptoms are improving. She has some discomfort in her left foot on the plantar surface. It is not keeping her from walking. She had right rotator cuff tear repair about 6 weeks ago by Dr. Lucy. She is recovering well. She still has swelling from that. She reports swelling in bilateral ankles.  Activities of Daily Living:  Patient reports morning stiffness for5  minutes.   Patient Denies nocturnal pain.  Difficulty dressing/grooming: Denies Difficulty climbing stairs: Denies Difficulty getting out of chair: Reports Difficulty using hands for taps, buttons, cutlery, and/or writing: Denies   Review of Systems  Constitutional: Positive for fatigue. Negative for night sweats, weight gain, weight loss and weakness.       Since her recent surgery  HENT: Positive for mouth dryness. Negative for mouth sores, trouble swallowing, trouble swallowing and nose dryness.   Eyes: Negative for pain, redness, visual disturbance and dryness.  Respiratory: Negative for cough, shortness of breath and difficulty breathing.   Cardiovascular: Negative for chest pain, palpitations, hypertension, irregular heartbeat and swelling in legs/feet.  Gastrointestinal: Negative for blood in stool, constipation and diarrhea.  Endocrine: Negative for increased urination.  Genitourinary: Negative for vaginal dryness.  Musculoskeletal: Positive for arthralgias, joint pain, joint swelling and morning stiffness. Negative for  myalgias, muscle weakness, muscle tenderness and myalgias.  Skin: Negative for color change, rash, hair loss, skin tightness, ulcers and sensitivity to sunlight.  Allergic/Immunologic: Negative for susceptible to infections.  Neurological: Negative for dizziness, memory loss and night sweats.  Hematological: Negative for swollen glands.  Psychiatric/Behavioral: Positive for sleep disturbance. Negative for depressed mood. The patient is not nervous/anxious.     PMFS History:  Patient Active Problem List   Diagnosis Date Noted  . Complete tear of right rotator cuff 05/11/2016  . High risk medication use 05/07/2016  . Uterine fibroid 12/08/2015  . History of miscarriage 12/08/2015  . Esophagitis 12/08/2015  . Diaphragmatic hernia 12/08/2015  . History of calcium pyrophosphate deposition disease (CPPD) 12/08/2015  . Osteoarthritis of both hands 12/08/2015  . Osteoarthritis of both feet 12/08/2015  . History of total right knee replacement 12/08/2015  . 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 DDD (degenerative disc disease), lumbar 12/08/2015  . Essential hypertension 06/26/2013  . Paroxysmal atrial fibrillation (HCC) 06/26/2013  . Hyperlipidemia 06/26/2013  . DJD (degenerative joint disease) of knee 01/26/2013  . Hemorrhoids 07/11/2010  . IBS (irritable bowel syndrome) 07/11/2010  . Fatigue/loss of sleep 07/11/2010  . Night sweats 07/11/2010  . Chills 07/11/2010  . Weight gain 07/11/2010  . Inflammatory arthritis 07/11/2010  . Incontinence of urine 07/11/2010  . Thrombosed external hemorrhoids 07/11/2010    Past Medical History:  Diagnosis Date  . A-fib (HCC)   . Arthritis   . 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 DDD (degenerative disc disease), lumbar 12/08/2015   S/P discectomy   .   Diaphragmatic hernia 12/08/2015  . Edema of lower extremity 06/29/09   Lower Venous Exam- normal. No evidence of thrombus or thrombophlebitis.  . Esophagitis 12/08/2015  . Frequent urination at  night   . GERD (gastroesophageal reflux disease)   . Hemorrhoids   . History of calcium pyrophosphate deposition disease (CPPD) 12/08/2015  . History of miscarriage 12/08/2015   x3  . History of total right knee replacement 12/08/2015  . Hypertension 02/24/09   Echo-EF 62%; Myocardial Perfusion Study-Normal. No signifcant ischemia noted.  . IBS (irritable bowel syndrome)   . Migraine   . Osteoarthritis of both feet 12/08/2015  . Osteoarthritis of both hands 12/08/2015  . PONV (postoperative nausea and vomiting)   . Uterine fibroid 12/08/2015    Family History  Problem Relation Age of Onset  . Other Mother   . Varicose Veins Mother   . Emphysema Mother   . Heart disease Father     heart attack  . Diabetes Sister   . Other Sister     pacemaker  . Diabetes Sister   . Heart disease Sister    Past Surgical History:  Procedure Laterality Date  . ABDOMINAL HYSTERECTOMY  1974  . APPENDECTOMY    . BACK SURGERY  2001  . BUNIONECTOMY  1986  . CESAREAN SECTION  1971  . COLONOSCOPY    . COLONOSCOPY WITH PROPOFOL N/A 05/28/2014   Procedure: COLONOSCOPY WITH PROPOFOL;  Surgeon: Patrick Hung, MD;  Location: [facility];  Service: Endoscopy;  Laterality: N/A;  . ESOPHAGOGASTRODUODENOSCOPY (EGD) WITH PROPOFOL N/A 05/28/2014   Procedure: ESOPHAGOGASTRODUODENOSCOPY (EGD) WITH PROPOFOL;  Surgeon: Patrick Hung, MD;  Location: [facility];  Service: Endoscopy;  Laterality: N/A;  . EXPLORATORY LAPAROTOMY     open procedure  . EYE SURGERY Bilateral    cataract removal  . TOTAL KNEE ARTHROPLASTY Right 01/26/2013   DR LUCEY  . TOTAL KNEE ARTHROPLASTY Right 01/26/2013   Procedure: TOTAL KNEE ARTHROPLASTY;  Surgeon: Steve Lucey, MD;  Location: [facility];  Service: Orthopedics;  Laterality: Right;  . TOTAL SHOULDER ARTHROPLASTY    . UTERINE FIBROID SURGERY  1967   Social History   Social History Narrative  . No narrative on file     Objective: Vital Signs: BP 128/64 (BP Location: Left Arm,  Patient Position: Sitting, Cuff Size: Normal)   Pulse 66   Resp 14   Ht 5' 7.5" (1.715 m)   Wt 246 lb (111.6 kg)   BMI 37.96 kg/m    Physical Exam  Constitutional: She is oriented to person, place, and time. She appears well-developed and well-nourished.  HENT:  Head: Normocephalic and atraumatic.  Eyes: Conjunctivae and EOM are normal.  Neck: Normal range of motion.  Cardiovascular: Normal rate, regular rhythm, normal heart sounds and intact distal pulses.   Pulmonary/Chest: Effort normal and breath sounds normal.  Abdominal: Soft. Bowel sounds are normal.  Lymphadenopathy:    She has no cervical adenopathy.  Neurological: She is alert and oriented to person, place, and time.  Skin: Skin is warm and dry. Capillary refill takes less than 2 seconds.  Psychiatric: She has a normal mood and affect. Her behavior is normal.  Nursing note and vitals reviewed.    Musculoskeletal Exam: C-spine and thoracic lumbar spine good range of motion. Shoulder joints right shoulder was in a sling left shoulder full range of motion, left elbow joint was good range of motion. She has good range of motion of bilateral wrist joints she had no MCP PIP/DIP synovitis.   She had thickening of bilateral DIP joints and CMC joints consistent with osteoarthritis. Hip joints knee joints ankles MTPs PIPs with good range of motion. She is some pedal edema. She also has some tenderness over right plantar fascia consistent with plantar fasciitis.  CDAI Exam: No CDAI exam completed.    Investigation: Findings:  12/12/2015 CBC normal, CMP normal    Imaging: No results found.  Speciality Comments: No specialty comments available.    Procedures:  No procedures performed Allergies: Oxycodone; Percocet [oxycodone-acetaminophen]; Soma [carisoprodol]; Ultram [tramadol hcl]; and Hydrocodone   Assessment / Plan:     Visit Diagnoses: Inflammatory arthritis - Positive ANA. She has no synovitis on examination today  she seems to be doing very well.  High risk medication use - Plaquenil - Plan: CMP14+EGFR, CBC with Differential/Platelet, I will check her labs today and then we can check in 5 months again.  Plantar fasciitis, bilateral: I discussed possible referral to physical therapy. Patient would like a handout which was given so she can do exercises at home. If her symptoms persist she can notify us and we can consider cortisone injection.  History of calcium pyrophosphate deposition disease (CPPD) - On colchicine when necessary for flares  Primary osteoarthritis of both hands: Joint protection and muscle strength in discussed.  Primary osteoarthritis of both feet proper fitting shoes with our support were discussed.  DDD (degenerative disc disease), lumbar: She's not having much discomfort in her lower back  History of total right knee replacement: Doing well  Complete tear of right rotator cuff - Status post repair April 2018 by Dr. Lucy: Recovering gradually  Mixed hyperlipidemia  Paroxysmal atrial fibrillation (HCC) - On warfarin  Essential hypertension  Irritable bowel syndrome, unspecified type    Orders: Orders Placed This Encounter  Procedures  . CMP14+EGFR  . CBC with Differential/Platelet   No orders of the defined types were placed in this encounter.   Face-to-face time spent with patient was 30 minutes. 50% of time was spent in counseling and coordination of care.  Follow-Up Instructions: Return in about 5 months (around 10/11/2016) for inflammatory arthritis, CPPD.   Shaili Deveshwar, MD  Note - This record has been created using Dragon software.  Chart creation errors have been sought, but may not always  have been located. Such creation errors do not reflect on  the standard of medical care. 

## 2016-05-11 ENCOUNTER — Ambulatory Visit (INDEPENDENT_AMBULATORY_CARE_PROVIDER_SITE_OTHER): Payer: Medicare Other | Admitting: Rheumatology

## 2016-05-11 ENCOUNTER — Encounter: Payer: Self-pay | Admitting: Rheumatology

## 2016-05-11 VITALS — BP 128/64 | HR 66 | Resp 14 | Ht 67.5 in | Wt 246.0 lb

## 2016-05-11 DIAGNOSIS — Z79899 Other long term (current) drug therapy: Secondary | ICD-10-CM

## 2016-05-11 DIAGNOSIS — M1990 Unspecified osteoarthritis, unspecified site: Secondary | ICD-10-CM | POA: Diagnosis not present

## 2016-05-11 DIAGNOSIS — M5136 Other intervertebral disc degeneration, lumbar region: Secondary | ICD-10-CM

## 2016-05-11 DIAGNOSIS — M19072 Primary osteoarthritis, left ankle and foot: Secondary | ICD-10-CM

## 2016-05-11 DIAGNOSIS — M75121 Complete rotator cuff tear or rupture of right shoulder, not specified as traumatic: Secondary | ICD-10-CM | POA: Diagnosis not present

## 2016-05-11 DIAGNOSIS — Z8739 Personal history of other diseases of the musculoskeletal system and connective tissue: Secondary | ICD-10-CM | POA: Diagnosis not present

## 2016-05-11 DIAGNOSIS — M19042 Primary osteoarthritis, left hand: Secondary | ICD-10-CM

## 2016-05-11 DIAGNOSIS — I10 Essential (primary) hypertension: Secondary | ICD-10-CM

## 2016-05-11 DIAGNOSIS — M19041 Primary osteoarthritis, right hand: Secondary | ICD-10-CM | POA: Diagnosis not present

## 2016-05-11 DIAGNOSIS — M19071 Primary osteoarthritis, right ankle and foot: Secondary | ICD-10-CM

## 2016-05-11 DIAGNOSIS — K589 Irritable bowel syndrome without diarrhea: Secondary | ICD-10-CM

## 2016-05-11 DIAGNOSIS — Z96651 Presence of right artificial knee joint: Secondary | ICD-10-CM

## 2016-05-11 DIAGNOSIS — M51369 Other intervertebral disc degeneration, lumbar region without mention of lumbar back pain or lower extremity pain: Secondary | ICD-10-CM

## 2016-05-11 DIAGNOSIS — E782 Mixed hyperlipidemia: Secondary | ICD-10-CM

## 2016-05-11 DIAGNOSIS — M722 Plantar fascial fibromatosis: Secondary | ICD-10-CM

## 2016-05-11 DIAGNOSIS — I480 Paroxysmal atrial fibrillation: Secondary | ICD-10-CM | POA: Diagnosis not present

## 2016-05-11 NOTE — Patient Instructions (Signed)

## 2016-08-29 ENCOUNTER — Ambulatory Visit (INDEPENDENT_AMBULATORY_CARE_PROVIDER_SITE_OTHER): Payer: Medicare Other | Admitting: Cardiovascular Disease

## 2016-08-29 ENCOUNTER — Telehealth: Payer: Self-pay | Admitting: *Deleted

## 2016-08-29 ENCOUNTER — Encounter: Payer: Self-pay | Admitting: Cardiovascular Disease

## 2016-08-29 VITALS — BP 124/64 | HR 82 | Ht 67.0 in | Wt 242.0 lb

## 2016-08-29 DIAGNOSIS — I10 Essential (primary) hypertension: Secondary | ICD-10-CM

## 2016-08-29 DIAGNOSIS — E7800 Pure hypercholesterolemia, unspecified: Secondary | ICD-10-CM

## 2016-08-29 DIAGNOSIS — I480 Paroxysmal atrial fibrillation: Secondary | ICD-10-CM

## 2016-08-29 NOTE — Assessment & Plan Note (Signed)
History of paroxysmal atrial fibrillation maintaining sinus rhythm on Coumadin anticoagulation managed by her PCP

## 2016-08-29 NOTE — Patient Instructions (Signed)

## 2016-08-29 NOTE — Progress Notes (Signed)
08/29/2016 Tara Cox   [birth year]  [id number]  Primary Physician [name], MD Primary Cardiologist: Lorretta Harp MD Lupe Carney, [place]  HPI:  Tara Cox is a 82 y.o. female mildly overweight, married [place] American female, mother of 3 children whose husband [name] is also a patient of mine. I saw her last 08/30/15.Marland Kitchen She has a history remarkable for paroxysmal atrial fib maintaining sinus rhythm on Coumadin anticoagulation, hypertension, hyperlipidemia, and lower extremity edema in the past. She has had a normal 2D echo and Myoview. An event monitor has shown PAF for which she was placed on Coumadin and low dose beta blocker. She was also put on low dose diuretic for lower extremity edema which resolved. She is otherwise asymptomatic. Since I saw her 1 year ago she denies chest pain or shortness of breath. Her Coumadin is followed by her primary care physician. She gets occasional palpitations during times of stress but has maintained sinus rhythm.   Current Meds  Medication Sig  . acetaminophen (TYLENOL) 325 MG tablet Take 2 tablets by mouth at bedtime as needed.  Marland Kitchen aspirin EC 81 MG tablet Take 81 mg by mouth daily.  Marland Kitchen atorvastatin (LIPITOR) 20 MG tablet Take by mouth daily at 6 PM.   . Calcium Carb-Cholecalciferol 600-800 MG-UNIT TABS Take 1 tablet by mouth 2 (two) times daily.   . cholecalciferol (VITAMIN D) 1000 UNITS tablet Take 3,000 Units by mouth daily.   . colchicine 0.6 MG tablet TAKE 1 TABLET BY MOUTH  EVERY DAY  . dextromethorphan (DELSYM) 30 MG/5ML liquid Take 30 mg by mouth daily as needed for cough.  . diphenhydrAMINE (BENADRYL) 25 mg capsule Take 25 mg by mouth every 6 (six) hours as needed for allergies.  . furosemide (LASIX) 20 MG tablet Take 20 mg by mouth daily.    Marland Kitchen gabapentin (NEURONTIN) 300 MG capsule Take 300 mg by mouth 2 (two) times daily.   Marland Kitchen HYDROcodone-acetaminophen (NORCO/VICODIN) 5-325 MG tablet   . hydrocortisone (ANUSOL-HC) 2.5 %  rectal cream Place 1 application rectally 2 (two) times daily as needed for hemorrhoids or itching.  . hydroxychloroquine (PLAQUENIL) 200 MG tablet Take 200 mg by mouth daily.   Marland Kitchen losartan-hydrochlorothiazide (HYZAAR) 100-25 MG per tablet Take 1 tablet by mouth every morning.   . magnesium oxide (MAG-OX) 400 MG tablet Take 400 mg by mouth daily.   . metoprolol tartrate (LOPRESSOR) 25 MG tablet Take 12.5 mg by mouth 2 (two) times daily. Patient takes 1/2 in am and 1/2 in pm  . Multiple Vitamin (MULTIVITAMIN) tablet Take 1 tablet by mouth daily.  Marland Kitchen omeprazole (PRILOSEC) 40 MG capsule Take 1 capsule by mouth daily as needed.  Marland Kitchen OVER THE COUNTER MEDICATION Take 1 tablet by mouth daily as needed (Pain). Mygrafew (health food store)  . potassium chloride SA (K-DUR,KLOR-CON) 20 MEQ tablet Take 10 mEq by mouth daily.  Marland Kitchen triamcinolone cream (KENALOG) 0.1 % Apply 1 application topically 2 (two) times daily as needed (Vaginal Itching).  . vitamin C (ASCORBIC ACID) 500 MG tablet Take 500 mg by mouth daily.  Marland Kitchen warfarin (COUMADIN) 5 MG tablet Take 0.5-1 tablets (2.5-5 mg total) by mouth daily. Take 5 mg tablet daily or as directed by pharmacist from [facility] (Patient taking differently: Take 2.5-5 mg by mouth daily. Takes 5mg  every day except for Thursday takes 2.5mg )     Allergies  Allergen Reactions  . Oxycodone Nausea And Vomiting    Full body  weakness   . Percocet [Oxycodone-Acetaminophen] Itching    Pt says she can take tylenol   . Soma [Carisoprodol] Nausea And Vomiting  . Ultram [Tramadol Hcl] Nausea And Vomiting  . Hydrocodone Other (See Comments)    Reports they make her feel sick/drowsy    Social History   Social History  . Marital status: Married    Spouse name: N/A  . Number of children: N/A  . Years of education: N/A   Occupational History  . Not on file.   Social History Main Topics  . Smoking status: Former Smoker    Packs/day: 1.00    Years: 20.00    Types:  Cigarettes    Quit date: 02/19/1990  . Smokeless tobacco: Never Used     Comment: ' i QUIT SMOKING MANY YEARS AGO "  . Alcohol use No  . Drug use: No  . Sexual activity: Not on file   Other Topics Concern  . Not on file   Social History Narrative  . No narrative on file     Review of Systems: General: negative for chills, fever, night sweats or weight changes.  Cardiovascular: negative for chest pain, dyspnea on exertion, edema, orthopnea, palpitations, paroxysmal nocturnal dyspnea or shortness of breath Dermatological: negative for rash Respiratory: negative for cough or wheezing Urologic: negative for hematuria Abdominal: negative for nausea, vomiting, diarrhea, bright red blood per rectum, melena, or hematemesis Neurologic: negative for visual changes, syncope, or dizziness All other systems reviewed and are otherwise negative except as noted above.    Blood pressure 124/64, pulse 82, height 5\' 7"  (1.702 m), weight 242 lb (109.8 kg).  General appearance: alert and no distress Neck: no adenopathy, no carotid bruit, no JVD, supple, symmetrical, trachea midline and thyroid not enlarged, symmetric, no tenderness/mass/nodules Lungs: clear to auscultation bilaterally Heart: regular rate and rhythm, S1, S2 normal, no murmur, click, rub or gallop Extremities: extremities normal, atraumatic, no cyanosis or edema  EKG sinus rhythm 82 with PACs and nonspecific ST-T wave changes. I personally reviewed this EKG.  ASSESSMENT AND PLAN:   Essential hypertension History of essential hypertension blood pressure measured at 124/64. She is on losartan and hydrochlorothiazide as well as metoprolol. Continue current meds are current dosing  Paroxysmal atrial fibrillation History of paroxysmal atrial fibrillation maintaining sinus rhythm on Coumadin anticoagulation managed by her PCP  Hyperlipidemia History of hyperlipidemia on statin therapy managed by her PCP      Lorretta Harp MD  [facility], [facility] 08/29/2016 12:05 PM

## 2016-08-29 NOTE — Assessment & Plan Note (Signed)
History of hyperlipidemia on statin therapy managed by her PCP

## 2016-08-29 NOTE — Telephone Encounter (Signed)
CMP drawn on 05/30/16 by PCP WNL

## 2016-08-29 NOTE — Assessment & Plan Note (Signed)
History of essential hypertension blood pressure measured at 124/64. She is on losartan and hydrochlorothiazide as well as metoprolol. Continue current meds are current dosing

## 2016-08-30 ENCOUNTER — Telehealth: Payer: Self-pay | Admitting: *Deleted

## 2016-08-30 NOTE — Telephone Encounter (Signed)
CMP from PCP WNL on 05/30/16

## 2016-09-25 ENCOUNTER — Other Ambulatory Visit: Payer: Self-pay | Admitting: Rheumatology

## 2016-09-25 MED ORDER — HYDROXYCHLOROQUINE SULFATE 200 MG PO TABS: 200.0000 mg | ORAL_TABLET | Freq: Two times a day (BID) | ORAL | 0 refills | Status: DC

## 2016-09-25 NOTE — Telephone Encounter (Signed)
Patient is requesting refill of PLQ to be sent to [facility]. Patient states she is completely out and [facility] told her they had sent refill requests to our office.

## 2016-09-25 NOTE — Telephone Encounter (Signed)
Last Visit: 05/11/16 Next Visit: 10/11/16 Labs: 05/30/16 WNL PLQ Eye Exam: 06/2016 WNL  Patient will have update PLQ Eye exam sent to the office.   Okay to refill per Dr. Estanislado Pandy

## 2016-09-27 DIAGNOSIS — F5101 Primary insomnia: Secondary | ICD-10-CM | POA: Insufficient documentation

## 2016-09-27 DIAGNOSIS — R768 Other specified abnormal immunological findings in serum: Secondary | ICD-10-CM | POA: Insufficient documentation

## 2016-09-27 NOTE — Progress Notes (Deleted)
Office Visit Note  Patient: Tara Cox             Date of Birth: 09-30-1934           MRN: [id number]             PCP: [name], MD Referring: [name], MD Visit Date: 10/11/2016 Occupation: @GUAROCC @    Subjective:  No chief complaint on file.   History of Present Illness: Tara Cox is a 82 y.o. female ***   Activities of Daily Living:  Patient reports morning stiffness for *** {minute/hour:19697}.   Patient {ACTIONS;DENIES/REPORTS:21021675::"Denies"} nocturnal pain.  Difficulty dressing/grooming: {ACTIONS;DENIES/REPORTS:21021675::"Denies"} Difficulty climbing stairs: {ACTIONS;DENIES/REPORTS:21021675::"Denies"} Difficulty getting out of chair: {ACTIONS;DENIES/REPORTS:21021675::"Denies"} Difficulty using hands for taps, buttons, cutlery, and/or writing: {ACTIONS;DENIES/REPORTS:21021675::"Denies"}   No Rheumatology ROS completed.   PMFS History:  Patient Active Problem List   Diagnosis Date Noted  . Primary insomnia 09/27/2016  . ANA positive 09/27/2016  . Complete tear of right rotator cuff 05/11/2016  . High risk medication use 05/07/2016  . Uterine fibroid 12/08/2015  . History of miscarriage 12/08/2015  . Esophagitis 12/08/2015  . Diaphragmatic hernia 12/08/2015  . Calcium pyrophosphate deposition disease 12/08/2015  . Osteoarthritis of both hands 12/08/2015  . Osteoarthritis of both feet 12/08/2015  . History of total right knee replacement 12/08/2015  . Chondromalacia of both patellae 12/08/2015  . DDD (degenerative disc disease), lumbar 12/08/2015  . Essential hypertension 06/26/2013  . Paroxysmal atrial fibrillation ([place]) 06/26/2013  . Hyperlipidemia 06/26/2013  . DJD (degenerative joint disease) of knee 01/26/2013  . Hemorrhoids 07/11/2010  . IBS (irritable bowel syndrome) 07/11/2010  . Fatigue/loss of sleep 07/11/2010  . Night sweats 07/11/2010  . Chills 07/11/2010  . Weight gain 07/11/2010  . Inflammatory arthritis 07/11/2010    . Incontinence of urine 07/11/2010  . Thrombosed external hemorrhoids 07/11/2010    Past Medical History:  Diagnosis Date  . A-fib ([place])   . Arthritis   . 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 DDD (degenerative disc disease), lumbar 12/08/2015   S/P discectomy   . Diaphragmatic hernia 12/08/2015  . Edema of lower extremity 06/29/09   Lower Venous Exam- normal. No evidence of thrombus or thrombophlebitis.  . Esophagitis 12/08/2015  . Frequent urination at night   . GERD (gastroesophageal reflux disease)   . Hemorrhoids   . History of calcium pyrophosphate deposition disease (CPPD) 12/08/2015  . History of miscarriage 12/08/2015   x3  . History of total right knee replacement 12/08/2015  . Hypertension 02/24/09   Echo-EF 62%; Myocardial Perfusion Study-Normal. No signifcant ischemia noted.  . IBS (irritable bowel syndrome)   . Migraine   . Osteoarthritis of both feet 12/08/2015  . Osteoarthritis of both hands 12/08/2015  . PONV (postoperative nausea and vomiting)   . Uterine fibroid 12/08/2015    Family History  Problem Relation Age of Onset  . Other Mother   . Varicose Veins Mother   . Emphysema Mother   . Heart disease Father        heart attack  . Diabetes Sister   . Other Sister        pacemaker  . Diabetes Sister   . Heart disease Sister    Past Surgical History:  Procedure Laterality Date  . ABDOMINAL HYSTERECTOMY  1974  . APPENDECTOMY    . BACK SURGERY  2001  . BUNIONECTOMY  1986  . CESAREAN SECTION  1971  . COLONOSCOPY    . COLONOSCOPY WITH PROPOFOL N/A 05/28/2014  Procedure: COLONOSCOPY WITH PROPOFOL;  Surgeon: Carol Ada, MD;  Location: [facility];  Service: Endoscopy;  Laterality: N/A;  . ESOPHAGOGASTRODUODENOSCOPY (EGD) WITH PROPOFOL N/A 05/28/2014   Procedure: ESOPHAGOGASTRODUODENOSCOPY (EGD) WITH PROPOFOL;  Surgeon: Carol Ada, MD;  Location: [facility];  Service: Endoscopy;  Laterality: N/A;  . EXPLORATORY LAPAROTOMY     open procedure  .  EYE SURGERY Bilateral    cataract removal  . TOTAL KNEE ARTHROPLASTY Right 01/26/2013   DR Ronnie Derby  . TOTAL KNEE ARTHROPLASTY Right 01/26/2013   Procedure: TOTAL KNEE ARTHROPLASTY;  Surgeon: Vickey Huger, MD;  Location: [place];  Service: Orthopedics;  Laterality: Right;  . TOTAL SHOULDER ARTHROPLASTY    . [place]   Social History   Social History Narrative  . No narrative on file     Objective: Vital Signs: There were no vitals taken for this visit.   Physical Exam   Musculoskeletal Exam: ***  CDAI Exam: No CDAI exam completed.    Investigation: Findings:  06/30/15 Plaquenil eye exam normal   CBC Latest Ref Rng & Units 12/12/2015 09/22/2015 01/28/2013  WBC 3.8 - 10.8 K/uL 5.8 5.2 12.1(H)  Hemoglobin 11.7 - 15.5 g/dL 14.8 13.9 10.7(L)  Hematocrit 35.0 - 45.0 % 46.1(H) 34(A) 33.3(L)  Platelets 140 - 400 K/uL 183 179 152   CMP Latest Ref Rng & Units 12/12/2015 09/22/2015 01/28/2013  Glucose 65 - 99 mg/dL 94 - 100(H)  BUN 7 - 25 mg/dL 14 19 12   Creatinine 0.60 - 0.93 mg/dL 0.85 0.9 0.70  Sodium 135 - 146 mmol/L 140 142 141  Potassium 3.5 - 5.3 mmol/L 4.2 4.2 4.1  Chloride 98 - 110 mmol/L 101 - 103  CO2 20 - 31 mmol/L 28 - 27  Calcium 8.6 - 10.4 mg/dL 10.0 - 8.5  Total Protein 6.1 - 8.1 g/dL 7.1 - -  Total Bilirubin 0.2 - 1.2 mg/dL 0.8 - -  Alkaline Phos 33 - 130 U/L 87 78 -  AST 10 - 35 U/L 27 28 -  ALT 6 - 29 U/L 27 41(A) -    Imaging: No results found.  Speciality Comments: No specialty comments available.    Procedures:  No procedures performed Allergies: Oxycodone; Percocet [oxycodone-acetaminophen]; Soma [carisoprodol]; Ultram [tramadol hcl]; and Hydrocodone   Assessment / Plan:     Visit Diagnoses: Inflammatory arthritis  High risk medication use - Plaquenil   Calcium pyrophosphate deposition disease  Primary osteoarthritis of both hands  Complete tear of right rotator cuff  Primary osteoarthritis of left knee  History of total  right knee replacement  Chondromalacia of both patellae  Primary osteoarthritis of both feet  DDD (degenerative disc disease), lumbar  ANA positive - 1:160 titer  Other fatigue  Primary insomnia  History of miscarriage  History of hypertension  History of hyperlipidemia  History of esophagitis    Orders: No orders of the defined types were placed in this encounter.  No orders of the defined types were placed in this encounter.   Face-to-face time spent with patient was *** minutes. 50% of time was spent in counseling and coordination of care.  Follow-Up Instructions: No Follow-up on file.   Kathlyn Leachman, RT  Note - This record has been created using [employer].  Chart creation errors have been sought, but may not always  have been located. Such creation errors do not reflect on  the standard of medical care.

## 2016-10-11 ENCOUNTER — Ambulatory Visit (INDEPENDENT_AMBULATORY_CARE_PROVIDER_SITE_OTHER): Payer: Medicare Other | Admitting: Rheumatology

## 2016-10-11 ENCOUNTER — Ambulatory Visit: Payer: Medicare Other | Admitting: Rheumatology

## 2016-10-11 ENCOUNTER — Encounter: Payer: Self-pay | Admitting: Rheumatology

## 2016-10-11 VITALS — BP 118/60 | HR 79 | Ht 67.0 in | Wt 246.0 lb

## 2016-10-11 DIAGNOSIS — R768 Other specified abnormal immunological findings in serum: Secondary | ICD-10-CM | POA: Diagnosis not present

## 2016-10-11 DIAGNOSIS — M1120 Other chondrocalcinosis, unspecified site: Secondary | ICD-10-CM | POA: Diagnosis not present

## 2016-10-11 DIAGNOSIS — I10 Essential (primary) hypertension: Secondary | ICD-10-CM | POA: Diagnosis not present

## 2016-10-11 DIAGNOSIS — E7800 Pure hypercholesterolemia, unspecified: Secondary | ICD-10-CM

## 2016-10-11 DIAGNOSIS — M19041 Primary osteoarthritis, right hand: Secondary | ICD-10-CM | POA: Diagnosis not present

## 2016-10-11 DIAGNOSIS — Z79899 Other long term (current) drug therapy: Secondary | ICD-10-CM

## 2016-10-11 DIAGNOSIS — M75121 Complete rotator cuff tear or rupture of right shoulder, not specified as traumatic: Secondary | ICD-10-CM | POA: Diagnosis not present

## 2016-10-11 DIAGNOSIS — M1990 Unspecified osteoarthritis, unspecified site: Secondary | ICD-10-CM

## 2016-10-11 DIAGNOSIS — M19042 Primary osteoarthritis, left hand: Secondary | ICD-10-CM

## 2016-10-11 DIAGNOSIS — M19072 Primary osteoarthritis, left ankle and foot: Secondary | ICD-10-CM

## 2016-10-11 DIAGNOSIS — M19071 Primary osteoarthritis, right ankle and foot: Secondary | ICD-10-CM | POA: Diagnosis not present

## 2016-10-11 DIAGNOSIS — I480 Paroxysmal atrial fibrillation: Secondary | ICD-10-CM

## 2016-10-11 DIAGNOSIS — M5136 Other intervertebral disc degeneration, lumbar region: Secondary | ICD-10-CM | POA: Diagnosis not present

## 2016-10-11 DIAGNOSIS — R600 Localized edema: Secondary | ICD-10-CM

## 2016-10-11 DIAGNOSIS — Z96651 Presence of right artificial knee joint: Secondary | ICD-10-CM

## 2016-10-11 DIAGNOSIS — M51369 Other intervertebral disc degeneration, lumbar region without mention of lumbar back pain or lower extremity pain: Secondary | ICD-10-CM

## 2016-10-11 LAB — CBC WITH DIFFERENTIAL/PLATELET
BASOS ABS: 41 {cells}/uL (ref 0–200)
Basophils Relative: 0.7 %
EOS PCT: 1.6 %
Eosinophils Absolute: 93 cells/uL (ref 15–500)
HEMATOCRIT: 43.5 % (ref 35.0–45.0)
Hemoglobin: 14.2 g/dL (ref 11.7–15.5)
LYMPHS ABS: 3648 {cells}/uL (ref 850–3900)
MCH: 26.4 pg — ABNORMAL LOW (ref 27.0–33.0)
MCHC: 32.6 g/dL (ref 32.0–36.0)
MCV: 80.9 fL (ref 80.0–100.0)
MPV: 11.9 fL (ref 7.5–12.5)
Monocytes Relative: 9.2 %
NEUTROS PCT: 25.6 %
Neutro Abs: 1485 cells/uL — ABNORMAL LOW (ref 1500–7800)
Platelets: 170 10*3/uL (ref 140–400)
RBC: 5.38 10*6/uL — ABNORMAL HIGH (ref 3.80–5.10)
RDW: 13.5 % (ref 11.0–15.0)
Total Lymphocyte: 62.9 %
WBC mixed population: 534 cells/uL (ref 200–950)
WBC: 5.8 10*3/uL (ref 3.8–10.8)

## 2016-10-11 LAB — COMPLETE METABOLIC PANEL WITH GFR
AG RATIO: 1.5 (calc) (ref 1.0–2.5)
ALKALINE PHOSPHATASE (APISO): 85 U/L (ref 33–130)
ALT: 52 U/L — ABNORMAL HIGH (ref 6–29)
AST: 39 U/L — AB (ref 10–35)
Albumin: 4 g/dL (ref 3.6–5.1)
BILIRUBIN TOTAL: 0.5 mg/dL (ref 0.2–1.2)
BUN/Creatinine Ratio: 21 (calc) (ref 6–22)
BUN: 19 mg/dL (ref 7–25)
CHLORIDE: 103 mmol/L (ref 98–110)
CO2: 30 mmol/L (ref 20–32)
CREATININE: 0.91 mg/dL — AB (ref 0.60–0.88)
Calcium: 9.7 mg/dL (ref 8.6–10.4)
GFR, Est African American: 69 mL/min/{1.73_m2} (ref >=60)
GFR, Est Non African American: 60 mL/min/{1.73_m2} (ref >=60)
GLOBULIN: 2.6 g/dL (ref 1.9–3.7)
Glucose, Bld: 114 mg/dL — ABNORMAL HIGH (ref 65–99)
POTASSIUM: 4.1 mmol/L (ref 3.5–5.3)
SODIUM: 141 mmol/L (ref 135–146)
Total Protein: 6.6 g/dL (ref 6.1–8.1)

## 2016-10-11 NOTE — Progress Notes (Signed)
Office Visit Note  Patient: Tara Cox             Date of Birth: 1934-01-21           MRN: [id number]             PCP: [name], MD Referring: [name], MD Visit Date: 10/11/2016 Occupation: @GUAROCC @    Subjective:  Feet swelling and numbness   History of Present Illness: Tara Cox is a 82 y.o. female with history of inflammatory arthritis and pseudogout. She states she went to [place] by car. When she came back she had pain and swelling in her bilateral lower extremity. She states when she woke up this morning there was no swelling on her feet. As soon the swelling came back after being active. She states she's been also experiencing some numbness in her feet. She continues to have some discomfort in her hands due to underlying osteoarthritis.  Activities of Daily Living:  Patient reports morning stiffness for several hours    Patient Denies nocturnal pain.  Difficulty dressing/grooming: Denies Difficulty climbing stairs: Reports Difficulty getting out of chair: Denies Difficulty using hands for taps, buttons, cutlery, and/or writing: Denies   Review of Systems  Constitutional: Positive for fatigue. Negative for night sweats, weight gain, weight loss and weakness.  HENT: Negative for mouth sores, trouble swallowing, trouble swallowing, mouth dryness and nose dryness.   Eyes: Negative for pain, redness, visual disturbance and dryness.  Respiratory: Negative for cough, shortness of breath and difficulty breathing.   Cardiovascular: Positive for hypertension and swelling in legs/feet. Negative for chest pain, palpitations and irregular heartbeat.  Gastrointestinal: Negative for blood in stool, constipation and diarrhea.  Endocrine: Negative for increased urination.  Genitourinary: Negative for vaginal dryness.  Musculoskeletal: Positive for arthralgias, joint pain and morning stiffness. Negative for joint swelling, myalgias, muscle weakness, muscle  tenderness and myalgias.  Skin: Negative for color change, rash, hair loss, skin tightness, ulcers and sensitivity to sunlight.  Allergic/Immunologic: Negative for susceptible to infections.  Neurological: Negative for dizziness, memory loss and night sweats.  Hematological: Negative for swollen glands.  Psychiatric/Behavioral: Negative for depressed mood and sleep disturbance. The patient is not nervous/anxious.     PMFS History:  Patient Active Problem List   Diagnosis Date Noted  . Primary insomnia 09/27/2016  . ANA positive 09/27/2016  . Complete tear of right rotator cuff 05/11/2016  . High risk medication use 05/07/2016  . Uterine fibroid 12/08/2015  . History of miscarriage 12/08/2015  . Esophagitis 12/08/2015  . Diaphragmatic hernia 12/08/2015  . Calcium pyrophosphate deposition disease 12/08/2015  . Osteoarthritis of both hands 12/08/2015  . Osteoarthritis of both feet 12/08/2015  . History of total right knee replacement 12/08/2015  . Chondromalacia of both patellae 12/08/2015  . DDD (degenerative disc disease), lumbar 12/08/2015  . Essential hypertension 06/26/2013  . Paroxysmal atrial fibrillation ([facility]) 06/26/2013  . Hyperlipidemia 06/26/2013  . DJD (degenerative joint disease) of knee 01/26/2013  . Hemorrhoids 07/11/2010  . IBS (irritable bowel syndrome) 07/11/2010  . Fatigue/loss of sleep 07/11/2010  . Night sweats 07/11/2010  . Chills 07/11/2010  . Weight gain 07/11/2010  . Inflammatory arthritis 07/11/2010  . Incontinence of urine 07/11/2010  . Thrombosed external hemorrhoids 07/11/2010    Past Medical History:  Diagnosis Date  . A-fib ([facility])   . Arthritis   . 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 DDD (degenerative disc disease), lumbar 12/08/2015   S/P discectomy   . Diaphragmatic hernia  12/08/2015  . Edema of lower extremity 06/29/09   Lower Venous Exam- normal. No evidence of thrombus or thrombophlebitis.  . Esophagitis 12/08/2015  . Frequent  urination at night   . GERD (gastroesophageal reflux disease)   . Hemorrhoids   . History of calcium pyrophosphate deposition disease (CPPD) 12/08/2015  . History of miscarriage 12/08/2015   x3  . History of total right knee replacement 12/08/2015  . Hypertension 02/24/09   Echo-EF 62%; Myocardial Perfusion Study-Normal. No signifcant ischemia noted.  . IBS (irritable bowel syndrome)   . Migraine   . Osteoarthritis of both feet 12/08/2015  . Osteoarthritis of both hands 12/08/2015  . PONV (postoperative nausea and vomiting)   . Uterine fibroid 12/08/2015    Family History  Problem Relation Age of Onset  . Other Mother   . Varicose Veins Mother   . Emphysema Mother   . Heart disease Father        heart attack  . Diabetes Sister   . Other Sister        pacemaker  . Diabetes Sister   . Heart disease Sister    Past Surgical History:  Procedure Laterality Date  . ABDOMINAL HYSTERECTOMY  1974  . APPENDECTOMY    . BACK SURGERY  2001  . BUNIONECTOMY  1986  . CESAREAN SECTION  1971  . COLONOSCOPY    . COLONOSCOPY WITH PROPOFOL N/A 05/28/2014   Procedure: COLONOSCOPY WITH PROPOFOL;  Surgeon: Carol Ada, MD;  Location: [facility];  Service: Endoscopy;  Laterality: N/A;  . ESOPHAGOGASTRODUODENOSCOPY (EGD) WITH PROPOFOL N/A 05/28/2014   Procedure: ESOPHAGOGASTRODUODENOSCOPY (EGD) WITH PROPOFOL;  Surgeon: Carol Ada, MD;  Location: [facility];  Service: Endoscopy;  Laterality: N/A;  . EXPLORATORY LAPAROTOMY     open procedure  . EYE SURGERY Bilateral    cataract removal  . TOTAL KNEE ARTHROPLASTY Right 01/26/2013   DR Ronnie Derby  . TOTAL KNEE ARTHROPLASTY Right 01/26/2013   Procedure: TOTAL KNEE ARTHROPLASTY;  Surgeon: Vickey Huger, MD;  Location: [facility];  Service: Orthopedics;  Laterality: Right;  . TOTAL SHOULDER ARTHROPLASTY    . Force   Social History   Social History Narrative  . No narrative on file     Objective: Vital Signs: BP 118/60 (BP  Location: Left Arm, Patient Position: Sitting, Cuff Size: Normal)   Pulse 79   Ht 5\' 7"  (1.702 m)   Wt 246 lb (111.6 kg)   BMI 38.53 kg/m    Physical Exam  Constitutional: She is oriented to person, place, and time. She appears well-developed and well-nourished.  HENT:  Head: Normocephalic and atraumatic.  Eyes: Conjunctivae and EOM are normal.  Neck: Normal range of motion.  Cardiovascular: Normal rate, regular rhythm, normal heart sounds and intact distal pulses.   Pedal edema on bilateral lower extremities  Pulmonary/Chest: Effort normal and breath sounds normal.  Abdominal: Soft. Bowel sounds are normal.  Lymphadenopathy:    She has no cervical adenopathy.  Neurological: She is alert and oriented to person, place, and time.  Skin: Skin is warm and dry. Capillary refill takes less than 2 seconds.  Psychiatric: She has a normal mood and affect. Her behavior is normal.  Nursing note and vitals reviewed.    Musculoskeletal Exam: C-spine good range of motion. She has limited painful range of motion of her lumbar spine. Shoulder joints elbow joints wrist joints MCPs PIPs DIPs with good range of motion with no synovitis. She had some osteoarthritic changes in her  hands and feet with DIP PIP thickening. Right total knee replacement is doing well. No synovitis was noted in any of her joints. There was no tenderness on palpation of her ankle joints MTPs.  CDAI Exam: No CDAI exam completed.    Investigation: No additional findings.06/2016 eye exam normal 12/2015 CBC, CMP normal Imaging: No results found.  Speciality Comments: No specialty comments available.    Procedures:  No procedures performed Allergies: Oxycodone; Percocet [oxycodone-acetaminophen]; Soma [carisoprodol]; Ultram [tramadol hcl]; and Hydrocodone   Assessment / Plan:     Visit Diagnoses: Inflammatory arthritis: She has had episodic inflammation in her joints. She has no information on examination  today.  Pedal edema: She developed increased swelling in her bilateral feet after traveling to [place]. I've advised her to use compression socks. If her symptoms persist she should follow up with her PCP.  ANA positive  Calcium pyrophosphate deposition disease: She has episodic flares of CPPD for which she takes colchicine.  Primary osteoarthritis of both hands: She is not having much a stiffness currently  Primary osteoarthritis of both feet: She states that her feet symptoms are better since she's wearing proper fitting shoes.  History of total right knee replacement: She is some chronic discomfort.  High risk medication use: She is on Plaquenil. Her eye exam has been up-to-date. We will check her labs today.  DDD (degenerative disc disease), lumbar: Chronic pain. Weight loss diet and exercise was discussed.  Complete tear of right rotator cuff: She is minimal discomfort with range of motion.  Essential hypertension: Blood pressure is well controlled.  Pure hypercholesterolemia  Paroxysmal atrial fibrillation ([facility])    Orders: Orders Placed This Encounter  Procedures  . CBC with Differential/Platelet  . COMPLETE METABOLIC PANEL WITH GFR   No orders of the defined types were placed in this encounter.   Face-to-face time spent with patient was 30 minutes. Greater than 50% of time was spent in counseling and coordination of care.  Follow-Up Instructions: Return in about 6 months (around 04/11/2017) for inflammatory arthritis, CPPD, OA.   Bo Merino, MD  Note - This record has been created using Editor, commissioning.  Chart creation errors have been sought, but may not always  have been located. Such creation errors do not reflect on  the standard of medical care.

## 2016-10-12 NOTE — Progress Notes (Signed)
Labs are stable. Mild elevation of LFTs. Advised to avoid all NSAIDs and alcohol.

## 2016-10-17 ENCOUNTER — Telehealth: Payer: Self-pay | Admitting: Radiology

## 2016-10-17 NOTE — Telephone Encounter (Signed)
She does not use NSAIDs with her Coumadin I have called patient to advise. She is on Lipitor I sent to PCP

## 2016-10-17 NOTE — Telephone Encounter (Signed)
-----   Message from Bo Merino, MD sent at 10/12/2016 12:46 PM EDT ----- Labs are stable. Mild elevation of LFTs. Advised to avoid all NSAIDs and alcohol.

## 2016-11-06 ENCOUNTER — Other Ambulatory Visit: Payer: Self-pay | Admitting: *Deleted

## 2016-11-06 MED ORDER — COLCHICINE 0.6 MG PO TABS: 0.6000 mg | ORAL_TABLET | Freq: Every day | ORAL | 1 refills | Status: DC

## 2016-11-06 NOTE — Telephone Encounter (Signed)
Refill request received via fax  Last Visit: 10/11/16 Next Visit: 04/11/17 Labs: 10/11/16 Labs are stable. Mild elevation of LFTs.   Okay to refill per Dr. Estanislado Pandy

## 2016-11-13 ENCOUNTER — Other Ambulatory Visit: Payer: Self-pay | Admitting: Rheumatology

## 2016-11-15 ENCOUNTER — Telehealth: Payer: Self-pay

## 2016-11-15 MED ORDER — COLCHICINE 0.6 MG PO TABS: 0.6000 mg | ORAL_TABLET | Freq: Every day | ORAL | 1 refills | Status: DC

## 2016-11-15 NOTE — Addendum Note (Signed)
Addended by: Carole Binning on: 11/15/2016 03:31 PM   Modules accepted: Orders

## 2016-11-15 NOTE — Telephone Encounter (Signed)
Patient called stating that Rx for Colchicine was sent to the wrong pharmacy.  Would like for Colchicine Rx to be sent to [facility].  CB# is [phone].  Please advise.

## 2016-11-15 NOTE — Telephone Encounter (Signed)
Patient's prescription for Colchicine was sent to the local pharmacy instead of mail order pharmacy. Resent prescription to mail order pharmacy.

## 2016-11-19 ENCOUNTER — Ambulatory Visit: Payer: Medicare Other | Admitting: Rheumatology

## 2017-01-07 ENCOUNTER — Other Ambulatory Visit: Payer: Self-pay | Admitting: Rheumatology

## 2017-01-07 NOTE — Telephone Encounter (Signed)
Last Visit: 10/11/16 Next Visit: 04/11/17 Labs: 10/11/16 Labs are stable. Mild elevation of LFTs.  PLQ EYE Exam: 06/29/16 WNL   Okay to refill per Dr. Estanislado Pandy

## 2017-01-08 DIAGNOSIS — Z9221 Personal history of antineoplastic chemotherapy: Secondary | ICD-10-CM

## 2017-01-08 DIAGNOSIS — Z923 Personal history of irradiation: Secondary | ICD-10-CM

## 2017-01-08 HISTORY — DX: Personal history of irradiation: Z92.3

## 2017-01-08 HISTORY — DX: Personal history of antineoplastic chemotherapy: Z92.21

## 2017-02-25 ENCOUNTER — Other Ambulatory Visit: Payer: Self-pay | Admitting: Rheumatology

## 2017-02-25 NOTE — Telephone Encounter (Signed)
Last Visit: 10/11/16 Next Visit: 04/11/17 Labs: 10/11/16 Labs are stable. Mild elevation of LFTs.  PLQ Eye Exam: 06/2016 WNL  Okay to refill per Dr. Estanislado Pandy

## 2017-03-20 ENCOUNTER — Other Ambulatory Visit: Payer: Self-pay | Admitting: Physician Assistant

## 2017-03-20 DIAGNOSIS — N6453 Retraction of nipple: Secondary | ICD-10-CM

## 2017-03-20 DIAGNOSIS — N6310 Unspecified lump in the right breast, unspecified quadrant: Secondary | ICD-10-CM

## 2017-03-26 ENCOUNTER — Ambulatory Visit
Admission: RE | Admit: 2017-03-26 | Discharge: 2017-03-26 | Disposition: A | Discharge status: Home / Self Care | Payer: Medicare Other | Source: Ambulatory Visit | Attending: Physician Assistant | Admitting: Physician Assistant

## 2017-03-26 ENCOUNTER — Other Ambulatory Visit: Payer: Self-pay | Admitting: Physician Assistant

## 2017-03-26 DIAGNOSIS — N6310 Unspecified lump in the right breast, unspecified quadrant: Secondary | ICD-10-CM

## 2017-03-26 DIAGNOSIS — R599 Enlarged lymph nodes, unspecified: Secondary | ICD-10-CM

## 2017-03-26 DIAGNOSIS — N6453 Retraction of nipple: Secondary | ICD-10-CM

## 2017-03-29 ENCOUNTER — Ambulatory Visit
Admission: RE | Admit: 2017-03-29 | Discharge: 2017-03-29 | Disposition: A | Discharge status: Home / Self Care | Payer: Medicare Other | Source: Ambulatory Visit | Attending: Physician Assistant | Admitting: Physician Assistant

## 2017-03-29 ENCOUNTER — Other Ambulatory Visit: Payer: Self-pay | Admitting: Physician Assistant

## 2017-03-29 DIAGNOSIS — R599 Enlarged lymph nodes, unspecified: Secondary | ICD-10-CM

## 2017-03-29 DIAGNOSIS — N6310 Unspecified lump in the right breast, unspecified quadrant: Secondary | ICD-10-CM

## 2017-03-29 NOTE — Progress Notes (Deleted)
Office Visit Note  Patient: Tara Cox             Date of Birth: 1934-05-10           MRN: [id number]             PCP: [name], MD Referring: [name], MD Visit Date: 04/11/2017 Occupation: _0 @    Subjective:  No chief complaint on file.   History of Present Illness: Tara Cox is a 83 y.o. female ***   Activities of Daily Living:  Patient reports morning stiffness for *** {minute/hour:19697}.   Patient {ACTIONS;DENIES/REPORTS:21021675::"Denies"} nocturnal pain.  Difficulty dressing/grooming: {ACTIONS;DENIES/REPORTS:21021675::"Denies"} Difficulty climbing stairs: {ACTIONS;DENIES/REPORTS:21021675::"Denies"} Difficulty getting out of chair: {ACTIONS;DENIES/REPORTS:21021675::"Denies"} Difficulty using hands for taps, buttons, cutlery, and/or writing: {ACTIONS;DENIES/REPORTS:21021675::"Denies"}   No Rheumatology ROS completed.   PMFS History:  Patient Active Problem List   Diagnosis Date Noted  . Primary insomnia 09/27/2016  . ANA positive 09/27/2016  . Complete tear of right rotator cuff 05/11/2016  . High risk medication use 05/07/2016  . Uterine fibroid 12/08/2015  . History of miscarriage 12/08/2015  . Esophagitis 12/08/2015  . Diaphragmatic hernia 12/08/2015  . Calcium pyrophosphate deposition disease 12/08/2015  . Osteoarthritis of both hands 12/08/2015  . Osteoarthritis of both feet 12/08/2015  . History of total right knee replacement 12/08/2015  . Chondromalacia of both patellae 12/08/2015  . DDD (degenerative disc disease), lumbar 12/08/2015  . Essential hypertension 06/26/2013  . Paroxysmal atrial fibrillation ([place]) 06/26/2013  . Hyperlipidemia 06/26/2013  . DJD (degenerative joint disease) of knee 01/26/2013  . Hemorrhoids 07/11/2010  . IBS (irritable bowel syndrome) 07/11/2010  . Fatigue/loss of sleep 07/11/2010  . Night sweats 07/11/2010  . Chills 07/11/2010  . Weight gain 07/11/2010  . Inflammatory arthritis 07/11/2010    . Incontinence of urine 07/11/2010  . Thrombosed external hemorrhoids 07/11/2010    Past Medical History:  Diagnosis Date  . A-fib ([place])   . Arthritis   . 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 DDD (degenerative disc disease), lumbar 12/08/2015   S/P discectomy   . Diaphragmatic hernia 12/08/2015  . Edema of lower extremity 06/29/09   Lower Venous Exam- normal. No evidence of thrombus or thrombophlebitis.  . Esophagitis 12/08/2015  . Frequent urination at night   . GERD (gastroesophageal reflux disease)   . Hemorrhoids   . History of calcium pyrophosphate deposition disease (CPPD) 12/08/2015  . History of miscarriage 12/08/2015   x3  . History of total right knee replacement 12/08/2015  . Hypertension 02/24/09   Echo-EF 62%; Myocardial Perfusion Study-Normal. No signifcant ischemia noted.  . IBS (irritable bowel syndrome)   . Migraine   . Osteoarthritis of both feet 12/08/2015  . Osteoarthritis of both hands 12/08/2015  . PONV (postoperative nausea and vomiting)   . Uterine fibroid 12/08/2015    Family History  Problem Relation Age of Onset  . Other Mother   . Varicose Veins Mother   . Emphysema Mother   . Heart disease Father        heart attack  . Diabetes Sister   . Other Sister        pacemaker  . Diabetes Sister   . Heart disease Sister    Past Surgical History:  Procedure Laterality Date  . ABDOMINAL HYSTERECTOMY  1974  . APPENDECTOMY    . BACK SURGERY  2001  . BUNIONECTOMY  1986  . CESAREAN SECTION  1971  . COLONOSCOPY    . COLONOSCOPY WITH PROPOFOL N/A 05/28/2014  Procedure: COLONOSCOPY WITH PROPOFOL;  Surgeon: Carol Ada, MD;  Location: [facility];  Service: Endoscopy;  Laterality: N/A;  . ESOPHAGOGASTRODUODENOSCOPY (EGD) WITH PROPOFOL N/A 05/28/2014   Procedure: ESOPHAGOGASTRODUODENOSCOPY (EGD) WITH PROPOFOL;  Surgeon: Carol Ada, MD;  Location: [facility];  Service: Endoscopy;  Laterality: N/A;  . EXPLORATORY LAPAROTOMY     open procedure  .  EYE SURGERY Bilateral    cataract removal  . TOTAL KNEE ARTHROPLASTY Right 01/26/2013   DR Ronnie Derby  . TOTAL KNEE ARTHROPLASTY Right 01/26/2013   Procedure: TOTAL KNEE ARTHROPLASTY;  Surgeon: Vickey Huger, MD;  Location: [facility];  Service: Orthopedics;  Laterality: Right;  . TOTAL SHOULDER ARTHROPLASTY    . [place]   Social History   Social History Narrative  . Not on file     Objective: Vital Signs: There were no vitals taken for this visit.   Physical Exam   Musculoskeletal Exam: ***  CDAI Exam: No CDAI exam completed.    Investigation: No additional findings.PLQ eye exam: 06/2016 CBC Latest Ref Rng & Units 10/11/2016 12/12/2015 09/22/2015  WBC 3.8 - 10.8 Thousand/uL 5.8 5.8 5.2  Hemoglobin 11.7 - 15.5 g/dL 14.2 14.8 13.9  Hematocrit 35.0 - 45.0 % 43.5 46.1(H) 34(A)  Platelets 140 - 400 Thousand/uL 170 183 179   CMP Latest Ref Rng & Units 10/11/2016 12/12/2015 09/22/2015  Glucose 65 - 99 mg/dL 114(H) 94 -  BUN 7 - 25 mg/dL _0 Creatinine 0.60 - 0.88 mg/dL 0.91(H) 0.85 0.9  Sodium 135 - 146 mmol/L 141 140 142  Potassium 3.5 - 5.3 mmol/L 4.1 4.2 4.2  Chloride 98 - 110 mmol/L 103 101 -  CO2 20 - 32 mmol/L 30 28 -  Calcium 8.6 - 10.4 mg/dL 9.7 10.0 -  Total Protein 6.1 - 8.1 g/dL 6.6 7.1 -  Total Bilirubin 0.2 - 1.2 mg/dL 0.5 0.8 -  Alkaline Phos 33 - 130 U/L - 87 78  AST 10 - 35 U/L 39(H) 27 28  ALT 6 - 29 U/L 52(H) 27 41(A)    Imaging: US Breast Ltd Uni Right Inc Axilla  Result Date: 03/26/2017 CLINICAL DATA:  Delayed follow-up for probably benign right breast calcifications. The patient states she has noticed right nipple inversion over course of the past several months. EXAM: DIGITAL DIAGNOSTIC BILATERAL MAMMOGRAM WITH CAD AND TOMO RIGHT BREAST ULTRASOUND COMPARISON:  Previous exam(s). ACR Breast Density Category b: There are scattered areas of fibroglandular density. FINDINGS: There is an irregular spiculated mass in the retroareolar right breast  with associated calcifications with mass and calcifications together measuring approximately 3.3 cm. There is associated retraction of the right nipple. No suspicious masses or calcifications are seen in the left breast. Mammographic images were processed with CAD. Physical examination of the right breast reveals a firm fixed right retroareolar mass with associated flattening of the nipple. This is tender to palpation. Targeted ultrasound of the retroareolar right breast was performed demonstrating an irregular hypoechoic mass in the immediate retroareolar right breast measuring approximately 2.3 x 1.5 x 2.8 cm. This corresponds well with the mass seen in the right breast at mammography. There are 2 lymph nodes in the right axilla with borderline thickened cortices, 1 of which measures 1 x 0.9 x 0.6 cm with a cortex thickness of 0.4 mm. IMPRESSION: 1. Highly suspicious right retroareolar mass with associated calcifications, together the mass and calcifications measure up to 3.3 cm mammographically. 2.  Borderline right axillary lymph node. RECOMMENDATION: 1. Ultrasound-guided  biopsy of the retroareolar right breast mass is recommended. 2. Ultrasound-guided biopsy of 1 of the lymph nodes in the low right axilla containing a borderline thickened cortex. I have discussed the findings and recommendations with the patient. Results were also provided in writing at the conclusion of the visit. If applicable, a reminder letter will be sent to the patient regarding the next appointment. BI-RADS CATEGORY  5: Highly suggestive of malignancy. Electronically Signed   By: Everlean Alstrom M.D.   On: 03/26/2017 09:48   Mm Diag Breast Tomo Bilateral  Result Date: 03/26/2017 CLINICAL DATA:  Delayed follow-up for probably benign right breast calcifications. The patient states she has noticed right nipple inversion over course of the past several months. EXAM: DIGITAL DIAGNOSTIC BILATERAL MAMMOGRAM WITH CAD AND TOMO RIGHT BREAST  ULTRASOUND COMPARISON:  Previous exam(s). ACR Breast Density Category b: There are scattered areas of fibroglandular density. FINDINGS: There is an irregular spiculated mass in the retroareolar right breast with associated calcifications with mass and calcifications together measuring approximately 3.3 cm. There is associated retraction of the right nipple. No suspicious masses or calcifications are seen in the left breast. Mammographic images were processed with CAD. Physical examination of the right breast reveals a firm fixed right retroareolar mass with associated flattening of the nipple. This is tender to palpation. Targeted ultrasound of the retroareolar right breast was performed demonstrating an irregular hypoechoic mass in the immediate retroareolar right breast measuring approximately 2.3 x 1.5 x 2.8 cm. This corresponds well with the mass seen in the right breast at mammography. There are 2 lymph nodes in the right axilla with borderline thickened cortices, 1 of which measures 1 x 0.9 x 0.6 cm with a cortex thickness of 0.4 mm. IMPRESSION: 1. Highly suspicious right retroareolar mass with associated calcifications, together the mass and calcifications measure up to 3.3 cm mammographically. 2.  Borderline right axillary lymph node. RECOMMENDATION: 1. Ultrasound-guided biopsy of the retroareolar right breast mass is recommended. 2. Ultrasound-guided biopsy of 1 of the lymph nodes in the low right axilla containing a borderline thickened cortex. I have discussed the findings and recommendations with the patient. Results were also provided in writing at the conclusion of the visit. If applicable, a reminder letter will be sent to the patient regarding the next appointment. BI-RADS CATEGORY  5: Highly suggestive of malignancy. Electronically Signed   By: Everlean Alstrom M.D.   On: 03/26/2017 09:48   [place] Axillary Node Core Biopsy Right  Result Date: 03/29/2017 CLINICAL DATA:  Right axillary lymph node with  cortical thickening was recommended for biopsy given suspicious findings in the retroareolar right breast. EXAM: [place] AXILLARY NODE CORE BIOPSY RIGHT COMPARISON:  Previous exam(s). FINDINGS: I met with the patient and we discussed the procedure of ultrasound-guided biopsy, including benefits and alternatives. We discussed the high likelihood of a successful procedure. We discussed the risks of the procedure, including infection, bleeding, tissue injury, clip migration, and inadequate sampling. Informed written consent was given. The usual time-out protocol was performed immediately prior to the procedure. Using sterile technique and 1% Lidocaine as local anesthetic, under direct ultrasound visualization, a 14 gauge spring-loaded device was used to perform biopsy of a right axillary lymph node with cortical thickening using a lateral approach. At the conclusion of the procedure a HydroMARK tissue marker clip was deployed into the biopsy cavity. Follow up 2 view mammogram was performed and dictated separately. IMPRESSION: Ultrasound guided biopsy of right axillary lymph node. No apparent complications. Electronically Signed  By: Curlene Dolphin M.D.   On: 03/29/2017 16:31   Mm Clip Placement Right  Result Date: 03/29/2017 CLINICAL DATA:  Ultrasound-guided biopsies were performed of a suspicious retroareolar right breast mass and a right axillary lymph node. EXAM: DIAGNOSTIC RIGHT MAMMOGRAM POST ULTRASOUND BIOPSY COMPARISON:  Previous exam(s). FINDINGS: Mammographic images were obtained following ultrasound guided biopsy of a retroareolar right breast mass and a right axillary lymph node. A heart shaped biopsy clip is present in the retroareolar right breast mass. A HydroMARK biopsy clip is present within a right axillary lymph node. IMPRESSION: Satisfactory position of heart shaped and HydroMARK biopsy clips. Final Assessment: Post Procedure Mammograms for Marker Placement Electronically Signed   By: Curlene Dolphin  M.D.   On: 03/29/2017 16:28   [place] Rt Breast Bx W Loc Dev 1st Lesion Img Bx Spec US Guide  Result Date: 03/29/2017 CLINICAL DATA:  Suspicious palpable retroareolar right breast mass. EXAM: ULTRASOUND GUIDED RIGHT BREAST CORE NEEDLE BIOPSY COMPARISON:  Previous exam(s). FINDINGS: I met with the patient and we discussed the procedure of ultrasound-guided biopsy, including benefits and alternatives. We discussed the high likelihood of a successful procedure. We discussed the risks of the procedure, including infection, bleeding, tissue injury, clip migration, and inadequate sampling. Informed written consent was given. The usual time-out protocol was performed immediately prior to the procedure. Lesion quadrant: The mass involves all 4 quadrants of the retroareolar right breast. Using sterile technique and 1% Lidocaine as local anesthetic, under direct ultrasound visualization, a 12 gauge spring-loaded device was used to perform biopsy of a hypoechoic central retroareolar right breast mass using a lateral approach. At the conclusion of the procedure a heart shaped tissue marker clip was deployed into the biopsy cavity. Follow up 2 view mammogram was performed and dictated separately. IMPRESSION: Ultrasound guided biopsy of the right breast. No apparent complications. Electronically Signed   By: Curlene Dolphin M.D.   On: 03/29/2017 16:30    Speciality Comments: No specialty comments available.    Procedures:  No procedures performed Allergies: Oxycodone; Percocet [oxycodone-acetaminophen]; Soma [carisoprodol]; Ultram [tramadol hcl]; and Hydrocodone   Assessment / Plan:     Visit Diagnoses: No diagnosis found.    Orders: No orders of the defined types were placed in this encounter.  No orders of the defined types were placed in this encounter.   Face-to-face time spent with patient was *** minutes. 50% of time was spent in counseling and coordination of care.  Follow-Up Instructions: No follow-ups  on file.   Earnestine Mealing, CMA  Note - This record has been created using Editor, commissioning.  Chart creation errors have been sought, but may not always  have been located. Such creation errors do not reflect on  the standard of medical care.

## 2017-04-01 ENCOUNTER — Other Ambulatory Visit: Payer: Self-pay | Admitting: Rheumatology

## 2017-04-01 NOTE — Telephone Encounter (Signed)
Last Visit: 10/11/16 Next Visit: 04/11/17 Labs: 10/4/18Labs are stable. Mild elevation of LFTs.   Okay to refill per Dr. Estanislado Pandy

## 2017-04-02 ENCOUNTER — Encounter: Payer: Self-pay | Admitting: *Deleted

## 2017-04-02 ENCOUNTER — Telehealth: Payer: Self-pay | Admitting: Oncology

## 2017-04-02 DIAGNOSIS — C50111 Malignant neoplasm of central portion of right female breast: Secondary | ICD-10-CM | POA: Insufficient documentation

## 2017-04-02 DIAGNOSIS — Z170 Estrogen receptor positive status [ER+]: Principal | ICD-10-CM

## 2017-04-02 NOTE — Telephone Encounter (Signed)
Spoke to patient to confirm morning [facility] appointment for 04/10/17, packet will be mailed to patient

## 2017-04-05 ENCOUNTER — Encounter: Payer: Self-pay | Admitting: *Deleted

## 2017-04-08 NOTE — Progress Notes (Signed)
[facility]  Telephone:([phone]) [phone] Fax:(336) [phone]     ID: Tara Cox DOB: 30-Apr-1934  MR#: [id number]  UXN#:[id number]  Patient Care Team: [name], MD as PCP - General (Family Medicine) Raedyn Wenke, Virgie Dad, MD as Consulting Physician (Oncology) Erroll Luna, MD as Consulting Physician (General Surgery) Kyung Rudd, MD as Consulting Physician (Radiation Oncology) Vickey Huger, MD as Consulting Physician (Orthopedic Surgery) Bo Merino, MD as Consulting Physician (Rheumatology) Lorretta Harp, MD as Consulting Physician (Cardiology) OTHER MD:   CHIEF COMPLAINT: Triple positive breast cancer  CURRENT TREATMENT: Awaiting definitive surgery   HISTORY OF CURRENT ILLNESS: Tara Cox had mammography at the breast center October 11, 2011 showing calcifications in the upper right breast.  Six-month follow-up was suggested, but  the patient did not follow-up.  More recently she noticed nipple inversion on the right breast and followed this without reporting it over the last several months. She did undergo bilateral diagnostic mammography with tomography and right breast ultrasonography at [facility] on 03/26/2017 showing: breast density category B. There is a highly suspicious right retroareolar mass  measuring approximately 2.3 x 1.5 x 2.8 cm  with associated calcifications, together the mass and calcifications measure up to 3.3 cm mammographically. There are 2 lymph nodes in the right axilla with borderline thickened cortices, 1 of which measures 1 x 0.9 x 0.6 cm with a cortex thickness of 0.4 mm.  Accordingly on 03/29/2017 she proceeded to biopsy of the right breast and one axillary lymph node.. The pathology from this procedure showed ([id number]): Invasive lobular carcinoma,grade II, E-cadherin negative. The right axillary lymph node was negative for carcinoma. Prognostic indicators significant for: estrogen receptor, 100% positive and  progesterone receptor, 100% positive, both with strong staining intensity. Proliferation marker Ki67 at 25%. HER2 amplified with ratios  HER2/CEP17 signals 2.22 and average HER2 copies per cell 3.00  The patient's subsequent history is as detailed below.  INTERVAL HISTORY: Tara Cox was evaluated in the multidisciplinary breast cancer clinic on 04/10/2017 accompanied by daughter Tara Cox on there phone, and her husband Tara Cox and Curator Tara Cox.  The patient's case was also presented at the multidisciplinary breast cancer conference on the same day. At that time a preliminary plan was proposed: MRI, to decide whether central lumpectomy with sentinel lymph node sampling or mastectomy with sentinel lymph node sampling, port placement for anti-HER-2 immunotherapy, adjuvant radiation, antiestrogens   REVIEW OF SYSTEMS: She felt nipple changes since a few month after her shoulder surgery. She walked into the bed post and had some breast tenderness and soreness. She noticed some lumps within the breast after this happened and decided to bring this to medical attention. The patient denies unusual headaches, visual changes, nausea, vomiting, stiff neck, dizziness, or gait imbalance. There has been no cough, phlegm production, or pleurisy, no chest pain or pressure, and no change in bowel or bladder habits. The patient denies fever, rash, bleeding, unexplained fatigue or unexplained weight loss. A detailed review of systems was otherwise entirely negative.   PAST MEDICAL HISTORY: Past Medical History:  Diagnosis Date  . A-fib ([place])   . Arthritis   . 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 DDD (degenerative disc disease), lumbar 12/08/2015   S/P discectomy   . Diaphragmatic hernia 12/08/2015  . Edema of lower extremity 06/29/09   Lower Venous Exam- normal. No evidence of thrombus or thrombophlebitis.  . Esophagitis 12/08/2015  . Frequent urination at night   . GERD (gastroesophageal reflux disease)   .  Hemorrhoids   . History of calcium pyrophosphate deposition disease (CPPD) 12/08/2015  . History of miscarriage 12/08/2015   x3  . History of total right knee replacement 12/08/2015  . Hypertension 02/24/09   Echo-EF 62%; Myocardial Perfusion Study-Normal. No signifcant ischemia noted.  . IBS (irritable bowel syndrome)   . Migraine   . Osteoarthritis of both feet 12/08/2015  . Osteoarthritis of both hands 12/08/2015  . PONV (postoperative nausea and vomiting)   . Uterine fibroid 12/08/2015    PAST SURGICAL HISTORY: Past Surgical History:  Procedure Laterality Date  . ABDOMINAL HYSTERECTOMY  1974  . APPENDECTOMY    . BACK SURGERY  2001  . BUNIONECTOMY  1986  . CESAREAN SECTION  1971  . COLONOSCOPY    . COLONOSCOPY WITH PROPOFOL N/A 05/28/2014   Procedure: COLONOSCOPY WITH PROPOFOL;  Surgeon: Carol Ada, MD;  Location: [facility];  Service: Endoscopy;  Laterality: N/A;  . ESOPHAGOGASTRODUODENOSCOPY (EGD) WITH PROPOFOL N/A 05/28/2014   Procedure: ESOPHAGOGASTRODUODENOSCOPY (EGD) WITH PROPOFOL;  Surgeon: Carol Ada, MD;  Location: [facility];  Service: Endoscopy;  Laterality: N/A;  . EXPLORATORY LAPAROTOMY     open procedure  . EYE SURGERY Bilateral    cataract removal  . TOTAL KNEE ARTHROPLASTY Right 01/26/2013   DR Ronnie Derby  . TOTAL KNEE ARTHROPLASTY Right 01/26/2013   Procedure: TOTAL KNEE ARTHROPLASTY;  Surgeon: Vickey Huger, MD;  Location: [facility];  Service: Orthopedics;  Laterality: Right;  . TOTAL SHOULDER ARTHROPLASTY    . UTERINE FIBROID SURGERY  1967    FAMILY HISTORY Family History  Problem Relation Age of Onset  . Other Mother   . Varicose Veins Mother   . Emphysema Mother   . Heart disease Father        heart attack  . Diabetes Sister   . Other Sister        pacemaker  . Diabetes Sister   . Heart disease Sister   The patient's father died in 27 (around age 68) in [place] due to sudden death. The patient's mother died around age 51 due to emphysema. The  patient was born in [place], [place]. The patient has 2 brothers and 3 sisters. One sister had a lumpectomy in 1991, but this was not malignant. Another sister died of pancreatic cancer. The patient denies a family history of breast or ovarian cancer.    GYNECOLOGIC HISTORY:  No LMP recorded. Patient has had a hysterectomy. Menarche: 83 years old Age at first live birth: 83 years old She is GXP1.  Her LMP was at age 53 when she underwent hysterectomy, without salpingo-oophorectomy. She was on Premarin for more than 20 years.  She never used oral contraceptives.   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 Her husband, Tara Cox, used to be in banking, and he is currently retired. The patient's biological daughter, Tara Cox, is a Health and safety inspector at a contact center in [place]. The patient's adopted daughter, Tara Cox, is an Social research officer, government in [place]. The patient's adopted son, Tara Cox, is unemployed in [place]. The patient has 8 grandchildren, one of whom belongs to [place] and works as a Pharmacist, community.  The patient belongs to [place].      ADVANCED DIRECTIVES:    HEALTH MAINTENANCE: Social History   Tobacco Use  . Smoking status: Former Smoker    Packs/day: 1.00    Years: 20.00    Pack years: 20.00    Types: Cigarettes  Last attempt to quit: 02/19/1990    Years since quitting: 27.1  . Smokeless tobacco: Never Used  . Tobacco comment: ' i QUIT SMOKING MANY YEARS AGO "  Substance Use Topics  . Alcohol use: No  . Drug use: No     Colonoscopy: Dr. Benson Norway 2016  PAP:  Bone density:   Allergies  Allergen Reactions  . Oxycodone Nausea And Vomiting    Full body weakness   . Percocet [Oxycodone-Acetaminophen] Itching    Pt says she can take tylenol   . Soma [Carisoprodol] Nausea And Vomiting  . Ultram [Tramadol Hcl] Nausea And Vomiting  . Hydrocodone Other (See Comments)    Reports they make  her feel sick/drowsy    Current Outpatient Medications  Medication Sig Dispense Refill  . acetaminophen (TYLENOL) 325 MG tablet Take 2 tablets by mouth at bedtime as needed.    Marland Kitchen aspirin EC 81 MG tablet Take 81 mg by mouth daily.    Marland Kitchen atorvastatin (LIPITOR) 20 MG tablet Take by mouth daily at 6 PM.     . Calcium Carb-Cholecalciferol 600-800 MG-UNIT TABS Take 1 tablet by mouth 2 (two) times daily.     . cholecalciferol (VITAMIN D) 1000 UNITS tablet Take 3,000 Units by mouth daily.     . colchicine 0.6 MG tablet TAKE 1 TABLET DAILY BY  MOUTH. 90 tablet 1  . diphenhydrAMINE (BENADRYL) 25 mg capsule Take 25 mg by mouth every 6 (six) hours as needed for allergies.    . furosemide (LASIX) 20 MG tablet Take 20 mg by mouth daily.      Marland Kitchen gabapentin (NEURONTIN) 300 MG capsule Take 300 mg by mouth 2 (two) times daily.     . hydrocortisone (ANUSOL-HC) 2.5 % rectal cream Place 1 application rectally 2 (two) times daily as needed for hemorrhoids or itching.    . hydroxychloroquine (PLAQUENIL) 200 MG tablet TAKE 1 TABLET BY MOUTH TWO  TIMES DAILY 180 tablet 0  . losartan-hydrochlorothiazide (HYZAAR) 100-25 MG per tablet Take 1 tablet by mouth every morning.     . magnesium oxide (MAG-OX) 400 MG tablet Take 400 mg by mouth daily.     . metoprolol tartrate (LOPRESSOR) 25 MG tablet Take 12.5 mg by mouth 2 (two) times daily. Patient takes 1/2 in am and 1/2 in pm    . Multiple Vitamin (MULTIVITAMIN) tablet Take 1 tablet by mouth daily.    Marland Kitchen omeprazole (PRILOSEC) 40 MG capsule Take 1 capsule by mouth daily as needed.  12  . OVER THE COUNTER MEDICATION Take 1 tablet by mouth daily as needed (Pain). Mygrafew (health food store)    . potassium chloride SA (K-DUR,KLOR-CON) 20 MEQ tablet Take 10 mEq by mouth daily.    Marland Kitchen triamcinolone cream (KENALOG) 0.1 % Apply 1 application topically 2 (two) times daily as needed (Vaginal Itching).    . vitamin C (ASCORBIC ACID) 500 MG tablet Take 500 mg by mouth daily.    Marland Kitchen  warfarin (COUMADIN) 5 MG tablet Take 0.5-1 tablets (2.5-5 mg total) by mouth daily. Take 5 mg tablet daily or as directed by pharmacist from [facility] (Patient taking differently: Take 2.5-5 mg by mouth daily. Takes 35m every day except for Thursday takes 2.542m 40 tablet 1  . dextromethorphan (DELSYM) 30 MG/5ML liquid Take 30 mg by mouth daily as needed for cough.     No current facility-administered medications for this visit.     OBJECTIVE: Older African-American woman in no acute distress  Vitals:  04/10/17 0833  BP: (!) 154/68  Pulse: 99  Resp: 17  Temp: 98 F (36.7 C)  SpO2: 98%     Body mass index is 37.65 kg/m.   Wt Readings from Last 3 Encounters:  04/10/17 240 lb 6.4 oz (109 kg)  10/11/16 246 lb (111.6 kg)  08/29/16 242 lb (109.8 kg)      ECOG FS:0 - Asymptomatic  Ocular: Sclerae unicteric, pupils round and equal Ear-nose-throat: Oropharynx clear and moist Lymphatic: No cervical or supraclavicular adenopathy Lungs no rales or rhonchi Heart regular rate and rhythm Abd soft, nontender, positive bowel sounds MSK no focal spinal tenderness, no joint edema Neuro: non-focal, well-oriented, appropriate affect Breasts: The right breast is status post recent biopsy.  There is also induration and some mild retraction of the right nipple.  I do not see any erythema.  The right axilla is benign.  The left breast and left axilla are benign   LAB RESULTS:  CMP     Component Value Date/Time   NA 141 10/11/2016 1136   NA 142 09/22/2015   K 4.1 10/11/2016 1136   CL 103 10/11/2016 1136   CO2 30 10/11/2016 1136   GLUCOSE 114 (H) 10/11/2016 1136   BUN 19 10/11/2016 1136   BUN 19 09/22/2015   CREATININE 0.91 (H) 10/11/2016 1136   CALCIUM 9.7 10/11/2016 1136   PROT 6.6 10/11/2016 1136   ALBUMIN 4.4 12/12/2015 0905   AST 39 (H) 10/11/2016 1136   ALT 52 (H) 10/11/2016 1136   ALKPHOS 87 12/12/2015 0905   BILITOT 0.5 10/11/2016 1136   GFRNONAA 60 10/11/2016 1136     GFRAA 69 10/11/2016 1136    No results found for: TOTALPROTELP, ALBUMINELP, A1GS, A2GS, BETS, BETA2SER, GAMS, MSPIKE, SPEI  No results found for: KPAFRELGTCHN, LAMBDASER, KAPLAMBRATIO  Lab Results  Component Value Date   WBC 5.8 10/11/2016   NEUTROABS 1,485 (L) 10/11/2016   HGB 14.2 10/11/2016   HCT 43.5 10/11/2016   MCV 80.9 10/11/2016   PLT 170 10/11/2016    '@LASTCHEMISTRY' @  No results found for: LABCA2  No components found for: MBTDHR416  No results for input(s): INR in the last 168 hours.  No results found for: LABCA2  No results found for: LAG536  No results found for: IWO032  No results found for: ZYY482  No results found for: CA2729  No components found for: HGQUANT  No results found for: CEA1 / No results found for: CEA1   No results found for: AFPTUMOR  No results found for: CHROMOGRNA  No results found for: PSA1  No visits with results within 3 Day(s) from this visit.  Latest known visit with results is:  Office Visit on 10/11/2016  Component Date Value Ref Range Status  . WBC 10/11/2016 5.8  3.8 - 10.8 Thousand/uL Final  . RBC 10/11/2016 5.38* 3.80 - 5.10 Million/uL Final  . Hemoglobin 10/11/2016 14.2  11.7 - 15.5 g/dL Final  . HCT 10/11/2016 43.5  35.0 - 45.0 % Final  . MCV 10/11/2016 80.9  80.0 - 100.0 fL Final  . MCH 10/11/2016 26.4* 27.0 - 33.0 pg Final  . MCHC 10/11/2016 32.6  32.0 - 36.0 g/dL Final  . RDW 10/11/2016 13.5  11.0 - 15.0 % Final  . Platelets 10/11/2016 170  140 - 400 Thousand/uL Final  . MPV 10/11/2016 11.9  7.5 - 12.5 fL Final  . Neutro Abs 10/11/2016 1,485* 1,500 - 7,800 cells/uL Final  . Lymphs Abs 10/11/2016 3,648  850 - 3,900  cells/uL Final  . WBC mixed population 10/11/2016 534  200 - 950 cells/uL Final  . Eosinophils Absolute 10/11/2016 93  15 - 500 cells/uL Final  . Basophils Absolute 10/11/2016 41  0 - 200 cells/uL Final  . Neutrophils Relative % 10/11/2016 25.6  % Final  . Total Lymphocyte 10/11/2016 62.9  %  Final  . Monocytes Relative 10/11/2016 9.2  % Final  . Eosinophils Relative 10/11/2016 1.6  % Final  . Basophils Relative 10/11/2016 0.7  % Final  . Glucose, Bld 10/11/2016 114* 65 - 99 mg/dL Final   Comment: .            Fasting reference interval . For someone without known diabetes, a glucose value between 100 and 125 mg/dL is consistent with prediabetes and should be confirmed with a follow-up test. .   . BUN 10/11/2016 19  7 - 25 mg/dL Final  . Creat 10/11/2016 0.91* 0.60 - 0.88 mg/dL Final   Comment: For patients >2 years of age, the reference limit for Creatinine is approximately 13% higher for people identified as African-American. .   . GFR, Est Non African American 10/11/2016 60  > OR = 60 mL/min/1.75m Final  . GFR, Est African American 10/11/2016 69  > OR = 60 mL/min/1.720mFinal  . BUN/Creatinine Ratio 10/11/2016 21  6 - 22 (calc) Final  . Sodium 10/11/2016 141  135 - 146 mmol/L Final  . Potassium 10/11/2016 4.1  3.5 - 5.3 mmol/L Final  . Chloride 10/11/2016 103  98 - 110 mmol/L Final  . CO2 10/11/2016 30  20 - 32 mmol/L Final  . Calcium 10/11/2016 9.7  8.6 - 10.4 mg/dL Final  . Total Protein 10/11/2016 6.6  6.1 - 8.1 g/dL Final  . Albumin 10/11/2016 4.0  3.6 - 5.1 g/dL Final  . Globulin 10/11/2016 2.6  1.9 - 3.7 g/dL (calc) Final  . AG Ratio 10/11/2016 1.5  1.0 - 2.5 (calc) Final  . Total Bilirubin 10/11/2016 0.5  0.2 - 1.2 mg/dL Final  . Alkaline phosphatase (APISO) 10/11/2016 85  33 - 130 U/L Final  . AST 10/11/2016 39* 10 - 35 U/L Final  . ALT 10/11/2016 52* 6 - 29 U/L Final    (this displays the last labs from the last 3 days)  No results found for: TOTALPROTELP, ALBUMINELP, A1GS, A2GS, BETS, BETA2SER, GAMS, MSPIKE, SPEI (this displays SPEP labs)  No results found for: KPAFRELGTCHN, LAMBDASER, KAPLAMBRATIO (kappa/lambda light chains)  No results found for: HGBA, HGBA2QUANT, HGBFQUANT, HGBSQUAN (Hemoglobinopathy evaluation)   Lab Results  Component  Value Date   LDH 162 10/22/2006    No results found for: IRON, TIBC, IRONPCTSAT (Iron and TIBC)  No results found for: FERRITIN  Urinalysis    Component Value Date/Time   COLORURINE YELLOW 09/27/2015 [place]9/19/2017 1532   LABSPEC 1.015 09/27/2015 1532   PHURINE 6.5 09/27/2015 1532   GLUCOSEU NEGATIVE 09/27/2015 1532   HGBUR TRACE (A) 09/27/2015 1532   BILIRUBINUR NEGATIVE 09/27/2015 1532   KETONESUR NEGATIVE 09/27/2015 1532   PROTEINUR NEGATIVE 09/27/2015 1532   UROBILINOGEN 0.2 01/19/2013 0954   NITRITE NEGATIVE 09/27/2015 1532   LEUKOCYTESUR NEGATIVE 09/27/2015 1532     STUDIES: [employer] [facility] Right [employer] Axilla  Result Date: 03/26/2017 CLINICAL DATA:  Delayed follow-up for probably benign right breast calcifications. The patient states she has noticed right nipple inversion over course of the past several months. EXAM: DIGITAL DIAGNOSTIC BILATERAL MAMMOGRAM WITH CAD AND TOMO RIGHT BREAST  ULTRASOUND COMPARISON:  Previous exam(s). ACR Breast Density Category b: There are scattered areas of fibroglandular density. FINDINGS: There is an irregular spiculated mass in the retroareolar right breast with associated calcifications with mass and calcifications together measuring approximately 3.3 cm. There is associated retraction of the right nipple. No suspicious masses or calcifications are seen in the left breast. Mammographic images were processed with CAD. Physical examination of the right breast reveals a firm fixed right retroareolar mass with associated flattening of the nipple. This is tender to palpation. Targeted ultrasound of the retroareolar right breast was performed demonstrating an irregular hypoechoic mass in the immediate retroareolar right breast measuring approximately 2.3 x 1.5 x 2.8 cm. This corresponds well with the mass seen in the right breast at mammography. There are 2 lymph nodes in the right axilla with borderline thickened cortices, 1 of  which measures 1 x 0.9 x 0.6 cm with a cortex thickness of 0.4 mm. IMPRESSION: 1. Highly suspicious right retroareolar mass with associated calcifications, together the mass and calcifications measure up to 3.3 cm mammographically. 2.  Borderline right axillary lymph node. RECOMMENDATION: 1. Ultrasound-guided biopsy of the retroareolar right breast mass is recommended. 2. Ultrasound-guided biopsy of 1 of the lymph nodes in the low right axilla containing a borderline thickened cortex. I have discussed the findings and recommendations with the patient. Results were also provided in writing at the conclusion of the visit. If applicable, a reminder letter will be sent to the patient regarding the next appointment. BI-RADS CATEGORY  5: Highly suggestive of malignancy. Electronically Signed   By: Everlean Alstrom M.D.   On: 03/26/2017 09:48   Mm Diag Breast Tomo Bilateral  Result Date: 03/26/2017 CLINICAL DATA:  Delayed follow-up for probably benign right breast calcifications. The patient states she has noticed right nipple inversion over course of the past several months. EXAM: DIGITAL DIAGNOSTIC BILATERAL MAMMOGRAM WITH CAD AND TOMO RIGHT BREAST ULTRASOUND COMPARISON:  Previous exam(s). ACR Breast Density Category b: There are scattered areas of fibroglandular density. FINDINGS: There is an irregular spiculated mass in the retroareolar right breast with associated calcifications with mass and calcifications together measuring approximately 3.3 cm. There is associated retraction of the right nipple. No suspicious masses or calcifications are seen in the left breast. Mammographic images were processed with CAD. Physical examination of the right breast reveals a firm fixed right retroareolar mass with associated flattening of the nipple. This is tender to palpation. Targeted ultrasound of the retroareolar right breast was performed demonstrating an irregular hypoechoic mass in the immediate retroareolar right breast  measuring approximately 2.3 x 1.5 x 2.8 cm. This corresponds well with the mass seen in the right breast at mammography. There are 2 lymph nodes in the right axilla with borderline thickened cortices, 1 of which measures 1 x 0.9 x 0.6 cm with a cortex thickness of 0.4 mm. IMPRESSION: 1. Highly suspicious right retroareolar mass with associated calcifications, together the mass and calcifications measure up to 3.3 cm mammographically. 2.  Borderline right axillary lymph node. RECOMMENDATION: 1. Ultrasound-guided biopsy of the retroareolar right breast mass is recommended. 2. Ultrasound-guided biopsy of 1 of the lymph nodes in the low right axilla containing a borderline thickened cortex. I have discussed the findings and recommendations with the patient. Results were also provided in writing at the conclusion of the visit. If applicable, a reminder letter will be sent to the patient regarding the next appointment. BI-RADS CATEGORY  5: Highly suggestive of malignancy. Electronically Signed   By:  Everlean Alstrom M.D.   On: 03/26/2017 09:48   [place] Axillary Node Core Biopsy Right  Result Date: 03/29/2017 CLINICAL DATA:  Right axillary lymph node with cortical thickening was recommended for biopsy given suspicious findings in the retroareolar right breast. EXAM: [place] AXILLARY NODE CORE BIOPSY RIGHT COMPARISON:  Previous exam(s). FINDINGS: I met with the patient and we discussed the procedure of ultrasound-guided biopsy, including benefits and alternatives. We discussed the high likelihood of a successful procedure. We discussed the risks of the procedure, including infection, bleeding, tissue injury, clip migration, and inadequate sampling. Informed written consent was given. The usual time-out protocol was performed immediately prior to the procedure. Using sterile technique and 1% Lidocaine as local anesthetic, under direct ultrasound visualization, a 14 gauge spring-loaded device was used to perform biopsy of a right  axillary lymph node with cortical thickening using a lateral approach. At the conclusion of the procedure a HydroMARK tissue marker clip was deployed into the biopsy cavity. Follow up 2 view mammogram was performed and dictated separately. IMPRESSION: Ultrasound guided biopsy of right axillary lymph node. No apparent complications. Electronically Signed   By: Curlene Dolphin M.D.   On: 03/29/2017 16:31   Mm Clip Placement Right  Result Date: 03/29/2017 CLINICAL DATA:  Ultrasound-guided biopsies were performed of a suspicious retroareolar right breast mass and a right axillary lymph node. EXAM: DIAGNOSTIC RIGHT MAMMOGRAM POST ULTRASOUND BIOPSY COMPARISON:  Previous exam(s). FINDINGS: Mammographic images were obtained following ultrasound guided biopsy of a retroareolar right breast mass and a right axillary lymph node. A heart shaped biopsy clip is present in the retroareolar right breast mass. A HydroMARK biopsy clip is present within a right axillary lymph node. IMPRESSION: Satisfactory position of heart shaped and HydroMARK biopsy clips. Final Assessment: Post Procedure Mammograms for Marker Placement Electronically Signed   By: Curlene Dolphin M.D.   On: 03/29/2017 16:28   [place] Rt Breast Bx W Loc Dev 1st Lesion Img Bx Spec US Guide  Addendum Date: 04/01/2017   ADDENDUM REPORT: 04/01/2017 12:56 ADDENDUM: Pathology revealed Grade II - INVASIVE MAMMARY CARCINOMA, of RIGHT breast, retroareolar. Pathology revealed BENIGN LYMPH NODE TISSUE of RIGHT axilla. This was found to be concordant by Dr. Curlene Dolphin. Pathology results were discussed with the patient by telephone. The patient reported doing well after the biopsy with tenderness at the site. Post biopsy instructions and care were reviewed and questions were answered. The patient was encouraged to call [facility] for any additional concerns. The patient was referred to [facility] on April 10, 2017. Pathology results reported by Roselind Messier, RN on 04/01/2017. Electronically Signed   By: Curlene Dolphin M.D.   On: 04/01/2017 12:56   Result Date: 04/01/2017 CLINICAL DATA:  Suspicious palpable retroareolar right breast mass. EXAM: ULTRASOUND GUIDED RIGHT BREAST CORE NEEDLE BIOPSY COMPARISON:  Previous exam(s). FINDINGS: I met with the patient and we discussed the procedure of ultrasound-guided biopsy, including benefits and alternatives. We discussed the high likelihood of a successful procedure. We discussed the risks of the procedure, including infection, bleeding, tissue injury, clip migration, and inadequate sampling. Informed written consent was given. The usual time-out protocol was performed immediately prior to the procedure. Lesion quadrant: The mass involves all 4 quadrants of the retroareolar right breast. Using sterile technique and 1% Lidocaine as local anesthetic, under direct ultrasound visualization, a 12 gauge spring-loaded device was used to perform biopsy of a hypoechoic  central retroareolar right breast mass using a lateral approach. At the conclusion of the procedure a heart shaped tissue marker clip was deployed into the biopsy cavity. Follow up 2 view mammogram was performed and dictated separately. IMPRESSION: Ultrasound guided biopsy of the right breast. No apparent complications. Electronically Signed: By: Curlene Dolphin M.D. On: 03/29/2017 16:30    ELIGIBLE FOR AVAILABLE RESEARCH PROTOCOL: [employer] study  ASSESSMENT: 83 y.o. [place] woman status post central right breast biopsy 03/29/2017 for a clinical T1c N0, stage IA invasive lobular carcinoma, grade 2, strongly estrogen and progesterone receptor positive, also HER-2 amplified, with an MIB-1 of 25%  (1) definitive surgery pending  (2) adjuvant radiation to follow breast conservation  (3) trastuzumab adjuvantly for 1 year  (4) antiestrogens at the completion of local  treatment  PLAN: We spent the better part of today's hour-long appointment discussing the biology of her diagnosis and the specifics of her situation. We first reviewed the fact that cancer is not one disease but more than 100 different diseases and that it is important to keep them separate-- otherwise when friends and relatives discuss their own cancer experiences with Tara Cox confusion can result. Similarly we explained that if breast cancer spreads to the bone or liver, the patient would not have bone cancer or liver cancer, but breast cancer in the bone and breast cancer in the liver: one cancer in three places-- not 3 different cancers which otherwise would have to be treated in 3 different ways.  We discussed the difference between local and systemic therapy. In terms of loco-regional treatment, lumpectomy plus radiation is equivalent to mastectomy as far as survival is concerned. For this reason, and because the cosmetic results are generally superior, we recommend breast conserving surgery.  The patient is aware, however, that lobular breast cancers can "hide" and in many cases margins are positive after initial surgery.  This may require reexcision.  We then discussed the rationale for systemic therapy. There is some risk that this cancer may have already spread to other parts of her body. Patients frequently ask at this point about bone scans, CAT scans and PET scans to find out if they have occult breast cancer somewhere else. The problem is that in early stage disease we are much more likely to find false positives then true cancers and this would expose the patient to unnecessary procedures as well as unnecessary radiation. Scans cannot answer the question the patient really would like to know, which is whether she has microscopic disease elsewhere in her body. For those reasons we do not recommend them.  Of course we would proceed to aggressive evaluation of any symptoms that might suggest  metastatic disease, but that is not the case here.  Next we went over the options for systemic therapy which are anti-estrogens, anti-HER-2 immunotherapy, and chemotherapy. Tara Cox meets criteria for anti-HER-2 immunotherapy. She is also a good candidate for anti-estrogens.  The question of chemotherapy is more complicated. Chemotherapy is most effective in rapidly growing, aggressive tumors. It is much less effective in low-grade, slow growing cancers. Tara Cox 's cancer is intermediate on both counts, but it is also lobular, and given the patient's age, and the fact that the contribution of chemotherapy would be minimal compared to the strong wrist reductions to be obtained from anti-HER-2 and antiestrogen treatments, I feel comfortable foregoing chemotherapy in this case.  Accordingly an Oncotype will not be sent..   The overall plan, then, is for definitive surgery, with port placement,  followed by trastuzumab for 1 year, and consideration of adjuvant radiation.  At the completion of local treatment she will start antiestrogens, to be continued for 5 years.  Tara Cox has a good understanding of the overall plan. She agrees with it. She knows the goal of treatment in her case is cure. She will call with any problems that may develop before her next visit here.   Tara Cox, Virgie Dad, MD  04/10/17 6:31 PM Medical Oncology and Hematology [facility] [address] [place], [place] [address] Tel. [phone]    Fax. [phone]  This document serves as a record of services personally performed by Lurline Del, MD. It was created on his behalf by Sheron Nightingale, a trained medical scribe. The creation of this record is based on the scribe's personal observations and the provider's statements to them.   I have reviewed the above documentation for accuracy and completeness, and I agree with the above.

## 2017-04-09 ENCOUNTER — Telehealth: Payer: Self-pay | Admitting: *Deleted

## 2017-04-09 NOTE — Telephone Encounter (Signed)
  Oncology Nurse Navigator Documentation  Navigator Location: [facility] (04/09/17 1300)   )Navigator Encounter Type: Introductory phone call;Telephone (04/09/17 1300) Telephone: Lahoma Crocker Call;Appt Confirmation/Clarification (04/09/17 1300)                                                  Time Spent with Patient: 15 (04/09/17 1300)

## 2017-04-10 ENCOUNTER — Other Ambulatory Visit: Payer: Self-pay

## 2017-04-10 ENCOUNTER — Inpatient Hospital Stay: Payer: Medicare Other | Attending: Oncology | Admitting: Oncology

## 2017-04-10 ENCOUNTER — Ambulatory Visit: Payer: Self-pay | Admitting: Surgery

## 2017-04-10 ENCOUNTER — Encounter: Payer: Self-pay | Admitting: *Deleted

## 2017-04-10 ENCOUNTER — Other Ambulatory Visit: Payer: Medicare Other

## 2017-04-10 ENCOUNTER — Ambulatory Visit: Payer: Medicare Other | Attending: Surgery | Admitting: Physical Therapy

## 2017-04-10 ENCOUNTER — Encounter: Payer: Self-pay | Admitting: Physical Therapy

## 2017-04-10 ENCOUNTER — Encounter: Payer: Self-pay | Admitting: Oncology

## 2017-04-10 ENCOUNTER — Ambulatory Visit
Admission: RE | Admit: 2017-04-10 | Discharge: 2017-04-10 | Disposition: A | Discharge status: Home / Self Care | Payer: Medicare Other | Source: Ambulatory Visit | Attending: Radiation Oncology | Admitting: Radiation Oncology

## 2017-04-10 VITALS — BP 154/68 | HR 99 | Temp 98.0°F | Resp 17 | Ht 67.0 in | Wt 240.4 lb

## 2017-04-10 DIAGNOSIS — C50111 Malignant neoplasm of central portion of right female breast: Secondary | ICD-10-CM

## 2017-04-10 DIAGNOSIS — Z87891 Personal history of nicotine dependence: Secondary | ICD-10-CM | POA: Diagnosis not present

## 2017-04-10 DIAGNOSIS — I10 Essential (primary) hypertension: Secondary | ICD-10-CM | POA: Diagnosis not present

## 2017-04-10 DIAGNOSIS — Z90710 Acquired absence of both cervix and uterus: Secondary | ICD-10-CM | POA: Diagnosis not present

## 2017-04-10 DIAGNOSIS — Z170 Estrogen receptor positive status [ER+]: Secondary | ICD-10-CM | POA: Insufficient documentation

## 2017-04-10 DIAGNOSIS — R293 Abnormal posture: Secondary | ICD-10-CM | POA: Insufficient documentation

## 2017-04-10 DIAGNOSIS — C50911 Malignant neoplasm of unspecified site of right female breast: Secondary | ICD-10-CM

## 2017-04-10 NOTE — Progress Notes (Signed)
Nutrition Assessment  Reason for Assessment:  Pt seen in Breast Clinic  ASSESSMENT:   83 year old female with new diagnosis of breast cancer.  Past medical history reviewed.    Patient reports normal appetite.  Medications:  reviewed  Labs: reviewed  Anthropometrics:   Height: 67 inches Weight: 240 lb 6.4 oz BMI: 37   NUTRITION DIAGNOSIS: Food and nutrition related knowledge deficit related to new diagnosis of breast cancer as evidenced by no prior need for nutrition related information.  INTERVENTION:   Discussed and provided packet of information regarding nutritional tips for breast cancer patients.  Questions answered.  Teachback method used.  Contact information provided and patient knows to contact me with questions/concerns.    MONITORING, EVALUATION, and GOAL: Pt will consume a healthy plant based diet to maintain lean body mass throughout treatment.   Tara Cox B. Zenia Resides, [place], [place] Registered Dietitian [phone] (pager)

## 2017-04-10 NOTE — H&P (View-Only) (Signed)
Tara Cox Documented: 04/10/2017 7:30 AM Location: [facility] Patient #: [id number] DOB: 27-Oct-1934 Married / Language: English / Race: Black or African American Female  History of Present Illness Marcello Moores A. Shiri Hodapp MD; 04/10/2017 10:06 AM) Patient words: Pt presents at the request of Dr Lisbeth Renshaw for right breast nipple retration times 1 year. Pt noted a right breast mass for 6 months or so after hitting breast. Pt says the area is brused and sore. She denies fever and chills. Mammogram and US show 2.8 cm subareolar mass ILC ER POS PR POS HER 2 NEU POS. LN bx benign. She has no nipple dischareg. The mass is growing.  The patient is a 83 year old female.   Medication History Tawni Pummel, RN; 04/10/2017 7:30 AM) Medications Reconciled     Review of Systems (Jaysion Ramseyer A. Dreon Pineda MD; 04/10/2017 10:06 AM) All other systems negative   Physical Exam (Cornelis Kluver A. Nohemy Koop MD; 04/10/2017 10:07 AM)  General Mental Status-Alert. General Appearance-Consistent with stated age. Hydration-Well hydrated. Voice-Normal.  Head and Neck Head-normocephalic, atraumatic with no lesions or palpable masses. Trachea-midline. Thyroid Gland Characteristics - normal size and consistency.  Eye Eyeball - Bilateral-Extraocular movements intact. Sclera/Conjunctiva - Bilateral-No scleral icterus.  Chest and Lung Exam Chest and lung exam reveals -quiet, even and easy respiratory effort with no use of accessory muscles and on auscultation, normal breath sounds, no adventitious sounds and normal vocal resonance. Inspection Chest Wall - Normal. Back - normal.  Breast Note: right breast shows retracted nipple and a 4 cm palpable area central right breast . left breast normal  Cardiovascular Cardiovascular examination reveals -normal heart sounds, regular rate and rhythm with no murmurs and normal pedal pulses bilaterally. Note: a fib  Neurologic Neurologic evaluation  reveals -alert and oriented x 3 with no impairment of recent or remote memory. Mental Status-Normal.  Musculoskeletal Normal Exam - Left-Upper Extremity Strength Normal and Lower Extremity Strength Normal. Normal Exam - Right-Upper Extremity Strength Normal and Lower Extremity Strength Normal.  Lymphatic Head & Neck  General Head & Neck Lymphatics: Bilateral - Description - Normal. Axillary  General Axillary Region: Bilateral - Description - Normal. Tenderness - Non Tender.    Assessment & Plan (Demya Scruggs A. Jamicah Anstead MD; 04/10/2017 10:10 AM)  BREAST CANCER, RIGHT (C50.911) Impression: discussed options of lumpectomy vs mastectomy Mastectomy a better option given location and breast size She does not want reconstruction She may need a port as well WILL SET UP FOR RIGHT SIMPLE MASTECTOMY AND SLN MAPPING Discussed treatment options for breast cancer to include breast conservation vs mastectomy with reconstruction. Pt has decided on mastectomy. Risk include bleeding, infection, flap necrosis, pain, numbness, recurrence, hematoma, other surgery needs. Pt understands and agrees to proceed. Risk of sentinel lymph node mapping include bleeding, infection, lymphedema, shoulder pain. stiffness, dye allergy. cosmetic deformity , blood clots, death, need for more surgery. Pt agres to proceed. Pt requires port placement for chemotherapy. Risk include bleeding, infection, pneumothorax, hemothorax, mediastinal injury, nerve injury , blood vessel injury, strke, blood clots, death, migration. embolization and need for additional procedures. Pt agrees to proceed.  Current Plans Use of a central venous catheter for intravenous therapy was discussed. Technique of catheter placement using ultrasound and fluoroscopy guidance was discussed. Risks such as bleeding, infection, pneumothorax, catheter occlusion, reoperation, and other risks were discussed. I noted a good likelihood this will help address the  problem. Questions were answered. The patient expressed understanding & wishes to proceed. You are being scheduled for surgery- Our schedulers  will call you.   Cardiac clearance  You should hear from our office's scheduling department within 5 working days about the location, date, and time of surgery. We try to make accommodations for patient's preferences in scheduling surgery, but sometimes the OR schedule or the surgeon's schedule prevents [place] from making those accommodations.  If you have not heard from our office [phone]) in 5 working days, call the office and ask for your surgeon's nurse.  If you have other questions about your diagnosis, plan, or surgery, call the office and ask for your surgeon's nurse.  We discussed the staging and pathophysiology of breast cancer. We discussed all of the different options for treatment for breast cancer including surgery, chemotherapy, radiation therapy, Herceptin, and antiestrogen therapy. We discussed a sentinel lymph node biopsy as she does not appear to having lymph node involvement right now. We discussed the performance of that with injection of radioactive tracer and blue dye. We discussed that she would have an incision underneath her axillary hairline. We discussed that there is a bout a 10-20% chance of having a positive node with a sentinel lymph node biopsy and we will await the permanent pathology to make any other first further decisions in terms of her treatment. One of these options might be to return to the operating room to perform an axillary lymph node dissection. We discussed about a 1-2% risk lifetime of chronic shoulder pain as well as lymphedema associated with a sentinel lymph node biopsy. We discussed the options for treatment of the breast cancer which included lumpectomy versus a mastectomy. We discussed the performance of the lumpectomy with a wire placement. We discussed a 10-20% chance of a positive margin requiring  reexcision in the operating room. We also discussed that she may need radiation therapy or antiestrogen therapy or both if she undergoes lumpectomy. We discussed the mastectomy and the postoperative care for that as well. We discussed that there is no difference in her survival whether she undergoes lumpectomy with radiation therapy or antiestrogen therapy versus a mastectomy. There is a slight difference in the local recurrence rate being 3-5% with lumpectomy and about 1% with a mastectomy. We discussed the risks of operation including bleeding, infection, possible reoperation. She understands her further therapy will be based on what her stages at the time of her operation.  Pt Education - flb breast cancer surgery: discussed with patient and provided information. Pt Education - [facility] Mastectomy HCI Pt Education - ABC (After Breast Cancer) Class Info: discussed with patient and provided information.  MALIGNANT NEOPLASM INVOLVING BOTH NIPPLE AND AREOLA OF RIGHT BREAST IN FEMALE, ESTROGEN RECEPTOR POSITIVE (C50.021)

## 2017-04-10 NOTE — Therapy (Signed)
[place], [place], [address] Phone: [phone]   Fax:  [phone]  Physical Therapy Evaluation  Patient Details  Name: Tara Cox MRN: [id number] Date of Birth: [birth year] Referring Provider: Dr. Erroll Luna   Encounter Date: 04/10/2017  PT End of Session - 04/10/17 1202    Visit Number  1    Number of Visits  2    Date for PT Re-Evaluation  06/05/17    PT Start Time  0955    PT Stop Time  1005 Also met with pt from 1035-1059 for a total of 34 minutes    PT Time Calculation (min)  10 min    Activity Tolerance  Patient tolerated treatment well    Behavior During Therapy  [facility] for tasks assessed/performed       Past Medical History:  Diagnosis Date  . A-fib ([place])   . Arthritis   . 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 DDD (degenerative disc disease), lumbar 12/08/2015   S/P discectomy   . Diaphragmatic hernia 12/08/2015  . Edema of lower extremity 06/29/09   Lower Venous Exam- normal. No evidence of thrombus or thrombophlebitis.  . Esophagitis 12/08/2015  . Frequent urination at night   . GERD (gastroesophageal reflux disease)   . Hemorrhoids   . History of calcium pyrophosphate deposition disease (CPPD) 12/08/2015  . History of miscarriage 12/08/2015   x3  . History of total right knee replacement 12/08/2015  . Hypertension 02/24/09   Echo-EF 62%; Myocardial Perfusion Study-Normal. No signifcant ischemia noted.  . IBS (irritable bowel syndrome)   . Migraine   . Osteoarthritis of both feet 12/08/2015  . Osteoarthritis of both hands 12/08/2015  . PONV (postoperative nausea and vomiting)   . Uterine fibroid 12/08/2015    Past Surgical History:  Procedure Laterality Date  . ABDOMINAL HYSTERECTOMY  1974  . APPENDECTOMY    . BACK SURGERY  2001  . BUNIONECTOMY  1986  . CESAREAN SECTION  1971  . COLONOSCOPY    . COLONOSCOPY WITH PROPOFOL N/A 05/28/2014   Procedure: COLONOSCOPY WITH  PROPOFOL;  Surgeon: Carol Ada, MD;  Location: [facility];  Service: Endoscopy;  Laterality: N/A;  . ESOPHAGOGASTRODUODENOSCOPY (EGD) WITH PROPOFOL N/A 05/28/2014   Procedure: ESOPHAGOGASTRODUODENOSCOPY (EGD) WITH PROPOFOL;  Surgeon: Carol Ada, MD;  Location: [facility];  Service: Endoscopy;  Laterality: N/A;  . EXPLORATORY LAPAROTOMY     open procedure  . EYE SURGERY Bilateral    cataract removal  . TOTAL KNEE ARTHROPLASTY Right 01/26/2013   DR Ronnie Derby  . TOTAL KNEE ARTHROPLASTY Right 01/26/2013   Procedure: TOTAL KNEE ARTHROPLASTY;  Surgeon: Vickey Huger, MD;  Location: [place];  Service: Orthopedics;  Laterality: Right;  . TOTAL SHOULDER ARTHROPLASTY    . UTERINE FIBROID SURGERY  1967    There were no vitals filed for this visit.   Subjective Assessment - 04/10/17 1155    Subjective  Patient reports she is here today to be seen by her medical team for her newly diagnosed right breast cancer.    Patient is accompained by:  Family member    Pertinent History  Patient was diagnosed on 03/26/17 with right triple positive invasive lobular carcinoma breast cancer. It measures 2.8 cm. She had previous right rotator cuff repair in 3/18, a lumbar discectomy in 2001, and a right total knee replacement in 2014.    Patient Stated Goals  Reduce lymphedema risk reduction and learn post op shoulder ROM HEP  Currently in Pain?  No/denies         [facility] PT Assessment - 04/10/17 0001      Assessment   Medical Diagnosis  Right breast cancer    Referring Provider  Dr. Marcello Moores [place]    Onset Date/Surgical Date  03/26/17    Hand Dominance  Right    Prior Therapy  none      Precautions   Precautions  Other (comment)    Precaution Comments  active cancer; right RCR 3/18      Restrictions   Weight Bearing Restrictions  No      Balance Screen   Has the patient fallen in the past 6 months  No    Has the patient had a decrease in activity level because of a fear of falling?   No    Is the  patient reluctant to leave their home because of a fear of falling?   No      Home Film/video editor residence    Living Arrangements  Spouse/significant other    Available Help at Discharge  Family      Prior Function   Level of [place]  Retired    Leisure  She rides a bike or does the UBE 3x/week for 30 minutes      Cognition   Overall Cognitive Status  Within Functional Limits for tasks assessed      Posture/Postural Control   Posture/Postural Control  Postural limitations    Postural Limitations  Rounded Shoulders;Forward head      ROM / Strength   AROM / PROM / Strength  AROM;Strength      AROM   AROM Assessment Site  Shoulder;Cervical    Right/Left Shoulder  Right;Left    Right Shoulder Extension  50 Degrees    Right Shoulder Flexion  147 Degrees    Right Shoulder ABduction  139 Degrees    Right Shoulder Internal Rotation  54 Degrees    Right Shoulder External Rotation  78 Degrees    Left Shoulder Extension  59 Degrees    Left Shoulder Flexion  142 Degrees    Left Shoulder ABduction  147 Degrees    Left Shoulder Internal Rotation  58 Degrees    Left Shoulder External Rotation  80 Degrees    Cervical Flexion  WNL    Cervical Extension  50% limited    Cervical - Right Side Bend  WNL    Cervical - Left Side Bend  50% limited    Cervical - Right Rotation  WNL    Cervical - Left Rotation  50% limited      Strength   Overall Strength  Within functional limits for tasks performed        LYMPHEDEMA/ONCOLOGY QUESTIONNAIRE - 04/10/17 1200      Type   Cancer Type  Right breast cancer      Lymphedema Assessments   Lymphedema Assessments  Upper extremities      Right Upper Extremity Lymphedema   10 cm Proximal to Olecranon Process  33.4 cm    Olecranon Process  28.1 cm    10 cm Proximal to Ulnar Styloid Process  24.3 cm    Just Proximal to Ulnar Styloid Process  17.7 cm    Across Hand at [employer]  18.7  cm    At [place] of 2nd Digit  6.9 cm      Left Upper Extremity Lymphedema  10 cm Proximal to Olecranon Process  35.1 cm    Olecranon Process  28 cm    10 cm Proximal to Ulnar Styloid Process  24.3 cm    Just Proximal to Ulnar Styloid Process  17.3 cm    Across Hand at [employer]  18.3 cm    At [place] of 2nd Digit  6.9 cm             Objective measurements completed on examination: See above findings.    Patient was instructed today in a home exercise program today for post op shoulder range of motion. These included active assist shoulder flexion in sitting, scapular retraction, wall walking with shoulder abduction, and hands behind head external rotation.  She was encouraged to do these twice a day, holding 3 seconds and repeating 5 times when permitted by her physician.     PT Education - 04/10/17 1201    Education provided  Yes    Education Details  Lymphedema risk reduction and post op shoulder ROM HEP    Person(s) Educated  Patient;Spouse    Methods  Explanation;Demonstration;Handout    Comprehension  Returned demonstration;Verbalized understanding          PT Long Term Goals - 04/10/17 1209      PT LONG TERM GOAL #1   Title  Patient will demonstrate she has returned to baseline related to shoulder ROM and function post operatively.    Time  [facility] Goals - 04/10/17 1208      Patient will be able to verbalize understanding of pertinent lymphedema risk reduction practices relevant to her diagnosis specifically related to skin care.   Time  1    Period  Days    Status  Achieved      Patient will be able to return demonstrate and/or verbalize understanding of the post-op home exercise program related to regaining shoulder range of motion.   Time  1    Period  Days    Status  Achieved      Patient will be able to verbalize understanding of the importance of attending the postoperative After Breast Cancer Class  for further lymphedema risk reduction education and therapeutic exercise.   Time  1    Period  Days    Status  Achieved            Plan - 04/10/17 1204    Clinical Impression Statement  Patient was diagnosed on 03/26/17 with right triple positive invasive lobular carcinoma breast cancer. It measures 2.8 cm. She had previous right rotator cuff repair in 3/18, a lumbar discectomy in 2001, and a right total knee replacement in 2014. Her multidisciplinary medical team met prior to her assessments to determine a recommended treatment plan. She is planning to have a right mastectomy and sentinel node biopsy followed by chemotherapy. She will benefit from a post op PT reassessment to determine needs.    History and Personal Factors relevant to plan of care:  Previous shoulder surgery; advanced age    Clinical Presentation  Evolving    Clinical Presentation due to:  Will undergo major surgery and then chemo and it is unclear how she will tolerate treatment    Clinical Decision Making  Moderate    Rehab Potential  Good    Clinical Impairments Affecting Rehab Potential  Previous rotator cuff repair on right side  PT Frequency  -- eval and 1 f/u visit    PT Treatment/Interventions  ADLs/Self Care Home Management;Therapeutic exercise;Patient/family education    PT Next Visit Plan  Will f/u after surgery to determine needs    PT Home Exercise Plan  Post op shoulder ROM HEP    Consulted and Agree with Plan of Care  Patient;Family member/caregiver    Family Member Consulted  Husband and granddaughter       Patient will benefit from skilled therapeutic intervention in order to improve the following deficits and impairments:  Pain, Decreased range of motion, Decreased knowledge of precautions, Impaired UE functional use, Postural dysfunction  Visit Diagnosis: Malignant neoplasm of central portion of right breast in female, estrogen receptor positive ([place]) - Plan: PT plan of care  cert/re-cert  Abnormal posture - Plan: PT plan of care cert/re-cert   Patient will follow up at outpatient cancer rehab 3-4 weeks following surgery.  If the patient requires physical therapy at that time, a specific plan will be dictated and sent to the referring physician for approval. The patient was educated today on appropriate basic range of motion exercises to begin post operatively and the importance of attending the After Breast Cancer class following surgery.  Patient was educated today on lymphedema risk reduction practices as it pertains to recommendations that will benefit the patient immediately following surgery.  She verbalized good understanding.     Problem List Patient Active Problem List   Diagnosis Date Noted  . Malignant neoplasm of central portion of right breast in female, estrogen receptor positive ([place]) 04/02/2017  . Primary insomnia 09/27/2016  . ANA positive 09/27/2016  . Complete tear of right rotator cuff 05/11/2016  . High risk medication use 05/07/2016  . Uterine fibroid 12/08/2015  . History of miscarriage 12/08/2015  . Esophagitis 12/08/2015  . Diaphragmatic hernia 12/08/2015  . Calcium pyrophosphate deposition disease 12/08/2015  . Osteoarthritis of both hands 12/08/2015  . Osteoarthritis of both feet 12/08/2015  . History of total right knee replacement 12/08/2015  . Chondromalacia of both patellae 12/08/2015  . DDD (degenerative disc disease), lumbar 12/08/2015  . Essential hypertension 06/26/2013  . Paroxysmal atrial fibrillation ([place]) 06/26/2013  . Hyperlipidemia 06/26/2013  . DJD (degenerative joint disease) of knee 01/26/2013  . Hemorrhoids 07/11/2010  . IBS (irritable bowel syndrome) 07/11/2010  . Fatigue/loss of sleep 07/11/2010  . Night sweats 07/11/2010  . Chills 07/11/2010  . Weight gain 07/11/2010  . Inflammatory arthritis 07/11/2010  . Incontinence of urine 07/11/2010  . Thrombosed external hemorrhoids 07/11/2010    Annia Friendly, PT 04/10/17 12:12 PM  [place], [place], [address] Phone: [phone]   Fax:  [phone]  Name: Tara Cox MRN: [id number] Date of Birth: 1934-03-24

## 2017-04-10 NOTE — Progress Notes (Signed)
Clinical Social Work [place] Psychosocial Distress Screening [place]  Patient completed distress screening protocol and scored a 5 on the Psychosocial Distress Thermometer which indicates moderate distress. Clinical Social Worker met with patient and patients family in [facility] to assess for distress and other psychosocial needs. Patient stated she was feeling overwhelmed but felt "better" after meeting with the treatment team and getting more information on her treatment plan. CSW and patient discussed common feeling and emotions when being diagnosed with cancer, and the importance of support during treatment. CSW informed patient of the support team and support services at [facility]. CSW provided contact information and encouraged patient to call with any questions or concerns.  ONCBCN DISTRESS SCREENING 04/10/2017  Screening Type Initial Screening  Distress experienced in past week (1-10) 5  Emotional problem type Adjusting to illness     Johnnye Lana, MSW, LCSW, OSW-C Clinical Social Worker [facility] [phone]

## 2017-04-10 NOTE — H&P (Signed)
Tara Cox Documented: 04/10/2017 7:30 AM Location: [facility] Patient #: [id number] DOB: [birth year] Married / Language: English / Race: Black or African American Female  History of Present Illness Tara Moores A. Tara Mirsky MD; 04/10/2017 10:06 AM) Patient words: Pt presents at the request of Dr Tara Cox for right breast nipple retration times 1 year. Pt noted a right breast mass for 6 months or so after hitting breast. Pt says the area is brused and sore. She denies fever and chills. Mammogram and US show 2.8 cm subareolar mass ILC ER POS PR POS HER 2 NEU POS. LN bx benign. She has no nipple dischareg. The mass is growing.  The patient is a 83 year old female.   Medication History Tara Pummel, RN; 04/10/2017 7:30 AM) Medications Reconciled     Review of Systems (Tara Mast A. Tara Nack MD; 04/10/2017 10:06 AM) All other systems negative   Physical Exam (Tara Cox A. Tara Honda MD; 04/10/2017 10:07 AM)  General Mental Status-Alert. General Appearance-Consistent with stated age. Hydration-Well hydrated. Voice-Normal.  Head and Neck Head-normocephalic, atraumatic with no lesions or palpable masses. Trachea-midline. Thyroid Gland Characteristics - normal size and consistency.  Eye Eyeball - Bilateral-Extraocular movements intact. Sclera/Conjunctiva - Bilateral-No scleral icterus.  Chest and Lung Exam Chest and lung exam reveals -quiet, even and easy respiratory effort with no use of accessory muscles and on auscultation, normal breath sounds, no adventitious sounds and normal vocal resonance. Inspection Chest Wall - Normal. Back - normal.  Breast Note: right breast shows retracted nipple and a 4 cm palpable area central right breast . left breast normal  Cardiovascular Cardiovascular examination reveals -normal heart sounds, regular rate and rhythm with no murmurs and normal pedal pulses bilaterally. Note: a fib  Neurologic Neurologic evaluation  reveals -alert and oriented x 3 with no impairment of recent or remote memory. Mental Status-Normal.  Musculoskeletal Normal Exam - Left-Upper Extremity Strength Normal and Lower Extremity Strength Normal. Normal Exam - Right-Upper Extremity Strength Normal and Lower Extremity Strength Normal.  Lymphatic Head & Neck  General Head & Neck Lymphatics: Bilateral - Description - Normal. Axillary  General Axillary Region: Bilateral - Description - Normal. Tenderness - Non Tender.    Assessment & Plan (Tara Cox A. Tara Ishman MD; 04/10/2017 10:10 AM)  BREAST CANCER, RIGHT (C50.911) Impression: discussed options of lumpectomy vs mastectomy Mastectomy a better option given location and breast size She does not want reconstruction She may need a port as well WILL SET UP FOR RIGHT SIMPLE MASTECTOMY AND SLN MAPPING Discussed treatment options for breast cancer to include breast conservation vs mastectomy with reconstruction. Pt has decided on mastectomy. Risk include bleeding, infection, flap necrosis, pain, numbness, recurrence, hematoma, other surgery needs. Pt understands and agrees to proceed. Risk of sentinel lymph node mapping include bleeding, infection, lymphedema, shoulder pain. stiffness, dye allergy. cosmetic deformity , blood clots, death, need for more surgery. Pt agres to proceed. Pt requires port placement for chemotherapy. Risk include bleeding, infection, pneumothorax, hemothorax, mediastinal injury, nerve injury , blood vessel injury, strke, blood clots, death, migration. embolization and need for additional procedures. Pt agrees to proceed.  Current Plans Use of a central venous catheter for intravenous therapy was discussed. Technique of catheter placement using ultrasound and fluoroscopy guidance was discussed. Risks such as bleeding, infection, pneumothorax, catheter occlusion, reoperation, and other risks were discussed. I noted a good likelihood this will help address the  problem. Questions were answered. The patient expressed understanding & wishes to proceed. You are being scheduled for surgery- Our schedulers  will call you.   Cardiac clearance  You should hear from our office's scheduling department within 5 working days about the location, date, and time of surgery. We try to make accommodations for patient's preferences in scheduling surgery, but sometimes the OR schedule or the surgeon's schedule prevents [place] from making those accommodations.  If you have not heard from our office [phone]) in 5 working days, call the office and ask for your surgeon's nurse.  If you have other questions about your diagnosis, plan, or surgery, call the office and ask for your surgeon's nurse.  We discussed the staging and pathophysiology of breast cancer. We discussed all of the different options for treatment for breast cancer including surgery, chemotherapy, radiation therapy, Herceptin, and antiestrogen therapy. We discussed a sentinel lymph node biopsy as she does not appear to having lymph node involvement right now. We discussed the performance of that with injection of radioactive tracer and blue dye. We discussed that she would have an incision underneath her axillary hairline. We discussed that there is a bout a 10-20% chance of having a positive node with a sentinel lymph node biopsy and we will await the permanent pathology to make any other first further decisions in terms of her treatment. One of these options might be to return to the operating room to perform an axillary lymph node dissection. We discussed about a 1-2% risk lifetime of chronic shoulder pain as well as lymphedema associated with a sentinel lymph node biopsy. We discussed the options for treatment of the breast cancer which included lumpectomy versus a mastectomy. We discussed the performance of the lumpectomy with a wire placement. We discussed a 10-20% chance of a positive margin requiring  reexcision in the operating room. We also discussed that she may need radiation therapy or antiestrogen therapy or both if she undergoes lumpectomy. We discussed the mastectomy and the postoperative care for that as well. We discussed that there is no difference in her survival whether she undergoes lumpectomy with radiation therapy or antiestrogen therapy versus a mastectomy. There is a slight difference in the local recurrence rate being 3-5% with lumpectomy and about 1% with a mastectomy. We discussed the risks of operation including bleeding, infection, possible reoperation. She understands her further therapy will be based on what her stages at the time of her operation.  Pt Education - flb breast cancer surgery: discussed with patient and provided information. Pt Education - [facility] Mastectomy HCI Pt Education - ABC (After Breast Cancer) Class Info: discussed with patient and provided information.  MALIGNANT NEOPLASM INVOLVING BOTH NIPPLE AND AREOLA OF RIGHT BREAST IN FEMALE, ESTROGEN RECEPTOR POSITIVE (C50.021)

## 2017-04-10 NOTE — Progress Notes (Signed)
Radiation Oncology         [phone] ________________________________  Name: Tara Cox        MRN: [id number]  Date of Service: 04/10/2017 DOB: Feb 04, 1934  CC:[name], MD  Erroll Luna, MD     REFERRING PHYSICIAN: Erroll Luna, MD   DIAGNOSIS: The encounter diagnosis was Malignant neoplasm of central portion of right breast in female, estrogen receptor positive ([place]).   HISTORY OF PRESENT ILLNESS: Tara Cox is a 83 y.o. female seen in the multidisciplinary breast clinic for a new diagnosis of right breast cancer. The patient was noted to have calcifications in the right breast.  She was supposed to come back for recall but delayed this, and noted several months of right nipple inversion.  She ultimately presented for diagnostic imaging, and was found to have a subareolar mass measuring 2.8 x 2.3 x 1.5 cm, as well as 2 abnormal appearing lymph nodes.  She underwent biopsy on 03/29/2017 revealing a grade 2 invasive lobular carcinoma, ER/PR positive, HER-2 positive, Ki-67 of 25%, and her lymph node that was also sampled was negative.  She comes today to discuss options of treatment for her cancer.    PREVIOUS RADIATION THERAPY: No   PAST MEDICAL HISTORY:  Past Medical History:  Diagnosis Date  . A-fib ([place])   . Arthritis   . 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 DDD (degenerative disc disease), lumbar 12/08/2015   S/P discectomy   . Diaphragmatic hernia 12/08/2015  . Edema of lower extremity 06/29/09   Lower Venous Exam- normal. No evidence of thrombus or thrombophlebitis.  . Esophagitis 12/08/2015  . Frequent urination at night   . GERD (gastroesophageal reflux disease)   . Hemorrhoids   . History of calcium pyrophosphate deposition disease (CPPD) 12/08/2015  . History of miscarriage 12/08/2015   x3  . History of total right knee replacement 12/08/2015  . Hypertension 02/24/09   Echo-EF 62%; Myocardial Perfusion Study-Normal. No signifcant ischemia noted.   . IBS (irritable bowel syndrome)   . Migraine   . Osteoarthritis of both feet 12/08/2015  . Osteoarthritis of both hands 12/08/2015  . PONV (postoperative nausea and vomiting)   . Uterine fibroid 12/08/2015       PAST SURGICAL HISTORY: Past Surgical History:  Procedure Laterality Date  . ABDOMINAL HYSTERECTOMY  1974  . APPENDECTOMY    . BACK SURGERY  2001  . BUNIONECTOMY  1986  . CESAREAN SECTION  1971  . COLONOSCOPY    . COLONOSCOPY WITH PROPOFOL N/A 05/28/2014   Procedure: COLONOSCOPY WITH PROPOFOL;  Surgeon: Carol Ada, MD;  Location: [facility];  Service: Endoscopy;  Laterality: N/A;  . ESOPHAGOGASTRODUODENOSCOPY (EGD) WITH PROPOFOL N/A 05/28/2014   Procedure: ESOPHAGOGASTRODUODENOSCOPY (EGD) WITH PROPOFOL;  Surgeon: Carol Ada, MD;  Location: [facility];  Service: Endoscopy;  Laterality: N/A;  . EXPLORATORY LAPAROTOMY     open procedure  . EYE SURGERY Bilateral    cataract removal  . TOTAL KNEE ARTHROPLASTY Right 01/26/2013   DR Ronnie Derby  . TOTAL KNEE ARTHROPLASTY Right 01/26/2013   Procedure: TOTAL KNEE ARTHROPLASTY;  Surgeon: Vickey Huger, MD;  Location: [facility];  Service: Orthopedics;  Laterality: Right;  . TOTAL SHOULDER ARTHROPLASTY    . UTERINE FIBROID SURGERY  1967     FAMILY HISTORY:  Family History  Problem Relation Age of Onset  . Other Mother   . Varicose Veins Mother   . Emphysema Mother   . Heart disease Father  heart attack  . Diabetes Sister   . Other Sister        pacemaker  . Diabetes Sister   . Heart disease Sister      SOCIAL HISTORY:  reports that she quit smoking about 27 years ago. Her smoking use included cigarettes. She has a 20.00 pack-year smoking history. She has never used smokeless tobacco. She reports that she does not drink alcohol or use drugs. She is married and lives in [place]. She is accompanied by her son and grand daughter.   ALLERGIES: Oxycodone; Percocet [oxycodone-acetaminophen]; Soma [carisoprodol]; Ultram  [tramadol hcl]; and Hydrocodone   MEDICATIONS:  Current Outpatient Medications  Medication Sig Dispense Refill  . acetaminophen (TYLENOL) 325 MG tablet Take 2 tablets by mouth at bedtime as needed.    Marland Kitchen aspirin EC 81 MG tablet Take 81 mg by mouth daily.    Marland Kitchen atorvastatin (LIPITOR) 20 MG tablet Take by mouth daily at 6 PM.     . Calcium Carb-Cholecalciferol 600-800 MG-UNIT TABS Take 1 tablet by mouth 2 (two) times daily.     . cholecalciferol (VITAMIN D) 1000 UNITS tablet Take 3,000 Units by mouth daily.     . colchicine 0.6 MG tablet TAKE 1 TABLET DAILY BY  MOUTH. 90 tablet 1  . dextromethorphan (DELSYM) 30 MG/5ML liquid Take 30 mg by mouth daily as needed for cough.    . diphenhydrAMINE (BENADRYL) 25 mg capsule Take 25 mg by mouth every 6 (six) hours as needed for allergies.    . furosemide (LASIX) 20 MG tablet Take 20 mg by mouth daily.      Marland Kitchen gabapentin (NEURONTIN) 300 MG capsule Take 300 mg by mouth 2 (two) times daily.     Marland Kitchen HYDROcodone-acetaminophen (NORCO/VICODIN) 5-325 MG tablet     . hydrocortisone (ANUSOL-HC) 2.5 % rectal cream Place 1 application rectally 2 (two) times daily as needed for hemorrhoids or itching.    . hydroxychloroquine (PLAQUENIL) 200 MG tablet TAKE 1 TABLET BY MOUTH TWO  TIMES DAILY 180 tablet 0  . losartan (COZAAR) 100 MG tablet Take 100 mg by mouth daily.    Marland Kitchen losartan-hydrochlorothiazide (HYZAAR) 100-25 MG per tablet Take 1 tablet by mouth every morning.     . magnesium oxide (MAG-OX) 400 MG tablet Take 400 mg by mouth daily.     . metoprolol tartrate (LOPRESSOR) 25 MG tablet Take 12.5 mg by mouth 2 (two) times daily. Patient takes 1/2 in am and 1/2 in pm    . Multiple Vitamin (MULTIVITAMIN) tablet Take 1 tablet by mouth daily.    Marland Kitchen omeprazole (PRILOSEC) 40 MG capsule Take 1 capsule by mouth daily as needed.  12  . OVER THE COUNTER MEDICATION Take 1 tablet by mouth daily as needed (Pain). Mygrafew (health food store)    . potassium chloride SA  (K-DUR,KLOR-CON) 20 MEQ tablet Take 10 mEq by mouth daily.    Marland Kitchen triamcinolone cream (KENALOG) 0.1 % Apply 1 application topically 2 (two) times daily as needed (Vaginal Itching).    . vitamin C (ASCORBIC ACID) 500 MG tablet Take 500 mg by mouth daily.    Marland Kitchen warfarin (COUMADIN) 5 MG tablet Take 0.5-1 tablets (2.5-5 mg total) by mouth daily. Take 5 mg tablet daily or as directed by pharmacist from [facility] (Patient taking differently: Take 2.5-5 mg by mouth daily. Takes 31m every day except for Thursday takes 2.547m 40 tablet 1   No current facility-administered medications for this encounter.  REVIEW OF SYSTEMS: On review of systems, the patient reports that she is doing well overall. She denies any chest pain, shortness of breath, cough, fevers, chills, night sweats, unintended weight changes. She denies any bowel or bladder disturbances, and denies abdominal pain, nausea or vomiting. She denies any new musculoskeletal or joint aches or pains. A complete review of systems is obtained and is otherwise negative.     PHYSICAL EXAM:  Wt Readings from Last 3 Encounters:  10/11/16 246 lb (111.6 kg)  08/29/16 242 lb (109.8 kg)  05/11/16 246 lb (111.6 kg)   Temp Readings from Last 3 Encounters:  09/27/15 98.5 F (36.9 C)  05/28/14 97.8 F (36.6 C) (Oral)  01/28/13 98.4 F (36.9 C)   BP Readings from Last 3 Encounters:  10/11/16 118/60  08/29/16 124/64  05/11/16 128/64   Pulse Readings from Last 3 Encounters:  10/11/16 79  08/29/16 82  05/11/16 66     In general this is a well appearing African American female in no acute distress. She is alert and oriented x4 and appropriate throughout the examination. HEENT reveals that the patient is normocephalic, atraumatic. EOMs are intact. Skin is intact without any evidence of gross lesions. Cardiopulmonary assessment is negative for acute distress and she exhibits normal effort. Breast exam is deferred. Marland Kitchen  ECOG = 0  0 -  Asymptomatic (Fully active, able to carry on all predisease activities without restriction)  1 - Symptomatic but completely ambulatory (Restricted in physically strenuous activity but ambulatory and able to carry out work of a light or sedentary nature. For example, light housework, office work)  2 - Symptomatic, <50% in bed during the day (Ambulatory and capable of all self care but unable to carry out any work activities. Up and about more than 50% of waking hours)  3 - Symptomatic, >50% in bed, but not bedbound (Capable of only limited self-care, confined to bed or chair 50% or more of waking hours)  4 - Bedbound (Completely disabled. Cannot carry on any self-care. Totally confined to bed or chair)  5 - Death   Eustace Pen MM, Creech RH, Tormey DC, et al. [phone]). "Toxicity and response criteria of the [facility]". [place] Oncol. 5 (6): 649-55    LABORATORY DATA:  Lab Results  Component Value Date   WBC 5.8 10/11/2016   HGB 14.2 10/11/2016   HCT 43.5 10/11/2016   MCV 80.9 10/11/2016   PLT 170 10/11/2016   Lab Results  Component Value Date   NA 141 10/11/2016   K 4.1 10/11/2016   CL 103 10/11/2016   CO2 30 10/11/2016   Lab Results  Component Value Date   ALT 52 (H) 10/11/2016   AST 39 (H) 10/11/2016   ALKPHOS 87 12/12/2015   BILITOT 0.5 10/11/2016      RADIOGRAPHY: US Breast Ltd Uni Right Inc Axilla  Result Date: 03/26/2017 CLINICAL DATA:  Delayed follow-up for probably benign right breast calcifications. The patient states she has noticed right nipple inversion over course of the past several months. EXAM: DIGITAL DIAGNOSTIC BILATERAL MAMMOGRAM WITH CAD AND TOMO RIGHT BREAST ULTRASOUND COMPARISON:  Previous exam(s). ACR Breast Density Category b: There are scattered areas of fibroglandular density. FINDINGS: There is an irregular spiculated mass in the retroareolar right breast with associated calcifications with mass and calcifications together  measuring approximately 3.3 cm. There is associated retraction of the right nipple. No suspicious masses or calcifications are seen in the left breast. Mammographic images were  processed with CAD. Physical examination of the right breast reveals a firm fixed right retroareolar mass with associated flattening of the nipple. This is tender to palpation. Targeted ultrasound of the retroareolar right breast was performed demonstrating an irregular hypoechoic mass in the immediate retroareolar right breast measuring approximately 2.3 x 1.5 x 2.8 cm. This corresponds well with the mass seen in the right breast at mammography. There are 2 lymph nodes in the right axilla with borderline thickened cortices, 1 of which measures 1 x 0.9 x 0.6 cm with a cortex thickness of 0.4 mm. IMPRESSION: 1. Highly suspicious right retroareolar mass with associated calcifications, together the mass and calcifications measure up to 3.3 cm mammographically. 2.  Borderline right axillary lymph node. RECOMMENDATION: 1. Ultrasound-guided biopsy of the retroareolar right breast mass is recommended. 2. Ultrasound-guided biopsy of 1 of the lymph nodes in the low right axilla containing a borderline thickened cortex. I have discussed the findings and recommendations with the patient. Results were also provided in writing at the conclusion of the visit. If applicable, a reminder letter will be sent to the patient regarding the next appointment. BI-RADS CATEGORY  5: Highly suggestive of malignancy. Electronically Signed   By: Everlean Alstrom M.D.   On: 03/26/2017 09:48   Mm Diag Breast Tomo Bilateral  Result Date: 03/26/2017 CLINICAL DATA:  Delayed follow-up for probably benign right breast calcifications. The patient states she has noticed right nipple inversion over course of the past several months. EXAM: DIGITAL DIAGNOSTIC BILATERAL MAMMOGRAM WITH CAD AND TOMO RIGHT BREAST ULTRASOUND COMPARISON:  Previous exam(s). ACR Breast Density Category  b: There are scattered areas of fibroglandular density. FINDINGS: There is an irregular spiculated mass in the retroareolar right breast with associated calcifications with mass and calcifications together measuring approximately 3.3 cm. There is associated retraction of the right nipple. No suspicious masses or calcifications are seen in the left breast. Mammographic images were processed with CAD. Physical examination of the right breast reveals a firm fixed right retroareolar mass with associated flattening of the nipple. This is tender to palpation. Targeted ultrasound of the retroareolar right breast was performed demonstrating an irregular hypoechoic mass in the immediate retroareolar right breast measuring approximately 2.3 x 1.5 x 2.8 cm. This corresponds well with the mass seen in the right breast at mammography. There are 2 lymph nodes in the right axilla with borderline thickened cortices, 1 of which measures 1 x 0.9 x 0.6 cm with a cortex thickness of 0.4 mm. IMPRESSION: 1. Highly suspicious right retroareolar mass with associated calcifications, together the mass and calcifications measure up to 3.3 cm mammographically. 2.  Borderline right axillary lymph node. RECOMMENDATION: 1. Ultrasound-guided biopsy of the retroareolar right breast mass is recommended. 2. Ultrasound-guided biopsy of 1 of the lymph nodes in the low right axilla containing a borderline thickened cortex. I have discussed the findings and recommendations with the patient. Results were also provided in writing at the conclusion of the visit. If applicable, a reminder letter will be sent to the patient regarding the next appointment. BI-RADS CATEGORY  5: Highly suggestive of malignancy. Electronically Signed   By: Everlean Alstrom M.D.   On: 03/26/2017 09:48   [place] Axillary Node Core Biopsy Right  Result Date: 03/29/2017 CLINICAL DATA:  Right axillary lymph node with cortical thickening was recommended for biopsy given suspicious  findings in the retroareolar right breast. EXAM: [place] AXILLARY NODE CORE BIOPSY RIGHT COMPARISON:  Previous exam(s). FINDINGS: I met with the patient and we discussed  the procedure of ultrasound-guided biopsy, including benefits and alternatives. We discussed the high likelihood of a successful procedure. We discussed the risks of the procedure, including infection, bleeding, tissue injury, clip migration, and inadequate sampling. Informed written consent was given. The usual time-out protocol was performed immediately prior to the procedure. Using sterile technique and 1% Lidocaine as local anesthetic, under direct ultrasound visualization, a 14 gauge spring-loaded device was used to perform biopsy of a right axillary lymph node with cortical thickening using a lateral approach. At the conclusion of the procedure a HydroMARK tissue marker clip was deployed into the biopsy cavity. Follow up 2 view mammogram was performed and dictated separately. IMPRESSION: Ultrasound guided biopsy of right axillary lymph node. No apparent complications. Electronically Signed   By: Curlene Dolphin M.D.   On: 03/29/2017 16:31   Mm Clip Placement Right  Result Date: 03/29/2017 CLINICAL DATA:  Ultrasound-guided biopsies were performed of a suspicious retroareolar right breast mass and a right axillary lymph node. EXAM: DIAGNOSTIC RIGHT MAMMOGRAM POST ULTRASOUND BIOPSY COMPARISON:  Previous exam(s). FINDINGS: Mammographic images were obtained following ultrasound guided biopsy of a retroareolar right breast mass and a right axillary lymph node. A heart shaped biopsy clip is present in the retroareolar right breast mass. A HydroMARK biopsy clip is present within a right axillary lymph node. IMPRESSION: Satisfactory position of heart shaped and HydroMARK biopsy clips. Final Assessment: Post Procedure Mammograms for Marker Placement Electronically Signed   By: Curlene Dolphin M.D.   On: 03/29/2017 16:28   [place] Rt Breast Bx W Loc Dev 1st  Lesion Img Bx Spec US Guide  Addendum Date: 04/01/2017   ADDENDUM REPORT: 04/01/2017 12:56 ADDENDUM: Pathology revealed Grade II - INVASIVE MAMMARY CARCINOMA, of RIGHT breast, retroareolar. Pathology revealed BENIGN LYMPH NODE TISSUE of RIGHT axilla. This was found to be concordant by Dr. Curlene Dolphin. Pathology results were discussed with the patient by telephone. The patient reported doing well after the biopsy with tenderness at the site. Post biopsy instructions and care were reviewed and questions were answered. The patient was encouraged to call [facility] for any additional concerns. The patient was referred to [facility] on April 10, 2017. Pathology results reported by Roselind Messier, RN on 04/01/2017. Electronically Signed   By: Curlene Dolphin M.D.   On: 04/01/2017 12:56   Result Date: 04/01/2017 CLINICAL DATA:  Suspicious palpable retroareolar right breast mass. EXAM: ULTRASOUND GUIDED RIGHT BREAST CORE NEEDLE BIOPSY COMPARISON:  Previous exam(s). FINDINGS: I met with the patient and we discussed the procedure of ultrasound-guided biopsy, including benefits and alternatives. We discussed the high likelihood of a successful procedure. We discussed the risks of the procedure, including infection, bleeding, tissue injury, clip migration, and inadequate sampling. Informed written consent was given. The usual time-out protocol was performed immediately prior to the procedure. Lesion quadrant: The mass involves all 4 quadrants of the retroareolar right breast. Using sterile technique and 1% Lidocaine as local anesthetic, under direct ultrasound visualization, a 12 gauge spring-loaded device was used to perform biopsy of a hypoechoic central retroareolar right breast mass using a lateral approach. At the conclusion of the procedure a heart shaped tissue marker clip was deployed into the biopsy cavity. Follow up  2 view mammogram was performed and dictated separately. IMPRESSION: Ultrasound guided biopsy of the right breast. No apparent complications. Electronically Signed: By: Curlene Dolphin M.D. On: 03/29/2017 16:30  IMPRESSION/PLAN: 1. Stage IB, cT2N0M0, grade 2, triple positive invasive lobular carcinoma of the right breast. Dr. Lisbeth Renshaw discusses the pathology findings and reviews the nature of invasive breast disease. The consensus from the breast conference includes proceeding with an MRI to understand the extent of disease with mastectomy, and sentinel node biopsy.  Adjuvant therapy would be considered systemically with antiestrogen. Given her medical comorbidities and age,  Dr. Kathi Der does not anticipate systemic chemotherapy.  Based on the plan to undergo mastectomy, Dr. Lisbeth Renshaw does not anticipate a need for radiotherapy, however did outline scenarios when postmastectomy radiation would be considered.  We discussed the risks, benefits, short, and long term effects of radiotherapy. We look forward to hearing the pathology results which will be reviewed in breast conference after she proceeds surgically.   In a visit lasting 45 minutes, greater than 50% of the time was spent face to face discussing her case, and coordinating the patient's care.  The above documentation reflects my direct findings during this shared patient visit. Please see the separate note by Dr. Lisbeth Renshaw on this date for the remainder of the patient's plan of care.    Carola Rhine, PAC

## 2017-04-10 NOTE — Patient Instructions (Signed)

## 2017-04-11 ENCOUNTER — Ambulatory Visit: Payer: Medicare Other | Admitting: Rheumatology

## 2017-04-11 ENCOUNTER — Telehealth: Payer: Self-pay | Admitting: Oncology

## 2017-04-11 NOTE — Telephone Encounter (Signed)
Per 4/3 no los

## 2017-04-12 ENCOUNTER — Telehealth: Payer: Self-pay | Admitting: Cardiovascular Disease

## 2017-04-12 NOTE — Telephone Encounter (Signed)
New Message:       [facility] Pre-operative Risk Assessment    Request for surgical clearance:  1. What type of surgery is being performed? Mastectomy   2. When is this surgery scheduled? 04/18/17  3. What type of clearance is required (medical clearance vs. Pharmacy clearance to hold med vs. Both)? Medical  4. Are there any medications that need to be held prior to surgery and how long?aspirin EC 81 MG tablet, coumadin  5. Practice name and name of physician performing surgery? Dr. Burnard [facility]  6. What is your office phone and fax number? [phone] (F), [phone]   7. Anesthesia type (None, local, MAC, general) ? General   Maricela Bo Pulliam 04/12/2017, 8:57 AM  _________________________________________________________________   (provider comments below)

## 2017-04-12 NOTE — Telephone Encounter (Signed)
In addition to clearance info received verbally per operator note, received fax request. Patient's surgery is on 04/18/17. She will need to STOP coumadin & ASA 5 days prior to mastectomy & port placement. Clearance response can be addressed to Dr. Brantley Stage & Ventura Sellers CMA

## 2017-04-12 NOTE — Pre-Procedure Instructions (Signed)
Tara Cox  04/12/2017      [id number] - [place], [place] - [address] [place] [address] Phone: [phone] Fax: [phone]    Your procedure is scheduled on Thursday April 11.  Report to [facility] Admitting at 11:30 A.M.  Call this number if you have problems the morning of surgery:  [phone]   Remember:  Do not eat food or drink liquids after midnight.  Take these medicines the morning of surgery with A SIP OF WATER:   Metoprolol (lopressor) Omeprazole (prilosec) Acetaminophen (tylenol) if needed Colchicine Hydroxychloroquine (Plaquenil)  7 days prior to surgery STOP taking any Aleve, Naproxen, Ibuprofen, Motrin, Advil, Goody's, BC's, all herbal medications, fish oil, and all vitamins  **FOLLOW your surgeon's instructions on stopping Aspirin and Coumadin (warfarin). If no instructions were given, please call your surgeon's office**     Do not wear jewelry, make-up or nail polish.  Do not wear lotions, powders, or perfumes, or deodorant.  Do not shave 48 hours prior to surgery.  Men may shave face and neck.  Do not bring valuables to the hospital.  [facility] is not responsible for any belongings or valuables.  Contacts, dentures or bridgework may not be worn into surgery.  Leave your suitcase in the car.  After surgery it may be brought to your room.  For patients admitted to the hospital, discharge time will be determined by your treatment team.  Patients discharged the day of surgery will not be allowed to drive home.   Special instructions:    [facility]- Preparing For Surgery  Before surgery, you can play an important role. Because skin is not sterile, your skin needs to be as free of germs as possible. You can reduce the number of germs on your skin by washing with CHG (chlorahexidine gluconate) Soap before surgery.  CHG is an antiseptic cleaner which kills germs and bonds with the  skin to continue killing germs even after washing.  Please do not use if you have an allergy to CHG or antibacterial soaps. If your skin becomes reddened/irritated stop using the CHG.  Do not shave (including legs and underarms) for at least 48 hours prior to first CHG shower. It is OK to shave your face.  Please follow these instructions carefully.   1. Shower the NIGHT BEFORE SURGERY and the MORNING OF SURGERY with CHG.   2. If you chose to wash your hair, wash your hair first as usual with your normal shampoo.  3. After you shampoo, rinse your hair and body thoroughly to remove the shampoo.  4. Use CHG as you would any other liquid soap. You can apply CHG directly to the skin and wash gently with a scrungie or a clean washcloth.   5. Apply the CHG Soap to your body ONLY FROM THE NECK DOWN.  Do not use on open wounds or open sores. Avoid contact with your eyes, ears, mouth and genitals (private parts). Wash Face and genitals (private parts)  with your normal soap.  6. Wash thoroughly, paying special attention to the area where your surgery will be performed.  7. Thoroughly rinse your body with warm water from the neck down.  8. DO NOT shower/wash with your normal soap after using and rinsing off the CHG Soap.  9. Pat yourself dry with a CLEAN TOWEL.  10. Wear CLEAN PAJAMAS to bed the night before surgery, wear comfortable  clothes the morning of surgery  11. Place CLEAN SHEETS on your bed the night of your first shower and DO NOT SLEEP WITH PETS.    Day of Surgery: Do not apply any deodorants/lotions. Please wear clean clothes to the hospital/surgery center.      Please read over the following fact sheets that you were given. Coughing and Deep Breathing and Surgical Site Infection Prevention

## 2017-04-15 ENCOUNTER — Other Ambulatory Visit: Payer: Self-pay | Admitting: Oncology

## 2017-04-15 ENCOUNTER — Inpatient Hospital Stay (HOSPITAL_COMMUNITY)
Admission: RE | Admit: 2017-04-15 | Discharge: 2017-04-15 | Disposition: A | Discharge status: Home / Self Care | Payer: Medicare Other | Source: Ambulatory Visit

## 2017-04-15 NOTE — Progress Notes (Addendum)
PCP: Kathryne Eriksson, MD  Cardiologist: Quay Burow, MD  EKG: 08/29/16 in [facility]  Stress test: 12/11/12  ECHO: 02/2009 in [facility] and scheduled 04/16/17  Cardiac Cath:  Chest x-ray:  Per note from Sheral Apley, RN-pt to begin holding aspirin and coumadin 5 days prior to surgery 04/13/17

## 2017-04-15 NOTE — Progress Notes (Signed)
Tara Cox            04/12/2017                          [id number] - [place], [place] - [address] [name] [address] Phone: [phone] Fax: [phone]              Your procedure is scheduled on Thursday April 11.            Report to [facility] Admitting at 11:30 A.M.            Call this number if you have problems the morning of surgery:            [phone]             Remember:            Do not eat food or drink liquids after midnight.            Take these medicines the morning of surgery with A SIP OF WATER:   Metoprolol (lopressor) Omeprazole (prilosec) Acetaminophen (tylenol) if needed Colchicine Hydroxychloroquine (Plaquenil)  Please complete your PRE-SURGERY ENSURE that was given to before you leave your house the morning of surgery.  Please, if able, drink it in one setting. DO NOT SIP.  7 days prior to surgery STOP taking any Aleve, Naproxen, Ibuprofen, Motrin, Advil, Goody's, BC's, all herbal medications, fish oil, and all vitamins  **FOLLOW your surgeon's instructions on stopping Aspirin and Coumadin (warfarin). If no instructions were given, please call your surgeon's office**               Do not wear jewelry, make-up or nail polish.            Do not wear lotions, powders, or perfumes, or deodorant.            Do not shave 48 hours prior to surgery.  Men may shave face and neck.            Do not bring valuables to the hospital.            [facility] is not responsible for any belongings or valuables.  Contacts, dentures or bridgework may not be worn into surgery.  Leave your suitcase in the car.  After surgery it may be brought to your room.  For patients admitted to the hospital, discharge time will be determined by your treatment team.  Patients discharged the day of surgery will not be allowed to drive home.   Special instructions:    [facility]- Preparing  For Surgery  Before surgery, you can play an important role. Because skin is not sterile, your skin needs to be as free of germs as possible. You can reduce the number of germs on your skin by washing with CHG (chlorahexidine gluconate) Soap before surgery.  CHG is an antiseptic cleaner which kills germs and bonds with the skin to continue killing germs even after washing.  Please do not use if you have an allergy to CHG or antibacterial soaps. If your skin becomes reddened/irritated stop using the CHG.  Do not shave (including legs and underarms) for at least 48 hours prior to first CHG shower. It is OK to shave your face.  Please follow these instructions carefully.  1. Shower the NIGHT BEFORE SURGERY and the MORNING OF SURGERY with CHG.   2. If you chose to wash your hair, wash your hair first as usual with your normal shampoo.  3. After you shampoo, rinse your hair and body thoroughly to remove the shampoo.  4. Use CHG as you would any other liquid soap. You can apply CHG directly to the skin and wash gently with a scrungie or a clean washcloth.   5. Apply the CHG Soap to your body ONLY FROM THE NECK DOWN.  Do not use on open wounds or open sores. Avoid contact with your eyes, ears, mouth and genitals (private parts). Wash Face and genitals (private parts)  with your normal soap.  6. Wash thoroughly, paying special attention to the area where your surgery will be performed.  7. Thoroughly rinse your body with warm water from the neck down.  8. DO NOT shower/wash with your normal soap after using and rinsing off the CHG Soap.  9. Pat yourself dry with a CLEAN TOWEL.  10. Wear CLEAN PAJAMAS to bed the night before surgery, wear comfortable clothes the morning of surgery  11. Place CLEAN SHEETS on your bed the night of your first shower and DO NOT SLEEP WITH  PETS.    Day of Surgery: Do not apply any deodorants/lotions. Please wear clean clothes to the hospital/surgery center.      Please read over the following fact sheets that you were given. Coughing and Deep Breathing and Surgical Site Infection Prevention

## 2017-04-15 NOTE — Telephone Encounter (Signed)
New message  Pt verbalized that she is calling for RN  To go over the surgical clearance approval  And if she can hold her medications

## 2017-04-15 NOTE — Telephone Encounter (Signed)
Pt has appt w/ Dr Tonye Becket

## 2017-04-15 NOTE — Telephone Encounter (Signed)
   Primary Cardiologist:No primary care provider on file.  Chart reviewed as part of pre-operative protocol coverage. Because of Roanna C Elrod's past medical history and time since last visit, he/she will require a follow-up visit in order to better assess preoperative cardiovascular risk. She has not been seen in one year. Pharmacy has already addressed anticoagulation.   Pre-op covering staff: - Please schedule appointment and call patient to inform them. - Please contact requesting surgeon's office via preferred method (i.e, phone, fax) to inform them of need for appointment prior to surgery.  Jory Sims, NP  04/15/2017, 4:07 PM

## 2017-04-16 ENCOUNTER — Telehealth: Payer: Self-pay | Admitting: *Deleted

## 2017-04-16 ENCOUNTER — Encounter (HOSPITAL_COMMUNITY): Payer: Self-pay | Admitting: Cardiology

## 2017-04-16 ENCOUNTER — Ambulatory Visit (HOSPITAL_COMMUNITY)
Admission: RE | Admit: 2017-04-16 | Discharge: 2017-04-16 | Disposition: A | Discharge status: Home / Self Care | Payer: Medicare Other | Source: Ambulatory Visit | Attending: Family Medicine | Admitting: Family Medicine

## 2017-04-16 ENCOUNTER — Other Ambulatory Visit: Payer: Self-pay

## 2017-04-16 ENCOUNTER — Telehealth: Payer: Self-pay | Admitting: Oncology

## 2017-04-16 ENCOUNTER — Ambulatory Visit (INDEPENDENT_AMBULATORY_CARE_PROVIDER_SITE_OTHER)
Admission: RE | Admit: 2017-04-16 | Discharge: 2017-04-16 | Disposition: A | Discharge status: Home / Self Care | Payer: Medicare Other | Source: Ambulatory Visit | Attending: Cardiology | Admitting: Cardiology

## 2017-04-16 VITALS — BP 157/72 | HR 80 | Wt 239.5 lb

## 2017-04-16 DIAGNOSIS — I5030 Unspecified diastolic (congestive) heart failure: Secondary | ICD-10-CM

## 2017-04-16 DIAGNOSIS — K589 Irritable bowel syndrome without diarrhea: Secondary | ICD-10-CM | POA: Diagnosis not present

## 2017-04-16 DIAGNOSIS — Z87891 Personal history of nicotine dependence: Secondary | ICD-10-CM | POA: Diagnosis not present

## 2017-04-16 DIAGNOSIS — M1120 Other chondrocalcinosis, unspecified site: Secondary | ICD-10-CM | POA: Insufficient documentation

## 2017-04-16 DIAGNOSIS — Z79899 Other long term (current) drug therapy: Secondary | ICD-10-CM | POA: Diagnosis not present

## 2017-04-16 DIAGNOSIS — Z170 Estrogen receptor positive status [ER+]: Secondary | ICD-10-CM

## 2017-04-16 DIAGNOSIS — I10 Essential (primary) hypertension: Secondary | ICD-10-CM | POA: Diagnosis not present

## 2017-04-16 DIAGNOSIS — I480 Paroxysmal atrial fibrillation: Secondary | ICD-10-CM | POA: Diagnosis not present

## 2017-04-16 DIAGNOSIS — Z7901 Long term (current) use of anticoagulants: Secondary | ICD-10-CM | POA: Insufficient documentation

## 2017-04-16 DIAGNOSIS — C50111 Malignant neoplasm of central portion of right female breast: Secondary | ICD-10-CM | POA: Insufficient documentation

## 2017-04-16 DIAGNOSIS — I119 Hypertensive heart disease without heart failure: Secondary | ICD-10-CM | POA: Insufficient documentation

## 2017-04-16 DIAGNOSIS — E785 Hyperlipidemia, unspecified: Secondary | ICD-10-CM | POA: Diagnosis not present

## 2017-04-16 DIAGNOSIS — K219 Gastro-esophageal reflux disease without esophagitis: Secondary | ICD-10-CM | POA: Insufficient documentation

## 2017-04-16 DIAGNOSIS — M1990 Unspecified osteoarthritis, unspecified site: Secondary | ICD-10-CM | POA: Diagnosis not present

## 2017-04-16 NOTE — Progress Notes (Signed)
  Echocardiogram 2D Echocardiogram has been performed.  Tara Cox 04/16/2017, 12:05 PM

## 2017-04-16 NOTE — Telephone Encounter (Signed)
Tried to call regarding 5/17 sent out new schedule

## 2017-04-16 NOTE — Patient Instructions (Signed)
Stop Asprin  Your physician has requested that you have an echocardiogram. Echocardiography is a painless test that uses sound waves to create images of your heart. It provides your doctor with information about the size and shape of your heart and how well your heart's chambers and valves are working. This procedure takes approximately one hour. There are no restrictions for this procedure.  Your physician recommends that you schedule a follow-up appointment in: August, 2019 with Dr. Aundra Dubin  And  an a echocardiogram Please Call an Schedule Appointment

## 2017-04-16 NOTE — Telephone Encounter (Signed)
Spoke to pt regarding post op appt with Dr. Jana Hakim on 5/17. Confirmed appt. Denies further needs at this time. Pt request information on when to stop coumadin prior to sx. Msg sent to Dr. Josetta Huddle office.

## 2017-04-17 NOTE — Progress Notes (Signed)
Oncology: Dr. Jana Hakim  82 with history of pseudogout, paroxysmal atrial fibrillation, osteoarthritis, and breast cancer was referred by Dr. Jana Hakim for cardio-oncology evaluation.    She was diagnosed in 3/19 with right breast cancer, ER+/PR+/HER2+.  She will have right mastectomy in 4/19 with Herceptin x 1 year to start afterwards.  She will not get radiation.   She has not had palpitations concerning for atrial fibrillation and is in NSR today. No chest pain. No significant exertional dyspnea. BP is high today but under a lot of stress with her family and with her cancer diagnosis, not usually this high.   ECG: NSR, normal  Labs (10/18): creatinine 0.91  PMH: 1. CPPD/pseudogout 2. GERD 3. Degenerative disc disease s/p discectomy 4. Right TKR 5. HTN 6. IBS 7. Hyperlipidemia 8. Atrial fibrillation: Paroxysmal, she is on warfarin.  9. Cardiolite 12/14 was normal. 10. Breast cancer: Diagnosed 3/19 with right breast cancer, ER+/PR+/HER2+.  She will have right mastectomy in 4/19 with Herceptin x 1 year to start afterwards.  She will not get radiation.  - Echo (4/19): EF 60-65%, mild focal basal septal hypertrophy, GLS -18.1%, normal RV size and systolic function.   Social History   Socioeconomic History  . Marital status: Married    Spouse name: Not on file  . Number of children: Not on file  . Years of education: Not on file  . Highest education level: Not on file  Occupational History  . Not on file  Social Needs  . Financial resource strain: Not on file  . Food insecurity:    Worry: Not on file    Inability: Not on file  . Transportation needs:    Medical: Not on file    Non-medical: Not on file  Tobacco Use  . Smoking status: Former Smoker    Packs/day: 1.00    Years: 20.00    Pack years: 20.00    Types: Cigarettes    Last attempt to quit: 02/19/1990    Years since quitting: 27.1  . Smokeless tobacco: Never Used  . Tobacco comment: ' i QUIT SMOKING MANY YEARS AGO  "  Substance and Sexual Activity  . Alcohol use: No  . Drug use: No  . Sexual activity: Not on file  Lifestyle  . Physical activity:    Days per week: Not on file    Minutes per session: Not on file  . Stress: Not on file  Relationships  . Social connections:    Talks on phone: Not on file    Gets together: Not on file    Attends religious service: Not on file    Active member of club or organization: Not on file    Attends meetings of clubs or organizations: Not on file    Relationship status: Not on file  . Intimate partner violence:    Fear of current or ex partner: Not on file    Emotionally abused: Not on file    Physically abused: Not on file    Forced sexual activity: Not on file  Other Topics Concern  . Not on file  Social History Narrative  . Not on file   Family History  Problem Relation Age of Onset  . Other Mother   . Varicose Veins Mother   . Emphysema Mother   . Heart disease Father        heart attack  . Diabetes Sister   . Other Sister        pacemaker  . Diabetes  Sister   . Heart disease Sister    ROS: All systems reviewed and negative except as per HPI.   Current Outpatient Medications  Medication Sig Dispense Refill  . acetaminophen (TYLENOL) 650 MG CR tablet Take 650 mg by mouth every 8 (eight) hours as needed for pain.    Marland Kitchen amoxicillin (AMOXIL) 500 MG capsule Take 2,000 mg by mouth See admin instructions. Take 4 capsules (2000 mg) by mouth 1 hour prior to dental procedures.    . Ascorbic Acid (VITAMIN C WITH ROSE HIPS) 500 MG tablet Take 500 mg by mouth daily.    Marland Kitchen atorvastatin (LIPITOR) 20 MG tablet Take 20 mg by mouth at bedtime.     . benzonatate (TESSALON) 200 MG capsule Take 200 mg by mouth 3 (three) times daily as needed for cough.    . Calcium Carbonate-Vit D-Min (CALCIUM 600+D3 PLUS MINERALS) 600-800 MG-UNIT TABS Take 1 tablet by mouth at bedtime.    . Cholecalciferol (VITAMIN D) 2000 units CAPS Take 2,000 Units by mouth daily.    .  colchicine 0.6 MG tablet TAKE 1 TABLET DAILY BY  MOUTH. 90 tablet 1  . dextromethorphan (DELSYM) 30 MG/5ML liquid Take 30 mg by mouth daily as needed for cough.    . diphenhydrAMINE (BENADRYL) 25 mg capsule Take 25 mg by mouth every 6 (six) hours as needed for allergies.    . furosemide (LASIX) 20 MG tablet Take 20 mg by mouth daily.      Marland Kitchen gabapentin (NEURONTIN) 300 MG capsule Take 300 mg by mouth at bedtime.     . hydrocortisone (ANUSOL-HC) 2.5 % rectal cream Place 1 application rectally 2 (two) times daily as needed for hemorrhoids or itching.    . hydroxychloroquine (PLAQUENIL) 200 MG tablet TAKE 1 TABLET BY MOUTH TWO  TIMES DAILY (Patient taking differently: TAKE 1 TABLET BY MOUTH BY MOUTH DAILY WITH LUNCH.) 180 tablet 0  . losartan-hydrochlorothiazide (HYZAAR) 100-25 MG per tablet Take 1 tablet by mouth daily. IN THE MORNING.    . magnesium oxide (MAG-OX) 400 MG tablet Take 400 mg by mouth daily.     . metoprolol tartrate (LOPRESSOR) 25 MG tablet Take 25 mg by mouth daily with lunch.     . Multiple Vitamin (MULTIVITAMIN) tablet Take 1 tablet by mouth daily. CENTRUM    . NONFORMULARY OR COMPOUNDED ITEM Apply 1-2 g topically 4 (four) times daily as needed for pain. BACLOFEN-DOXEPIN-GABAPENTIN-TOPIRAMATE-PENTOXIFYLLINE  2  . omeprazole (PRILOSEC) 40 MG capsule Take 40 mg by mouth daily as needed (FOR ACID REFLUX/INDIGESTION).   12  . OVER THE COUNTER MEDICATION Take 600 mcg by mouth daily as needed (AT ONSET OF MIGRAINE). MYGRAFEW (TANACETUM PARTHENIUM)    . potassium chloride SA (K-DUR,KLOR-CON) 20 MEQ tablet Take 10 mEq by mouth daily.    Marland Kitchen triamcinolone cream (KENALOG) 0.1 % Apply 1 application topically 2 (two) times daily as needed (Vaginal Itching).    . warfarin (COUMADIN) 5 MG tablet Take 0.5-1 tablets (2.5-5 mg total) by mouth daily. Take 5 mg tablet daily or as directed by pharmacist from [facility] (Patient taking differently: Take 5 mg by mouth at bedtime. ) 40 tablet 1   No  current facility-administered medications for this encounter.    BP (!) 157/72   Pulse 80   Wt 239 lb 8 oz (108.6 kg)   SpO2 99%   BMI 37.51 kg/m  General: NAD Neck: No JVD, no thyromegaly or thyroid nodule.  Lungs: Clear to auscultation bilaterally with  normal respiratory effort. CV: Nondisplaced PMI.  Heart regular S1/S2, no S3/S4, no murmur.  No peripheral edema.  No carotid bruit.  Normal pedal pulses.  Abdomen: Soft, nontender, no hepatosplenomegaly, no distention.  Skin: Intact without lesions or rashes.  Neurologic: Alert and oriented x 3.  Psych: Normal affect. Extremities: No clubbing or cyanosis.  HEENT: Normal.   Assessment/Plan: 1. Breast cancer: She will be getting Herceptin as part of her therapy.  We discussed the cardiac risk of Herceptin and the rationale behind echo screening.  I reviewed today's echo, EF and strain parameters are normal.   - Followup in 3 month after starting Herceptin with echocardiogram.  2. Atrial fibrillation: Paroxysmal.  She is in NSR today.   - Continue warfarin but can stop ASA 81 daily.  3. HTN: BP high today but under a lot of stress. Will follow without changing meds for now.   Loralie Champagne 04/17/2017

## 2017-04-18 ENCOUNTER — Telehealth: Payer: Self-pay | Admitting: Oncology

## 2017-04-18 ENCOUNTER — Other Ambulatory Visit (HOSPITAL_COMMUNITY): Payer: Medicare Other

## 2017-04-18 NOTE — Telephone Encounter (Signed)
Spoke with patient regarding appt per 4/11 sch msg

## 2017-04-23 ENCOUNTER — Telehealth: Payer: Self-pay | Admitting: Cardiovascular Disease

## 2017-04-23 NOTE — Telephone Encounter (Addendum)
F/U Call:  [facility] and would like to know update on surgical clearance.  1. What type of surgery is being performed? Mastectomy   2. When is this surgery scheduled? 05/07/17  3. What type of clearance is required (medical clearance vs. Pharmacy clearance to hold med vs. Both)? Medical  4. Are there any medications that need to be held prior to surgery and how long?aspirin EC 81 MG tablet, coumadin  5. Practice name and name of physician performing surgery? Dr. Burnard [facility]  6. What is your office phone and fax number? [phone] (F), [phone]   7. Anesthesia type (None, local, MAC, general) ? General

## 2017-04-24 NOTE — Telephone Encounter (Signed)
Patient seen in office at [facility] with Dr. Aundra Dubin 04/16/17. Will not need another OV for clearance. Will forward to Dr. Aundra Dubin to verify Clearance

## 2017-04-24 NOTE — Telephone Encounter (Signed)
   Primary Cardiologist: Gwenlyn Found Chart reviewed as part of pre-operative protocol coverage. Because of Marianna C Island's past medical history and time since last visit, he/she will require a follow-up visit in order to better assess preoperative cardiovascular risk.  Pre-op covering staff: - Please schedule appointment and call patient to inform them. - Please contact requesting surgeon's office via preferred method (i.e, phone, fax) to inform them of need for appointment prior to surgery.   Noted in last OV note that the patient's coumadin is managed by PCP.   Truitt Merle, NP  04/24/2017, 1:38 PM

## 2017-04-24 NOTE — Telephone Encounter (Signed)
OK for surgery. Can hold warfarin without bridge.

## 2017-04-26 NOTE — Pre-Procedure Instructions (Addendum)
WINDSOR ZIRKELBACH  04/26/2017      [id number] - [place], [place] - [address] [place] [address] Phone: [phone] Fax: [phone]    Your procedure is scheduled on Tuesday April 30.  Report to [facility] Admitting at 1:00 P.M.  Call this number if you have problems the morning of surgery:  [phone]   Remember:  Do not eat food after Midnight. You may drink CLEAR liquids until 12PM the day of surgery.  **DRINK Ensure pre-surgery drink prior to 12PM the day of surgery**   Take these medicines the morning of surgery with A SIP OF WATER:   Metoprolol (lopressor) Omeprazole (prilosec) if needed Acetaminophen (tylenol) if needed Colchicine Hydroxychloroquine (Plaquenil)  7 days prior to surgery STOP taking any Aspirin(unless otherwise instructed by your surgeon), Aleve, Naproxen, Ibuprofen, Motrin, Advil, Goody's, BC's, all herbal medications, fish oil, and all vitamins  **Follow your surgeon's instructions on stopping Coumadin (Warfarin). If no instructions were given, please call your surgeon's office**    Do not wear jewelry, make-up or nail polish.  Do not wear lotions, powders, or perfumes, or deodorant.  Do not shave 48 hours prior to surgery.  Men may shave face and neck.  Do not bring valuables to the hospital.  [facility] is not responsible for any belongings or valuables.  Contacts, dentures or bridgework may not be worn into surgery.  Leave your suitcase in the car.  After surgery it may be brought to your room.  For patients admitted to the hospital, discharge time will be determined by your treatment team.  Patients discharged the day of surgery will not be allowed to drive home.   Special instructions:    [facility]- Preparing For Surgery  Before surgery, you can play an important role. Because skin is not sterile, your skin needs to be as free of germs as possible. You can reduce the  number of germs on your skin by washing with CHG (chlorahexidine gluconate) Soap before surgery.  CHG is an antiseptic cleaner which kills germs and bonds with the skin to continue killing germs even after washing.  Please do not use if you have an allergy to CHG or antibacterial soaps. If your skin becomes reddened/irritated stop using the CHG.  Do not shave (including legs and underarms) for at least 48 hours prior to first CHG shower. It is OK to shave your face.  Please follow these instructions carefully.   1. Shower the NIGHT BEFORE SURGERY and the MORNING OF SURGERY with CHG.   2. If you chose to wash your hair, wash your hair first as usual with your normal shampoo.  3. After you shampoo, rinse your hair and body thoroughly to remove the shampoo.  4. Use CHG as you would any other liquid soap. You can apply CHG directly to the skin and wash gently with a scrungie or a clean washcloth.   5. Apply the CHG Soap to your body ONLY FROM THE NECK DOWN.  Do not use on open wounds or open sores. Avoid contact with your eyes, ears, mouth and genitals (private parts). Wash Face and genitals (private parts)  with your normal soap.  6. Wash thoroughly, paying special attention to the area where your surgery will be performed.  7. Thoroughly rinse your body with warm water from the neck down.  8. DO NOT shower/wash with your normal soap after using and rinsing  off the CHG Soap.  9. Pat yourself dry with a CLEAN TOWEL.  10. Wear CLEAN PAJAMAS to bed the night before surgery, wear comfortable clothes the morning of surgery  11. Place CLEAN SHEETS on your bed the night of your first shower and DO NOT SLEEP WITH PETS.    Day of Surgery: Do not apply any deodorants/lotions. Please wear clean clothes to the hospital/surgery center.      Please read over the following fact sheets that you were given. Coughing and Deep Breathing and Surgical Site Infection Prevention

## 2017-04-29 ENCOUNTER — Encounter (HOSPITAL_COMMUNITY)
Admission: RE | Admit: 2017-04-29 | Discharge: 2017-04-29 | Disposition: A | Discharge status: Home / Self Care | Payer: Medicare Other | Source: Ambulatory Visit | Attending: Surgery | Admitting: Surgery

## 2017-04-29 ENCOUNTER — Other Ambulatory Visit: Payer: Self-pay

## 2017-04-29 ENCOUNTER — Encounter (HOSPITAL_COMMUNITY): Payer: Self-pay

## 2017-04-29 DIAGNOSIS — Z96651 Presence of right artificial knee joint: Secondary | ICD-10-CM | POA: Diagnosis not present

## 2017-04-29 DIAGNOSIS — Z7901 Long term (current) use of anticoagulants: Secondary | ICD-10-CM | POA: Insufficient documentation

## 2017-04-29 DIAGNOSIS — I10 Essential (primary) hypertension: Secondary | ICD-10-CM | POA: Diagnosis not present

## 2017-04-29 DIAGNOSIS — K219 Gastro-esophageal reflux disease without esophagitis: Secondary | ICD-10-CM | POA: Insufficient documentation

## 2017-04-29 DIAGNOSIS — C50911 Malignant neoplasm of unspecified site of right female breast: Secondary | ICD-10-CM | POA: Diagnosis not present

## 2017-04-29 DIAGNOSIS — I4891 Unspecified atrial fibrillation: Secondary | ICD-10-CM | POA: Insufficient documentation

## 2017-04-29 DIAGNOSIS — Z90710 Acquired absence of both cervix and uterus: Secondary | ICD-10-CM | POA: Diagnosis not present

## 2017-04-29 DIAGNOSIS — Z01812 Encounter for preprocedural laboratory examination: Secondary | ICD-10-CM | POA: Diagnosis not present

## 2017-04-29 DIAGNOSIS — Z79899 Other long term (current) drug therapy: Secondary | ICD-10-CM | POA: Insufficient documentation

## 2017-04-29 HISTORY — DX: Cardiac arrhythmia, unspecified: I49.9

## 2017-04-29 LAB — CBC
HCT: 46.4 % — ABNORMAL HIGH (ref 36.0–46.0)
Hemoglobin: 15 g/dL (ref 12.0–15.0)
MCH: 27.1 pg (ref 26.0–34.0)
MCHC: 32.3 g/dL (ref 30.0–36.0)
MCV: 83.8 fL (ref 78.0–100.0)
PLATELETS: 142 10*3/uL — AB (ref 150–400)
RBC: 5.54 MIL/uL — ABNORMAL HIGH (ref 3.87–5.11)
RDW: 14.7 % (ref 11.5–15.5)
WBC: 5.2 10*3/uL (ref 4.0–10.5)

## 2017-04-29 NOTE — Progress Notes (Addendum)
Cardiologist Dr. Gwenlyn Found     Hx Atrial Fib  Pt. Instructed to stop coumadin 5 days prior to surgery.  Pt. States that Dr. Aundra Dubin told her to stop ASA  completely   BMET needs to be recollected DOS per lab quantity insufficient  To run.  PT/INR DOS  Pt. To stop coumadin 5 days prior to surgery

## 2017-04-30 NOTE — Progress Notes (Signed)
Anesthesia Chart Review:   Case:  [id number] Date/Time:  05/07/17 1445   Procedures:      RIGHT SIMPLE MASTECTOMY WITH AXILLARY SENTINEL NODE BIOPSY (Right Breast)     INSERTION PORT-A-CATH (N/A )   Anesthesia type:  General   Pre-op diagnosis:  RIGHT BREAST CANCER   Location:  [facility] OR ROOM 02 / [place] [facility]   Surgeon:  Erroll Luna, MD      DISCUSSION: Pt is an 83 year old female with a hx of atrial fibrillation. Will stop coumadin 5 days before surgery. Has cardiac clearance for surgery.    VS: BP (!) 186/81 Comment: notified Doctor, hospital  Pulse 81   Temp 36.6 C   Resp 18   Ht 5\' 7"  (1.702 m)   Wt 241 lb 8 oz (109.5 kg)   SpO2 98%   BMI 37.82 kg/m    PROVIDERS: [name], MD Patient Care Team: Magrinat, Virgie Dad, MD as Consulting Physician (Oncology) Erroll Luna, MD as Consulting Physician (General Surgery) Kyung Rudd, MD as Consulting Physician (Radiation Oncology) Vickey Huger, MD as Consulting Physician (Orthopedic Surgery) Bo Merino, MD as Consulting Physician (Rheumatology) Lorretta Harp, MD as Consulting Physician (Cardiology) - Also now seeing cardiologist Loralie Champagne, MD in cardio-oncology clinic.  He cleared pt for surgery 04/29/17. Last office visit 04/16/17.    LABS: Labs reviewed: Acceptable for surgery.  - PT/INR and BMET will be obtained day of surgery  (all labs ordered are listed, but only abnormal results are displayed)  Labs Reviewed  CBC - Abnormal; Notable for the following components:      Result Value   RBC 5.54 (*)    HCT 46.4 (*)    Platelets 142 (*)    All other components within normal limits    EKG 04/16/17: NSR   CV:  Echo 04/16/17:  - Left ventricle: The cavity size was normal. There was mild focal basal hypertrophy of the septum. Systolic function was normal. The estimated ejection fraction was in the range of 60% to 65%. Wall motion was normal; there were no regional wall motion abnormalities. GLS -18.1%. Doppler  parameters are consistent with abnormal left ventricular relaxation (grade 1 diastolic dysfunction). - Aortic valve: There was no stenosis. - Mitral valve: There was trivial regurgitation. - Right ventricle: The cavity size was normal. Systolic function was normal. - Tricuspid valve: Peak RV-RA gradient (S): 22 mm Hg. - Pulmonary arteries: PA peak pressure: 25 mm Hg (S). - Inferior vena cava: The vessel was normal in size. The respirophasic diameter changes were in the normal range (>= 50%), consistent with normal central venous pressure.  Nuclear stress test 12/11/12:  - Normal stress nuclear study. - LV Wall Motion:  NL LV Function; NL Wall Motion    Past Medical History:  Diagnosis Date  . A-fib ([place])   . Arthritis   . 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 DDD (degenerative disc disease), lumbar 12/08/2015   S/P discectomy   . Diaphragmatic hernia 12/08/2015  . Dysrhythmia    A-Fib  . Edema of lower extremity 06/29/09   Lower Venous Exam- normal. No evidence of thrombus or thrombophlebitis.  . Esophagitis 12/08/2015  . Frequent urination at night   . GERD (gastroesophageal reflux disease)   . Hemorrhoids   . History of calcium pyrophosphate deposition disease (CPPD) 12/08/2015  . History of miscarriage 12/08/2015   x3  . History of total right knee replacement 12/08/2015  . Hypertension 02/24/09   Echo-EF 62%; Myocardial  Perfusion Study-Normal. No signifcant ischemia noted.  . IBS (irritable bowel syndrome)   . Migraine   . Osteoarthritis of both feet 12/08/2015  . Osteoarthritis of both hands 12/08/2015  . PONV (postoperative nausea and vomiting)   . Uterine fibroid 12/08/2015    Past Surgical History:  Procedure Laterality Date  . ABDOMINAL HYSTERECTOMY  1974  . APPENDECTOMY    . BACK SURGERY  2001  . BUNIONECTOMY  1986  . CESAREAN SECTION  1971  . COLONOSCOPY    . COLONOSCOPY WITH PROPOFOL N/A 05/28/2014   Procedure: COLONOSCOPY WITH PROPOFOL;  Surgeon: Carol Ada, MD;  Location: [facility];  Service: Endoscopy;  Laterality: N/A;  . ESOPHAGOGASTRODUODENOSCOPY (EGD) WITH PROPOFOL N/A 05/28/2014   Procedure: ESOPHAGOGASTRODUODENOSCOPY (EGD) WITH PROPOFOL;  Surgeon: Carol Ada, MD;  Location: [facility];  Service: Endoscopy;  Laterality: N/A;  . EXPLORATORY LAPAROTOMY     open procedure  . EYE SURGERY Bilateral    cataract removal  . SHOULDER SURGERY Right   . TOTAL KNEE ARTHROPLASTY Right 01/26/2013   DR Ronnie Derby  . TOTAL KNEE ARTHROPLASTY Right 01/26/2013   Procedure: TOTAL KNEE ARTHROPLASTY;  Surgeon: Vickey Huger, MD;  Location: [facility];  Service: Orthopedics;  Laterality: Right;  . UTERINE FIBROID SURGERY  1967    MEDICATIONS: . acetaminophen (TYLENOL) 650 MG CR tablet  . amoxicillin (AMOXIL) 500 MG capsule  . Ascorbic Acid (VITAMIN C WITH ROSE HIPS) 500 MG tablet  . atorvastatin (LIPITOR) 20 MG tablet  . benzonatate (TESSALON) 200 MG capsule  . Calcium Carbonate-Vit D-Min (CALCIUM 600+D3 PLUS MINERALS) 600-800 MG-UNIT TABS  . Cholecalciferol (VITAMIN D) 2000 units CAPS  . colchicine 0.6 MG tablet  . dextromethorphan (DELSYM) 30 MG/5ML liquid  . diphenhydrAMINE (BENADRYL) 25 mg capsule  . furosemide (LASIX) 20 MG tablet  . gabapentin (NEURONTIN) 300 MG capsule  . hydrocortisone (ANUSOL-HC) 2.5 % rectal cream  . hydroxychloroquine (PLAQUENIL) 200 MG tablet  . losartan-hydrochlorothiazide (HYZAAR) 100-25 MG per tablet  . magnesium oxide (MAG-OX) 400 MG tablet  . metoprolol tartrate (LOPRESSOR) 25 MG tablet  . Multiple Vitamin (MULTIVITAMIN) tablet  . NONFORMULARY OR COMPOUNDED ITEM  . omeprazole (PRILOSEC) 40 MG capsule  . OVER THE COUNTER MEDICATION  . potassium chloride SA (K-DUR,KLOR-CON) 20 MEQ tablet  . triamcinolone cream (KENALOG) 0.1 %  . warfarin (COUMADIN) 5 MG tablet   No current facility-administered medications for this encounter.    - Pt to stop coumadin 5 days before surgery.    If labs acceptable day of surgery,  I anticipate pt can proceed with surgery as scheduled.  Willeen Cass, FNP-BC [facility]/Anesthesiology Phone: [phone] 04/30/2017 11:44 AM

## 2017-05-03 ENCOUNTER — Ambulatory Visit: Payer: Medicare Other | Admitting: Oncology

## 2017-05-06 MED ORDER — DEXTROSE 5 % IV SOLN
3.0000 g | INTRAVENOUS | Status: AC
  Administered 2017-05-07: 3 g via INTRAVENOUS
  Filled 2017-05-06: qty 3

## 2017-05-07 ENCOUNTER — Ambulatory Visit (HOSPITAL_COMMUNITY): Payer: Medicare Other

## 2017-05-07 ENCOUNTER — Encounter (HOSPITAL_COMMUNITY)
Admission: RE | Admit: 2017-05-07 | Discharge: 2017-05-07 | Disposition: A | Discharge status: Home / Self Care | Payer: Medicare Other | Source: Ambulatory Visit | Attending: Surgery | Admitting: Surgery

## 2017-05-07 ENCOUNTER — Encounter (HOSPITAL_COMMUNITY)
Admission: RE | Disposition: A | Discharge status: Home / Self Care | Payer: Self-pay | Source: Ambulatory Visit | Attending: Surgery

## 2017-05-07 ENCOUNTER — Observation Stay (HOSPITAL_COMMUNITY)
Admission: RE | Admit: 2017-05-07 | Discharge: 2017-05-08 | Disposition: A | Discharge status: Home / Self Care | Payer: Medicare Other | Source: Ambulatory Visit | Attending: Surgery | Admitting: Surgery

## 2017-05-07 ENCOUNTER — Ambulatory Visit (HOSPITAL_COMMUNITY): Payer: Medicare Other | Admitting: Certified Registered"

## 2017-05-07 ENCOUNTER — Ambulatory Visit (HOSPITAL_COMMUNITY): Payer: Medicare Other | Admitting: Emergency Medicine

## 2017-05-07 ENCOUNTER — Encounter (HOSPITAL_COMMUNITY): Payer: Self-pay

## 2017-05-07 DIAGNOSIS — C773 Secondary and unspecified malignant neoplasm of axilla and upper limb lymph nodes: Secondary | ICD-10-CM | POA: Diagnosis not present

## 2017-05-07 DIAGNOSIS — Z7901 Long term (current) use of anticoagulants: Secondary | ICD-10-CM | POA: Insufficient documentation

## 2017-05-07 DIAGNOSIS — C50911 Malignant neoplasm of unspecified site of right female breast: Secondary | ICD-10-CM | POA: Diagnosis present

## 2017-05-07 DIAGNOSIS — I10 Essential (primary) hypertension: Secondary | ICD-10-CM | POA: Insufficient documentation

## 2017-05-07 DIAGNOSIS — Z170 Estrogen receptor positive status [ER+]: Secondary | ICD-10-CM | POA: Insufficient documentation

## 2017-05-07 DIAGNOSIS — Z79899 Other long term (current) drug therapy: Secondary | ICD-10-CM | POA: Insufficient documentation

## 2017-05-07 DIAGNOSIS — Z95828 Presence of other vascular implants and grafts: Secondary | ICD-10-CM

## 2017-05-07 DIAGNOSIS — Z87891 Personal history of nicotine dependence: Secondary | ICD-10-CM | POA: Diagnosis not present

## 2017-05-07 DIAGNOSIS — D0501 Lobular carcinoma in situ of right breast: Secondary | ICD-10-CM | POA: Diagnosis not present

## 2017-05-07 DIAGNOSIS — Z419 Encounter for procedure for purposes other than remedying health state, unspecified: Secondary | ICD-10-CM

## 2017-05-07 DIAGNOSIS — C50919 Malignant neoplasm of unspecified site of unspecified female breast: Secondary | ICD-10-CM

## 2017-05-07 DIAGNOSIS — K219 Gastro-esophageal reflux disease without esophagitis: Secondary | ICD-10-CM | POA: Diagnosis not present

## 2017-05-07 HISTORY — PX: PORTACATH PLACEMENT: SHX2246

## 2017-05-07 HISTORY — DX: Malignant neoplasm of unspecified site of unspecified female breast: C50.919

## 2017-05-07 HISTORY — PX: SIMPLE MASTECTOMY WITH AXILLARY SENTINEL NODE BIOPSY: SHX6098

## 2017-05-07 HISTORY — PX: MASTECTOMY: SHX3

## 2017-05-07 LAB — BASIC METABOLIC PANEL
Anion gap: 7 (ref 5–15)
BUN: 11 mg/dL (ref 6–20)
CHLORIDE: 105 mmol/L (ref 101–111)
CO2: 29 mmol/L (ref 22–32)
Calcium: 9.5 mg/dL (ref 8.9–10.3)
Creatinine, Ser: 0.94 mg/dL (ref 0.44–1.00)
GFR calc Af Amer: >60 mL/min (ref >60)
GFR calc non Af Amer: 56 mL/min — ABNORMAL LOW (ref >60)
Glucose, Bld: 126 mg/dL — ABNORMAL HIGH (ref 65–99)
POTASSIUM: 3.5 mmol/L (ref 3.5–5.1)
SODIUM: 141 mmol/L (ref 135–145)

## 2017-05-07 LAB — PROTIME-INR
INR: 1.04
PROTHROMBIN TIME: 13.5 s (ref 11.4–15.2)

## 2017-05-07 SURGERY — SIMPLE MASTECTOMY WITH AXILLARY SENTINEL NODE BIOPSY
Anesthesia: General | Site: Chest | Laterality: Right

## 2017-05-07 MED ORDER — ROCURONIUM BROMIDE 100 MG/10ML IV SOLN
INTRAVENOUS | Status: DC | PRN
  Administered 2017-05-07: 20 mg via INTRAVENOUS
  Administered 2017-05-07: 40 mg via INTRAVENOUS
  Administered 2017-05-07 (×2): 20 mg via INTRAVENOUS

## 2017-05-07 MED ORDER — FENTANYL CITRATE (PF) 100 MCG/2ML IJ SOLN: 25.0000 ug | INTRAMUSCULAR | Status: DC | PRN

## 2017-05-07 MED ORDER — SODIUM CHLORIDE 0.9 % IV SOLN
INTRAVENOUS | Status: AC
  Filled 2017-05-07: qty 1.2

## 2017-05-07 MED ORDER — ONDANSETRON HCL 4 MG/2ML IJ SOLN: 4.0000 mg | Freq: Once | INTRAMUSCULAR | Status: DC | PRN

## 2017-05-07 MED ORDER — FENTANYL CITRATE (PF) 100 MCG/2ML IJ SOLN
INTRAMUSCULAR | Status: AC
  Filled 2017-05-07: qty 2

## 2017-05-07 MED ORDER — DEXAMETHASONE SODIUM PHOSPHATE 10 MG/ML IJ SOLN
INTRAMUSCULAR | Status: DC | PRN
  Administered 2017-05-07: 10 mg via INTRAVENOUS

## 2017-05-07 MED ORDER — BUPIVACAINE-EPINEPHRINE (PF) 0.25% -1:200000 IJ SOLN
INTRAMUSCULAR | Status: AC
Start: 2017-05-07 — End: ?
  Filled 2017-05-07: qty 30

## 2017-05-07 MED ORDER — SODIUM CHLORIDE 0.9 % IJ SOLN
INTRAMUSCULAR | Status: AC
  Filled 2017-05-07: qty 10

## 2017-05-07 MED ORDER — LOSARTAN POTASSIUM-HCTZ 100-25 MG PO TABS: 1.0000 | ORAL_TABLET | Freq: Every day | ORAL | Status: DC

## 2017-05-07 MED ORDER — HYDRALAZINE HCL 20 MG/ML IJ SOLN: 10.0000 mg | INTRAMUSCULAR | Status: DC | PRN

## 2017-05-07 MED ORDER — METOPROLOL TARTRATE 25 MG PO TABS
25.0000 mg | ORAL_TABLET | Freq: Every day | ORAL | Status: DC
  Administered 2017-05-08: 25 mg via ORAL
  Filled 2017-05-07: qty 1

## 2017-05-07 MED ORDER — DEXTROMETHORPHAN POLISTIREX ER 30 MG/5ML PO SUER
30.0000 mg | Freq: Every day | ORAL | Status: DC | PRN
  Filled 2017-05-07: qty 5

## 2017-05-07 MED ORDER — ONDANSETRON HCL 4 MG/2ML IJ SOLN: 4.0000 mg | Freq: Four times a day (QID) | INTRAMUSCULAR | Status: DC | PRN

## 2017-05-07 MED ORDER — CHLORHEXIDINE GLUCONATE CLOTH 2 % EX PADS: 6.0000 | MEDICATED_PAD | Freq: Once | CUTANEOUS | Status: DC

## 2017-05-07 MED ORDER — PANTOPRAZOLE SODIUM 40 MG PO TBEC
40.0000 mg | DELAYED_RELEASE_TABLET | Freq: Every day | ORAL | Status: DC
  Administered 2017-05-08: 40 mg via ORAL
  Filled 2017-05-07: qty 1

## 2017-05-07 MED ORDER — PHENYLEPHRINE HCL 10 MG/ML IJ SOLN
INTRAMUSCULAR | Status: DC | PRN
  Administered 2017-05-07: 80 ug via INTRAVENOUS
  Administered 2017-05-07: 40 ug via INTRAVENOUS

## 2017-05-07 MED ORDER — BUPIVACAINE-EPINEPHRINE (PF) 0.5% -1:200000 IJ SOLN
INTRAMUSCULAR | Status: DC | PRN
  Administered 2017-05-07: 30 mL via PERINEURAL

## 2017-05-07 MED ORDER — PHENYLEPHRINE HCL 10 MG/ML IJ SOLN
INTRAMUSCULAR | Status: DC | PRN
  Administered 2017-05-07: 25 ug/min via INTRAVENOUS

## 2017-05-07 MED ORDER — DEXAMETHASONE SODIUM PHOSPHATE 10 MG/ML IJ SOLN
INTRAMUSCULAR | Status: AC
  Filled 2017-05-07: qty 3

## 2017-05-07 MED ORDER — ONDANSETRON 4 MG PO TBDP: 4.0000 mg | ORAL_TABLET | Freq: Four times a day (QID) | ORAL | Status: DC | PRN

## 2017-05-07 MED ORDER — HYDROCORTISONE 2.5 % RE CREA
1.0000 "application " | TOPICAL_CREAM | Freq: Two times a day (BID) | RECTAL | Status: DC | PRN
  Filled 2017-05-07: qty 28.35

## 2017-05-07 MED ORDER — COLCHICINE 0.6 MG PO TABS
0.6000 mg | ORAL_TABLET | Freq: Every day | ORAL | Status: DC
  Filled 2017-05-07: qty 1

## 2017-05-07 MED ORDER — LIDOCAINE 2% (20 MG/ML) 5 ML SYRINGE
INTRAMUSCULAR | Status: AC
  Filled 2017-05-07: qty 15

## 2017-05-07 MED ORDER — MIDAZOLAM HCL 2 MG/2ML IJ SOLN
INTRAMUSCULAR | Status: AC
  Filled 2017-05-07: qty 2

## 2017-05-07 MED ORDER — GABAPENTIN 300 MG PO CAPS
300.0000 mg | ORAL_CAPSULE | ORAL | Status: AC
  Administered 2017-05-07: 300 mg via ORAL
  Filled 2017-05-07: qty 1

## 2017-05-07 MED ORDER — FENTANYL CITRATE (PF) 250 MCG/5ML IJ SOLN
INTRAMUSCULAR | Status: AC
  Filled 2017-05-07: qty 5

## 2017-05-07 MED ORDER — FENTANYL CITRATE (PF) 100 MCG/2ML IJ SOLN: 50.0000 ug | INTRAMUSCULAR | Status: DC | PRN

## 2017-05-07 MED ORDER — TECHNETIUM TC 99M SULFUR COLLOID FILTERED
1.0000 | Freq: Once | INTRAVENOUS | Status: AC | PRN
  Administered 2017-05-07: 1 via INTRADERMAL

## 2017-05-07 MED ORDER — GABAPENTIN 300 MG PO CAPS
300.0000 mg | ORAL_CAPSULE | Freq: Two times a day (BID) | ORAL | Status: DC
  Administered 2017-05-07 – 2017-05-08 (×2): 300 mg via ORAL
  Filled 2017-05-07 (×2): qty 1

## 2017-05-07 MED ORDER — HYDROCODONE-ACETAMINOPHEN 5-325 MG PO TABS
1.0000 | ORAL_TABLET | ORAL | Status: DC | PRN
  Administered 2017-05-07 – 2017-05-08 (×2): 1 via ORAL
  Filled 2017-05-07: qty 2
  Filled 2017-05-07: qty 1

## 2017-05-07 MED ORDER — DIPHENHYDRAMINE HCL 25 MG PO CAPS
25.0000 mg | ORAL_CAPSULE | Freq: Four times a day (QID) | ORAL | Status: DC | PRN
  Administered 2017-05-07: 25 mg via ORAL
  Filled 2017-05-07: qty 1

## 2017-05-07 MED ORDER — FUROSEMIDE 20 MG PO TABS
20.0000 mg | ORAL_TABLET | Freq: Every day | ORAL | Status: DC
  Administered 2017-05-08: 20 mg via ORAL
  Filled 2017-05-07: qty 1

## 2017-05-07 MED ORDER — EPHEDRINE SULFATE 50 MG/ML IJ SOLN
INTRAMUSCULAR | Status: DC | PRN
  Administered 2017-05-07 (×2): 5 mg via INTRAVENOUS

## 2017-05-07 MED ORDER — PROPOFOL 10 MG/ML IV BOLUS
INTRAVENOUS | Status: AC
  Filled 2017-05-07: qty 20

## 2017-05-07 MED ORDER — CEFAZOLIN SODIUM-DEXTROSE 2-4 GM/100ML-% IV SOLN
2.0000 g | Freq: Three times a day (TID) | INTRAVENOUS | Status: AC
  Administered 2017-05-07: 2 g via INTRAVENOUS
  Filled 2017-05-07: qty 100

## 2017-05-07 MED ORDER — KCL IN DEXTROSE-NACL 20-5-0.9 MEQ/L-%-% IV SOLN
INTRAVENOUS | Status: DC
  Administered 2017-05-07 – 2017-05-08 (×2): via INTRAVENOUS
  Filled 2017-05-07 (×4): qty 1000

## 2017-05-07 MED ORDER — HEMOSTATIC AGENTS (NO CHARGE) OPTIME
TOPICAL | Status: DC | PRN
  Administered 2017-05-07 (×3): 1 via TOPICAL

## 2017-05-07 MED ORDER — LIDOCAINE HCL (CARDIAC) PF 100 MG/5ML IV SOSY
PREFILLED_SYRINGE | INTRAVENOUS | Status: DC | PRN
  Administered 2017-05-07: 80 mg via INTRAVENOUS

## 2017-05-07 MED ORDER — ONDANSETRON HCL 4 MG/2ML IJ SOLN
INTRAMUSCULAR | Status: DC | PRN
  Administered 2017-05-07: 4 mg via INTRAVENOUS

## 2017-05-07 MED ORDER — HEPARIN SOD (PORK) LOCK FLUSH 100 UNIT/ML IV SOLN
INTRAVENOUS | Status: DC | PRN
  Administered 2017-05-07: 500 [IU] via INTRAVENOUS

## 2017-05-07 MED ORDER — BENZONATATE 100 MG PO CAPS: 200.0000 mg | ORAL_CAPSULE | Freq: Three times a day (TID) | ORAL | Status: DC | PRN

## 2017-05-07 MED ORDER — ACETAMINOPHEN 325 MG PO TABS: 650.0000 mg | ORAL_TABLET | Freq: Three times a day (TID) | ORAL | Status: DC | PRN

## 2017-05-07 MED ORDER — HEPARIN SOD (PORK) LOCK FLUSH 100 UNIT/ML IV SOLN
INTRAVENOUS | Status: AC
Start: 2017-05-07 — End: ?
  Filled 2017-05-07: qty 5

## 2017-05-07 MED ORDER — HYDROCHLOROTHIAZIDE 25 MG PO TABS
25.0000 mg | ORAL_TABLET | Freq: Every day | ORAL | Status: DC
  Administered 2017-05-08: 25 mg via ORAL
  Filled 2017-05-07: qty 1

## 2017-05-07 MED ORDER — PHENYLEPHRINE 40 MCG/ML (10ML) SYRINGE FOR IV PUSH (FOR BLOOD PRESSURE SUPPORT)
PREFILLED_SYRINGE | INTRAVENOUS | Status: AC
  Filled 2017-05-07: qty 20

## 2017-05-07 MED ORDER — PROPOFOL 10 MG/ML IV BOLUS
INTRAVENOUS | Status: DC | PRN
  Administered 2017-05-07: 130 mg via INTRAVENOUS

## 2017-05-07 MED ORDER — ATORVASTATIN CALCIUM 20 MG PO TABS
20.0000 mg | ORAL_TABLET | Freq: Every day | ORAL | Status: DC
  Administered 2017-05-07: 20 mg via ORAL
  Filled 2017-05-07: qty 1

## 2017-05-07 MED ORDER — CELECOXIB 200 MG PO CAPS
200.0000 mg | ORAL_CAPSULE | Freq: Two times a day (BID) | ORAL | Status: DC
  Administered 2017-05-08: 200 mg via ORAL
  Filled 2017-05-07 (×2): qty 1

## 2017-05-07 MED ORDER — HYDROXYCHLOROQUINE SULFATE 200 MG PO TABS
200.0000 mg | ORAL_TABLET | Freq: Two times a day (BID) | ORAL | Status: DC
  Filled 2017-05-07 (×3): qty 1

## 2017-05-07 MED ORDER — GABAPENTIN 300 MG PO CAPS: 300.0000 mg | ORAL_CAPSULE | Freq: Every day | ORAL | Status: DC

## 2017-05-07 MED ORDER — ENOXAPARIN SODIUM 40 MG/0.4ML ~~LOC~~ SOLN
40.0000 mg | SUBCUTANEOUS | Status: DC
  Administered 2017-05-08: 40 mg via SUBCUTANEOUS
  Filled 2017-05-07: qty 0.4

## 2017-05-07 MED ORDER — LACTATED RINGERS IV SOLN
INTRAVENOUS | Status: DC
  Administered 2017-05-07 (×2): via INTRAVENOUS

## 2017-05-07 MED ORDER — FENTANYL CITRATE (PF) 100 MCG/2ML IJ SOLN
INTRAMUSCULAR | Status: DC | PRN
  Administered 2017-05-07: 25 ug via INTRAVENOUS
  Administered 2017-05-07 (×2): 50 ug via INTRAVENOUS
  Administered 2017-05-07: 75 ug via INTRAVENOUS

## 2017-05-07 MED ORDER — 0.9 % SODIUM CHLORIDE (POUR BTL) OPTIME
TOPICAL | Status: DC | PRN
  Administered 2017-05-07 (×2): 1000 mL

## 2017-05-07 MED ORDER — LOSARTAN POTASSIUM 50 MG PO TABS
100.0000 mg | ORAL_TABLET | Freq: Every day | ORAL | Status: DC
  Administered 2017-05-08: 100 mg via ORAL
  Filled 2017-05-07: qty 2

## 2017-05-07 MED ORDER — BUPIVACAINE-EPINEPHRINE 0.25% -1:200000 IJ SOLN
INTRAMUSCULAR | Status: DC | PRN
  Administered 2017-05-07: 10 mL

## 2017-05-07 MED ORDER — HEPARIN SODIUM (PORCINE) 5000 UNIT/ML IJ SOLN
INTRAMUSCULAR | Status: DC | PRN
  Administered 2017-05-07: 500 mL

## 2017-05-07 MED ORDER — ONDANSETRON HCL 4 MG/2ML IJ SOLN
INTRAMUSCULAR | Status: AC
  Filled 2017-05-07: qty 6

## 2017-05-07 MED ORDER — METHYLENE BLUE 0.5 % INJ SOLN
INTRAVENOUS | Status: AC
  Filled 2017-05-07: qty 10

## 2017-05-07 MED ORDER — SUGAMMADEX SODIUM 500 MG/5ML IV SOLN
INTRAVENOUS | Status: DC | PRN
  Administered 2017-05-07: 400 mg via INTRAVENOUS

## 2017-05-07 MED ORDER — KETOROLAC TROMETHAMINE 15 MG/ML IJ SOLN
15.0000 mg | INTRAMUSCULAR | Status: AC
  Administered 2017-05-07: 15 mg via INTRAVENOUS
  Filled 2017-05-07: qty 1

## 2017-05-07 SURGICAL SUPPLY — 57 items
ADH SKN CLS APL DERMABOND .7 (GAUZE/BANDAGES/DRESSINGS) ×4
APPLIER CLIP 9.375 MED OPEN (MISCELLANEOUS) ×4
APR CLP MED 9.3 20 MLT OPN (MISCELLANEOUS) ×2
BAG DECANTER FOR FLEXI CONT (MISCELLANEOUS) ×4 IMPLANT
BINDER BREAST XXLRG (GAUZE/BANDAGES/DRESSINGS) ×2 IMPLANT
BLADE SURG 11 STRL SS (BLADE) ×4 IMPLANT
CANISTER SUCT 3000ML PPV (MISCELLANEOUS) ×4 IMPLANT
CHLORAPREP W/TINT 26ML (MISCELLANEOUS) ×6 IMPLANT
CLIP APPLIE 9.375 MED OPEN (MISCELLANEOUS) ×2 IMPLANT
CONT SPEC 4OZ CLIKSEAL STRL BL (MISCELLANEOUS) ×4 IMPLANT
COVER PROBE W GEL 5X96 (DRAPES) ×4 IMPLANT
COVER SURGICAL LIGHT HANDLE (MISCELLANEOUS) ×4 IMPLANT
COVER TRANSDUCER ULTRASND GEL (DRAPE) ×4 IMPLANT
CRADLE DONUT ADULT HEAD (MISCELLANEOUS) ×4 IMPLANT
DERMABOND ADVANCED (GAUZE/BANDAGES/DRESSINGS) ×4
DERMABOND ADVANCED .7 DNX12 (GAUZE/BANDAGES/DRESSINGS) ×2 IMPLANT
DRAIN CHANNEL 19F RND (DRAIN) ×6 IMPLANT
DRAPE C-ARM 42X72 X-RAY (DRAPES) ×4 IMPLANT
DRAPE CHEST BREAST 15X10 FENES (DRAPES) ×4 IMPLANT
DRAPE LAPAROSCOPIC ABDOMINAL (DRAPES) ×4 IMPLANT
DRAPE UTILITY XL STRL (DRAPES) ×4 IMPLANT
ELECT CAUTERY BLADE 6.4 (BLADE) ×4 IMPLANT
ELECT REM PT RETURN 9FT ADLT (ELECTROSURGICAL) ×4
ELECTRODE REM PT RTRN 9FT ADLT (ELECTROSURGICAL) ×2 IMPLANT
EVACUATOR SILICONE 100CC (DRAIN) ×6 IMPLANT
GAUZE SPONGE 4X4 16PLY XRAY LF (GAUZE/BANDAGES/DRESSINGS) ×4 IMPLANT
GLOVE BIO SURGEON STRL SZ7.5 (GLOVE) ×4 IMPLANT
GLOVE BIO SURGEON STRL SZ8 (GLOVE) ×6 IMPLANT
GLOVE BIOGEL PI IND STRL 7.5 (GLOVE) IMPLANT
GLOVE BIOGEL PI IND STRL 8 (GLOVE) ×2 IMPLANT
GLOVE BIOGEL PI INDICATOR 7.5 (GLOVE) ×2
GLOVE BIOGEL PI INDICATOR 8 (GLOVE) ×4
GOWN STRL REUS W/ TWL LRG LVL3 (GOWN DISPOSABLE) ×6 IMPLANT
GOWN STRL REUS W/ TWL XL LVL3 (GOWN DISPOSABLE) ×2 IMPLANT
GOWN STRL REUS W/TWL LRG LVL3 (GOWN DISPOSABLE) ×12
GOWN STRL REUS W/TWL XL LVL3 (GOWN DISPOSABLE) ×8
HEMOSTAT ARISTA ABSORB 3G PWDR (MISCELLANEOUS) ×6 IMPLANT
KIT BASIN OR (CUSTOM PROCEDURE TRAY) ×4 IMPLANT
KIT PORT POWER 8FR ISP CVUE (Port) ×2 IMPLANT
KIT TURNOVER KIT B (KITS) ×4 IMPLANT
NDL HYPO 25GX1X1/2 BEV (NEEDLE) ×2 IMPLANT
NEEDLE HYPO 25GX1X1/2 BEV (NEEDLE) ×4 IMPLANT
NS IRRIG 1000ML POUR BTL (IV SOLUTION) ×6 IMPLANT
PACK GENERAL/GYN (CUSTOM PROCEDURE TRAY) ×4 IMPLANT
PAD ABD 8X10 STRL (GAUZE/BANDAGES/DRESSINGS) ×2 IMPLANT
PAD ARMBOARD 7.5X6 YLW CONV (MISCELLANEOUS) ×4 IMPLANT
SPECIMEN JAR X LARGE (MISCELLANEOUS) ×4 IMPLANT
SUT ETHILON 3 0 FSL (SUTURE) ×6 IMPLANT
SUT MNCRL AB 4-0 PS2 18 (SUTURE) ×4 IMPLANT
SUT PROLENE 2 0 SH 30 (SUTURE) ×4 IMPLANT
SUT VIC AB 3-0 SH 18 (SUTURE) ×6 IMPLANT
SUT VIC AB 3-0 SH 27 (SUTURE) ×4
SUT VIC AB 3-0 SH 27X BRD (SUTURE) ×2 IMPLANT
SYR 20ML ECCENTRIC (SYRINGE) ×8 IMPLANT
SYR 5ML LUER SLIP (SYRINGE) ×4 IMPLANT
SYR CONTROL 10ML LL (SYRINGE) ×4 IMPLANT
TOWEL GREEN STERILE FF (TOWEL DISPOSABLE) ×2 IMPLANT

## 2017-05-07 NOTE — Op Note (Signed)
Preoperative diagnosis stage II right breast cancer  Postoperative diagnosis: Same  Procedure: Right simple mastectomy with right axillary sentinel lymph node mapping of deep right axillary sentinel nodes x3 and placement of right internal jugular Port-A-Cath with ultrasound and C arm assistance  Surgeon: Erroll Luna, MD  Anesthesia: General with pectoral block local  EBL: 70 cc  Drains: two 19 round drains  Specimens: Right breast with 3 right axillary sentinel nodes to pathology  IV fluids: Per anesthesia record  Indications for procedure: The patient presents for right simple mastectomy with stage II right breast cancer.  She will require postoperative chemotherapy and a Port-A-Cath was requested by oncology.  She did not wish to pursue breast conserving surgery.  She did not wish to pursue reconstruction.  Risks and benefits of surgery were discussed.The surgical and non surgical options have been discussed with the patient.  Risks of surgery include bleeding,  Infection,  Flap necrosis,  Tissue loss,  Chronic pain, death, Numbness,  And the need for additional procedures.  Reconstruction options also have been discussed with the patient as well.  The patient agrees to proceed. Risk of port placement include bleeding, infection, death, DVT, arterial injury, mediastinal injury, heart injury, lung injury, catheter malfunction, catheter migration, blood clots, stroke, DVT, myocardial infarction, and the need for the procedures and/or revisional surgery.Sentinel lymph node mapping and dissection has been discussed with the patient.  Risk of bleeding,  Infection,  Seroma formation,  Additional procedures,,  Shoulder weakness ,  Shoulder stiffness,  Nerve and blood vessel injury and reaction to the mapping dyes have been discussed.  Alternatives to surgery have been discussed with the patient.  The patient agrees to proceed.    Description of procedure: The patient was met in the holding  area.  The right side was marked as the correct side after discussing this with the patient and reviewing her chart and records.  She had some bruising left over on the right from her biopsy.  A right pectoral block was placed by anesthesia.  She was taken back to the operating room.  She underwent injection of technetium sulfur colloid by radiology.  She was placed supine on the operating table.  General anesthesia was initiated without difficulty.  Right arm was tucked.  The Port-A-Cath was done first.  The right upper chest was prepped and draped in a sterile fashion.  Timeout was done to verify proper patient procedure.  Ultrasound was used to identify the right internal jugular vein.  A needle was placed under ultrasound guidance into this.  Dark nonpulsatile blood was returned with the patient in Trendelenburg position.  A wire was fed through this without difficulty.  C-arm was used to identify the wire entry in the superior vena cava.  A small incision was made just below the clavicle using local anesthetic.  A small pocket was made.  An 8 [place] Port-A-Cath was brought in the field assembled and flushed.  It was then tunneled from the lower incision to the wire exit site.  I then placed a dilator introducer complex over the wire advanced a smooth wire to and fro without resistance and it advanced easily.  I then placed the catheter into the introducer after removing the wire and dilator.  This was fed through the peel-away without difficulty.  C-arm showed the tip to be in the distal superior vena cava without any calvarium feature.  The catheter was interrogated and blood drew back easily.  It flushed easily with  heparinized saline.  5 cc of 100 units/cc of saline with heparin were placed into the port.  It was secured to the chest wall with 2-0 Novafil.  Incisions were closed with 3-0 Vicryl and 4-0 Monocryl.  The mastectomy was done next.  The breast was marked with marking pen of the right.   Curvilinear incision was made above and below the nipple areolar complex.  Superior flap was raised up to the clavicle.  Inferior flap was raised down to the inframammary fold.  The breast was then dissected off the pectoralis muscle taking the fascia with it in a medial to lateral fashion.  Vessels were clipped or oversewn were cauterized for hemostasis.  Once free reach the lateral breast attachments to the tail of Donalda Ewings was removed with the breast and passed off the field.  Neoprobe was used to identify 3 hot sentinel nodes in the right axilla which are level 2 nodes.  These were removed.  Background counts approaches 0.  Meticulous hemostasis was undertaken with cautery and Arista.  She has to be on Coumadin therefore great attention to this was made.  Irrigation was done prior to applying the Arista.  Hemostasis seemed excellent.  Through 2 separate stab incisions 19 round drains were placed and secured with 2-0 nylon.  The incision was cut was closed with 3-0 Vicryl and 4-0 Monocryl.  Dermabond applied to all incisions.  Breast binder placed.  Drains placed to bulb suction with good seal.  All final counts were found to be correct.  The patient was awoken extubated taken to recovery in satisfactory condition.

## 2017-05-07 NOTE — Interval H&P Note (Signed)
History and Physical Interval Note:  05/07/2017 1:19 PM  Tara Cox  has presented today for surgery, with the diagnosis of RIGHT BREAST CANCER  The various methods of treatment have been discussed with the patient and family. After consideration of risks, benefits and other options for treatment, the patient has consented to  Procedure(s): RIGHT SIMPLE MASTECTOMY WITH AXILLARY SENTINEL NODE BIOPSY (Right) INSERTION PORT-A-CATH (N/A) as a surgical intervention .  The patient's history has been reviewed, patient examined, no change in status, stable for surgery.  I have reviewed the patient's chart and labs.  Questions were answered to the patient's satisfaction.     [place]

## 2017-05-07 NOTE — Anesthesia Procedure Notes (Signed)
Anesthesia Regional Block: Pectoralis block   Pre-Anesthetic Checklist: ,, timeout performed, Correct Patient, Correct Site, Correct Laterality, Correct Procedure, Correct Position, site marked, Risks and benefits discussed,  Surgical consent,  Pre-op evaluation,  At surgeon's request and post-op pain management  Laterality: Right  Prep: chloraprep       Needles:  Injection technique: Single-shot  Needle Type: Echogenic Needle     Needle Length: 9cm  Needle Gauge: 21     Additional Needles:   Procedures:,,,, ultrasound used (permanent image in chart),,,,  Narrative:  Start time: 05/07/2017 1:49 PM End time: 05/07/2017 1:54 PM Injection made incrementally with aspirations every 5 mL.  Performed by: Personally  Anesthesiologist: Catalina Gravel, MD  Additional Notes: No pain on injection. No increased resistance to injection. Injection made in 5cc increments.  Good needle visualization.  Patient tolerated procedure well.

## 2017-05-07 NOTE — Anesthesia Procedure Notes (Addendum)
Procedure Name: Intubation Date/Time: 05/07/2017 2:26 PM Performed by: Bryson Corona, CRNA Pre-anesthesia Checklist: Patient identified, Emergency Drugs available, Suction available and Patient being monitored Patient Re-evaluated:Patient Re-evaluated prior to induction Oxygen Delivery Method: Circle System Utilized Preoxygenation: Pre-oxygenation with 100% oxygen Induction Type: IV induction Ventilation: Mask ventilation without difficulty Laryngoscope Size: Mac and 3 Grade View: Grade III Tube type: Oral Tube size: 7.0 mm Number of attempts: 2 Airway Equipment and Method: Stylet and Oral airway Placement Confirmation: ETT inserted through vocal cords under direct vision,  positive ETCO2 and breath sounds checked- equal and bilateral Secured at: 22 cm Tube secured with: Tape Dental Injury: Teeth and Oropharynx as per pre-operative assessment  Difficulty Due To: Difficult Airway- due to limited oral opening Comments: Intubation performed by Joie Bimler, CRNA. DL with Sabra Heck 2 unable to obtain view. DL x 2 with MAC 3. Grade 3 view. Small mouth opening, prominent front teeth.

## 2017-05-07 NOTE — Transfer of Care (Signed)
Immediate Anesthesia Transfer of Care Note  Patient: Tara Cox  Procedure(s) Performed: RIGHT SIMPLE MASTECTOMY WITH AXILLARY SENTINEL NODE BIOPSY (Right Breast) INSERTION PORT-A-CATH (Right Chest)  Patient Location: PACU  Anesthesia Type:General  Level of Consciousness: awake, alert  and oriented  Airway & Oxygen Therapy: Patient Spontanous Breathing and Patient connected to nasal cannula oxygen  Post-op Assessment: Report given to RN and Post -op Vital signs reviewed and stable  Post vital signs: Reviewed and stable  Last Vitals:  Vitals Value Taken Time  BP 158/79 05/07/2017  4:42 PM  Temp    Pulse 88 05/07/2017  4:42 PM  Resp 12 05/07/2017  4:42 PM  SpO2 100 % 05/07/2017  4:42 PM  Vitals shown include unvalidated device data.  Last Pain:  Vitals:   05/07/17 1251  TempSrc:   PainSc: 0-No pain         Complications: No apparent anesthesia complications

## 2017-05-07 NOTE — Anesthesia Preprocedure Evaluation (Addendum)
Anesthesia Evaluation  Patient identified by MRN, date of birth, ID band Patient awake    Reviewed: Allergy & Precautions, NPO status , Patient's Chart, lab work & pertinent test results, reviewed documented beta blocker date and time   History of Anesthesia Complications (+) PONV and history of anesthetic complications  Airway Mallampati: II  TM Distance: >3 FB Neck ROM: Full    Dental  (+) Dental Advisory Given, Missing, Partial Lower, Partial Upper   Pulmonary former smoker,    Pulmonary exam normal breath sounds clear to auscultation       Cardiovascular hypertension, Pt. on medications and Pt. on home beta blockers Normal cardiovascular exam+ dysrhythmias Atrial Fibrillation  Rhythm:Regular Rate:Normal  04/16/17: Impressions:  - Normal LV size with mild focal basal septal hypertrophy. EF 60-65%. Strain as above. Normal RV size and systolic function. No significant valvular abnormalities.   Neuro/Psych  Headaches, negative psych ROS   GI/Hepatic Neg liver ROS, GERD  Medicated,  Endo/Other  negative endocrine ROSObesity   Renal/GU negative Renal ROS     Musculoskeletal  (+) Arthritis ,   Abdominal   Peds  Hematology  (+) Blood dyscrasia (Thrombocytopenia; coumadin), ,   Anesthesia Other Findings Day of surgery medications reviewed with the patient.  Right breast cancer  Reproductive/Obstetrics                            Anesthesia Physical Anesthesia Plan  ASA: III  Anesthesia Plan: General   Post-op Pain Management:  Regional for Post-op pain   Induction: Intravenous  PONV Risk Score and Plan: 4 or greater and Dexamethasone and Ondansetron  Airway Management Planned: Oral ETT  Additional Equipment:   Intra-op Plan:   Post-operative Plan: Extubation in OR  Informed Consent: I have reviewed the patients History and Physical, chart, labs and discussed the procedure  including the risks, benefits and alternatives for the proposed anesthesia with the patient or authorized representative who has indicated his/her understanding and acceptance.   Dental advisory given  Plan Discussed with: CRNA  Anesthesia Plan Comments:         Anesthesia Quick Evaluation

## 2017-05-07 NOTE — Progress Notes (Signed)
Care of pt assumed by MA Vasilia Dise RN 

## 2017-05-07 NOTE — Anesthesia Postprocedure Evaluation (Signed)
Anesthesia Post Note  Patient: Tara Cox  Procedure(s) Performed: RIGHT SIMPLE MASTECTOMY WITH AXILLARY SENTINEL NODE BIOPSY (Right Breast) INSERTION PORT-A-CATH (Right Chest)     Patient location during evaluation: PACU Anesthesia Type: General Level of consciousness: awake and alert Pain management: pain level controlled Vital Signs Assessment: post-procedure vital signs reviewed and stable Respiratory status: spontaneous breathing, nonlabored ventilation and respiratory function stable Cardiovascular status: blood pressure returned to baseline and stable Postop Assessment: no apparent nausea or vomiting Anesthetic complications: no    Last Vitals:  Vitals:   05/07/17 1857 05/07/17 2109  BP: (!) 165/75 140/72  Pulse: 83 89  Resp: 16 18  Temp: (!) 36.4 C 36.7 C  SpO2: 95% 95%    Last Pain:  Vitals:   05/07/17 2109  TempSrc: Oral  PainSc:                  Catalina Gravel

## 2017-05-08 ENCOUNTER — Encounter (HOSPITAL_COMMUNITY): Payer: Self-pay | Admitting: Surgery

## 2017-05-08 DIAGNOSIS — D0501 Lobular carcinoma in situ of right breast: Secondary | ICD-10-CM | POA: Diagnosis not present

## 2017-05-08 LAB — COMPREHENSIVE METABOLIC PANEL
ALK PHOS: 73 U/L (ref 38–126)
ALT: 42 U/L (ref 14–54)
AST: 38 U/L (ref 15–41)
Albumin: 3.4 g/dL — ABNORMAL LOW (ref 3.5–5.0)
Anion gap: 8 (ref 5–15)
BUN: 12 mg/dL (ref 6–20)
CALCIUM: 9 mg/dL (ref 8.9–10.3)
CHLORIDE: 105 mmol/L (ref 101–111)
CO2: 27 mmol/L (ref 22–32)
CREATININE: 1.09 mg/dL — AB (ref 0.44–1.00)
GFR calc non Af Amer: 47 mL/min — ABNORMAL LOW (ref >60)
GFR, EST AFRICAN AMERICAN: 54 mL/min — AB (ref >60)
Glucose, Bld: 201 mg/dL — ABNORMAL HIGH (ref 65–99)
Potassium: 4.5 mmol/L (ref 3.5–5.1)
SODIUM: 140 mmol/L (ref 135–145)
Total Bilirubin: 0.5 mg/dL (ref 0.3–1.2)
Total Protein: 5.9 g/dL — ABNORMAL LOW (ref 6.5–8.1)

## 2017-05-08 LAB — CBC
HCT: 39.8 % (ref 36.0–46.0)
Hemoglobin: 12.7 g/dL (ref 12.0–15.0)
MCH: 26.7 pg (ref 26.0–34.0)
MCHC: 31.9 g/dL (ref 30.0–36.0)
MCV: 83.6 fL (ref 78.0–100.0)
PLATELETS: 171 10*3/uL (ref 150–400)
RBC: 4.76 MIL/uL (ref 3.87–5.11)
RDW: 14.5 % (ref 11.5–15.5)
WBC: 13.5 10*3/uL — ABNORMAL HIGH (ref 4.0–10.5)

## 2017-05-08 MED ORDER — HYDROCODONE-ACETAMINOPHEN 5-325 MG PO TABS: 1.0000 | ORAL_TABLET | ORAL | 0 refills | Status: DC | PRN

## 2017-05-08 MED ORDER — HYDROCODONE-ACETAMINOPHEN 5-325 MG PO TABS: 1.0000 | ORAL_TABLET | Freq: Four times a day (QID) | ORAL | 0 refills | Status: DC | PRN

## 2017-05-08 NOTE — Progress Notes (Signed)
Patient inquired about higher commode. Upon assessment of equipment patient did not feel she needed it. Discharge instructions were reviewed and patient verbalized understanding. Patient left unit via wheelchair with nursing staff.

## 2017-05-08 NOTE — Care Management (Signed)
Patient requesting "a bigger 3 in 1 " then she has at home. James with [facility] will discuss with patient.   Magdalen Spatz RN BSN [phone]

## 2017-05-08 NOTE — Discharge Instructions (Signed)
[facility] [place] °[phone] ° °MASTECTOMY: POST OP INSTRUCTIONS ° °Always review your discharge instruction sheet given to you by the facility where your surgery was performed. °IF YOU HAVE DISABILITY OR FAMILY LEAVE FORMS, YOU MUST BRING THEM TO THE OFFICE FOR PROCESSING.   °DO NOT GIVE THEM TO YOUR DOCTOR. °A prescription for pain medication may be given to you upon discharge.  Take your pain medication as prescribed, if needed.  If narcotic pain medicine is not needed, then you may take acetaminophen (Tylenol) or ibuprofen (Advil) as needed. °1. Take your usually prescribed medications unless otherwise directed. °2. If you need a refill on your pain medication, please contact your pharmacy.  They will contact our office to request authorization.  Prescriptions will not be filled after 5pm or on week-ends. °3. You should follow a light diet the first few days after arrival home, such as soup and crackers, etc.  Resume your normal diet the day after surgery. °4. Most patients will experience some swelling and bruising on the chest and underarm.  Ice packs will help.  Swelling and bruising can take several days to resolve.  °5. It is common to experience some constipation if taking pain medication after surgery.  Increasing fluid intake and taking a stool softener (such as Colace) will usually help or prevent this problem from occurring.  A mild laxative (Milk of Magnesia or Miralax) should be taken according to package instructions if there are no bowel movements after 48 hours. °6. Unless discharge instructions indicate otherwise, leave your bandage dry and in place until your next appointment in 3-5 days.  You may take a limited sponge bath.  No tube baths or showers until the drains are removed.  You may have steri-strips (small skin tapes) in place directly over the incision.  These strips should be left on the skin for 7-10 days.  If your surgeon used skin glue on the incision, you may  shower in 24 hours.  The glue will flake off over the next 2-3 weeks.  Any sutures or staples will be removed at the office during your follow-up visit. °7. DRAINS:  If you have drains in place, it is important to keep a list of the amount of drainage produced each day in your drains.  Before leaving the hospital, you should be instructed on drain care.  Call our office if you have any questions about your drains. °8. ACTIVITIES:  You may resume regular (light) daily activities beginning the next day--such as daily self-care, walking, climbing stairs--gradually increasing activities as tolerated.  You may have sexual intercourse when it is comfortable.  Refrain from any heavy lifting or straining until approved by your doctor. °a. You may drive when you are no longer taking prescription pain medication, you can comfortably wear a seatbelt, and you can safely maneuver your car and apply brakes. °b. RETURN TO WORK:  __________________________________________________________ °9. You should see your doctor in the office for a follow-up appointment approximately 3-5 days after your surgery.  Your doctor’s nurse will typically make your follow-up appointment when she calls you with your pathology report.  Expect your pathology report 2-3 business days after your surgery.  You may call to check if you do not hear from us after three days.   °10. OTHER INSTRUCTIONS: ______________________________________________________________________________________________ ____________________________________________________________________________________________ °WHEN TO CALL YOUR DOCTOR: °1. Fever over 101.0 °2. Nausea and/or vomiting °3. Extreme swelling or bruising °4. Continued bleeding from incision. °5. Increased pain, redness, or drainage from the incision. °  The clinic staff is available to answer your questions during regular business hours.  Please dont hesitate to call and ask to speak to one of the nurses for clinical  concerns.  If you have a medical emergency, go to the nearest emergency room or call 911.  A surgeon from [facility] is always on call at the hospital. [address], [place], [place], [place]  [address] ? [address], [place], [place]   [address] [phone] ? [phone] ? FAX [phone] Web site: [email or web address]     RESTART COUMADIN ON Friday CALL PCP FOR FOLLOW UP NEXT WEEK FOR BLOOD WORK  OK TO SHOWER AND USE ARM  NO HEAVY LIFTING   Surgical Drain Home Care Surgical drains are used to remove extra fluid that normally builds up in a surgical wound after surgery. A surgical drain helps to heal a surgical wound. Different kinds of surgical drains include:  Active drains. These drains use suction to pull drainage away from the surgical wound. Drainage flows through a tube to a container outside of the body. It is important to keep the bulb or the drainage container flat (compressed) at all times, except while you empty it. Flattening the bulb or container creates suction. The two most common types of active drains are bulb drains and Hemovac drains.  Passive drains. These drains allow fluid to drain naturally, by gravity. Drainage flows through a tube to a bandage (dressing) or a container outside of the body. Passive drains do not need to be emptied. The most common type of passive drain is the Penrose drain.  A drain is placed during surgery. Immediately after surgery, drainage is usually bright red and a little thicker than water. The drainage may gradually turn yellow or pink and become thinner. It is likely that your health care provider will remove the drain when the drainage stops or when the amount decreases to 1-2 Tbsp (15-30 mL) during a 24-hour period. How to care for your surgical drain  Keep the skin around the drain dry and covered with a dressing at all times.  Check your drain area every day for signs of infection. Check for: ? More redness, swelling,  or pain. ? Pus or a bad smell. ? Cloudy drainage. Follow instructions from your health care provider about how to take care of your drain and how to change your dressing. Change your dressing at least one time every day. Change it more often if needed to keep the dressing dry. Make sure you: 1. Gather your supplies, including: ? Tape. ? Germ-free cleaning solution (sterile saline). ? Split gauze drain sponge: 4 x 4 inches (10 x 10 cm). ? Gauze square: 4 x 4 inches (10 x 10 cm). 2. Wash your hands with soap and water before you change your dressing. If soap and water are not available, use hand sanitizer. 3. Remove the old dressing. Avoid using scissors to do that. 4. Use sterile saline to clean your skin around the drain. 5. Place the tube through the slit in a drain sponge. Place the drain sponge so that it covers your wound. 6. Place the gauze square or another drain sponge on top of the drain sponge that is on the wound. Make sure the tube is between those layers. 7. Tape the dressing to your skin. 8. If you have an active bulb or Hemovac drain, tape the drainage tube to your skin 1-2 inches (2.5-5 cm) below the place where the tube enters  your body. Taping keeps the tube from pulling on any stitches (sutures) that you have. 9. Wash your hands with soap and water. 10. Write down the color of your drainage and how often you change your dressing.  How to empty your active bulb or Hemovac drain 1. Make sure that you have a measuring cup that you can empty your drainage into. 2. Wash your hands with soap and water. If soap and water are not available, use hand sanitizer. 3. Gently move your fingers down the tube while squeezing very lightly. This is called stripping the tube. This clears any drainage, clots, or tissue from the tube. ? Do not pull on the tube. ? You may need to strip the tube several times every day to keep the tube clear. 4. Open the bulb cap or the drain plug. Do not touch  the inside of the cap or the bottom of the plug. 5. Empty all of the drainage into the measuring cup. 6. Compress the bulb or the container and replace the cap or the plug. To compress the bulb or the container, squeeze it firmly in the middle while you close the cap or plug the container. 7. Write down the amount of drainage that you have in each 24-hour period. If you have less than 2 Tbsp (30 mL) of drainage during 24 hours, contact your health care provider. 8. Flush the drainage down the toilet. 9. Wash your hands with soap and water. Contact a health care provider if:  You have more redness, swelling, or pain around your drain area.  The amount of drainage that you have is increasing instead of decreasing.  You have pus or a bad smell coming from your drain area.  You have a fever.  You have drainage that is cloudy.  There is a sudden stop or a sudden decrease in the amount of drainage that you have.  Your tube falls out.  Your active draindoes not stay compressedafter you empty it. This information is not intended to replace advice given to you by your health care provider. Make sure you discuss any questions you have with your health care provider. Document Released: 12/23/1999 Document Revised: 06/02/2015 Document Reviewed: 07/14/2014 Elsevier Interactive Patient Education  2018 [employer].

## 2017-05-08 NOTE — Progress Notes (Signed)
1 Day Post-Op   Subjective/Chief Complaint: Pt sore    Objective: Vital signs in last 24 hours: Temp:  [97.2 F (36.2 C)-98.2 F (36.8 C)] 97.7 F (36.5 C) (05/01 0605) Pulse Rate:  [78-89] 78 (05/01 0605) Resp:  [10-18] 17 (05/01 0605) BP: (115-165)/(60-82) 115/60 (05/01 0605) SpO2:  [95 %-100 %] 97 % (05/01 0605) Weight:  [109.5 kg (241 lb 8 oz)] 109.5 kg (241 lb 8 oz) (04/30 1219) Last BM Date: 05/07/17  Intake/Output from previous day: 04/30 0701 - 05/01 0700 In: 2688.3 [P.O.:420; I.V.:2068.3; IV Piggyback:100] Out: 380 [Drains:305; Blood:75] Intake/Output this shift: No intake/output data recorded.  Incision/Wound:flaps viable  No hematoma soft  Drainage bloody /serous 300 cc overnight   Lab Results:  No results for input(s): WBC, HGB, HCT, PLT in the last 72 hours. BMET Recent Labs    05/07/17 1310 05/08/17 0310  NA 141 140  K 3.5 4.5  CL 105 105  CO2 29 27  GLUCOSE 126* 201*  BUN 11 12  CREATININE 0.94 1.09*  CALCIUM 9.5 9.0   PT/INR Recent Labs    05/07/17 1310  LABPROT 13.5  INR 1.04   ABG No results for input(s): PHART, HCO3 in the last 72 hours.  Invalid input(s): PCO2, PO2  Studies/Results: Nm Sentinel Node Inj-no Rpt (breast)  Result Date: 05/07/2017 Sulfur colloid was injected by the nuclear medicine technologist for melanoma sentinel node.   Dg Chest Port 1 View  Result Date: 05/07/2017 CLINICAL DATA:  Status post porta catheter placement. EXAM: PORTABLE CHEST 1 VIEW COMPARISON:  01/19/2013. FINDINGS: Interval mildly enlarged cardiac silhouette. Interval right jugular porta catheter with its tip in the superior vena cava near the cavoatrial junction. No pneumothorax. Clear lungs with mildly prominent interstitial markings. Interval surgical clips overlying the right hilum. Mild subcutaneous emphysema on the right. Thoracic spine degenerative changes. IMPRESSION: 1. Right porta catheter tip in the superior vena cava that pneumothorax. 2.  Interval mild cardiomegaly. 3. Mild chronic interstitial lung disease. 4. Mild subcutaneous emphysema on the right. Electronically Signed   By: Claudie Revering M.D.   On: 05/07/2017 18:47   Dg Fluoro Guide Cv Line-no Report  Result Date: 05/07/2017 Fluoroscopy was utilized by the requesting physician.  No radiographic interpretation.    Anti-infectives: Anti-infectives (From admission, onward)   Start     Dose/Rate Route Frequency Ordered Stop   05/07/17 2200  hydroxychloroquine (PLAQUENIL) tablet 200 mg     200 mg Oral 2 times daily 05/07/17 1650     05/07/17 2200  ceFAZolin (ANCEF) IVPB 2g/100 mL premix     2 g 200 mL/hr over 30 Minutes Intravenous Every 8 hours 05/07/17 1650 05/07/17 2212   05/07/17 1330  ceFAZolin (ANCEF) 3 g in dextrose 5 % 50 mL IVPB     3 g 130 mL/hr over 30 Minutes Intravenous To [facility] 05/06/17 1022 05/07/17 1430      Assessment/Plan: s/p Procedure(s): RIGHT SIMPLE MASTECTOMY WITH AXILLARY SENTINEL NODE BIOPSY (Right) INSERTION PORT-A-CATH (Right) Ambulate  SL IV  Home after lunch as long as CBC OK RESTART COUMADIN ON Friday Needs to follow up with PCP next week   LOS: 0 days    Tara Cox A Tara Cox 05/08/2017

## 2017-05-08 NOTE — Progress Notes (Signed)
Nutrition Follow-up:  Received email from patient's daughter Tara Cox regarding nutrition recommendations following surgery.    Patient with breast cancer and s/p mastectomy yesterday.  Planning herceptin.    Medications: reviewed  Labs: reviewed  Anthropometrics:   Height: 67 inches Weight: 241 lb BMI: 37   Estimated Energy Needs  Kcals: 2200-2700 calories/d Protein: 110-135 g/d Fluid: > 2.2 L/d  NUTRITION DIAGNOSIS: food and nutrition related knowledge deficit improved   MALNUTRITION DIAGNOSIS: none at this time   INTERVENTION:   Provided daughter with patient goal for calories, protein and general guide for carbohydrate per daughters request via email. Daughter concerned about foods that will increase gout flare and handout from AND provided to daughter via email. Also answered daughter's questions regarding plant based dairy products.   Offered to speak with daughter via phone (daughter lives in [place]) if needed.      MONITORING, EVALUATION, GOAL: weight trends, intake   NEXT VISIT: as needed  Kalan Yeley B. Zenia Resides, [place], [place] Registered Dietitian [phone] (pager)

## 2017-05-08 NOTE — Discharge Summary (Signed)
Physician Discharge Summary  Patient ID: Tara Cox MRN: [id number] DOB/AGE: 1934-03-17 83 y.o.  Admit date: 05/07/2017 Discharge date: 05/08/2017  Admission Diagnoses:  Discharge Diagnoses:  Active Problems:   Breast cancer, stage 2, right [facility])   Discharged Condition: good  Hospital Course: Pt did well. Pt had good pain control, tolerated her diet and had no wound issues and stable vital signs.    Consults: None    Treatments: surgery: RIGHT MASTECTOMY AND SLN  MAPPING WITH PORT PLACEMENT   Discharge Exam: Blood pressure 115/60, pulse 78, temperature 97.7 F (36.5 C), temperature source Oral, resp. rate 17, height 5\' 7"  (1.702 m), weight 109.5 kg (241 lb 8 oz), SpO2 97 %. Resp: clear to auscultation bilaterally Cardio: regular rate and rhythm, S1, S2 normal, no murmur, click, rub or gallop Incision/Wound:PORT WOUNDS CDI no hematoma Flaps viable  No hematoma drainage appears serous   Disposition: home       Signed: Joyice Faster Alice Burnside 05/08/2017, 7:51 AM

## 2017-05-21 NOTE — Progress Notes (Signed)
Tara Cox  [phone] Fax:([phone]) [phone]     ID: Tara Cox DOB: 1941/09/03  MR#: [id number]  QAS#:[id number]  Patient Care Team: [name], MD as PCP - General (Family Medicine) Hubert Raatz, Virgie Dad, MD as Consulting Physician (Oncology) Erroll Luna, MD as Consulting Physician (General Surgery) Kyung Rudd, MD as Consulting Physician (Radiation Oncology) Vickey Huger, MD as Consulting Physician (Orthopedic Surgery) Bo Merino, MD as Consulting Physician (Rheumatology) Lorretta Harp, MD as Consulting Physician (Cardiology) OTHER MD:   CHIEF COMPLAINT: Triple positive breast cancer  CURRENT TREATMENT: Anastrozole, trastuzumab   HISTORY OF CURRENT ILLNESS: From the original intake note:  Tara Cox had mammography at the breast center October 11, 2011 showing calcifications in the upper right breast.  Six-month follow-up was suggested, but  the patient did not follow-up.  More recently she noticed nipple inversion on the right breast and followed this without reporting it over the last several months. She did undergo bilateral diagnostic mammography with tomography and right breast ultrasonography at [facility] on 03/26/2017 showing: breast density category B. There is a highly suspicious right retroareolar mass  measuring approximately 2.3 x 1.5 x 2.8 cm  with associated calcifications, together the mass and calcifications measure up to 3.3 cm mammographically. There are 2 lymph nodes in the right axilla with borderline thickened cortices, 1 of which measures 1 x 0.9 x 0.6 cm with a cortex thickness of 0.4 mm.  Accordingly on 03/29/2017 she proceeded to biopsy of the right breast and one axillary lymph node.. The pathology from this procedure showed ([id number]): Invasive lobular carcinoma,grade II, E-cadherin negative. The right axillary lymph node was negative for carcinoma. Prognostic indicators significant for: estrogen  receptor, 100% positive and progesterone receptor, 100% positive, both with strong staining intensity. Proliferation marker Ki67 at 25%. HER2 amplified with ratios  HER2/CEP17 signals 2.22 and average HER2 copies per cell 3.00  The patient's subsequent history is as detailed below.  INTERVAL HISTORY: Tara Cox returns today for follow up and treatment of her triple positive breast cancer accompanied by her husband. Since her last visit, she underwent a right mastectomy and sentinel lymph node sampling ([id number]) on 04/30/ 2019 showing: Invasive and in situ lobular carcinoma, grade III, measuring 5.8 cm in greatest extent. Nipple and retroaereolar involvement with extension into the upper outer and lower outer quadrants. Separate focus of DCIS, 0.7 cm high grade with necrosis involving the upper outer quadrant. Lymphovascular invasion is present; perineural invasion not identified. All resection margins are negative for invasive and in situ carcinoma. Out of 4 total right axillary sentinel lymph nodes, 2 lymph nodes were positive for metastatic carcinoma. The other 2 lymph nodes showed isolated tumor cells.    REVIEW OF SYSTEMS: Allyana reports that she did well with surgery. She feels a constant but mild pain and burning sensation in the right axilla. She was initially given a nerve block during surgery. She takes hydrocodone for the pain. Her drains were removed on 05/13/2017.  She denies unusual headaches, visual changes, nausea, vomiting, or dizziness. There has been no unusual cough, phlegm production, or pleurisy. This been no change in bowel or bladder habits. She denies unexplained fatigue or unexplained weight loss, bleeding, rash, or fever. A detailed review of systems was otherwise stable.    PAST MEDICAL HISTORY: Past Medical History:  Diagnosis Date  . A-fib ([place])   . Arthritis   . 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 DDD (degenerative disc disease), lumbar 12/08/2015  S/P discectomy     . Diaphragmatic hernia 12/08/2015  . Dysrhythmia    A-Fib  . Edema of lower extremity 06/29/09   Lower Venous Exam- normal. No evidence of thrombus or thrombophlebitis.  . Esophagitis 12/08/2015  . Frequent urination at night   . GERD (gastroesophageal reflux disease)   . Hemorrhoids   . History of calcium pyrophosphate deposition disease (CPPD) 12/08/2015  . History of miscarriage 12/08/2015   x3  . History of total right knee replacement 12/08/2015  . Hypertension 02/24/09   Echo-EF 62%; Myocardial Perfusion Study-Normal. No signifcant ischemia noted.  . IBS (irritable bowel syndrome)   . Migraine   . Osteoarthritis of both feet 12/08/2015  . Osteoarthritis of both hands 12/08/2015  . PONV (postoperative nausea and vomiting)   . Uterine fibroid 12/08/2015    PAST SURGICAL HISTORY: Past Surgical History:  Procedure Laterality Date  . ABDOMINAL HYSTERECTOMY  1974  . APPENDECTOMY    . BACK SURGERY  2001  . BUNIONECTOMY  1986  . CESAREAN SECTION  1971  . COLONOSCOPY    . COLONOSCOPY WITH PROPOFOL N/A 05/28/2014   Procedure: COLONOSCOPY WITH PROPOFOL;  Surgeon: Carol Ada, MD;  Location: [facility];  Service: Endoscopy;  Laterality: N/A;  . ESOPHAGOGASTRODUODENOSCOPY (EGD) WITH PROPOFOL N/A 05/28/2014   Procedure: ESOPHAGOGASTRODUODENOSCOPY (EGD) WITH PROPOFOL;  Surgeon: Carol Ada, MD;  Location: [facility];  Service: Endoscopy;  Laterality: N/A;  . EXPLORATORY LAPAROTOMY     open procedure  . EYE SURGERY Bilateral    cataract removal  . PORTACATH PLACEMENT Right 05/07/2017   Procedure: INSERTION PORT-A-CATH;  Surgeon: Erroll Luna, MD;  Location: [facility];  Service: General;  Laterality: Right;  . SHOULDER SURGERY Right   . SIMPLE MASTECTOMY WITH AXILLARY SENTINEL NODE BIOPSY Right 05/07/2017   Procedure: RIGHT SIMPLE MASTECTOMY WITH AXILLARY SENTINEL NODE BIOPSY;  Surgeon: Erroll Luna, MD;  Location: [facility];  Service: General;  Laterality: Right;  . TOTAL KNEE  ARTHROPLASTY Right 01/26/2013   DR Ronnie Derby  . TOTAL KNEE ARTHROPLASTY Right 01/26/2013   Procedure: TOTAL KNEE ARTHROPLASTY;  Surgeon: Vickey Huger, MD;  Location: [facility];  Service: Orthopedics;  Laterality: Right;  . UTERINE FIBROID SURGERY  1967    FAMILY HISTORY Family History  Problem Relation Age of Onset  . Other Mother   . Varicose Veins Mother   . Emphysema Mother   . Heart disease Father        heart attack  . Diabetes Sister   . Other Sister        pacemaker  . Diabetes Sister   . Heart disease Sister   The patient's father died in 17 (around age 7) in [place] due to sudden death. The patient's mother died around age 8 due to emphysema. The patient was born in [place], [place]. The patient has 2 brothers and 3 sisters. One sister had a lumpectomy in 1991, but this was not malignant. Another sister died of pancreatic cancer. The patient denies a family history of breast or ovarian cancer.    GYNECOLOGIC HISTORY:  No LMP recorded. Patient has had a hysterectomy. Menarche: 83 years old Age at first live birth: 83 years old She is GXP1.  Her LMP was at age 74 when she underwent hysterectomy, without salpingo-oophorectomy. She was on Premarin for more than 20 years.  She never used oral contraceptives.   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 Her husband, Broadus John, used  to be in banking, and he is currently retired. The patient's biological daughter, Clarene Critchley, is a Health and safety inspector at a contact center in [place]. The patient's adopted daughter, Lenna Sciara, is an Social research officer, government in [place]. The patient's adopted son, Gaspar Bidding, is unemployed in [place]. The patient has 8 grandchildren, one of whom belongs to [place] and works as a Pharmacist, community.  The patient belongs to [place].      ADVANCED DIRECTIVES:    HEALTH MAINTENANCE: Social History   Tobacco Use  . Smoking status: Former Smoker     Packs/day: 1.00    Years: 20.00    Pack years: 20.00    Types: Cigarettes    Last attempt to quit: 02/19/1990    Years since quitting: 27.2  . Smokeless tobacco: Never Used  . Tobacco comment: ' i QUIT SMOKING MANY YEARS AGO "  Substance Use Topics  . Alcohol use: No  . Drug use: No     Colonoscopy: Dr. Benson Norway 2016  PAP:  Bone density:   Allergies  Allergen Reactions  . Oxycodone Itching, Nausea And Vomiting and Other (See Comments)    Full body weakness   . Hydrocodone Other (See Comments)    Reports they make her feel sick/drowsy  . Percocet [Oxycodone-Acetaminophen] Itching    Pt says she can take tylenol   . Soma [Carisoprodol] Nausea And Vomiting  . Ultram [Tramadol Hcl] Nausea And Vomiting    Current Outpatient Medications  Medication Sig Dispense Refill  . acetaminophen (TYLENOL) 650 MG CR tablet Take 650 mg by mouth every 8 (eight) hours as needed for pain.    Marland Kitchen anastrozole (ARIMIDEX) 1 MG tablet Take 1 tablet (1 mg total) by mouth daily. 90 tablet 4  . Ascorbic Acid (VITAMIN C WITH ROSE HIPS) 500 MG tablet Take 500 mg by mouth daily.    Marland Kitchen atorvastatin (LIPITOR) 20 MG tablet Take 20 mg by mouth at bedtime.     . benzonatate (TESSALON) 200 MG capsule Take 200 mg by mouth 3 (three) times daily as needed for cough.    . Calcium Carbonate-Vit D-Min (CALCIUM 600+D3 PLUS MINERALS) 600-800 MG-UNIT TABS Take 1 tablet by mouth at bedtime.    . Cholecalciferol (VITAMIN D) 2000 units CAPS Take 2,000 Units by mouth daily.    . colchicine 0.6 MG tablet TAKE 1 TABLET DAILY BY  MOUTH. 90 tablet 1  . dextromethorphan (DELSYM) 30 MG/5ML liquid Take 30 mg by mouth daily as needed for cough.    . diphenhydrAMINE (BENADRYL) 25 mg capsule Take 25 mg by mouth every 6 (six) hours as needed for allergies.    . furosemide (LASIX) 20 MG tablet Take 20 mg by mouth daily.      Marland Kitchen gabapentin (NEURONTIN) 300 MG capsule Take 300 mg by mouth at bedtime.     Marland Kitchen HYDROcodone-acetaminophen (NORCO/VICODIN)  5-325 MG tablet Take 1 tablet by mouth every 6 (six) hours as needed for moderate pain. 20 tablet 0  . hydrocortisone (ANUSOL-HC) 2.5 % rectal cream Place 1 application rectally 2 (two) times daily as needed for hemorrhoids or itching.    . hydroxychloroquine (PLAQUENIL) 200 MG tablet TAKE 1 TABLET BY MOUTH TWO  TIMES DAILY 180 tablet 0  . losartan-hydrochlorothiazide (HYZAAR) 100-25 MG per tablet Take 1 tablet by mouth daily. IN THE MORNING.    . magnesium oxide (MAG-OX) 400 MG tablet Take 400 mg by mouth daily.     . metoprolol tartrate (LOPRESSOR) 25 MG tablet Take  25 mg by mouth daily with lunch.     . Multiple Vitamin (MULTIVITAMIN) tablet Take 1 tablet by mouth daily. CENTRUM    . NONFORMULARY OR COMPOUNDED ITEM Apply 1-2 g topically 4 (four) times daily as needed for pain. BACLOFEN-DOXEPIN-GABAPENTIN-TOPIRAMATE-PENTOXIFYLLINE  2  . omeprazole (PRILOSEC) 40 MG capsule Take 40 mg by mouth daily as needed (FOR ACID REFLUX/INDIGESTION).   12  . OVER THE COUNTER MEDICATION Take 600 mcg by mouth daily as needed (AT ONSET OF MIGRAINE). MYGRAFEW (TANACETUM PARTHENIUM)    . potassium chloride SA (K-DUR,KLOR-CON) 20 MEQ tablet Take 10 mEq by mouth daily.    Marland Kitchen triamcinolone cream (KENALOG) 0.1 % Apply 1 application topically 2 (two) times daily as needed (Vaginal Itching).    . warfarin (COUMADIN) 5 MG tablet Take 0.5-1 tablets (2.5-5 mg total) by mouth daily. Take 5 mg tablet daily or as directed by pharmacist from [facility] (Patient taking differently: Take 5 mg by mouth at bedtime. ) 40 tablet 1   No current facility-administered medications for this visit.     OBJECTIVE: Older African-American woman who appears stated age 31:   05/24/17 1037  BP: (!) 145/66  Pulse: 96  Resp: 18  Temp: 98.7 F (37.1 C)  SpO2: 96%     Body mass index is 37.01 kg/m.   Wt Readings from Last 3 Encounters:  05/24/17 236 lb 4.8 oz (107.2 kg)  05/07/17 241 lb 8 oz (109.5 kg)  04/29/17 241 lb 8 oz  (109.5 kg)      ECOG FS:1 - Symptomatic but completely ambulatory  Sclerae unicteric, EOMs intact Oropharynx clear and moist No cervical or supraclavicular adenopathy Lungs no rales or rhonchi Heart regular rate and rhythm Abd soft, nontender, positive bowel sounds MSK no focal spinal tenderness, no upper extremity lymphedema Neuro: nonfocal, well oriented, appropriate affect Breasts: Status post right mastectomy, with the incision healing very nicely, no dehiscence, erythema, or swelling.  The right axilla is benign.  LAB RESULTS:  CMP     Component Value Date/Time   NA 140 05/08/2017 0310   NA 142 09/22/2015   K 4.5 05/08/2017 0310   CL 105 05/08/2017 0310   CO2 27 05/08/2017 0310   GLUCOSE 201 (H) 05/08/2017 0310   BUN 12 05/08/2017 0310   BUN 19 09/22/2015   CREATININE 1.09 (H) 05/08/2017 0310   CREATININE 0.91 (H) 10/11/2016 1136   CALCIUM 9.0 05/08/2017 0310   PROT 5.9 (L) 05/08/2017 0310   ALBUMIN 3.4 (L) 05/08/2017 0310   AST 38 05/08/2017 0310   ALT 42 05/08/2017 0310   ALKPHOS 73 05/08/2017 0310   BILITOT 0.5 05/08/2017 0310   GFRNONAA 47 (L) 05/08/2017 0310   GFRNONAA 60 10/11/2016 1136   GFRAA 54 (L) 05/08/2017 0310   GFRAA 69 10/11/2016 1136    No results found for: TOTALPROTELP, ALBUMINELP, A1GS, A2GS, BETS, BETA2SER, GAMS, MSPIKE, SPEI  No results found for: KPAFRELGTCHN, LAMBDASER, KAPLAMBRATIO  Lab Results  Component Value Date   WBC 13.5 (H) 05/08/2017   NEUTROABS 1,485 (L) 10/11/2016   HGB 12.7 05/08/2017   HCT 39.8 05/08/2017   MCV 83.6 05/08/2017   PLT 171 05/08/2017    '@LASTCHEMISTRY' @  No results found for: LABCA2  No components found for: MHDQQI297  No results for input(s): INR in the last 168 hours.  No results found for: LABCA2  No results found for: LGX211  No results found for: HER740  No results found for: CXK481  No results found  for: CA2729  No components found for: HGQUANT  No results found for: CEA1 / No  results found for: CEA1   No results found for: AFPTUMOR  No results found for: CHROMOGRNA  No results found for: PSA1  No visits with results within 3 Day(s) from this visit.  Latest known visit with results is:  Admission on 05/07/2017, Discharged on 05/08/2017  Component Date Value Ref Range Status  . Sodium 05/07/2017 141  135 - 145 mmol/L Final  . Potassium 05/07/2017 3.5  3.5 - 5.1 mmol/L Final  . Chloride 05/07/2017 105  101 - 111 mmol/L Final  . CO2 05/07/2017 29  22 - 32 mmol/L Final  . Glucose, Bld 05/07/2017 126* 65 - 99 mg/dL Final  . BUN 05/07/2017 11  6 - 20 mg/dL Final  . Creatinine, Ser 05/07/2017 0.94  0.44 - 1.00 mg/dL Final  . Calcium 05/07/2017 9.5  8.9 - 10.3 mg/dL Final  . GFR calc non Af Amer 05/07/2017 56* >60 mL/min Final  . GFR calc Af Amer 05/07/2017 >60  >60 mL/min Final   Comment: (NOTE) The eGFR has been calculated using the CKD EPI equation. This calculation has not been validated in all clinical situations. eGFR's persistently <60 mL/min signify possible Chronic Kidney Disease.   Georgiann Hahn gap 05/07/2017 7  5 - 15 Final   Performed at [facility], [place] [address] [place], [place] [address]  . Prothrombin Time 05/07/2017 13.5  11.4 - 15.2 seconds Final  . INR 05/07/2017 1.04   Final   Performed at [facility] [address] [place], [place] [address]  . Sodium 05/08/2017 140  135 - 145 mmol/L Final  . Potassium 05/08/2017 4.5  3.5 - 5.1 mmol/L Final  . Chloride 05/08/2017 105  101 - 111 mmol/L Final  . CO2 05/08/2017 27  22 - 32 mmol/L Final  . Glucose, Bld 05/08/2017 201* 65 - 99 mg/dL Final  . BUN 05/08/2017 12  6 - 20 mg/dL Final  . Creatinine, Ser 05/08/2017 1.09* 0.44 - 1.00 mg/dL Final  . Calcium 05/08/2017 9.0  8.9 - 10.3 mg/dL Final  . Total Protein 05/08/2017 5.9* 6.5 - 8.1 g/dL Final  . Albumin 05/08/2017 3.4* 3.5 - 5.0 g/dL Final  . AST 05/08/2017 38  15 - 41 U/L Final  . ALT 05/08/2017 42  14 - 54 U/L Final  .  Alkaline Phosphatase 05/08/2017 73  38 - 126 U/L Final  . Total Bilirubin 05/08/2017 0.5  0.3 - 1.2 mg/dL Final  . GFR calc non Af Amer 05/08/2017 47* >60 mL/min Final  . GFR calc Af Amer 05/08/2017 54* >60 mL/min Final   Comment: (NOTE) The eGFR has been calculated using the CKD EPI equation. This calculation has not been validated in all clinical situations. eGFR's persistently <60 mL/min signify possible Chronic Kidney Disease.   Georgiann Hahn gap 05/08/2017 8  5 - 15 Final   Performed at [facility], [place] [address], [place], [place] [address]  . WBC 05/08/2017 13.5* 4.0 - 10.5 K/uL Final  . RBC 05/08/2017 4.76  3.87 - 5.11 MIL/uL Final  . Hemoglobin 05/08/2017 12.7  12.0 - 15.0 g/dL Final  . HCT 05/08/2017 39.8  36.0 - 46.0 % Final  . MCV 05/08/2017 83.6  78.0 - 100.0 fL Final  . MCH 05/08/2017 26.7  26.0 - 34.0 pg Final  . MCHC 05/08/2017 31.9  30.0 - 36.0 g/dL Final  . RDW 05/08/2017 14.5  11.5 - 15.5 % Final  . Platelets 05/08/2017 171  150 - 400 K/uL Final   Performed at [facility], [place] [address], [place], [place] [address]    (this displays the last labs from the last 3 days)  No results found for: TOTALPROTELP, ALBUMINELP, A1GS, A2GS, BETS, BETA2SER, GAMS, MSPIKE, SPEI (this displays SPEP labs)  No results found for: KPAFRELGTCHN, LAMBDASER, KAPLAMBRATIO (kappa/lambda light chains)  No results found for: HGBA, HGBA2QUANT, HGBFQUANT, HGBSQUAN (Hemoglobinopathy evaluation)   Lab Results  Component Value Date   LDH 162 10/22/2006    No results found for: IRON, TIBC, IRONPCTSAT (Iron and TIBC)  No results found for: FERRITIN  Urinalysis    Component Value Date/Time   COLORURINE YELLOW 09/27/2015 [place] 09/27/2015 1532   LABSPEC 1.015 09/27/2015 1532   PHURINE 6.5 09/27/2015 1532   GLUCOSEU NEGATIVE 09/27/2015 1532   HGBUR TRACE (A) 09/27/2015 1532   BILIRUBINUR NEGATIVE 09/27/2015 1532   [place] 09/27/2015 1532    PROTEINUR NEGATIVE 09/27/2015 1532   UROBILINOGEN 0.2 01/19/2013 0954   NITRITE NEGATIVE 09/27/2015 1532   LEUKOCYTESUR NEGATIVE 09/27/2015 1532     STUDIES: Nm Sentinel Node Inj-no Rpt (breast)  Result Date: 05/07/2017 Sulfur colloid was injected by the nuclear medicine technologist for melanoma sentinel node.   Dg Chest Port 1 View  Result Date: 05/07/2017 CLINICAL DATA:  Status post porta catheter placement. EXAM: PORTABLE CHEST 1 VIEW COMPARISON:  01/19/2013. FINDINGS: Interval mildly enlarged cardiac silhouette. Interval right jugular porta catheter with its tip in the superior vena cava near the cavoatrial junction. No pneumothorax. Clear lungs with mildly prominent interstitial markings. Interval surgical clips overlying the right hilum. Mild subcutaneous emphysema on the right. Thoracic spine degenerative changes. IMPRESSION: 1. Right porta catheter tip in the superior vena cava that pneumothorax. 2. Interval mild cardiomegaly. 3. Mild chronic interstitial lung disease. 4. Mild subcutaneous emphysema on the right. Electronically Signed   By: Claudie Revering M.D.   On: 05/07/2017 18:47   Dg Fluoro Guide Cv Line-no Report  Result Date: 05/07/2017 Fluoroscopy was utilized by the requesting physician.  No radiographic interpretation.    ELIGIBLE FOR AVAILABLE RESEARCH PROTOCOL: Exact sciences study  ASSESSMENT: 83 y.o. [place] woman status post central right breast biopsy 03/29/2017 for a clinical T1c N0, stage IA invasive lobular carcinoma, grade 2, strongly estrogen and progesterone receptor positive, also HER-2 amplified, with an MIB-1 of 25%  (1) status post right mastectomy with sentinel lymph node sampling 05/07/2017 for a pT3 pN1, stage IIB invasive lobular carcinoma, grade 3, with negative margins.  (2) consider adjuvant radiation given size of tumor and positive nodes  (3) trastuzumab adjuvantly for 1 year to start 06/04/2017  (a) echocardiogram 04/16/2017 shows an  ejection fraction of 60-65%.  (4) anastrozole started 05/28/2017  (a) bone density  PLAN: Tara Cox is recovering well from her mastectomy.  Initially we had thought she would not need radiation postmastectomy, given her age and the fact that she is both ER and HER-2 positive.  However given the size of her tumor and the multiple node involvement, I think radiation should at least be considered.  My vote would be for it.  I am requesting an appointment with Dr. Lisbeth Renshaw for next week to make a decision regarding that.  I am going to go ahead and start anastrozole now.  I put the order in.  I discussed the possible toxicity side effects and complications with her.  She should  be receiving the drugs through [facility] next week and will start as soon as she receives them.  We then discussed trastuzumab.  She will start that on 06/04/2017.  She has a good understanding of the possible toxicities, side effects and complications of that agent.  She has a very favorable baseline echocardiogram and we are going to repeat one after she has had to trastuzumab doses just to make sure there is no unusual sensitivity.  After that we will go back to echoes every 3 months as we normally do  We are not planning on chemotherapy at this time  She will meet with our chemotherapy teaching nurse today.  She will return to see [place] with the second dose of trastuzumab.  Have encouraged her to call us with any problems that may develop before the next visit. Clarice Zulauf, Virgie Dad, MD  05/24/17 10:57 AM Medical Oncology and Hematology [facility] [address] [place], [place] [address] Tel. [phone]    Fax. [phone]  This document serves as a record of services personally performed by Lurline Del, MD. It was created on his behalf by Sheron Nightingale, a trained medical scribe. The creation of this record is based on the scribe's personal observations and the provider's statements to them.   I have  reviewed the above documentation for accuracy and completeness, and I agree with the above.

## 2017-05-23 ENCOUNTER — Other Ambulatory Visit: Payer: Self-pay | Admitting: Rheumatology

## 2017-05-23 NOTE — Telephone Encounter (Signed)
ok 

## 2017-05-23 NOTE — Telephone Encounter (Signed)
Last Visit: 10/11/16 Next visit: was due April 2019. Message sent to front to schedule patient.  Labs: 05/08/17 Creat. 1.09 Previously 0.94 Elevated glucose, GFR 54 previously >60 PLQ Eye Exam: 06/2016 WNL  Okay to refill PLQ?

## 2017-05-24 ENCOUNTER — Inpatient Hospital Stay: Payer: Medicare Other | Attending: Oncology | Admitting: Oncology

## 2017-05-24 ENCOUNTER — Telehealth: Payer: Self-pay | Admitting: Oncology

## 2017-05-24 ENCOUNTER — Inpatient Hospital Stay: Payer: Medicare Other

## 2017-05-24 VITALS — BP 145/66 | HR 96 | Temp 98.7°F | Resp 18 | Ht 67.0 in | Wt 236.3 lb

## 2017-05-24 DIAGNOSIS — Z9011 Acquired absence of right breast and nipple: Secondary | ICD-10-CM | POA: Diagnosis not present

## 2017-05-24 DIAGNOSIS — C50111 Malignant neoplasm of central portion of right female breast: Secondary | ICD-10-CM | POA: Diagnosis not present

## 2017-05-24 DIAGNOSIS — C50911 Malignant neoplasm of unspecified site of right female breast: Secondary | ICD-10-CM

## 2017-05-24 DIAGNOSIS — Z170 Estrogen receptor positive status [ER+]: Secondary | ICD-10-CM | POA: Diagnosis not present

## 2017-05-24 MED ORDER — ANASTROZOLE 1 MG PO TABS: 1.0000 mg | ORAL_TABLET | Freq: Every day | ORAL | 4 refills | Status: DC

## 2017-05-24 MED ORDER — LIDOCAINE-PRILOCAINE 2.5-2.5 % EX CREA: TOPICAL_CREAM | Freq: Once | CUTANEOUS | Status: DC

## 2017-05-24 NOTE — Telephone Encounter (Signed)
Gave patient AVs and calendar of upcoming may through august appointments. Patient scheduled 6/4 due to avail in treatment area. Provider aware of update.

## 2017-05-28 ENCOUNTER — Telehealth: Payer: Self-pay | Admitting: *Deleted

## 2017-05-28 ENCOUNTER — Telehealth: Payer: Self-pay | Admitting: Rheumatology

## 2017-05-28 ENCOUNTER — Other Ambulatory Visit: Payer: Self-pay | Admitting: *Deleted

## 2017-05-28 MED ORDER — LIDOCAINE-PRILOCAINE 2.5-2.5 % EX CREA: 1.0000 "application " | TOPICAL_CREAM | CUTANEOUS | 0 refills | Status: DC | PRN

## 2017-05-28 NOTE — Telephone Encounter (Signed)
Patient states she starts her chemo treatment next week and states that she is still taking her Hydroxychloroquine and Colchicine.  Patient states she currently has enough medication and doesn't need a refill of either prescriptions at this time.  Patient states she will talk to her oncologist, Dr. Jana Hakim about continuing the medication while she undergoes her treatment.  Patient will schedule a follow-up appointment with Dr. Estanislado Pandy once her treatments have ended.

## 2017-05-31 ENCOUNTER — Other Ambulatory Visit: Payer: Self-pay | Admitting: Oncology

## 2017-06-06 ENCOUNTER — Encounter: Payer: Self-pay | Admitting: Physical Therapy

## 2017-06-06 ENCOUNTER — Other Ambulatory Visit: Payer: Self-pay

## 2017-06-06 ENCOUNTER — Ambulatory Visit: Payer: Medicare Other | Attending: Surgery | Admitting: Physical Therapy

## 2017-06-06 DIAGNOSIS — C50111 Malignant neoplasm of central portion of right female breast: Secondary | ICD-10-CM | POA: Insufficient documentation

## 2017-06-06 DIAGNOSIS — M25611 Stiffness of right shoulder, not elsewhere classified: Secondary | ICD-10-CM | POA: Diagnosis present

## 2017-06-06 DIAGNOSIS — Z483 Aftercare following surgery for neoplasm: Secondary | ICD-10-CM | POA: Diagnosis present

## 2017-06-06 DIAGNOSIS — R293 Abnormal posture: Secondary | ICD-10-CM | POA: Diagnosis present

## 2017-06-06 DIAGNOSIS — Z170 Estrogen receptor positive status [ER+]: Secondary | ICD-10-CM | POA: Insufficient documentation

## 2017-06-06 NOTE — Therapy (Signed)
[place], [place], [address] Phone: [phone]   Fax:  [phone]  Physical Therapy Treatment  Patient Details  Name: VIRGIL SLINGER MRN: [id number] Date of Birth: 07/12/1934 Referring Provider: Dr. Erroll Luna   Encounter Date: 06/06/2017  PT End of Session - 06/06/17 1011    Visit Number  2    Number of Visits  10    Date for PT Re-Evaluation  07/04/17    PT Start Time  0930    PT Stop Time  1015    PT Time Calculation (min)  45 min    Activity Tolerance  Patient tolerated treatment well    Behavior During Therapy  [facility] for tasks assessed/performed       Past Medical History:  Diagnosis Date  . A-fib ([place])   . Arthritis   . 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 DDD (degenerative disc disease), lumbar 12/08/2015   S/P discectomy   . Diaphragmatic hernia 12/08/2015  . Dysrhythmia    A-Fib  . Edema of lower extremity 06/29/09   Lower Venous Exam- normal. No evidence of thrombus or thrombophlebitis.  . Esophagitis 12/08/2015  . Frequent urination at night   . GERD (gastroesophageal reflux disease)   . Hemorrhoids   . History of calcium pyrophosphate deposition disease (CPPD) 12/08/2015  . History of miscarriage 12/08/2015   x3  . History of total right knee replacement 12/08/2015  . Hypertension 02/24/09   Echo-EF 62%; Myocardial Perfusion Study-Normal. No signifcant ischemia noted.  . IBS (irritable bowel syndrome)   . Migraine   . Osteoarthritis of both feet 12/08/2015  . Osteoarthritis of both hands 12/08/2015  . PONV (postoperative nausea and vomiting)   . Uterine fibroid 12/08/2015    Past Surgical History:  Procedure Laterality Date  . ABDOMINAL HYSTERECTOMY  1974  . APPENDECTOMY    . BACK SURGERY  2001  . BUNIONECTOMY  1986  . CESAREAN SECTION  1971  . COLONOSCOPY    . COLONOSCOPY WITH PROPOFOL N/A 05/28/2014   Procedure: COLONOSCOPY WITH PROPOFOL;  Surgeon: Carol Ada, MD;  Location: [facility];  Service: Endoscopy;  Laterality: N/A;  . ESOPHAGOGASTRODUODENOSCOPY (EGD) WITH PROPOFOL N/A 05/28/2014   Procedure: ESOPHAGOGASTRODUODENOSCOPY (EGD) WITH PROPOFOL;  Surgeon: Carol Ada, MD;  Location: [facility];  Service: Endoscopy;  Laterality: N/A;  . EXPLORATORY LAPAROTOMY     open procedure  . EYE SURGERY Bilateral    cataract removal  . PORTACATH PLACEMENT Right 05/07/2017   Procedure: INSERTION PORT-A-CATH;  Surgeon: Erroll Luna, MD;  Location: [facility];  Service: General;  Laterality: Right;  . SHOULDER SURGERY Right   . SIMPLE MASTECTOMY WITH AXILLARY SENTINEL NODE BIOPSY Right 05/07/2017   Procedure: RIGHT SIMPLE MASTECTOMY WITH AXILLARY SENTINEL NODE BIOPSY;  Surgeon: Erroll Luna, MD;  Location: [facility];  Service: General;  Laterality: Right;  . TOTAL KNEE ARTHROPLASTY Right 01/26/2013   DR Ronnie Derby  . TOTAL KNEE ARTHROPLASTY Right 01/26/2013   Procedure: TOTAL KNEE ARTHROPLASTY;  Surgeon: Vickey Huger, MD;  Location: [facility];  Service: Orthopedics;  Laterality: Right;  . UTERINE FIBROID SURGERY  1967    There were no vitals filed for this visit.  Subjective Assessment - 06/06/17 0934    Subjective  Patient reports she underwent a right mastectomy and sentinel node biopsy on 05/07/17 with 2/4 positive nodes. She has concerns about a new lump under her right arm but sees her surgeon tomorrow. She will undergo Herceptin treatments and has a  port placed. She has not yet met with her radiation oncologist but is planning to have radiation.    Pertinent History  Right mastectomy and sentinel node biopsy (2/4 positive nodes) on 05/07/17. Port placed for Herceptin. Right rotator cuff repair 3/18. Lumbar discectomy 2001 and right TKR 2014.    Patient Stated Goals  See if my arm is ok    Currently in Pain?  No/denies         [facility] PT Assessment - 06/06/17 0001      Assessment   Medical Diagnosis  s/p right mastectomy and SLNB    Referring Provider   Dr. Marcello Moores [place]    Onset Date/Surgical Date  05/07/17    Hand Dominance  Right    Prior Therapy  Baselines      Precautions   Precautions  Other (comment)    Precaution Comments  recent mastectomy; right arm lymphedema risk      Restrictions   Weight Bearing Restrictions  No      Balance Screen   Has the patient fallen in the past 6 months  No    Has the patient had a decrease in activity level because of a fear of falling?   No    Is the patient reluctant to leave their home because of a fear of falling?   No      Home Film/video editor residence    Living Arrangements  Spouse/significant other    Available Help at Discharge  Family      Prior Function   Level of [place]  Retired    Leisure  She rides a bike or does the UBE 3x/week for 30 minutes; not exercising since surgery      Cognition   Overall Cognitive Status  Within Functional Limits for tasks assessed      Observation/Other Assessments   Observations  Incisions appear to be healing very well. There is a palpable lump present just lateral to her mastectomy incision. She will discuss with surgeon tomorrow.      Posture/Postural Control   Posture/Postural Control  Postural limitations    Postural Limitations  Rounded Shoulders;Forward head      ROM / Strength   AROM / PROM / Strength  AROM      AROM   AROM Assessment Site  Shoulder    Right/Left Shoulder  Right    Right Shoulder Extension  52 Degrees    Right Shoulder Flexion  109 Degrees    Right Shoulder ABduction  94 Degrees    Right Shoulder Internal Rotation  51 Degrees    Right Shoulder External Rotation  73 Degrees        LYMPHEDEMA/ONCOLOGY QUESTIONNAIRE - 06/06/17 0943      Type   Cancer Type  Right breast cancer      Surgeries   Mastectomy Date  05/07/17    Sentinel Lymph Node Biopsy Date  05/07/17    Number Lymph Nodes Removed  4      Treatment   Active Chemotherapy Treatment  No     Past Chemotherapy Treatment  No    Active Radiation Treatment  No    Past Radiation Treatment  No    Current Hormone Treatment  Yes    Drug Name  Anastrozole    Past Hormone Therapy  No      What other symptoms do you have   Are you Having Heaviness or  Tightness  No    Are you having Pain  No    Are you having pitting edema  No    Is it Hard or Difficult finding clothes that fit  No    Do you have infections  No    Is there Decreased scar mobility  Yes    [place] Sign  No      Lymphedema Assessments   Lymphedema Assessments  Upper extremities      Right Upper Extremity Lymphedema   10 cm Proximal to Olecranon Process  33.5 cm    Olecranon Process  28.4 cm    10 cm Proximal to Ulnar Styloid Process  24.3 cm    Just Proximal to Ulnar Styloid Process  17.9 cm    Across Hand at [employer]  19 cm    At [place] of 2nd Digit  6.8 cm      Left Upper Extremity Lymphedema   10 cm Proximal to Olecranon Process  35.2 cm    Olecranon Process  28.4 cm    10 cm Proximal to Ulnar Styloid Process  23.6 cm    Just Proximal to Ulnar Styloid Process  17.6 cm    Across Hand at [employer]  18.2 cm    At [place] of 2nd Digit  6.7 cm        Quick Dash - 06/06/17 0001    Open a tight or new jar  Moderate difficulty    Do heavy household chores (wash walls, wash floors)  Moderate difficulty    Carry a shopping bag or briefcase  Mild difficulty    Wash your back  Mild difficulty    Use a knife to cut food  Mild difficulty    Recreational activities in which you take some force or impact through your arm, shoulder, or hand (golf, hammering, tennis)  Unable    During the past week, to what extent has your arm, shoulder or hand problem interfered with your normal social activities with family, friends, neighbors, or groups?  Slightly    During the past week, to what extent has your arm, shoulder or hand problem limited your work or other regular daily activities  Slightly    Arm, shoulder,  or hand pain.  Mild    Tingling (pins and needles) in your arm, shoulder, or hand  None    Difficulty Sleeping  Mild difficulty    DASH Score  34.09 %                     PT Education - 06/06/17 1006    Education provided  Yes    Education Details  Reviewed HEP    Person(s) Educated  Patient    Methods  Explanation;Demonstration;Handout    Comprehension  Verbalized understanding;Returned demonstration          PT Long Term Goals - 06/06/17 1238      PT LONG TERM GOAL #1   Title  Patient will demonstrate she has returned to baseline related to shoulder ROM and function post operatively.    Time  4    Period  Weeks    Status  On-going      PT LONG TERM GOAL #2   Title  Patient will improve DASH score to be </= 10 for improved shoulder function.    Time  4    Period  Weeks    Status  New      PT LONG  TERM GOAL #3   Title  Increase left shoulder flexion to >/= 145 degrees for increased ease reaching overhead.    Time  4    Period  Weeks    Status  New      PT LONG TERM GOAL #4   Title  Increase left shoulder abduction to >/= 135 degrees for increased ease reaching overhead.    Time  4    Period  Weeks    Status  New      PT LONG TERM GOAL #5   Title  Patient will verbalize good understanding of lymphedema risk reduction practices.    Time  4    Period  Weeks    Status  New      Breast Clinic Goals - 04/10/17 1208      Patient will be able to verbalize understanding of pertinent lymphedema risk reduction practices relevant to her diagnosis specifically related to skin care.   Time  1    Period  Days    Status  Achieved      Patient will be able to return demonstrate and/or verbalize understanding of the post-op home exercise program related to regaining shoulder range of motion.   Time  1    Period  Days    Status  Achieved      Patient will be able to verbalize understanding of the importance of attending the postoperative After Breast Cancer  Class for further lymphedema risk reduction education and therapeutic exercise.   Time  1    Period  Days    Status  Achieved           Plan - 06/06/17 1014    Clinical Impression Statement  Patient is s/p right mastectomy and SLNB with 2/4 positive nodes on 05/07/17. She had a port-a-cath placed and will begin Herceptin and radiation. She has limited shoulder ROM which would benefit from PT. She also has a new (per her report) palpable lump present just lateral to her mastectomy incision. She sees her surgeon tomorrow and will discuss that with him.    Rehab Potential  Excellent    Clinical Impairments Affecting Rehab Potential  Previous rotator cuff repair on right side    PT Frequency  2x / week    PT Duration  4 weeks    PT Treatment/Interventions  ADLs/Self Care Home Management;Therapeutic exercise;Patient/family education;Manual techniques;Passive range of motion;Therapeutic activities    PT Next Visit Plan  Begin PROM and ROM exercises    PT Home Exercise Plan  Post op shoulder ROM HEP    Consulted and Agree with Plan of Care  Patient       Patient will benefit from skilled therapeutic intervention in order to improve the following deficits and impairments:  Pain, Decreased range of motion, Decreased knowledge of precautions, Impaired UE functional use, Postural dysfunction  Visit Diagnosis: Malignant neoplasm of central portion of right breast in female, estrogen receptor positive ([place]) - Plan: PT plan of care cert/re-cert  Abnormal posture - Plan: PT plan of care cert/re-cert  Stiffness of right shoulder, not elsewhere classified - Plan: PT plan of care cert/re-cert  Aftercare following surgery for neoplasm - Plan: PT plan of care cert/re-cert     Problem List Patient Active Problem List   Diagnosis Date Noted  . Breast cancer, stage 2, right ([place]) 05/07/2017  . Malignant neoplasm of central portion of right breast in female, estrogen receptor positive ([place])  04/02/2017  . Primary insomnia 09/27/2016  .  ANA positive 09/27/2016  . Complete tear of right rotator cuff 05/11/2016  . High risk medication use 05/07/2016  . Uterine fibroid 12/08/2015  . History of miscarriage 12/08/2015  . Esophagitis 12/08/2015  . Diaphragmatic hernia 12/08/2015  . Calcium pyrophosphate deposition disease 12/08/2015  . Osteoarthritis of both hands 12/08/2015  . Osteoarthritis of both feet 12/08/2015  . History of total right knee replacement 12/08/2015  . Chondromalacia of both patellae 12/08/2015  . DDD (degenerative disc disease), lumbar 12/08/2015  . Essential hypertension 06/26/2013  . Paroxysmal atrial fibrillation ([place]) 06/26/2013  . Hyperlipidemia 06/26/2013  . DJD (degenerative joint disease) of knee 01/26/2013  . Hemorrhoids 07/11/2010  . IBS (irritable bowel syndrome) 07/11/2010  . Fatigue/loss of sleep 07/11/2010  . Night sweats 07/11/2010  . Chills 07/11/2010  . Weight gain 07/11/2010  . Inflammatory arthritis 07/11/2010  . Incontinence of urine 07/11/2010  . Thrombosed external hemorrhoids 07/11/2010    Annia Friendly, PT 06/06/17 12:40 PM  [place], [place], [address] Phone: [phone]   Fax:  [phone]  Name: DANIESHA DRIVER MRN: [id number] Date of Birth: [birth year]

## 2017-06-07 NOTE — Progress Notes (Signed)
Office Visit Note  Patient: Tara Cox             Date of Birth: 1934-12-20           MRN: [id number]             PCP: [name], MD Referring: [name], MD Visit Date: 06/12/2017 Occupation: @GUAROCC @    Subjective:  Left knee pain   History of Present Illness: Tara Cox is a 83 y.o. female with inflammatory arthritis, CPPD, osteoarthritis, and DDD.  She takes Plaquenil 200 mg 1 tablet daily.  She reports that she has noticed some improvement since starting on Plaquenil.  She has takes colchicine 0.6 mg daily but feels that her CPPD has been flaring more frequently.  She states she continues to try to avoid foods with purine and then.  She states that she has been having increased discomfort in her left knee and radiation of pain into her calf.  She states that her left knee seems to be swelling as well.  She denies any mechanical symptoms.  She states that she had x-rays performed at Dr. Jeoffrey Massed office.  She states her right knee replacement is doing well.  She states she is also having discomfort in her left fifth toe and has been having pain with certain shoes.  She reports that her hands have been doing well and denies any swelling.  She states that her ADLs have become easier.  She states she does have some stiffness and discomfort in her lower back at times.  She states she also has some discomfort in her left shoulder especially if she is laying on her left side at night.  She reports that she hopes to start going to the gym on a regular basis again.  She states that she has been cycling which is helping with her knee pain. She states that she was diagnosed with breast cancer and had a vasectomy at the end of April.  She states that she started her first Herceptin infusion yesterday.  She states that she was started on Anastrozole on 05/31/17.   Activities of Daily Living:  Patient reports morning stiffness for  30 minutes.   Patient Denies nocturnal pain.    Difficulty dressing/grooming: Denies Difficulty climbing stairs: Denies Difficulty getting out of chair: Reports Difficulty using hands for taps, buttons, cutlery, and/or writing: Denies   Review of Systems  Constitutional: Negative for fatigue.  HENT: Positive for mouth dryness. Negative for mouth sores and nose dryness.   Eyes: Positive for dryness. Negative for pain and visual disturbance.  Respiratory: Negative for cough, hemoptysis, shortness of breath and difficulty breathing.   Cardiovascular: Positive for irregular heartbeat (hx of atrial fib). Negative for chest pain, palpitations, hypertension and swelling in legs/feet.  Gastrointestinal: Negative for blood in stool, constipation and diarrhea.  Endocrine: Negative for increased urination.  Genitourinary: Negative for painful urination.  Musculoskeletal: Positive for arthralgias, joint pain, joint swelling and morning stiffness. Negative for myalgias, muscle weakness, muscle tenderness and myalgias.  Skin: Negative for color change, pallor, rash, hair loss, nodules/bumps, skin tightness, ulcers and sensitivity to sunlight.  Allergic/Immunologic: Negative for susceptible to infections.  Neurological: Negative for dizziness, numbness, headaches and weakness.  Hematological: Negative for swollen glands.  Psychiatric/Behavioral: Negative for depressed mood and sleep disturbance. The patient is not nervous/anxious.     PMFS History:  Patient Active Problem List   Diagnosis Date Noted  . Breast cancer, stage 2, right ([place])  05/07/2017  . Malignant neoplasm of central portion of right breast in female, estrogen receptor positive ([place]) 04/02/2017  . Primary insomnia 09/27/2016  . ANA positive 09/27/2016  . Complete tear of right rotator cuff 05/11/2016  . High risk medication use 05/07/2016  . Uterine fibroid 12/08/2015  . History of miscarriage 12/08/2015  . Esophagitis 12/08/2015  . Diaphragmatic hernia 12/08/2015  . Calcium  pyrophosphate deposition disease 12/08/2015  . Osteoarthritis of both hands 12/08/2015  . Osteoarthritis of both feet 12/08/2015  . History of total right knee replacement 12/08/2015  . Chondromalacia of both patellae 12/08/2015  . DDD (degenerative disc disease), lumbar 12/08/2015  . Essential hypertension 06/26/2013  . Paroxysmal atrial fibrillation ([place]) 06/26/2013  . Hyperlipidemia 06/26/2013  . DJD (degenerative joint disease) of knee 01/26/2013  . Hemorrhoids 07/11/2010  . IBS (irritable bowel syndrome) 07/11/2010  . Fatigue/loss of sleep 07/11/2010  . Night sweats 07/11/2010  . Chills 07/11/2010  . Weight gain 07/11/2010  . Inflammatory arthritis 07/11/2010  . Incontinence of urine 07/11/2010  . Thrombosed external hemorrhoids 07/11/2010    Past Medical History:  Diagnosis Date  . A-fib ([place])   . Arthritis   . 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 DDD (degenerative disc disease), lumbar 12/08/2015   S/P discectomy   . Diaphragmatic hernia 12/08/2015  . Dysrhythmia    A-Fib  . Edema of lower extremity 06/29/09   Lower Venous Exam- normal. No evidence of thrombus or thrombophlebitis.  . Esophagitis 12/08/2015  . Frequent urination at night   . GERD (gastroesophageal reflux disease)   . Hemorrhoids   . History of calcium pyrophosphate deposition disease (CPPD) 12/08/2015  . History of miscarriage 12/08/2015   x3  . History of total right knee replacement 12/08/2015  . Hypertension 02/24/09   Echo-EF 62%; Myocardial Perfusion Study-Normal. No signifcant ischemia noted.  . IBS (irritable bowel syndrome)   . Migraine   . Osteoarthritis of both feet 12/08/2015  . Osteoarthritis of both hands 12/08/2015  . PONV (postoperative nausea and vomiting)   . Uterine fibroid 12/08/2015    Family History  Problem Relation Age of Onset  . Other Mother   . Varicose Veins Mother   . Emphysema Mother   . Heart disease Father        heart attack  . Diabetes Sister   . Other  Sister        pacemaker  . Diabetes Sister   . Heart disease Sister    Past Surgical History:  Procedure Laterality Date  . ABDOMINAL HYSTERECTOMY  1974  . APPENDECTOMY    . BACK SURGERY  2001  . BUNIONECTOMY  1986  . CESAREAN SECTION  1971  . COLONOSCOPY    . COLONOSCOPY WITH PROPOFOL N/A 05/28/2014   Procedure: COLONOSCOPY WITH PROPOFOL;  Surgeon: Carol Ada, MD;  Location: [facility];  Service: Endoscopy;  Laterality: N/A;  . ESOPHAGOGASTRODUODENOSCOPY (EGD) WITH PROPOFOL N/A 05/28/2014   Procedure: ESOPHAGOGASTRODUODENOSCOPY (EGD) WITH PROPOFOL;  Surgeon: Carol Ada, MD;  Location: [facility];  Service: Endoscopy;  Laterality: N/A;  . EXPLORATORY LAPAROTOMY     open procedure  . EYE SURGERY Bilateral    cataract removal  . PORTACATH PLACEMENT Right 05/07/2017   Procedure: INSERTION PORT-A-CATH;  Surgeon: Erroll Luna, MD;  Location: [facility];  Service: General;  Laterality: Right;  . SHOULDER SURGERY Right   . SIMPLE MASTECTOMY WITH AXILLARY SENTINEL NODE BIOPSY Right 05/07/2017   Procedure: RIGHT SIMPLE MASTECTOMY WITH AXILLARY SENTINEL NODE BIOPSY;  Surgeon: Brantley Stage,  Marcello Moores, MD;  Location: [facility];  Service: General;  Laterality: Right;  . TOTAL KNEE ARTHROPLASTY Right 01/26/2013   DR Ronnie Derby  . TOTAL KNEE ARTHROPLASTY Right 01/26/2013   Procedure: TOTAL KNEE ARTHROPLASTY;  Surgeon: Vickey Huger, MD;  Location: [facility];  Service: Orthopedics;  Laterality: Right;  . [place]   Social History   Social History Narrative  . Not on file     Objective: Vital Signs: BP 110/65 (BP Location: Left Arm, Patient Position: Sitting, Cuff Size: Large)   Pulse 81   Resp 15   Ht 5\' 7"  (1.702 m)   Wt 239 lb (108.4 kg)   BMI 37.43 kg/m    Physical Exam  Constitutional: She is oriented to person, place, and time. She appears well-developed and well-nourished.  HENT:  Head: Normocephalic and atraumatic.  Eyes: Conjunctivae and EOM are normal.  Neck: Normal range  of motion.  Cardiovascular: Normal rate, regular rhythm, normal heart sounds and intact distal pulses.  Pulmonary/Chest: Effort normal and breath sounds normal.  Abdominal: Soft. Bowel sounds are normal.  Lymphadenopathy:    She has no cervical adenopathy.  Neurological: She is alert and oriented to person, place, and time.  Skin: Skin is warm and dry. Capillary refill takes less than 2 seconds.  Psychiatric: She has a normal mood and affect. Her behavior is normal.  Nursing note and vitals reviewed.    Musculoskeletal Exam: C-spine good ROM.  Thoracic kyphosis.  lumbar spine limited ROM. Midline spinal tenderness. Shoulder joints limited ROM with some discomfort.  Elbow joints good ROM.  MCPs, PIPs, and DIPs good ROM with no synovitis. PIP and DIP synovial thickening consistent with osteoarthritis.  Hip joints, knee joints, ankle joints, MCPs, PIPs, and DIPs good ROM.  No warmth or effusion of knee joints.  No tenderness of trochanteric bursa bilaterally.    CDAI Exam: No CDAI exam completed.    Investigation: No additional findings.PLQ eye exam: 06/2016 CBC Latest Ref Rng & Units 06/11/2017 05/08/2017 04/29/2017  WBC 3.9 - 10.3 K/uL 5.2 13.5(H) 5.2  Hemoglobin 11.6 - 15.9 g/dL 13.8 12.7 15.0  Hematocrit 34.8 - 46.6 % 43.4 39.8 46.4(H)  Platelets 145 - 400 K/uL 184 171 142(L)   CMP Latest Ref Rng & Units 06/11/2017 05/08/2017 05/07/2017  Glucose 70 - 140 mg/dL 103 201(H) 126(H)  BUN 7 - 26 mg/dL 16 12 11   Creatinine 0.60 - 1.10 mg/dL 0.92 1.09(H) 0.94  Sodium 136 - 145 mmol/L 141 140 141  Potassium 3.5 - 5.1 mmol/L 3.8 4.5 3.5  Chloride 98 - 109 mmol/L 104 105 105  CO2 22 - 29 mmol/L 27 27 29   Calcium 8.4 - 10.4 mg/dL 9.8 9.0 9.5  Total Protein 6.4 - 8.3 g/dL 7.1 5.9(L) -  Total Bilirubin 0.2 - 1.2 mg/dL 0.4 0.5 -  Alkaline Phos 40 - 150 U/L 79 73 -  AST 5 - 34 U/L 34 38 -  ALT 0 - 55 U/L 47 42 -    Imaging: No results found.  Speciality Comments: No specialty comments  available.    Procedures:  No procedures performed Allergies: Oxycodone; Hydrocodone; Percocet [oxycodone-acetaminophen]; Soma [carisoprodol]; and Ultram [tramadol hcl]   Assessment / Plan:     Visit Diagnoses: Inflammatory arthritis: She has no synovitis on exam today.  She has noticed improvement since starting on Plaquenil.  She is taking Plaquenil 200 mg by mouth 1 tablet daily. She does not need any refills at this time. She is  been having increased discomfort in her left shoulder and left knee.  She would like to return in August for left knee cortisone injection before her granddaughter's wedding.  She was given a handout of knee exercises that she can perform at home.  Calcium pyrophosphate deposition disease - She has episodic flares of CPPD for which she takes colchicine.  High risk medication use - PLQ.eye exam: 06/2016.  She is going to schedule her next Plaquenil eye exam.  She was given a Plaquenil eye exam form today in the office.  CBC and CMP were drawn yesterday.  Primary osteoarthritis of both hands: She has PIP and DIP synovial thickening consistent with osteoarthritis of bilateral hands.  Joint protection and muscle strengthening were discussed.  She has no synovitis on exam.  Primary osteoarthritis of both feet: She has PIP and DIP synovial thickening consistent with osteoarthritis of bilateral feet.  She is having some discomfort in there is more deformities in her feet.  She has been having to find shoes that have a wider toe box for relief.  We discussed the importance of wearing proper fitting shoes.  History of total right knee replacement: Performed by Dr. Ronnie Derby.  No warmth or effusion on exam.  She is good range of motion.  She has no discomfort in her right knee at this time.  DDD (degenerative disc disease), lumbar: She has midline spinal tenderness in the lumbar region as well as limited range of motion.  She expresses occasional discomfort and stiffness in her  lower back.  She was given a handout of back exercises that she can work on at home.  Other medical conditions are listed as follows:  Pedal edema  Plantar fasciitis, bilateral  Paroxysmal atrial fibrillation (HCC)  Essential hypertension  Pure hypercholesterolemia    Orders: No orders of the defined types were placed in this encounter.  No orders of the defined types were placed in this encounter.     Follow-Up Instructions: Return in about 6 months (around 12/12/2017) for Inflammatory arthritis, CPPD, osteoarthritis, DDD.   Tara Neas, PA-C   I examined and evaluated the patient with Tara Sams PA.  She had no synovitis on my examination.  She does have osteoarthritic changes as described above.  Is been having locked of lower back discomfort.  Handout for back and strengthening exercise was given.  The plan of care was discussed as noted above.  Bo Merino, MD  Note - This record has been created using Editor, commissioning.  Chart creation errors have been sought, but may not always  have been located. Such creation errors do not reflect on  the standard of medical care.

## 2017-06-11 ENCOUNTER — Inpatient Hospital Stay (HOSPITAL_BASED_OUTPATIENT_CLINIC_OR_DEPARTMENT_OTHER): Payer: Medicare Other | Admitting: Oncology

## 2017-06-11 ENCOUNTER — Inpatient Hospital Stay: Payer: Medicare Other

## 2017-06-11 ENCOUNTER — Inpatient Hospital Stay: Payer: Medicare Other | Attending: Oncology

## 2017-06-11 ENCOUNTER — Encounter: Payer: Self-pay | Admitting: *Deleted

## 2017-06-11 VITALS — BP 132/65 | HR 87 | Temp 98.2°F | Resp 17

## 2017-06-11 VITALS — BP 156/66 | HR 80 | Temp 97.8°F | Resp 18 | Ht 67.0 in | Wt 237.6 lb

## 2017-06-11 DIAGNOSIS — Z5112 Encounter for antineoplastic immunotherapy: Secondary | ICD-10-CM | POA: Insufficient documentation

## 2017-06-11 DIAGNOSIS — C50111 Malignant neoplasm of central portion of right female breast: Secondary | ICD-10-CM

## 2017-06-11 DIAGNOSIS — C50911 Malignant neoplasm of unspecified site of right female breast: Secondary | ICD-10-CM

## 2017-06-11 DIAGNOSIS — Z170 Estrogen receptor positive status [ER+]: Secondary | ICD-10-CM

## 2017-06-11 DIAGNOSIS — M25562 Pain in left knee: Secondary | ICD-10-CM | POA: Diagnosis not present

## 2017-06-11 DIAGNOSIS — M25661 Stiffness of right knee, not elsewhere classified: Secondary | ICD-10-CM | POA: Diagnosis not present

## 2017-06-11 DIAGNOSIS — Z79811 Long term (current) use of aromatase inhibitors: Secondary | ICD-10-CM

## 2017-06-11 DIAGNOSIS — Z87891 Personal history of nicotine dependence: Secondary | ICD-10-CM | POA: Diagnosis not present

## 2017-06-11 DIAGNOSIS — Z9011 Acquired absence of right breast and nipple: Secondary | ICD-10-CM

## 2017-06-11 DIAGNOSIS — N6489 Other specified disorders of breast: Secondary | ICD-10-CM | POA: Diagnosis not present

## 2017-06-11 LAB — CBC WITH DIFFERENTIAL/PLATELET
BASOS ABS: 0 10*3/uL (ref 0.0–0.1)
BASOS PCT: 1 %
EOS ABS: 0.1 10*3/uL (ref 0.0–0.5)
Eosinophils Relative: 2 %
HCT: 43.4 % (ref 34.8–46.6)
Hemoglobin: 13.8 g/dL (ref 11.6–15.9)
LYMPHS PCT: 59 %
Lymphs Abs: 3.1 10*3/uL (ref 0.9–3.3)
MCH: 26.9 pg (ref 25.1–34.0)
MCHC: 31.8 g/dL (ref 31.5–36.0)
MCV: 84.6 fL (ref 79.5–101.0)
Monocytes Absolute: 0.5 10*3/uL (ref 0.1–0.9)
Monocytes Relative: 9 %
Neutro Abs: 1.5 10*3/uL (ref 1.5–6.5)
Neutrophils Relative %: 29 %
PLATELETS: 184 10*3/uL (ref 145–400)
RBC: 5.13 MIL/uL (ref 3.70–5.45)
RDW: 14.2 % (ref 11.2–14.5)
WBC: 5.2 10*3/uL (ref 3.9–10.3)

## 2017-06-11 LAB — COMPREHENSIVE METABOLIC PANEL
ALBUMIN: 4.2 g/dL (ref 3.5–5.0)
ALT: 47 U/L (ref 0–55)
AST: 34 U/L (ref 5–34)
Alkaline Phosphatase: 79 U/L (ref 40–150)
Anion gap: 10 (ref 3–11)
BUN: 16 mg/dL (ref 7–26)
CHLORIDE: 104 mmol/L (ref 98–109)
CO2: 27 mmol/L (ref 22–29)
Calcium: 9.8 mg/dL (ref 8.4–10.4)
Creatinine, Ser: 0.92 mg/dL (ref 0.60–1.10)
GFR calc Af Amer: >60 mL/min (ref >60)
GFR calc non Af Amer: 57 mL/min — ABNORMAL LOW (ref >60)
Glucose, Bld: 103 mg/dL (ref 70–140)
Potassium: 3.8 mmol/L (ref 3.5–5.1)
SODIUM: 141 mmol/L (ref 136–145)
Total Bilirubin: 0.4 mg/dL (ref 0.2–1.2)
Total Protein: 7.1 g/dL (ref 6.4–8.3)

## 2017-06-11 MED ORDER — DIPHENHYDRAMINE HCL 25 MG PO CAPS
ORAL_CAPSULE | ORAL | Status: AC
  Filled 2017-06-11: qty 1

## 2017-06-11 MED ORDER — ACETAMINOPHEN 325 MG PO TABS
650.0000 mg | ORAL_TABLET | Freq: Once | ORAL | Status: AC
  Administered 2017-06-11: 650 mg via ORAL

## 2017-06-11 MED ORDER — ACETAMINOPHEN 325 MG PO TABS
ORAL_TABLET | ORAL | Status: AC
Start: 2017-06-11 — End: ?
  Filled 2017-06-11: qty 2

## 2017-06-11 MED ORDER — HEPARIN SOD (PORK) LOCK FLUSH 100 UNIT/ML IV SOLN
500.0000 [IU] | Freq: Once | INTRAVENOUS | Status: AC | PRN
  Administered 2017-06-11: 500 [IU]
  Filled 2017-06-11: qty 5

## 2017-06-11 MED ORDER — SODIUM CHLORIDE 0.9% FLUSH
10.0000 mL | INTRAVENOUS | Status: DC | PRN
  Administered 2017-06-11: 10 mL
  Filled 2017-06-11: qty 10

## 2017-06-11 MED ORDER — TRASTUZUMAB CHEMO 150 MG IV SOLR
900.0000 mg | Freq: Once | INTRAVENOUS | Status: AC
  Administered 2017-06-11: 900 mg via INTRAVENOUS
  Filled 2017-06-11: qty 42.86

## 2017-06-11 MED ORDER — SODIUM CHLORIDE 0.9 % IV SOLN
Freq: Once | INTRAVENOUS | Status: AC
  Administered 2017-06-11: 09:00:00 via INTRAVENOUS

## 2017-06-11 MED ORDER — DIPHENHYDRAMINE HCL 25 MG PO CAPS
25.0000 mg | ORAL_CAPSULE | Freq: Once | ORAL | Status: AC
  Administered 2017-06-11: 25 mg via ORAL

## 2017-06-11 NOTE — Progress Notes (Signed)
[place]  [phone] Fax:(336) [phone]     ID: Tara Cox DOB: 1934/04/26  MR#: [id number]  TCY#:[id number]  Patient Care Team: Tara Sacramento, MD as PCP - General (Family Medicine) Tara Cox, Tara Dad, MD as Consulting Physician (Oncology) Tara Luna, MD as Consulting Physician (General Surgery) Tara Rudd, MD as Consulting Physician (Radiation Oncology) Tara Huger, MD as Consulting Physician (Orthopedic Surgery) Tara Merino, MD as Consulting Physician (Rheumatology) Tara Harp, MD as Consulting Physician (Cardiology) OTHER MD:   CHIEF COMPLAINT: Triple positive breast cancer  CURRENT TREATMENT: Anastrozole, trastuzumab   HISTORY OF CURRENT ILLNESS: From the original intake note:  Tara Cox had mammography at the breast center October 11, 2011 showing calcifications in the upper right breast.  Six-month follow-up was suggested, but  the patient did not follow-up.  More recently she noticed nipple inversion on the right breast and followed this without reporting it over the last several months. She did undergo bilateral diagnostic mammography with tomography and right breast ultrasonography at [facility] on 03/26/2017 showing: breast density category B. There is a highly suspicious right retroareolar mass  measuring approximately 2.3 x 1.5 x 2.8 cm  with associated calcifications, together the mass and calcifications measure up to 3.3 cm mammographically. There are 2 lymph nodes in the right axilla with borderline thickened cortices, 1 of which measures 1 x 0.9 x 0.6 cm with a cortex thickness of 0.4 mm.  Accordingly on 03/29/2017 she proceeded to biopsy of the right breast and one axillary lymph node.. The pathology from this procedure showed ([id number]): Invasive lobular carcinoma,grade II, E-cadherin negative. The right axillary lymph node was negative for carcinoma. Prognostic indicators significant for: estrogen  receptor, 100% positive and progesterone receptor, 100% positive, both with strong staining intensity. Proliferation marker Ki67 at 25%. HER2 amplified with ratios  HER2/CEP17 signals 2.22 and average HER2 copies per cell 3.00  The patient's subsequent history is as detailed below.  INTERVAL HISTORY: Tara Cox returns today for follow up and treatment of her triple positive breast cancer accompanied by her daughter, Tara Cox, and Tara Cox's husband, Tara Cox. Tara Cox started anastrozole on 05/28/2017. She is tolerating this well. She reports minimal hot flashes. She denies issues with vaginal dryness.   She also receives trastuzumab given every 21 days. Today begins day 1 cycle 1. She tolerated this well. Her port worked well, and she used her numbing cream.    REVIEW OF SYSTEMS: Tara Cox reports that for exercise, she sees a physical therapist. She continues to have problems with both of her knee. The pain has gotten better in one knee, but worse in the other. She denies unusual headaches, visual changes, nausea, vomiting, or dizziness. There has been no unusual cough, phlegm production, or pleurisy. This been no change in bowel or bladder habits. She denies unexplained fatigue or unexplained weight loss, bleeding, rash, or fever. A detailed review of systems was otherwise stable.    PAST MEDICAL HISTORY: Past Medical History:  Diagnosis Date  . A-fib ([place])   . Arthritis   . 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 DDD (degenerative disc disease), lumbar 12/08/2015   S/P discectomy   . Diaphragmatic hernia 12/08/2015  . Dysrhythmia    A-Fib  . Edema of lower extremity 06/29/09   Lower Venous Exam- normal. No evidence of thrombus or thrombophlebitis.  . Esophagitis 12/08/2015  . Frequent urination at night   . GERD (gastroesophageal reflux disease)   . Hemorrhoids   . History of calcium pyrophosphate  deposition disease (CPPD) 12/08/2015  . History of miscarriage 12/08/2015   x3  . History of total  right knee replacement 12/08/2015  . Hypertension 02/24/09   Echo-EF 62%; Myocardial Perfusion Study-Normal. No signifcant ischemia noted.  . IBS (irritable bowel syndrome)   . Migraine   . Osteoarthritis of both feet 12/08/2015  . Osteoarthritis of both hands 12/08/2015  . PONV (postoperative nausea and vomiting)   . Uterine fibroid 12/08/2015    PAST SURGICAL HISTORY: Past Surgical History:  Procedure Laterality Date  . ABDOMINAL HYSTERECTOMY  1974  . APPENDECTOMY    . BACK SURGERY  2001  . BUNIONECTOMY  1986  . CESAREAN SECTION  1971  . COLONOSCOPY    . COLONOSCOPY WITH PROPOFOL N/A 05/28/2014   Procedure: COLONOSCOPY WITH PROPOFOL;  Surgeon: Carol Ada, MD;  Location: [facility];  Service: Endoscopy;  Laterality: N/A;  . ESOPHAGOGASTRODUODENOSCOPY (EGD) WITH PROPOFOL N/A 05/28/2014   Procedure: ESOPHAGOGASTRODUODENOSCOPY (EGD) WITH PROPOFOL;  Surgeon: Carol Ada, MD;  Location: [facility];  Service: Endoscopy;  Laterality: N/A;  . EXPLORATORY LAPAROTOMY     open procedure  . EYE SURGERY Bilateral    cataract removal  . PORTACATH PLACEMENT Right 05/07/2017   Procedure: INSERTION PORT-A-CATH;  Surgeon: Tara Luna, MD;  Location: [facility];  Service: General;  Laterality: Right;  . SHOULDER SURGERY Right   . SIMPLE MASTECTOMY WITH AXILLARY SENTINEL NODE BIOPSY Right 05/07/2017   Procedure: RIGHT SIMPLE MASTECTOMY WITH AXILLARY SENTINEL NODE BIOPSY;  Surgeon: Tara Luna, MD;  Location: [facility];  Service: General;  Laterality: Right;  . TOTAL KNEE ARTHROPLASTY Right 01/26/2013   DR Ronnie Derby  . TOTAL KNEE ARTHROPLASTY Right 01/26/2013   Procedure: TOTAL KNEE ARTHROPLASTY;  Surgeon: Tara Huger, MD;  Location: [facility];  Service: Orthopedics;  Laterality: Right;  . UTERINE FIBROID SURGERY  1967    FAMILY HISTORY Family History  Problem Relation Age of Onset  . Other Mother   . Varicose Veins Mother   . Emphysema Mother   . Heart disease Father        heart attack  .  Diabetes Sister   . Other Sister        pacemaker  . Diabetes Sister   . Heart disease Sister   The patient's father died in 34 (around age 55) in [place] due to sudden death. The patient's mother died around age 40 due to emphysema. The patient was born in [place], [place]. The patient has 2 brothers and 3 sisters. One sister had a lumpectomy in 1991, but this was not malignant. Another sister died of pancreatic cancer. The patient denies a family history of breast or ovarian cancer.    GYNECOLOGIC HISTORY:  No LMP recorded. Patient has had a hysterectomy. Menarche: 83 years old Age at first live birth: 83 years old She is GXP1.  Her LMP was at age 69 when she underwent hysterectomy, without salpingo-oophorectomy. She was on Premarin for more than 20 years.  She never used oral contraceptives.   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 Her husband, Tara Cox, used to be in banking, and he is currently retired. The patient's biological daughter, Tara Cox, is a Health and safety inspector at a contact center in [place]. The patient's adopted daughter, Tara Cox, is an Social research officer, government in [place]. The patient's adopted son, Tara Cox, is unemployed in [place]. The patient has 8 grandchildren, one of whom belongs to [place] and works as a Pharmacist, community.  The patient belongs to [place].      ADVANCED DIRECTIVES:    HEALTH MAINTENANCE: Social History   Tobacco Use  . Smoking status: Former Smoker    Packs/day: 1.00    Years: 20.00    Pack years: 20.00    Types: Cigarettes    Last attempt to quit: 02/19/1990    Years since quitting: 27.3  . Smokeless tobacco: Never Used  . Tobacco comment: ' i QUIT SMOKING MANY YEARS AGO "  Substance Use Topics  . Alcohol use: No  . Drug use: No     Colonoscopy: Dr. Benson Norway 2016  PAP:  Bone density:   Allergies  Allergen Reactions  . Oxycodone Itching, Nausea And Vomiting and  Other (See Comments)    Full body weakness   . Hydrocodone Other (See Comments)    Reports they make her feel sick/drowsy  . Percocet [Oxycodone-Acetaminophen] Itching    Pt says she can take tylenol   . Soma [Carisoprodol] Nausea And Vomiting  . Ultram [Tramadol Hcl] Nausea And Vomiting    Current Outpatient Medications  Medication Sig Dispense Refill  . acetaminophen (TYLENOL) 650 MG CR tablet Take 650 mg by mouth every 8 (eight) hours as needed for pain.    Marland Kitchen anastrozole (ARIMIDEX) 1 MG tablet Take 1 tablet (1 mg total) by mouth daily. 90 tablet 4  . Ascorbic Acid (VITAMIN C WITH ROSE HIPS) 500 MG tablet Take 500 mg by mouth daily.    Marland Kitchen aspirin-sod bicarb-citric acid (ALKA-SELTZER) 325 MG TBEF tablet Take 325 mg by mouth every 6 (six) hours as needed.    Marland Kitchen atorvastatin (LIPITOR) 20 MG tablet Take 20 mg by mouth at bedtime.     . benzonatate (TESSALON) 200 MG capsule Take 200 mg by mouth 3 (three) times daily as needed for cough.    . Calcium Carbonate-Vit D-Min (CALCIUM 600+D3 PLUS MINERALS) 600-800 MG-UNIT TABS Take 1 tablet by mouth at bedtime.    . Cholecalciferol (VITAMIN D) 2000 units CAPS Take 2,000 Units by mouth daily.    . colchicine 0.6 MG tablet TAKE 1 TABLET DAILY BY  MOUTH. 90 tablet 1  . dextromethorphan (DELSYM) 30 MG/5ML liquid Take 30 mg by mouth daily as needed for cough.    . diphenhydrAMINE (BENADRYL) 25 mg capsule Take 25 mg by mouth every 6 (six) hours as needed for allergies.    Marland Kitchen docusate sodium (COLACE) 250 MG capsule Take 250 mg by mouth daily as needed for constipation.    . furosemide (LASIX) 20 MG tablet Take 20 mg by mouth daily.      Marland Kitchen gabapentin (NEURONTIN) 300 MG capsule Take 300 mg by mouth at bedtime.     Marland Kitchen HYDROcodone-acetaminophen (NORCO/VICODIN) 5-325 MG tablet Take 1 tablet by mouth every 6 (six) hours as needed for moderate pain. 20 tablet 0  . hydrocortisone (ANUSOL-HC) 2.5 % rectal cream Place 1 application rectally 2 (two) times daily as  needed for hemorrhoids or itching.    . hydroxychloroquine (PLAQUENIL) 200 MG tablet TAKE 1 TABLET BY MOUTH TWO  TIMES DAILY 180 tablet 0  . lidocaine-prilocaine (EMLA) cream Apply 1 application topically as needed. 30 g 0  . losartan-hydrochlorothiazide (HYZAAR) 100-25 MG per tablet Take 1 tablet by mouth daily. IN THE MORNING.    . magnesium oxide (MAG-OX) 400 MG tablet Take 400 mg by mouth daily.     . metoprolol tartrate (LOPRESSOR) 25 MG tablet Take 25 mg by mouth daily with  lunch.     . Multiple Vitamin (MULTIVITAMIN) tablet Take 1 tablet by mouth daily. CENTRUM    . NONFORMULARY OR COMPOUNDED ITEM Apply 1-2 g topically 4 (four) times daily as needed for pain. BACLOFEN-DOXEPIN-GABAPENTIN-TOPIRAMATE-PENTOXIFYLLINE  2  . omeprazole (PRILOSEC) 40 MG capsule Take 40 mg by mouth daily as needed (FOR ACID REFLUX/INDIGESTION).   12  . OVER THE COUNTER MEDICATION Take 600 mcg by mouth daily as needed (AT ONSET OF MIGRAINE). MYGRAFEW (TANACETUM PARTHENIUM)    . potassium chloride SA (K-DUR,KLOR-CON) 20 MEQ tablet Take 10 mEq by mouth daily.    Marland Kitchen triamcinolone cream (KENALOG) 0.1 % Apply 1 application topically 2 (two) times daily as needed (Vaginal Itching).    . warfarin (COUMADIN) 5 MG tablet Take 0.5-1 tablets (2.5-5 mg total) by mouth daily. Take 5 mg tablet daily or as directed by pharmacist from [facility] (Patient taking differently: Take 5 mg by mouth at bedtime. ) 40 tablet 1   No current facility-administered medications for this visit.     OBJECTIVE: Older African-American Cox in no acute distress  Vitals:   06/11/17 1132  BP: (!) 156/66  Pulse: 80  Resp: 18  Temp: 97.8 F (36.6 C)  SpO2: 98%     Body mass index is 37.21 kg/m.   Wt Readings from Last 3 Encounters:  06/11/17 237 lb 9.6 oz (107.8 kg)  05/24/17 236 lb 4.8 oz (107.2 kg)  05/07/17 241 lb 8 oz (109.5 kg)      ECOG FS:1 - Symptomatic but completely ambulatory  Sclerae unicteric, pupils round and  equal No cervical or supraclavicular adenopathy Lungs no rales or rhonchi Heart regular rate and rhythm Abd soft, nontender, positive bowel sounds MSK no focal spinal tenderness, no upper extremity lymphedema Neuro: nonfocal, well oriented, appropriate affect Breasts: A nodule in the right axilla measures approximately 1 x 1/2 cm and is firm.  It would be suspicious for a metastatic deposit except that it is getting smaller.  It is most likely a seroma.  LAB RESULTS:  CMP     Component Value Date/Time   NA 141 06/11/2017 0818   NA 142 09/22/2015   K 3.8 06/11/2017 0818   CL 104 06/11/2017 0818   CO2 27 06/11/2017 0818   GLUCOSE 103 06/11/2017 0818   BUN 16 06/11/2017 0818   BUN 19 09/22/2015   CREATININE 0.92 06/11/2017 0818   CREATININE 0.91 (H) 10/11/2016 1136   CALCIUM 9.8 06/11/2017 0818   PROT 7.1 06/11/2017 0818   ALBUMIN 4.2 06/11/2017 0818   AST 34 06/11/2017 0818   ALT 47 06/11/2017 0818   ALKPHOS 79 06/11/2017 0818   BILITOT 0.4 06/11/2017 0818   GFRNONAA 57 (L) 06/11/2017 0818   GFRNONAA 60 10/11/2016 1136   GFRAA >60 06/11/2017 0818   GFRAA 69 10/11/2016 1136    No results found for: TOTALPROTELP, ALBUMINELP, A1GS, A2GS, BETS, BETA2SER, GAMS, MSPIKE, SPEI  No results found for: KPAFRELGTCHN, LAMBDASER, KAPLAMBRATIO  Lab Results  Component Value Date   WBC 5.2 06/11/2017   NEUTROABS 1.5 06/11/2017   HGB 13.8 06/11/2017   HCT 43.4 06/11/2017   MCV 84.6 06/11/2017   PLT 184 06/11/2017    '@LASTCHEMISTRY' @  No results found for: LABCA2  No components found for: LYHTMB311  No results for input(s): INR in the last 168 hours.  No results found for: LABCA2  No results found for: ETK244  No results found for: CXF072  No results found for: UVJ505  No  results found for: CA2729  No components found for: HGQUANT  No results found for: CEA1 / No results found for: CEA1   No results found for: AFPTUMOR  No results found for: CHROMOGRNA  No  results found for: PSA1  Appointment on 06/11/2017  Component Date Value Ref Range Status  . WBC 06/11/2017 5.2  3.9 - 10.3 K/uL Final  . RBC 06/11/2017 5.13  3.70 - 5.45 MIL/uL Final  . Hemoglobin 06/11/2017 13.8  11.6 - 15.9 g/dL Final  . HCT 06/11/2017 43.4  34.8 - 46.6 % Final  . MCV 06/11/2017 84.6  79.5 - 101.0 fL Final  . MCH 06/11/2017 26.9  25.1 - 34.0 pg Final  . MCHC 06/11/2017 31.8  31.5 - 36.0 g/dL Final  . RDW 06/11/2017 14.2  11.2 - 14.5 % Final  . Platelets 06/11/2017 184  145 - 400 K/uL Final  . Neutrophils Relative % 06/11/2017 29  % Final  . Neutro Abs 06/11/2017 1.5  1.5 - 6.5 K/uL Final  . Lymphocytes Relative 06/11/2017 59  % Final  . Lymphs Abs 06/11/2017 3.1  0.9 - 3.3 K/uL Final  . Monocytes Relative 06/11/2017 9  % Final  . Monocytes Absolute 06/11/2017 0.5  0.1 - 0.9 K/uL Final  . Eosinophils Relative 06/11/2017 2  % Final  . Eosinophils Absolute 06/11/2017 0.1  0.0 - 0.5 K/uL Final  . Basophils Relative 06/11/2017 1  % Final  . Basophils Absolute 06/11/2017 0.0  0.0 - 0.1 K/uL Final   Performed at [facility], [place] [address], [place], [place] [address]  . Sodium 06/11/2017 141  136 - 145 mmol/L Final  . Potassium 06/11/2017 3.8  3.5 - 5.1 mmol/L Final  . Chloride 06/11/2017 104  98 - 109 mmol/L Final  . CO2 06/11/2017 27  22 - 29 mmol/L Final  . Glucose, Bld 06/11/2017 103  70 - 140 mg/dL Final  . BUN 06/11/2017 16  7 - 26 mg/dL Final  . Creatinine, Ser 06/11/2017 0.92  0.60 - 1.10 mg/dL Final  . Calcium 06/11/2017 9.8  8.4 - 10.4 mg/dL Final  . Total Protein 06/11/2017 7.1  6.4 - 8.3 g/dL Final  . Albumin 06/11/2017 4.2  3.5 - 5.0 g/dL Final  . AST 06/11/2017 34  5 - 34 U/L Final  . ALT 06/11/2017 47  0 - 55 U/L Final  . Alkaline Phosphatase 06/11/2017 79  40 - 150 U/L Final  . Total Bilirubin 06/11/2017 0.4  0.2 - 1.2 mg/dL Final  . GFR calc non Af Amer 06/11/2017 57* >60 mL/min Final  . GFR calc Af Amer 06/11/2017 >60   >60 mL/min Final   Comment: (NOTE) The eGFR has been calculated using the CKD EPI equation. This calculation has not been validated in all clinical situations. eGFR's persistently <60 mL/min signify possible Chronic Kidney Disease.   Georgiann Hahn gap 06/11/2017 10  3 - 11 Final   Performed at [facility], [place] [address], [place], [place] [address]    (this displays the last labs from the last 3 days)  No results found for: TOTALPROTELP, ALBUMINELP, A1GS, A2GS, BETS, BETA2SER, GAMS, MSPIKE, SPEI (this displays SPEP labs)  No results found for: KPAFRELGTCHN, LAMBDASER, KAPLAMBRATIO (kappa/lambda light chains)  No results found for: HGBA, HGBA2QUANT, HGBFQUANT, HGBSQUAN (Hemoglobinopathy evaluation)   Lab Results  Component Value Date   LDH 162 10/22/2006    No results found for: IRON, TIBC, IRONPCTSAT (Iron and TIBC)  No  results found for: FERRITIN  Urinalysis    Component Value Date/Time   COLORURINE YELLOW 09/27/2015 [place] 09/27/2015 1532   LABSPEC 1.015 09/27/2015 1532   PHURINE 6.5 09/27/2015 1532   GLUCOSEU NEGATIVE 09/27/2015 1532   HGBUR TRACE (A) 09/27/2015 1532   BILIRUBINUR NEGATIVE 09/27/2015 1532   KETONESUR NEGATIVE 09/27/2015 1532   PROTEINUR NEGATIVE 09/27/2015 1532   UROBILINOGEN 0.2 01/19/2013 0954   NITRITE NEGATIVE 09/27/2015 1532   LEUKOCYTESUR NEGATIVE 09/27/2015 1532     STUDIES: No results found.  ELIGIBLE FOR AVAILABLE RESEARCH PROTOCOL: Exact sciences study  ASSESSMENT: 83 y.o. [place] [address] Cox status post central right breast biopsy 03/29/2017 for a clinical T1c N0, stage IA invasive lobular carcinoma, grade 2, strongly estrogen and progesterone receptor positive, also HER-2 amplified, with an MIB-1 of 25%  (1) status post right mastectomy with sentinel lymph node sampling 05/07/2017 for a pT3 pN1, stage IIB invasive lobular carcinoma, grade 3, with negative margins.  (2) consider adjuvant  radiation given size of tumor and positive nodes  (3) trastuzumab adjuvantly for 1 year to start 06/04/2017  (a) echocardiogram 04/16/2017 shows an ejection fraction of 60-65%.  (4) anastrozole started 05/28/2017  (a) bone density  (5) small seroma right axilla, requiring follow-up  PLAN: Tara Cox tolerated her first trastuzumab treatment without event.  I expect that she will do equally well with subsequent treatment.  She is already scheduled for a repeat visit with cardio oncology in August.  She will see my 51 assistant with the second trastuzumab treatment, but after that we will see her after each echocardiogram until she completes her year of Herceptin  She is tolerating anastrozole well and the plan is to continue that a total of 5 years  She knows to call for any issues that may develop before the next visit.   Tara Cox, Tara Dad, MD  06/11/17 12:08 PM Medical Oncology and Hematology [facility] [address] [place], [place] [address] Tel. [phone]    Fax. [phone]  Alice Rieger, am acting as scribe for Chauncey Cruel MD.  I, Lurline Del MD, have reviewed the above documentation for accuracy and completeness, and I agree with the above.

## 2017-06-11 NOTE — Patient Instructions (Addendum)
[place] Discharge Instructions for Patients Receiving Chemotherapy  Today you received the following chemotherapy agents Herceptin.  To help prevent nausea and vomiting after your treatment, we encourage you to take your nausea medication as prescribed by MD.   If you develop nausea and vomiting that is not controlled by your nausea medication, call the clinic.   BELOW ARE SYMPTOMS THAT SHOULD BE REPORTED IMMEDIATELY:  *FEVER GREATER THAN 100.5 F  *CHILLS WITH OR WITHOUT FEVER  NAUSEA AND VOMITING THAT IS NOT CONTROLLED WITH YOUR NAUSEA MEDICATION  *UNUSUAL SHORTNESS OF BREATH  *UNUSUAL BRUISING OR BLEEDING  TENDERNESS IN MOUTH AND THROAT WITH OR WITHOUT PRESENCE OF ULCERS  *URINARY PROBLEMS  *BOWEL PROBLEMS  UNUSUAL RASH Items with * indicate a potential emergency and should be followed up as soon as possible.  Feel free to call the clinic should you have any questions or concerns. The clinic phone number is [phone].  Please show the [facility] at check-in to the Emergency Department and triage nurse.   Trastuzumab injection for infusion What is this medicine? TRASTUZUMAB (tras TOO zoo mab) is a monoclonal antibody. It is used to treat breast cancer and stomach cancer. This medicine may be used for other purposes; ask your health care provider or pharmacist if you have questions. COMMON BRAND NAME(S): Herceptin What should I tell my health care provider before I take this medicine? They need to know if you have any of these conditions: -heart disease -heart failure -lung or breathing disease, like asthma -an unusual or allergic reaction to trastuzumab, benzyl alcohol, or other medications, foods, dyes, or preservatives -pregnant or trying to get pregnant -breast-feeding How should I use this medicine? This drug is given as an infusion into a vein. It is administered in a hospital or clinic by a specially trained health care  professional. Talk to your pediatrician regarding the use of this medicine in children. This medicine is not approved for use in children. Overdosage: If you think you have taken too much of this medicine contact a poison control center or emergency room at once. NOTE: This medicine is only for you. Do not share this medicine with others. What if I miss a dose? It is important not to miss a dose. Call your doctor or health care professional if you are unable to keep an appointment. What may interact with this medicine? This medicine may interact with the following medications: -certain types of chemotherapy, such as daunorubicin, doxorubicin, epirubicin, and idarubicin This list may not describe all possible interactions. Give your health care provider a list of all the medicines, herbs, non-prescription drugs, or dietary supplements you use. Also tell them if you smoke, drink alcohol, or use illegal drugs. Some items may interact with your medicine. What should I watch for while using this medicine? Visit your doctor for checks on your progress. Report any side effects. Continue your course of treatment even though you feel ill unless your doctor tells you to stop. Call your doctor or health care professional for advice if you get a fever, chills or sore throat, or other symptoms of a cold or flu. Do not treat yourself. Try to avoid being around people who are sick. You may experience fever, chills and shaking during your first infusion. These effects are usually mild and can be treated with other medicines. Report any side effects during the infusion to your health care professional. Fever and chills usually do not happen with later infusions. Do not  become pregnant while taking this medicine or for 7 months after stopping it. Women should inform their doctor if they wish to become pregnant or think they might be pregnant. Women of child-bearing potential will need to have a negative pregnancy test  before starting this medicine. There is a potential for serious side effects to an unborn child. Talk to your health care professional or pharmacist for more information. Do not breast-feed an infant while taking this medicine or for 7 months after stopping it. Women must use effective birth control with this medicine. What side effects may I notice from receiving this medicine? Side effects that you should report to your doctor or health care professional as soon as possible: -allergic reactions like skin rash, itching or hives, swelling of the face, lips, or tongue -chest pain or palpitations -cough -dizziness -feeling faint or lightheaded, falls -fever -general ill feeling or flu-like symptoms -signs of worsening heart failure like breathing problems; swelling in your legs and feet -unusually weak or tired Side effects that usually do not require medical attention (report to your doctor or health care professional if they continue or are bothersome): -bone pain -changes in taste -diarrhea -joint pain -nausea/vomiting -weight loss This list may not describe all possible side effects. Call your doctor for medical advice about side effects. You may report side effects to FDA at 1-800-FDA-1088. Where should I keep my medicine? This drug is given in a hospital or clinic and will not be stored at home. NOTE: This sheet is a summary. It may not cover all possible information. If you have questions about this medicine, talk to your doctor, pharmacist, or health care provider.  2018 Elsevier/Gold Standard (2015-12-20 14:37:52)

## 2017-06-12 ENCOUNTER — Encounter: Payer: Self-pay | Admitting: Rheumatology

## 2017-06-12 ENCOUNTER — Ambulatory Visit: Payer: Medicare Other | Admitting: Rheumatology

## 2017-06-12 ENCOUNTER — Encounter: Payer: Self-pay | Admitting: Radiation Oncology

## 2017-06-12 ENCOUNTER — Telehealth: Payer: Self-pay | Admitting: Oncology

## 2017-06-12 VITALS — BP 110/65 | HR 81 | Resp 15 | Ht 67.0 in | Wt 239.0 lb

## 2017-06-12 DIAGNOSIS — E7800 Pure hypercholesterolemia, unspecified: Secondary | ICD-10-CM

## 2017-06-12 DIAGNOSIS — M1990 Unspecified osteoarthritis, unspecified site: Secondary | ICD-10-CM

## 2017-06-12 DIAGNOSIS — M19071 Primary osteoarthritis, right ankle and foot: Secondary | ICD-10-CM

## 2017-06-12 DIAGNOSIS — M722 Plantar fascial fibromatosis: Secondary | ICD-10-CM

## 2017-06-12 DIAGNOSIS — M1120 Other chondrocalcinosis, unspecified site: Secondary | ICD-10-CM | POA: Diagnosis not present

## 2017-06-12 DIAGNOSIS — Z79899 Other long term (current) drug therapy: Secondary | ICD-10-CM | POA: Diagnosis not present

## 2017-06-12 DIAGNOSIS — M1380 Other specified arthritis, unspecified site: Secondary | ICD-10-CM

## 2017-06-12 DIAGNOSIS — Z96651 Presence of right artificial knee joint: Secondary | ICD-10-CM

## 2017-06-12 DIAGNOSIS — M19041 Primary osteoarthritis, right hand: Secondary | ICD-10-CM | POA: Diagnosis not present

## 2017-06-12 DIAGNOSIS — I480 Paroxysmal atrial fibrillation: Secondary | ICD-10-CM

## 2017-06-12 DIAGNOSIS — M51369 Other intervertebral disc degeneration, lumbar region without mention of lumbar back pain or lower extremity pain: Secondary | ICD-10-CM

## 2017-06-12 DIAGNOSIS — I10 Essential (primary) hypertension: Secondary | ICD-10-CM | POA: Diagnosis not present

## 2017-06-12 DIAGNOSIS — R600 Localized edema: Secondary | ICD-10-CM | POA: Diagnosis not present

## 2017-06-12 DIAGNOSIS — M5136 Other intervertebral disc degeneration, lumbar region: Secondary | ICD-10-CM | POA: Diagnosis not present

## 2017-06-12 DIAGNOSIS — M19072 Primary osteoarthritis, left ankle and foot: Secondary | ICD-10-CM

## 2017-06-12 DIAGNOSIS — M19042 Primary osteoarthritis, left hand: Secondary | ICD-10-CM

## 2017-06-12 NOTE — Telephone Encounter (Signed)
Per 6/4 no los °

## 2017-06-12 NOTE — Patient Instructions (Addendum)
Back Exercises If you have pain in your back, do these exercises 2-3 times each day or as told by your doctor. When the pain goes away, do the exercises once each day, but repeat the steps more times for each exercise (do more repetitions). If you do not have pain in your back, do these exercises once each day or as told by your doctor. Exercises Single Knee to Chest  Do these steps 3-5 times in a row for each leg: 1. Lie on your back on a firm bed or the floor with your legs stretched out. 2. Bring one knee to your chest. 3. Hold your knee to your chest by grabbing your knee or thigh. 4. Pull on your knee until you feel a gentle stretch in your lower back. 5. Keep doing the stretch for 10-30 seconds. 6. Slowly let go of your leg and straighten it.  Pelvic Tilt  Do these steps 5-10 times in a row: 1. Lie on your back on a firm bed or the floor with your legs stretched out. 2. Bend your knees so they point up to the ceiling. Your feet should be flat on the floor. 3. Tighten your lower belly (abdomen) muscles to press your lower back against the floor. This will make your tailbone point up to the ceiling instead of pointing down to your feet or the floor. 4. Stay in this position for 5-10 seconds while you gently tighten your muscles and breathe evenly.  Cat-Cow  Do these steps until your lower back bends more easily: 1. Get on your hands and knees on a firm surface. Keep your hands under your shoulders, and keep your knees under your hips. You may put padding under your knees. 2. Let your head hang down, and make your tailbone point down to the floor so your lower back is round like the back of a cat. 3. Stay in this position for 5 seconds. 4. Slowly lift your head and make your tailbone point up to the ceiling so your back hangs low (sags) like the back of a cow. 5. Stay in this position for 5 seconds.  Press-Ups  Do these steps 5-10 times in a row: 1. Lie on your belly (face-down)  on the floor. 2. Place your hands near your head, about shoulder-width apart. 3. While you keep your back relaxed and keep your hips on the floor, slowly straighten your arms to raise the top half of your body and lift your shoulders. Do not use your back muscles. To make yourself more comfortable, you may change where you place your hands. 4. Stay in this position for 5 seconds. 5. Slowly return to lying flat on the floor.  Bridges  Do these steps 10 times in a row: 1. Lie on your back on a firm surface. 2. Bend your knees so they point up to the ceiling. Your feet should be flat on the floor. 3. Tighten your butt muscles and lift your butt off of the floor until your waist is almost as high as your knees. If you do not feel the muscles working in your butt and the back of your thighs, slide your feet 1-2 inches farther away from your butt. 4. Stay in this position for 3-5 seconds. 5. Slowly lower your butt to the floor, and let your butt muscles relax.  If this exercise is too easy, try doing it with your arms crossed over your chest. Belly Crunches  Do these steps 5-10 times in   a row: 1. Lie on your back on a firm bed or the floor with your legs stretched out. 2. Bend your knees so they point up to the ceiling. Your feet should be flat on the floor. 3. Cross your arms over your chest. 4. Tip your chin a little bit toward your chest but do not bend your neck. 5. Tighten your belly muscles and slowly raise your chest just enough to lift your shoulder blades a tiny bit off of the floor. 6. Slowly lower your chest and your head to the floor.  Back Lifts Do these steps 5-10 times in a row: 1. Lie on your belly (face-down) with your arms at your sides, and rest your forehead on the floor. 2. Tighten the muscles in your legs and your butt. 3. Slowly lift your chest off of the floor while you keep your hips on the floor. Keep the back of your head in line with the curve in your back. Look at  the floor while you do this. 4. Stay in this position for 3-5 seconds. 5. Slowly lower your chest and your face to the floor.  Contact a doctor if:  Your back pain gets a lot worse when you do an exercise.  Your back pain does not lessen 2 hours after you exercise. If you have any of these problems, stop doing the exercises. Do not do them again unless your doctor says it is okay. Get help right away if:  You have sudden, very bad back pain. If this happens, stop doing the exercises. Do not do them again unless your doctor says it is okay. This information is not intended to replace advice given to you by your health care provider. Make sure you discuss any questions you have with your health care provider. Document Released: 01/27/2010 Document Revised: 06/02/2015 Document Reviewed: 02/18/2014 Elsevier Interactive Patient Education  2018 [employer].  Knee Exercises Ask your health care provider which exercises are safe for you. Do exercises exactly as told by your health care provider and adjust them as directed. It is normal to feel mild stretching, pulling, tightness, or discomfort as you do these exercises, but you should stop right away if you feel sudden pain or your pain gets worse.Do not begin these exercises until told by your health care provider. STRETCHING AND RANGE OF MOTION EXERCISES These exercises warm up your muscles and joints and improve the movement and flexibility of your knee. These exercises also help to relieve pain, numbness, and tingling. Exercise A: Knee Extension, Prone 1. Lie on your abdomen on a bed. 2. Place your left / right knee just beyond the edge of the surface so your knee is not on the bed. You can put a towel under your left / right thigh just above your knee for comfort. 3. Relax your leg muscles and allow gravity to straighten your knee. You should feel a stretch behind your left / right knee. 4. Hold this position for __________  seconds. 5. Scoot up so your knee is supported between repetitions. Repeat __________ times. Complete this stretch __________ times a day. Exercise B: Knee Flexion, Active  1. Lie on your back with both knees straight. If this causes back discomfort, bend your left / right knee so your foot is flat on the floor. 2. Slowly slide your left / right heel back toward your buttocks until you feel a gentle stretch in the front of your knee or thigh. 3. Hold this position for __________  seconds. 4. Slowly slide your left / right heel back to the starting position. Repeat __________ times. Complete this exercise __________ times a day. Exercise C: Quadriceps, Prone  1. Lie on your abdomen on a firm surface, such as a bed or padded floor. 2. Bend your left / right knee and hold your ankle. If you cannot reach your ankle or pant leg, loop a belt around your foot and grab the belt instead. 3. Gently pull your heel toward your buttocks. Your knee should not slide out to the side. You should feel a stretch in the front of your thigh and knee. 4. Hold this position for __________ seconds. Repeat __________ times. Complete this stretch __________ times a day. Exercise D: Hamstring, Supine 1. Lie on your back. 2. Loop a belt or towel over the ball of your left / right foot. The ball of your foot is on the walking surface, right under your toes. 3. Straighten your left / right knee and slowly pull on the belt to raise your leg until you feel a gentle stretch behind your knee. ? Do not let your left / right knee bend while you do this. ? Keep your other leg flat on the floor. 4. Hold this position for __________ seconds. Repeat __________ times. Complete this stretch __________ times a day. STRENGTHENING EXERCISES These exercises build strength and endurance in your knee. Endurance is the ability to use your muscles for a long time, even after they get tired. Exercise E: Quadriceps, Isometric  1. Lie on  your back with your left / right leg extended and your other knee bent. Put a rolled towel or small pillow under your knee if told by your health care provider. 2. Slowly tense the muscles in the front of your left / right thigh. You should see your kneecap slide up toward your hip or see increased dimpling just above the knee. This motion will push the back of the knee toward the floor. 3. For __________ seconds, keep the muscle as tight as you can without increasing your pain. 4. Relax the muscles slowly and completely. Repeat __________ times. Complete this exercise __________ times a day. Exercise F: Straight Leg Raises - Quadriceps 1. Lie on your back with your left / right leg extended and your other knee bent. 2. Tense the muscles in the front of your left / right thigh. You should see your kneecap slide up or see increased dimpling just above the knee. Your thigh may even shake a bit. 3. Keep these muscles tight as you raise your leg 4-6 inches (10-15 cm) off the floor. Do not let your knee bend. 4. Hold this position for __________ seconds. 5. Keep these muscles tense as you lower your leg. 6. Relax your muscles slowly and completely after each repetition. Repeat __________ times. Complete this exercise __________ times a day. Exercise G: Hamstring, Isometric 1. Lie on your back on a firm surface. 2. Bend your left / right knee approximately __________ degrees. 3. Dig your left / right heel into the surface as if you are trying to pull it toward your buttocks. Tighten the muscles in the back of your thighs to dig as hard as you can without increasing any pain. 4. Hold this position for __________ seconds. 5. Release the tension gradually and allow your muscles to relax completely for __________ seconds after each repetition. Repeat __________ times. Complete this exercise __________ times a day. Exercise H: Hamstring Curls  If told by your health  care provider, do this exercise while  wearing ankle weights. Begin with __________ weights. Then increase the weight by 1 lb (0.5 kg) increments. Do not wear ankle weights that are more than __________. 1. Lie on your abdomen with your legs straight. 2. Bend your left / right knee as far as you can without feeling pain. Keep your hips flat against the floor. 3. Hold this position for __________ seconds. 4. Slowly lower your leg to the starting position.  Repeat __________ times. Complete this exercise __________ times a day. Exercise I: Squats (Quadriceps) 1. Stand in front of a table, with your feet and knees pointing straight ahead. You may rest your hands on the table for balance but not for support. 2. Slowly bend your knees and lower your hips like you are going to sit in a chair. ? Keep your weight over your heels, not over your toes. ? Keep your lower legs upright so they are parallel with the table legs. ? Do not let your hips go lower than your knees. ? Do not bend lower than told by your health care provider. ? If your knee pain increases, do not bend as low. 3. Hold the squat position for __________ seconds. 4. Slowly push with your legs to return to standing. Do not use your hands to pull yourself to standing. Repeat __________ times. Complete this exercise __________ times a day. Exercise J: Wall Slides (Quadriceps)  1. Lean your back against a smooth wall or door while you walk your feet out 18-24 inches (46-61 cm) from it. 2. Place your feet hip-width apart. 3. Slowly slide down the wall or door until your knees bend __________ degrees. Keep your knees over your heels, not over your toes. Keep your knees in line with your hips. 4. Hold for __________ seconds. Repeat __________ times. Complete this exercise __________ times a day. Exercise K: Straight Leg Raises - Hip Abductors 1. Lie on your side with your left / right leg in the top position. Lie so your head, shoulder, knee, and hip line up. You may bend your  bottom knee to help you keep your balance. 2. Roll your hips slightly forward so your hips are stacked directly over each other and your left / right knee is facing forward. 3. Leading with your heel, lift your top leg 4-6 inches (10-15 cm). You should feel the muscles in your outer hip lifting. ? Do not let your foot drift forward. ? Do not let your knee roll toward the ceiling. 4. Hold this position for __________ seconds. 5. Slowly return your leg to the starting position. 6. Let your muscles relax completely after each repetition. Repeat __________ times. Complete this exercise __________ times a day. Exercise L: Straight Leg Raises - Hip Extensors 1. Lie on your abdomen on a firm surface. You can put a pillow under your hips if that is more comfortable. 2. Tense the muscles in your buttocks and lift your left / right leg about 4-6 inches (10-15 cm). Keep your knee straight as you lift your leg. 3. Hold this position for __________ seconds. 4. Slowly lower your leg to the starting position. 5. Let your leg relax completely after each repetition. Repeat __________ times. Complete this exercise __________ times a day. This information is not intended to replace advice given to you by your health care provider. Make sure you discuss any questions you have with your health care provider. Document Released: 11/08/2004 Document Revised: 09/19/2015 Document Reviewed: 10/31/2014 Elsevier Interactive  Patient Education  [employer].

## 2017-06-14 ENCOUNTER — Ambulatory Visit: Payer: Medicare Other | Attending: Surgery | Admitting: Physical Therapy

## 2017-06-14 DIAGNOSIS — Z483 Aftercare following surgery for neoplasm: Secondary | ICD-10-CM

## 2017-06-14 DIAGNOSIS — R293 Abnormal posture: Secondary | ICD-10-CM | POA: Diagnosis present

## 2017-06-14 DIAGNOSIS — M25611 Stiffness of right shoulder, not elsewhere classified: Secondary | ICD-10-CM | POA: Diagnosis present

## 2017-06-14 NOTE — Therapy (Signed)
[place], [place], [address] Phone: [phone]   Fax:  [phone]  Physical Therapy Treatment  Patient Details  Name: Tara Cox MRN: [id number] Date of Birth: Oct 05, 1934 Referring Provider: Dr. Erroll Luna   Encounter Date: 06/14/2017  PT End of Session - 06/14/17 1212    Visit Number  3    Number of Visits  10    Date for PT Re-Evaluation  07/04/17    PT Start Time  0937    PT Stop Time  1020    PT Time Calculation (min)  43 min    Activity Tolerance  Patient tolerated treatment well    Behavior During Therapy  [facility] for tasks assessed/performed       Past Medical History:  Diagnosis Date  . A-fib ([place])   . Arthritis   . 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 DDD (degenerative disc disease), lumbar 12/08/2015   S/P discectomy   . Diaphragmatic hernia 12/08/2015  . Dysrhythmia    A-Fib  . Edema of lower extremity 06/29/09   Lower Venous Exam- normal. No evidence of thrombus or thrombophlebitis.  . Esophagitis 12/08/2015  . Frequent urination at night   . GERD (gastroesophageal reflux disease)   . Hemorrhoids   . History of calcium pyrophosphate deposition disease (CPPD) 12/08/2015  . History of miscarriage 12/08/2015   x3  . History of total right knee replacement 12/08/2015  . Hypertension 02/24/09   Echo-EF 62%; Myocardial Perfusion Study-Normal. No signifcant ischemia noted.  . IBS (irritable bowel syndrome)   . Migraine   . Osteoarthritis of both feet 12/08/2015  . Osteoarthritis of both hands 12/08/2015  . PONV (postoperative nausea and vomiting)   . Uterine fibroid 12/08/2015    Past Surgical History:  Procedure Laterality Date  . ABDOMINAL HYSTERECTOMY  1974  . APPENDECTOMY    . BACK SURGERY  2001  . BUNIONECTOMY  1986  . CESAREAN SECTION  1971  . COLONOSCOPY    . COLONOSCOPY WITH PROPOFOL N/A 05/28/2014   Procedure: COLONOSCOPY WITH PROPOFOL;  Surgeon: Carol Ada, MD;  Location: [facility];  Service: Endoscopy;  Laterality: N/A;  . ESOPHAGOGASTRODUODENOSCOPY (EGD) WITH PROPOFOL N/A 05/28/2014   Procedure: ESOPHAGOGASTRODUODENOSCOPY (EGD) WITH PROPOFOL;  Surgeon: Carol Ada, MD;  Location: [facility];  Service: Endoscopy;  Laterality: N/A;  . EXPLORATORY LAPAROTOMY     open procedure  . EYE SURGERY Bilateral    cataract removal  . PORTACATH PLACEMENT Right 05/07/2017   Procedure: INSERTION PORT-A-CATH;  Surgeon: Erroll Luna, MD;  Location: [place];  Service: General;  Laterality: Right;  . SHOULDER SURGERY Right   . SIMPLE MASTECTOMY WITH AXILLARY SENTINEL NODE BIOPSY Right 05/07/2017   Procedure: RIGHT SIMPLE MASTECTOMY WITH AXILLARY SENTINEL NODE BIOPSY;  Surgeon: Erroll Luna, MD;  Location: [place];  Service: General;  Laterality: Right;  . TOTAL KNEE ARTHROPLASTY Right 01/26/2013   DR Ronnie Derby  . TOTAL KNEE ARTHROPLASTY Right 01/26/2013   Procedure: TOTAL KNEE ARTHROPLASTY;  Surgeon: Vickey Huger, MD;  Location: [place];  Service: Orthopedics;  Laterality: Right;  . UTERINE FIBROID SURGERY  1967    There were no vitals filed for this visit.  Subjective Assessment - 06/14/17 0940    Subjective  Last night I had some pain under my arm, and I don't know if it was because I'm doing my homework that she gave me. I had my infusion on the 4th. It feel swollen under my arm so I put  ice n it for five minutes; it did help the pain. She showed both the surgeon and Dr. Jana Hakim the lump near her incision; Dr. Brantley Stage said you'll get those as the incision heals and Dr. Jana Hakim said we'll just keep an eye on it.    Pertinent History  Right mastectomy and sentinel node biopsy (2/4 positive nodes) on 05/07/17. Port placed for Herceptin. Right rotator cuff repair 3/18. Lumbar discectomy 2001 and right TKR 2014.    Patient Stated Goals  See if my arm is ok    Currently in Pain?  No/denies                       [facility] Adult PT  Treatment/Exercise - 06/14/17 0001      Exercises   Exercises  Shoulder      Shoulder Exercises: Seated   Other Seated Exercises  Pt. demonstrated hands clasped AA shoulder flexion and was encouraged to do this to the point of gentle stretching.      Manual Therapy   Manual Therapy  Soft tissue mobilization;Myofascial release    Soft tissue mobilization  around right mastectomy incision, in all directions    Myofascial Release  unidirectional release in superior direction just superior to mastectomy scar; crosshands across mastectomy scar area in vertical and diagonal directions all avoiding Portacath area    Passive ROM  In supine to left shoulder with movement into er, abduction, and flexion, gentle stretches progressing slowly. Also stretch into position for radiation.                  PT Long Term Goals - 06/06/17 1238      PT LONG TERM GOAL #1   Title  Patient will demonstrate she has returned to baseline related to shoulder ROM and function post operatively.    Time  4    Period  Weeks    Status  On-going      PT LONG TERM GOAL #2   Title  Patient will improve DASH score to be </= 10 for improved shoulder function.    Time  4    Period  Weeks    Status  New      PT LONG TERM GOAL #3   Title  Increase left shoulder flexion to >/= 145 degrees for increased ease reaching overhead.    Time  4    Period  Weeks    Status  New      PT LONG TERM GOAL #4   Title  Increase left shoulder abduction to >/= 135 degrees for increased ease reaching overhead.    Time  4    Period  Weeks    Status  New      PT LONG TERM GOAL #5   Title  Patient will verbalize good understanding of lymphedema risk reduction practices.    Time  4    Period  Weeks    Status  New      Breast Clinic Goals - 04/10/17 1208      Patient will be able to verbalize understanding of pertinent lymphedema risk reduction practices relevant to her diagnosis specifically related to skin care.   Time   1    Period  Days    Status  Achieved      Patient will be able to return demonstrate and/or verbalize understanding of the post-op home exercise program related to regaining shoulder range of motion.   Time  1  Period  Days    Status  Achieved      Patient will be able to verbalize understanding of the importance of attending the postoperative After Breast Cancer Class for further lymphedema risk reduction education and therapeutic exercise.   Time  1    Period  Days    Status  Achieved           Plan - 06/14/17 1213    Clinical Impression Statement  Pt. reported feeling better at end of session, despite some discomfort with some of the stretches. Focused on manual work today.    Rehab Potential  Excellent    Clinical Impairments Affecting Rehab Potential  Previous rotator cuff repair on right side    PT Frequency  2x / week    PT Duration  4 weeks    PT Treatment/Interventions  ADLs/Self Care Home Management;Therapeutic exercise;Patient/family education;Manual techniques;Passive range of motion;Therapeutic activities    PT Next Visit Plan  Continue PROM and ROM exercises    PT Home Exercise Plan  Post op shoulder ROM HEP    Consulted and Agree with Plan of Care  Patient       Patient will benefit from skilled therapeutic intervention in order to improve the following deficits and impairments:  Pain, Decreased range of motion, Decreased knowledge of precautions, Impaired UE functional use, Postural dysfunction  Visit Diagnosis: Abnormal posture  Stiffness of right shoulder, not elsewhere classified  Aftercare following surgery for neoplasm     Problem List Patient Active Problem List   Diagnosis Date Noted  . Breast cancer, stage 2, right ([place]) 05/07/2017  . Malignant neoplasm of central portion of right breast in female, estrogen receptor positive ([place]) 04/02/2017  . Primary insomnia 09/27/2016  . ANA positive 09/27/2016  . Complete tear of right rotator cuff  05/11/2016  . High risk medication use 05/07/2016  . Uterine fibroid 12/08/2015  . History of miscarriage 12/08/2015  . Esophagitis 12/08/2015  . Diaphragmatic hernia 12/08/2015  . Calcium pyrophosphate deposition disease 12/08/2015  . Osteoarthritis of both hands 12/08/2015  . Osteoarthritis of both feet 12/08/2015  . History of total right knee replacement 12/08/2015  . Chondromalacia of both patellae 12/08/2015  . DDD (degenerative disc disease), lumbar 12/08/2015  . Essential hypertension 06/26/2013  . Paroxysmal atrial fibrillation ([place]) 06/26/2013  . Hyperlipidemia 06/26/2013  . DJD (degenerative joint disease) of knee 01/26/2013  . Hemorrhoids 07/11/2010  . IBS (irritable bowel syndrome) 07/11/2010  . Fatigue/loss of sleep 07/11/2010  . Night sweats 07/11/2010  . Chills 07/11/2010  . Weight gain 07/11/2010  . Inflammatory arthritis 07/11/2010  . Incontinence of urine 07/11/2010  . Thrombosed external hemorrhoids 07/11/2010    Cox,Tara 06/14/2017, 12:14 PM  [place], [place], [address] Phone: [phone]   Fax:  [phone]  Name: Tara Cox MRN: [id number] Date of Birth: [birth year]  Serafina Royals, PT 06/14/17 12:14 PM

## 2017-06-17 NOTE — Progress Notes (Signed)
Location of Breast Cancer: Central portion of right breast in female  Did patient present with symptoms (if so, please note symptoms) or was this found on screening mammography?: Patient had a abnormal mammogram on October 11, 2011 and failed to follow-up at 6 months as requested.  Patient noticed more recently nipple inversion on the right breast and followed this without reporting it over the last several months.    Bilateral Diagnostic mammogram 03/26/2017: Right breast density category B, highly suspicious right retroareolar mass measuring approximately 2.3 x 1.5 x 2.8 cm with associated calcifications, together the mass and calcifications measure up to 3.3 cm mammographically.  There are 2 lymph nodes in the right axilla with borderline thickened cortices, 1 of which measures 1 x 0.9 x 0.6 cm with a cortex thickness of 0.4 mm.   Histology per Pathology Report: Right Breast 03/29/2017  Receptor Status: ER(+ 100%), PR (+ 100%), Her2-neu (+), Ki-67(25%)  Past/Anticipated interventions by surgeon, if any:   Diagnosis  05-07-17 Dr. Marcello Moores [place]    1. Breast, simple mastectomy, Right - INVASIVE LOBULAR CARCINOMA, GRADE III, WITH ASSOCIATED LOBULAR CARCINOMA IN SITU (LCIS), 5.8 CM, INVOLVING NIPPLE AND RETROAREOLAR REGION WITH EXTENSION INTO THE UPPER OUTER AND LOWER OUTER QUADRANTS. 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 - LYMPHOVASCULAR INVASION IS PRESENT; PERINEURAL INVASION IS NOT IDENTIFIED. - ALL RESECTION MARGINS ARE NEGATIVE FOR IN SITU OR INVASIVE CARCINOMA (MORE THAN 0.5 CM). - BIOPSY SITE CHANGES. - SEE ONCOLOGY TABLE. 2. Lymph node, sentinel, biopsy, Right Axillary - METASTATIC MAMMARY CARCINOMA TO ONE LYMPH NODE (1/1). - METASTATIC CARCINOMA MEASURES 1.0 CM IN GREATEST DIMENSION. 3. Lymph node, sentinel, biopsy, Right - LYMPH NODE WITH ISOLATED TUMOR CELLS (HIGHLIGHTED BY  IMMUNOSTAIN FOR AE1/AE3) (0/1). 4. Lymph node, sentinel, biopsy, Right - LYMPH NODE WITH ISOLATED TUMOR CELLS (HIGHLIGHTED BY IMMUNOSTAIN FOR AE1/AE3) (0/1). 5. Lymph node, sentinel, biopsy, Right - METASTATIC MAMMARY CARCINOMA TO A LYMPH NODE (1/1). - METASTATIC CARCINOMA MEASURES 0.6 CM IN GREATEST DIMENSION.  Receptor Status: ER(+ 100%), PR (+ 100%), Her2-neu (+), Ki-67(25%)   Past/Anticipated interventions by medical oncology, if any: Dr. Jana Hakim  Chemotherapy No  Dr. Jana Hakim 06/11/2017   Patient started Anastrozole 05/28/2017 x 5 years consider adjuvant radiation given size of tumor and positive nodes  trastuzumab adjuvantly for 1 year to start 06/04/2017             (a) echocardiogram 04/16/2017 shows an ejection fraction of 60-65%.  anastrozole started 05/28/2017             (a) bone density small seroma right axilla, requiring follow-up  Lymphedema issues, if any:  No, Saw a physical therapist for evaluation having PT bi weekly ongoing to be determined. ROM to right arm to right arm good. Skin to right breast operative site healing well without signs of infection scalp came off this am.     Follow up appointment with  Dr. Erroll Luna 06-07-17  Pain issues, if any: No  SAFETY ISSUES:  Prior radiation? No  Pacemaker/ICD? No  Possible current pregnancy? No, hysterectomy 1974  Is the patient on methotrexate? No  Current Complaints / other details:     Wt Readings from Last 3 Encounters:  06/19/17 236 lb 12.8 oz (107.4 kg)  06/19/17 236 lb 12.8 oz (107.4 kg)  06/12/17 239 lb (108.4 kg)  BP (!) 141/67 (BP Location: Left Arm, Patient Position: Sitting, Cuff Size:  Large)   Pulse 86   Temp 97.7 F (36.5 C) (Oral)   Resp 18   Ht _0  (1.702 m)   Wt 236 lb 12.8 oz (107.4 kg)   SpO2 97%   BMI 37.09 kg/m   Cori Razor, RN 06/17/2017,10:31 AM

## 2017-06-18 ENCOUNTER — Ambulatory Visit: Payer: Medicare Other

## 2017-06-18 DIAGNOSIS — M25611 Stiffness of right shoulder, not elsewhere classified: Secondary | ICD-10-CM

## 2017-06-18 DIAGNOSIS — R293 Abnormal posture: Secondary | ICD-10-CM | POA: Diagnosis not present

## 2017-06-18 DIAGNOSIS — Z483 Aftercare following surgery for neoplasm: Secondary | ICD-10-CM

## 2017-06-18 NOTE — Patient Instructions (Signed)

## 2017-06-18 NOTE — Therapy (Signed)
[place], [place], [address] Phone: [phone]   Fax:  [phone]  Physical Therapy Treatment  Patient Details  Name: Tara Cox MRN: [id number] Date of Birth: 1934/08/27 Referring Provider: Dr. Erroll Luna   Encounter Date: 06/18/2017  PT End of Session - 06/18/17 1104    Visit Number  4    Number of Visits  10    Date for PT Re-Evaluation  07/04/17    PT Start Time  1017    PT Stop Time  1103    PT Time Calculation (min)  46 min    Activity Tolerance  Patient tolerated treatment well    Behavior During Therapy  [facility] for tasks assessed/performed       Past Medical History:  Diagnosis Date  . A-fib ([place])   . Arthritis   . 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 DDD (degenerative disc disease), lumbar 12/08/2015   S/P discectomy   . Diaphragmatic hernia 12/08/2015  . Dysrhythmia    A-Fib  . Edema of lower extremity 06/29/09   Lower Venous Exam- normal. No evidence of thrombus or thrombophlebitis.  . Esophagitis 12/08/2015  . Frequent urination at night   . GERD (gastroesophageal reflux disease)   . Hemorrhoids   . History of calcium pyrophosphate deposition disease (CPPD) 12/08/2015  . History of miscarriage 12/08/2015   x3  . History of total right knee replacement 12/08/2015  . Hypertension 02/24/09   Echo-EF 62%; Myocardial Perfusion Study-Normal. No signifcant ischemia noted.  . IBS (irritable bowel syndrome)   . Migraine   . Osteoarthritis of both feet 12/08/2015  . Osteoarthritis of both hands 12/08/2015  . PONV (postoperative nausea and vomiting)   . Uterine fibroid 12/08/2015    Past Surgical History:  Procedure Laterality Date  . ABDOMINAL HYSTERECTOMY  1974  . APPENDECTOMY    . BACK SURGERY  2001  . BUNIONECTOMY  1986  . CESAREAN SECTION  1971  . COLONOSCOPY    . COLONOSCOPY WITH PROPOFOL N/A 05/28/2014   Procedure: COLONOSCOPY WITH PROPOFOL;  Surgeon: Carol Ada, MD;  Location: [facility];  Service: Endoscopy;  Laterality: N/A;  . ESOPHAGOGASTRODUODENOSCOPY (EGD) WITH PROPOFOL N/A 05/28/2014   Procedure: ESOPHAGOGASTRODUODENOSCOPY (EGD) WITH PROPOFOL;  Surgeon: Carol Ada, MD;  Location: [facility];  Service: Endoscopy;  Laterality: N/A;  . EXPLORATORY LAPAROTOMY     open procedure  . EYE SURGERY Bilateral    cataract removal  . PORTACATH PLACEMENT Right 05/07/2017   Procedure: INSERTION PORT-A-CATH;  Surgeon: Erroll Luna, MD;  Location: [facility];  Service: General;  Laterality: Right;  . SHOULDER SURGERY Right   . SIMPLE MASTECTOMY WITH AXILLARY SENTINEL NODE BIOPSY Right 05/07/2017   Procedure: RIGHT SIMPLE MASTECTOMY WITH AXILLARY SENTINEL NODE BIOPSY;  Surgeon: Erroll Luna, MD;  Location: [place];  Service: General;  Laterality: Right;  . TOTAL KNEE ARTHROPLASTY Right 01/26/2013   DR Ronnie Derby  . TOTAL KNEE ARTHROPLASTY Right 01/26/2013   Procedure: TOTAL KNEE ARTHROPLASTY;  Surgeon: Vickey Huger, MD;  Location: [facility];  Service: Orthopedics;  Laterality: Right;  . UTERINE FIBROID SURGERY  1967    There were no vitals filed for this visit.  Subjective Assessment - 06/18/17 1023    Subjective  I've been doing my exercises at home. I'm familiar with them because I've done some before when I've had PT for same problem.    Pertinent History  Right mastectomy and sentinel node biopsy (2/4 positive nodes) on 05/07/17. Port  placed for Herceptin. Right rotator cuff repair 3/18. Lumbar discectomy 2001 and right TKR 2014.    Patient Stated Goals  See if my arm is ok    Currently in Pain?  No/denies                       [facility] Adult PT Treatment/Exercise - 06/18/17 0001      Shoulder Exercises: Supine   External Rotation  AAROM;Right;5 reps;Limitations with dowel and 5 sec holds    External Rotation Limitations  Then with fingers clasped behind head and abducting elbows    Flexion  AAROM;Both;5 reps 5 sec holds with dowel     ABduction  AAROM;Right;5 reps 5 sec holds with dowel      Shoulder Exercises: Pulleys   Flexion  2 minutes;Limitations    Flexion Limitations  Pt returned therapist demonstration but did require tactile and VCs for correct technique    ABduction  2 minutes;Limitations    ABduction Limitations  Pt returned therapist demonstration but tactile cuing to decrease Rt shoulder hike      Shoulder Exercises: Therapy Ball   Flexion  Both;5 reps;Limitations Forward lean into end of stretch      Shoulder Exercises: Stretch   Wall Stretch - ABduction  5 reps;Limitations 5 sec holds using finger ladder    Wall Stretch - ABduction Limitations  VCs throughout to remind pt of correct technique/relax Rt shoulder      Manual Therapy   Myofascial Release  To Rt flank during P/ROM     Passive ROM  In supine to left shoulder with movement into er, abduction, and flexion, gentle stretches progressing slowly. Also stretch into position for radiation.             PT Education - 06/18/17 1050    Education provided  Yes    Education Details  Supine dowel exercises    Person(s) Educated  Patient    Methods  Explanation;Demonstration;Handout    Comprehension  Verbalized understanding;Returned demonstration          PT Long Term Goals - 06/06/17 1238      PT LONG TERM GOAL #1   Title  Patient will demonstrate she has returned to baseline related to shoulder ROM and function post operatively.    Time  4    Period  Weeks    Status  On-going      PT LONG TERM GOAL #2   Title  Patient will improve DASH score to be </= 10 for improved shoulder function.    Time  4    Period  Weeks    Status  New      PT LONG TERM GOAL #3   Title  Increase left shoulder flexion to >/= 145 degrees for increased ease reaching overhead.    Time  4    Period  Weeks    Status  New      PT LONG TERM GOAL #4   Title  Increase left shoulder abduction to >/= 135 degrees for increased ease reaching overhead.    Time   4    Period  Weeks    Status  New      PT LONG TERM GOAL #5   Title  Patient will verbalize good understanding of lymphedema risk reduction practices.    Time  4    Period  Weeks    Status  New      Breast Clinic Goals - 04/10/17  1208      Patient will be able to verbalize understanding of pertinent lymphedema risk reduction practices relevant to her diagnosis specifically related to skin care.   Time  1    Period  Days    Status  Achieved      Patient will be able to return demonstrate and/or verbalize understanding of the post-op home exercise program related to regaining shoulder range of motion.   Time  1    Period  Days    Status  Achieved      Patient will be able to verbalize understanding of the importance of attending the postoperative After Breast Cancer Class for further lymphedema risk reduction education and therapeutic exercise.   Time  1    Period  Days    Status  Achieved           Plan - 06/18/17 1248    Clinical Impression Statement  Began pt with AA/ROM exercises today which she tolerated well though she did require  multiple tactile and VCs for proper technique with relaxing Rt shoulder and scapula. Pt was able to begin returning correct demonstration after multiple attempts. Added supine dowel exercises whic she tolerated well.    Rehab Potential  Excellent    Clinical Impairments Affecting Rehab Potential  Previous rotator cuff repair on right side    PT Frequency  2x / week    PT Duration  4 weeks    PT Treatment/Interventions  ADLs/Self Care Home Management;Therapeutic exercise;Patient/family education;Manual techniques;Passive range of motion;Therapeutic activities    PT Next Visit Plan  Continue PROM and ROM exercises; review HEP    Consulted and Agree with Plan of Care  Patient       Patient will benefit from skilled therapeutic intervention in order to improve the following deficits and impairments:  Pain, Decreased range of motion, Decreased  knowledge of precautions, Impaired UE functional use, Postural dysfunction  Visit Diagnosis: Abnormal posture  Stiffness of right shoulder, not elsewhere classified  Aftercare following surgery for neoplasm     Problem List Patient Active Problem List   Diagnosis Date Noted  . Breast cancer, stage 2, right ([place]) 05/07/2017  . Malignant neoplasm of central portion of right breast in female, estrogen receptor positive ([place]) 04/02/2017  . Primary insomnia 09/27/2016  . ANA positive 09/27/2016  . Complete tear of right rotator cuff 05/11/2016  . High risk medication use 05/07/2016  . Uterine fibroid 12/08/2015  . History of miscarriage 12/08/2015  . Esophagitis 12/08/2015  . Diaphragmatic hernia 12/08/2015  . Calcium pyrophosphate deposition disease 12/08/2015  . Osteoarthritis of both hands 12/08/2015  . Osteoarthritis of both feet 12/08/2015  . History of total right knee replacement 12/08/2015  . Chondromalacia of both patellae 12/08/2015  . DDD (degenerative disc disease), lumbar 12/08/2015  . Essential hypertension 06/26/2013  . Paroxysmal atrial fibrillation ([place]) 06/26/2013  . Hyperlipidemia 06/26/2013  . DJD (degenerative joint disease) of knee 01/26/2013  . Hemorrhoids 07/11/2010  . IBS (irritable bowel syndrome) 07/11/2010  . Fatigue/loss of sleep 07/11/2010  . Night sweats 07/11/2010  . Chills 07/11/2010  . Weight gain 07/11/2010  . Inflammatory arthritis 07/11/2010  . Incontinence of urine 07/11/2010  . Thrombosed external hemorrhoids 07/11/2010    Otelia Limes, PTA 06/18/2017, 1:03 PM  [place], [place], [address] Phone: [phone]   Fax:  [phone]  Name: DANYLLE OUK MRN: [id number] Date of Birth: 02-08-1934

## 2017-06-19 ENCOUNTER — Encounter: Payer: Self-pay | Admitting: Radiation Oncology

## 2017-06-19 ENCOUNTER — Ambulatory Visit
Admission: RE | Admit: 2017-06-19 | Discharge: 2017-06-19 | Disposition: A | Discharge status: Home / Self Care | Payer: Medicare Other | Source: Ambulatory Visit | Attending: Radiation Oncology | Admitting: Radiation Oncology

## 2017-06-19 ENCOUNTER — Other Ambulatory Visit: Payer: Self-pay

## 2017-06-19 VITALS — BP 141/67 | HR 86 | Temp 97.7°F | Resp 18 | Wt 236.8 lb

## 2017-06-19 VITALS — BP 141/67 | HR 86 | Temp 97.7°F | Resp 18 | Ht 67.0 in | Wt 236.8 lb

## 2017-06-19 DIAGNOSIS — M19072 Primary osteoarthritis, left ankle and foot: Secondary | ICD-10-CM | POA: Diagnosis not present

## 2017-06-19 DIAGNOSIS — Z7901 Long term (current) use of anticoagulants: Secondary | ICD-10-CM | POA: Diagnosis not present

## 2017-06-19 DIAGNOSIS — K219 Gastro-esophageal reflux disease without esophagitis: Secondary | ICD-10-CM | POA: Insufficient documentation

## 2017-06-19 DIAGNOSIS — Z7982 Long term (current) use of aspirin: Secondary | ICD-10-CM | POA: Diagnosis not present

## 2017-06-19 DIAGNOSIS — M19042 Primary osteoarthritis, left hand: Secondary | ICD-10-CM | POA: Diagnosis not present

## 2017-06-19 DIAGNOSIS — Z170 Estrogen receptor positive status [ER+]: Secondary | ICD-10-CM | POA: Diagnosis present

## 2017-06-19 DIAGNOSIS — I10 Essential (primary) hypertension: Secondary | ICD-10-CM | POA: Diagnosis not present

## 2017-06-19 DIAGNOSIS — Z8249 Family history of ischemic heart disease and other diseases of the circulatory system: Secondary | ICD-10-CM | POA: Diagnosis not present

## 2017-06-19 DIAGNOSIS — Z96651 Presence of right artificial knee joint: Secondary | ICD-10-CM | POA: Insufficient documentation

## 2017-06-19 DIAGNOSIS — Z885 Allergy status to narcotic agent status: Secondary | ICD-10-CM | POA: Diagnosis not present

## 2017-06-19 DIAGNOSIS — C50111 Malignant neoplasm of central portion of right female breast: Secondary | ICD-10-CM | POA: Diagnosis not present

## 2017-06-19 DIAGNOSIS — M19041 Primary osteoarthritis, right hand: Secondary | ICD-10-CM | POA: Diagnosis not present

## 2017-06-19 DIAGNOSIS — K449 Diaphragmatic hernia without obstruction or gangrene: Secondary | ICD-10-CM | POA: Insufficient documentation

## 2017-06-19 DIAGNOSIS — K589 Irritable bowel syndrome without diarrhea: Secondary | ICD-10-CM | POA: Diagnosis not present

## 2017-06-19 DIAGNOSIS — I4891 Unspecified atrial fibrillation: Secondary | ICD-10-CM | POA: Diagnosis not present

## 2017-06-19 DIAGNOSIS — Z9011 Acquired absence of right breast and nipple: Secondary | ICD-10-CM | POA: Diagnosis not present

## 2017-06-19 DIAGNOSIS — Z87891 Personal history of nicotine dependence: Secondary | ICD-10-CM | POA: Diagnosis not present

## 2017-06-19 DIAGNOSIS — Z79899 Other long term (current) drug therapy: Secondary | ICD-10-CM | POA: Insufficient documentation

## 2017-06-19 DIAGNOSIS — M19071 Primary osteoarthritis, right ankle and foot: Secondary | ICD-10-CM | POA: Diagnosis not present

## 2017-06-19 NOTE — Progress Notes (Signed)
Radiation Oncology         (336) [phone] ________________________________  Name: Tara Cox        MRN: [id number]  Date of Service: 06/19/2017 DOB: 1934/08/28  CC:[name], MD  Erroll Luna, MD     REFERRING PHYSICIAN: Erroll Luna, MD   DIAGNOSIS: The encounter diagnosis was Malignant neoplasm of central portion of right breast in female, estrogen receptor positive ([place]).   HISTORY OF PRESENT ILLNESS: Tara Cox is a 83 y.o. female seen in the multidisciplinary breast clinic for a new diagnosis of right breast cancer. The patient was noted to have calcifications in the right breast.  She was supposed to come back for recall but delayed this, and noted several months of right nipple inversion.  She ultimately presented for diagnostic imaging, and was found to have a subareolar mass measuring 2.8 x 2.3 x 1.5 cm, as well as 2 abnormal appearing lymph nodes.  She underwent biopsy on 03/29/2017 revealing a grade 2 invasive lobular carcinoma, ER/PR positive, HER-2 positive, Ki-67 of 25%, and her lymph node that was also sampled was negative.  She underwent right mastectomy with sentinel node biopsy on 05/07/17 which revealed a grade 3 invasive lobular and noninvasive lobular carcinoma measuring 5.8 cm. Margins were negative. She had 4 nodes sampled and two contained metastatic disease. Her tumor was triple positive and she has started Herceptin. She is also going to proceed with antiestrogen therapy following radiation. She comes today to discuss the role of radiotherapy.  PREVIOUS RADIATION THERAPY: No   PAST MEDICAL HISTORY:  Past Medical History:  Diagnosis Date  . A-fib ([place])   . Arthritis   . 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 DDD (degenerative disc disease), lumbar 12/08/2015   S/P discectomy   . Diaphragmatic hernia 12/08/2015  . Dysrhythmia    A-Fib  . Edema of lower extremity 06/29/09   Lower Venous Exam- normal. No evidence of thrombus or thrombophlebitis.  .  Esophagitis 12/08/2015  . Frequent urination at night   . GERD (gastroesophageal reflux disease)   . Hemorrhoids   . History of calcium pyrophosphate deposition disease (CPPD) 12/08/2015  . History of miscarriage 12/08/2015   x3  . History of total right knee replacement 12/08/2015  . Hypertension 02/24/09   Echo-EF 62%; Myocardial Perfusion Study-Normal. No signifcant ischemia noted.  . IBS (irritable bowel syndrome)   . Migraine   . Osteoarthritis of both feet 12/08/2015  . Osteoarthritis of both hands 12/08/2015  . PONV (postoperative nausea and vomiting)   . Uterine fibroid 12/08/2015       PAST SURGICAL HISTORY: Past Surgical History:  Procedure Laterality Date  . ABDOMINAL HYSTERECTOMY  1974  . APPENDECTOMY    . BACK SURGERY  2001  . BUNIONECTOMY  1986  . CESAREAN SECTION  1971  . COLONOSCOPY    . COLONOSCOPY WITH PROPOFOL N/A 05/28/2014   Procedure: COLONOSCOPY WITH PROPOFOL;  Surgeon: Carol Ada, MD;  Location: [facility];  Service: Endoscopy;  Laterality: N/A;  . ESOPHAGOGASTRODUODENOSCOPY (EGD) WITH PROPOFOL N/A 05/28/2014   Procedure: ESOPHAGOGASTRODUODENOSCOPY (EGD) WITH PROPOFOL;  Surgeon: Carol Ada, MD;  [facility];  Service: Endoscopy;  Laterality: N/A;  . EXPLORATORY LAPAROTOMY     open procedure  . EYE SURGERY Bilateral    cataract removal  . PORTACATH PLACEMENT Right 05/07/2017   Procedure: INSERTION PORT-A-CATH;  Surgeon: Erroll Luna, MD;  Location: [place];  Service: General;  Laterality: Right;  . SHOULDER SURGERY Right   .  SIMPLE MASTECTOMY WITH AXILLARY SENTINEL NODE BIOPSY Right 05/07/2017   Procedure: RIGHT SIMPLE MASTECTOMY WITH AXILLARY SENTINEL NODE BIOPSY;  Surgeon: Erroll Luna, MD;  Location: [place];  Service: General;  Laterality: Right;  . TOTAL KNEE ARTHROPLASTY Right 01/26/2013   DR Ronnie Derby  . TOTAL KNEE ARTHROPLASTY Right 01/26/2013   Procedure: TOTAL KNEE ARTHROPLASTY;  Surgeon: Vickey Huger, MD;  Location: [facility];   Service: Orthopedics;  Laterality: Right;  . UTERINE FIBROID SURGERY  1967     FAMILY HISTORY:  Family History  Problem Relation Age of Onset  . Other Mother   . Varicose Veins Mother   . Emphysema Mother   . Heart disease Father        heart attack  . Diabetes Sister   . Other Sister        pacemaker  . Diabetes Sister   . Heart disease Sister      SOCIAL HISTORY:  reports that she quit smoking about 27 years ago. Her smoking use included cigarettes. She has a 20.00 pack-year smoking history. She has never used smokeless tobacco. She reports that she does not drink alcohol or use drugs. She is married and lives in [place]. She is accompanied by her husband.   ALLERGIES: Oxycodone; Hydrocodone; Percocet [oxycodone-acetaminophen]; Soma [carisoprodol]; and Ultram [tramadol hcl]   MEDICATIONS:  Current Outpatient Medications  Medication Sig Dispense Refill  . acetaminophen (TYLENOL) 650 MG CR tablet Take 650 mg by mouth every 8 (eight) hours as needed for pain.    Marland Kitchen anastrozole (ARIMIDEX) 1 MG tablet Take 1 tablet (1 mg total) by mouth daily. 90 tablet 4  . Ascorbic Acid (VITAMIN C WITH ROSE HIPS) 500 MG tablet Take 500 mg by mouth daily.    Marland Kitchen atorvastatin (LIPITOR) 20 MG tablet Take 20 mg by mouth at bedtime.     . Calcium Carbonate-Vit D-Min (CALCIUM 600+D3 PLUS MINERALS) 600-800 MG-UNIT TABS Take 1 tablet by mouth at bedtime.    . Cholecalciferol (VITAMIN D) 2000 units CAPS Take 2,000 Units by mouth daily.    . colchicine 0.6 MG tablet TAKE 1 TABLET DAILY BY  MOUTH. 90 tablet 1  . furosemide (LASIX) 20 MG tablet Take 20 mg by mouth daily.      Marland Kitchen gabapentin (NEURONTIN) 300 MG capsule Take 300 mg by mouth at bedtime.     Marland Kitchen HYDROcodone-acetaminophen (NORCO/VICODIN) 5-325 MG tablet Take 1 tablet by mouth every 6 (six) hours as needed for moderate pain. 20 tablet 0  . hydrocortisone (ANUSOL-HC) 2.5 % rectal cream Place 1 application rectally 2 (two) times daily as needed for  hemorrhoids or itching.    . hydroxychloroquine (PLAQUENIL) 200 MG tablet TAKE 1 TABLET BY MOUTH TWO  TIMES DAILY (Patient taking differently: TAKE 1 TABLET BY MOUTH ONE TIME DAILY) 180 tablet 0  . lidocaine-prilocaine (EMLA) cream Apply 1 application topically as needed. 30 g 0  . losartan-hydrochlorothiazide (HYZAAR) 100-25 MG per tablet Take 1 tablet by mouth daily. IN THE MORNING.    . magnesium oxide (MAG-OX) 400 MG tablet Take 400 mg by mouth daily.     . metoprolol tartrate (LOPRESSOR) 25 MG tablet Take 25 mg by mouth daily with lunch.     . Multiple Vitamin (MULTIVITAMIN) tablet Take 1 tablet by mouth daily. CENTRUM    . NONFORMULARY OR COMPOUNDED ITEM Apply 1-2 g topically 4 (four) times daily as needed for pain. BACLOFEN-DOXEPIN-GABAPENTIN-TOPIRAMATE-PENTOXIFYLLINE  2  . omeprazole (PRILOSEC) 40 MG capsule Take 40 mg by mouth  daily as needed (FOR ACID REFLUX/INDIGESTION).   12  . potassium chloride SA (K-DUR,KLOR-CON) 20 MEQ tablet Take 10 mEq by mouth daily.    . Trastuzumab (HERCEPTIN IV) Inject into the vein every 21 ( twenty-one) days.    Marland Kitchen triamcinolone cream (KENALOG) 0.1 % Apply 1 application topically 2 (two) times daily as needed (Vaginal Itching).    . warfarin (COUMADIN) 5 MG tablet Take 0.5-1 tablets (2.5-5 mg total) by mouth daily. Take 5 mg tablet daily or as directed by pharmacist from [facility] (Patient taking differently: Take 5 mg by mouth at bedtime. Taking according to doctors instructions after each lab check dosages vary.) 40 tablet 1  . aspirin-sod bicarb-citric acid (ALKA-SELTZER) 325 MG TBEF tablet Take 325 mg by mouth every 6 (six) hours as needed.    . benzonatate (TESSALON) 200 MG capsule Take 200 mg by mouth 3 (three) times daily as needed for cough.    . dextromethorphan (DELSYM) 30 MG/5ML liquid Take 30 mg by mouth daily as needed for cough.    . diphenhydrAMINE (BENADRYL) 25 mg capsule Take 25 mg by mouth every 6 (six) hours as needed for allergies.     Marland Kitchen docusate sodium (COLACE) 250 MG capsule Take 250 mg by mouth daily as needed for constipation.    Marland Kitchen OVER THE COUNTER MEDICATION Take 600 mcg by mouth daily as needed (AT ONSET OF MIGRAINE). MYGRAFEW (TANACETUM PARTHENIUM)     No current facility-administered medications for this encounter.      REVIEW OF SYSTEMS: On review of systems, the patient reports that she is doing well overall. She feels like she's healing well. She denies any chest pain, shortness of breath, cough, fevers, chills, night sweats, unintended weight changes. She denies any bowel or bladder disturbances, and denies abdominal pain, nausea or vomiting. She denies any new musculoskeletal or joint aches or pains. A complete review of systems is obtained and is otherwise negative.     PHYSICAL EXAM:  Wt Readings from Last 3 Encounters:  06/19/17 236 lb 12.8 oz (107.4 kg)  06/19/17 236 lb 12.8 oz (107.4 kg)  06/12/17 239 lb (108.4 kg)   Temp Readings from Last 3 Encounters:  06/19/17 97.7 F (36.5 C)  06/19/17 97.7 F (36.5 C) (Oral)  06/11/17 97.8 F (36.6 C) (Oral)   BP Readings from Last 3 Encounters:  06/19/17 (!) 141/67  06/19/17 (!) 141/67  06/12/17 110/65   Pulse Readings from Last 3 Encounters:  06/19/17 86  06/19/17 86  06/12/17 81     In general this is a well appearing African American female in no acute distress. She is alert and oriented x4 and appropriate throughout the examination. HEENT reveals that the patient is normocephalic, atraumatic. EOMs are intact. Skin is intact without any evidence of gross lesions. Cardiopulmonary assessment is negative for acute distress and she exhibits normal effort. The right mastectomy scar is healing well. There is mild edema laterally and fullness in the site that appears to be post surgical. No erythema or RUE edema is noted. .  ECOG = 0  0 - Asymptomatic (Fully active, able to carry on all predisease activities without restriction)  1 - Symptomatic  but completely ambulatory (Restricted in physically strenuous activity but ambulatory and able to carry out work of a light or sedentary nature. For example, light housework, office work)  2 - Symptomatic, <50% in bed during the day (Ambulatory and capable of all self care but unable to carry out any  work activities. Up and about more than 50% of waking hours)  3 - Symptomatic, >50% in bed, but not bedbound (Capable of only limited self-care, confined to bed or chair 50% or more of waking hours)  4 - Bedbound (Completely disabled. Cannot carry on any self-care. Totally confined to bed or chair)  5 - Death   Eustace Pen MM, Creech RH, Tormey DC, et al. [phone]). "Toxicity and response criteria of the [facility]". [facility] On[facility]. 5 (6): 649-55    LABORATORY DATA:  Lab Results  Component Value Date   WBC 5.2 06/11/2017   HGB 13.8 06/11/2017   HCT 43.4 06/11/2017   MCV 84.6 06/11/2017   PLT 184 06/11/2017   Lab Results  Component Value Date   NA 141 06/11/2017   K 3.8 06/11/2017   CL 104 06/11/2017   CO2 27 06/11/2017   Lab Results  Component Value Date   ALT 47 06/11/2017   AST 34 06/11/2017   ALKPHOS 79 06/11/2017   BILITOT 0.4 06/11/2017      RADIOGRAPHY: No results found.     IMPRESSION/PLAN: 1. Stage IIA, pT3N1aM0, grade 2, triple positive invasive lobular carcinoma of the right breast. Dr. Lisbeth Renshaw discusses the final pathology findings and reviews the nature of invasive breast disease. Unfortunately she was upstaged at the time of surgery and due to the large tumor size and nodal involvement, she would benefit from radiotherapy in the adjuvant setting.  We discussed the risks, benefits, short, and long term effects of radiotherapy. We discussed Dr. Ida Rogue recommendation for proceeding with 6 1/2 weeks of radiotherapy to the right chest wall and regional nodes. Written consent is obtained and placed in the chart, a copy was provided to the patient. She  will return for simulation later this week and continue her scheduled Herceptin. 2. Financial Concerns. The patient will be contacted by our authorization staff to answer questions about her upcoming treatment.   In a visit lasting 25 minutes, greater than 50% of the time was spent face to face discussing her case, and coordinating the patient's care.  The above documentation reflects my direct findings during this shared patient visit. Please see the separate note by Dr. Lisbeth Renshaw on this date for the remainder of the patient's plan of care.    Carola Rhine, PAC

## 2017-06-21 ENCOUNTER — Ambulatory Visit: Payer: Medicare Other | Admitting: Physical Therapy

## 2017-06-21 ENCOUNTER — Ambulatory Visit
Admission: RE | Admit: 2017-06-21 | Discharge: 2017-06-21 | Disposition: A | Discharge status: Home / Self Care | Payer: Medicare Other | Source: Ambulatory Visit | Attending: Radiation Oncology | Admitting: Radiation Oncology

## 2017-06-21 DIAGNOSIS — Z483 Aftercare following surgery for neoplasm: Secondary | ICD-10-CM

## 2017-06-21 DIAGNOSIS — C50111 Malignant neoplasm of central portion of right female breast: Secondary | ICD-10-CM | POA: Diagnosis present

## 2017-06-21 DIAGNOSIS — M25611 Stiffness of right shoulder, not elsewhere classified: Secondary | ICD-10-CM

## 2017-06-21 DIAGNOSIS — Z170 Estrogen receptor positive status [ER+]: Secondary | ICD-10-CM

## 2017-06-21 DIAGNOSIS — R293 Abnormal posture: Secondary | ICD-10-CM

## 2017-06-21 DIAGNOSIS — Z510 Encounter for antineoplastic radiation therapy: Secondary | ICD-10-CM | POA: Insufficient documentation

## 2017-06-21 NOTE — Therapy (Signed)
[place], [place], [address] Phone: [phone]   Fax:  [phone]  Physical Therapy Treatment  Patient Details  Name: Tara Cox MRN: [id number] Date of Birth: Oct 18, 1934 Referring Provider: Dr. Erroll Luna   Encounter Date: 06/21/2017  PT End of Session - 06/21/17 1224    Visit Number  5    Number of Visits  10    Date for PT Re-Evaluation  07/04/17    PT Start Time  8588    PT Stop Time  1059    PT Time Calculation (min)  44 min    Activity Tolerance  Patient tolerated treatment well    Behavior During Therapy  [facility] for tasks assessed/performed       Past Medical History:  Diagnosis Date  . A-fib ([place])   . Arthritis   . 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 DDD (degenerative disc disease), lumbar 12/08/2015   S/P discectomy   . Diaphragmatic hernia 12/08/2015  . Dysrhythmia    A-Fib  . Edema of lower extremity 06/29/09   Lower Venous Exam- normal. No evidence of thrombus or thrombophlebitis.  . Esophagitis 12/08/2015  . Frequent urination at night   . GERD (gastroesophageal reflux disease)   . Hemorrhoids   . History of calcium pyrophosphate deposition disease (CPPD) 12/08/2015  . History of miscarriage 12/08/2015   x3  . History of total right knee replacement 12/08/2015  . Hypertension 02/24/09   Echo-EF 62%; Myocardial Perfusion Study-Normal. No signifcant ischemia noted.  . IBS (irritable bowel syndrome)   . Migraine   . Osteoarthritis of both feet 12/08/2015  . Osteoarthritis of both hands 12/08/2015  . PONV (postoperative nausea and vomiting)   . Uterine fibroid 12/08/2015    Past Surgical History:  Procedure Laterality Date  . ABDOMINAL HYSTERECTOMY  1974  . APPENDECTOMY    . BACK SURGERY  2001  . BUNIONECTOMY  1986  . CESAREAN SECTION  1971  . COLONOSCOPY    . COLONOSCOPY WITH PROPOFOL N/A 05/28/2014   Procedure: COLONOSCOPY WITH PROPOFOL;  Surgeon: Carol Ada, MD;  [facility];  Service: Endoscopy;  Laterality: N/A;  . ESOPHAGOGASTRODUODENOSCOPY (EGD) WITH PROPOFOL N/A 05/28/2014   Procedure: ESOPHAGOGASTRODUODENOSCOPY (EGD) WITH PROPOFOL;  Surgeon: Carol Ada, MD;  [facility];  Service: Endoscopy;  Laterality: N/A;  . EXPLORATORY LAPAROTOMY     open procedure  . EYE SURGERY Bilateral    cataract removal  . PORTACATH PLACEMENT Right 05/07/2017   Procedure: INSERTION PORT-A-CATH;  Surgeon: Erroll Luna, MD;  Location: [place];  Service: General;  Laterality: Right;  . SHOULDER SURGERY Right   . SIMPLE MASTECTOMY WITH AXILLARY SENTINEL NODE BIOPSY Right 05/07/2017   Procedure: RIGHT SIMPLE MASTECTOMY WITH AXILLARY SENTINEL NODE BIOPSY;  Surgeon: Erroll Luna, MD;  Location: [place];  Service: General;  Laterality: Right;  . TOTAL KNEE ARTHROPLASTY Right 01/26/2013   DR Ronnie Derby  . TOTAL KNEE ARTHROPLASTY Right 01/26/2013   Procedure: TOTAL KNEE ARTHROPLASTY;  Surgeon: Vickey Huger, MD;  Location: [place];  Service: Orthopedics;  Laterality: Right;  . UTERINE FIBROID SURGERY  1967    There were no vitals filed for this visit.  Subjective Assessment - 06/21/17 1017    Subjective  I had another appointment this morning; I just had the simulation. It went well.  Did fine with the positioning.  Will start radiation Monday.    Pertinent History  Right mastectomy and sentinel node biopsy (2/4 positive nodes) on  05/07/17. Port placed for Herceptin. Right rotator cuff repair 3/18. Lumbar discectomy 2001 and right TKR 2014.    Patient Stated Goals  See if my arm is ok    Currently in Pain?  Yes    Pain Score  2     Pain Location  Back    Pain Orientation  Mid    Pain Descriptors / Indicators  Other (Comment) "pain pain"    Aggravating Factors   sitting with back flexed, such as in the recliner    Pain Relieving Factors  get back to stretching like I used to         [facility] PT Assessment - 06/21/17 0001      AROM   Right  Shoulder Flexion  120 Degrees    Right Shoulder ABduction  106 Degrees                   [facility] Adult PT Treatment/Exercise - 06/21/17 0001      Shoulder Exercises: Supine   External Rotation  AAROM;Right;5 reps;Limitations with dowel and 5 sec holds    External Rotation Limitations  Then with fingers clasped behind head and horizontally abducting elbows    Flexion  AAROM;Both;5 reps 5 sec holds with dowel    ABduction  AAROM;Right;5 reps 5 sec holds with dowel      Manual Therapy   Soft tissue mobilization  around right mastectomy incision, in all directions    Myofascial Release  unidirectional release in superior direction just superior to mastectomy scar; crosshands across mastectomy scar area in diagonal direction all avoiding Portacath area    Passive ROM  in supine to right shoulder with stretch to tolerance into er, abduction, flexion, and position for radiation             PT Education - 06/21/17 1224    Education provided  Yes    Education Details  reviewed supine dowel exercises    Person(s) Educated  Patient    Methods  Explanation;Tactile cues;Verbal cues    Comprehension  Verbalized understanding;Returned demonstration          PT Long Term Goals - 06/21/17 1032      PT LONG TERM GOAL #1   Title  Patient will demonstrate she has returned to baseline related to shoulder ROM and function post operatively.    Status  On-going      PT LONG TERM GOAL #2   Title  Patient will improve DASH score to be </= 10 for improved shoulder function.    Status  On-going      PT LONG TERM GOAL #3   Title  Increase left shoulder flexion to >/= 145 degrees for increased ease reaching overhead.    Baseline  increased to 120 on 06/21/17    Status  Partially Met      PT LONG TERM GOAL #4   Title  Increase left shoulder abduction to >/= 135 degrees for increased ease reaching overhead.    Baseline  Increased to 106 on 06/20/17    Status  Partially Met      PT  LONG TERM GOAL #5   Title  Patient will verbalize good understanding of lymphedema risk reduction practices.    Status  On-going      Breast Clinic Goals - 04/10/17 1208      Patient will be able to verbalize understanding of pertinent lymphedema risk reduction practices relevant to her diagnosis specifically related to skin care.  Time  1    Period  Days    Status  Achieved      Patient will be able to return demonstrate and/or verbalize understanding of the post-op home exercise program related to regaining shoulder range of motion.   Time  1    Period  Days    Status  Achieved      Patient will be able to verbalize understanding of the importance of attending the postoperative After Breast Cancer Class for further lymphedema risk reduction education and therapeutic exercise.   Time  1    Period  Days    Status  Achieved           Plan - 06/21/17 1225    Clinical Impression Statement  Pt. has made nice (>10 degree) gains in right shoulder flexion and abduction; goals not yet met for those, but good progress toward them. She needed review of new supine dowel HEP and was given that today.    Rehab Potential  Excellent    Clinical Impairments Affecting Rehab Potential  Previous rotator cuff repair on right side    PT Frequency  2x / week    PT Duration  4 weeks    PT Treatment/Interventions  ADLs/Self Care Home Management;Therapeutic exercise;Patient/family education;Manual techniques;Passive range of motion;Therapeutic activities    PT Next Visit Plan  Continue P/AA/AROM, myofascial release and soft tissue mobilization    PT Home Exercise Plan  Post op shoulder ROM HEP, supine dowel stretches for right shoulder    Consulted and Agree with Plan of Care  Patient       Patient will benefit from skilled therapeutic intervention in order to improve the following deficits and impairments:  Pain, Decreased range of motion, Decreased knowledge of precautions, Impaired UE functional  use, Postural dysfunction  Visit Diagnosis: Abnormal posture  Stiffness of right shoulder, not elsewhere classified  Aftercare following surgery for neoplasm     Problem List Patient Active Problem List   Diagnosis Date Noted  . Breast cancer, stage 2, right ([place]) 05/07/2017  . Malignant neoplasm of central portion of right breast in female, estrogen receptor positive ([place]) 04/02/2017  . Primary insomnia 09/27/2016  . ANA positive 09/27/2016  . Complete tear of right rotator cuff 05/11/2016  . High risk medication use 05/07/2016  . Uterine fibroid 12/08/2015  . History of miscarriage 12/08/2015  . Esophagitis 12/08/2015  . Diaphragmatic hernia 12/08/2015  . Calcium pyrophosphate deposition disease 12/08/2015  . Osteoarthritis of both hands 12/08/2015  . Osteoarthritis of both feet 12/08/2015  . History of total right knee replacement 12/08/2015  . Chondromalacia of both patellae 12/08/2015  . DDD (degenerative disc disease), lumbar 12/08/2015  . Essential hypertension 06/26/2013  . Paroxysmal atrial fibrillation ([place]) 06/26/2013  . Hyperlipidemia 06/26/2013  . DJD (degenerative joint disease) of knee 01/26/2013  . Hemorrhoids 07/11/2010  . IBS (irritable bowel syndrome) 07/11/2010  . Fatigue/loss of sleep 07/11/2010  . Night sweats 07/11/2010  . Chills 07/11/2010  . Weight gain 07/11/2010  . Inflammatory arthritis 07/11/2010  . Incontinence of urine 07/11/2010  . Thrombosed external hemorrhoids 07/11/2010    Tara Cox 06/21/2017, 12:28 PM  [place], [place], [address] Phone: [phone]   Fax:  [phone]  Name: Tara Cox MRN: [id number] Date of Birth: 1934-07-26  Serafina Royals, PT 06/21/17 12:28 PM

## 2017-06-24 ENCOUNTER — Ambulatory Visit: Payer: Medicare Other

## 2017-06-24 DIAGNOSIS — R293 Abnormal posture: Secondary | ICD-10-CM

## 2017-06-24 DIAGNOSIS — Z483 Aftercare following surgery for neoplasm: Secondary | ICD-10-CM

## 2017-06-24 DIAGNOSIS — M25611 Stiffness of right shoulder, not elsewhere classified: Secondary | ICD-10-CM

## 2017-06-24 NOTE — Progress Notes (Signed)
  Radiation Oncology         (336) [phone] ________________________________  Name: Tara Cox MRN: [id number]  Date: 06/21/2017  DOB: June 16, 1934  Optical Surface Tracking Plan:  Since intensity modulated radiotherapy (IMRT) and 3D conformal radiation treatment methods are predicated on accurate and precise positioning for treatment, intrafraction motion monitoring is medically necessary to ensure accurate and safe treatment delivery.  The ability to quantify intrafraction motion without excessive ionizing radiation dose can only be performed with optical surface tracking. Accordingly, surface imaging offers the opportunity to obtain 3D measurements of patient position throughout IMRT and 3D treatments without excessive radiation exposure.  I am ordering optical surface tracking for this patient's upcoming course of radiotherapy. ________________________________  Kyung Rudd, MD 06/24/2017 8:00 AM    Reference:   Ursula Alert, J, et al. Surface imaging-based analysis of intrafraction motion for breast radiotherapy patients.Journal of [place], n. 6, nov. 2014. ISSN [id number].   Available at: <http://www.jacmp.org/index.php/jacmp/article/view/4957>.

## 2017-06-24 NOTE — Therapy (Signed)
[place], [place], [address] Phone: [phone]   Fax:  [phone]  Physical Therapy Treatment  Patient Details  Name: Tara Cox MRN: [id number] Date of Birth: [birth year] Referring Provider: Dr. Erroll Luna   Encounter Date: 06/24/2017  PT End of Session - 06/24/17 1007    Visit Number  6    Number of Visits  10    Date for PT Re-Evaluation  07/04/17    PT Start Time  0930    PT Stop Time  1018    PT Time Calculation (min)  48 min    Activity Tolerance  Patient tolerated treatment well    Behavior During Therapy  [facility] for tasks assessed/performed       Past Medical History:  Diagnosis Date  . A-fib ([place])   . Arthritis   . 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 DDD (degenerative disc disease), lumbar 12/08/2015   S/P discectomy   . Diaphragmatic hernia 12/08/2015  . Dysrhythmia    A-Fib  . Edema of lower extremity 06/29/09   Lower Venous Exam- normal. No evidence of thrombus or thrombophlebitis.  . Esophagitis 12/08/2015  . Frequent urination at night   . GERD (gastroesophageal reflux disease)   . Hemorrhoids   . History of calcium pyrophosphate deposition disease (CPPD) 12/08/2015  . History of miscarriage 12/08/2015   x3  . History of total right knee replacement 12/08/2015  . Hypertension 02/24/09   Echo-EF 62%; Myocardial Perfusion Study-Normal. No signifcant ischemia noted.  . IBS (irritable bowel syndrome)   . Migraine   . Osteoarthritis of both feet 12/08/2015  . Osteoarthritis of both hands 12/08/2015  . PONV (postoperative nausea and vomiting)   . Uterine fibroid 12/08/2015    Past Surgical History:  Procedure Laterality Date  . ABDOMINAL HYSTERECTOMY  1974  . APPENDECTOMY    . BACK SURGERY  2001  . BUNIONECTOMY  1986  . CESAREAN SECTION  1971  . COLONOSCOPY    . COLONOSCOPY WITH PROPOFOL N/A 05/28/2014   Procedure: COLONOSCOPY WITH PROPOFOL;  Surgeon: Carol Ada, MD;  Location: [facility];  Service: Endoscopy;  Laterality: N/A;  . ESOPHAGOGASTRODUODENOSCOPY (EGD) WITH PROPOFOL N/A 05/28/2014   Procedure: ESOPHAGOGASTRODUODENOSCOPY (EGD) WITH PROPOFOL;  Surgeon: Carol Ada, MD;  Location: [facility];  Service: Endoscopy;  Laterality: N/A;  . EXPLORATORY LAPAROTOMY     open procedure  . EYE SURGERY Bilateral    cataract removal  . PORTACATH PLACEMENT Right 05/07/2017   Procedure: INSERTION PORT-A-CATH;  Surgeon: Erroll Luna, MD;  Location: [facility];  Service: General;  Laterality: Right;  . SHOULDER SURGERY Right   . SIMPLE MASTECTOMY WITH AXILLARY SENTINEL NODE BIOPSY Right 05/07/2017   Procedure: RIGHT SIMPLE MASTECTOMY WITH AXILLARY SENTINEL NODE BIOPSY;  Surgeon: Erroll Luna, MD;  Location: [facility];  Service: General;  Laterality: Right;  . TOTAL KNEE ARTHROPLASTY Right 01/26/2013   DR Ronnie Derby  . TOTAL KNEE ARTHROPLASTY Right 01/26/2013   Procedure: TOTAL KNEE ARTHROPLASTY;  Surgeon: Vickey Huger, MD;  Location: [facility];  Service: Orthopedics;  Laterality: Right;  . UTERINE FIBROID SURGERY  1967    There were no vitals filed for this visit.  Subjective Assessment - 06/24/17 0944    Subjective  I had to cancel my Wednesday appt because we have to go out of town last minute to [place] for my husbands sister who just got placed in hospice. I start radiation next week and with just being busy want to  come back just one more time and then maybe hold off until I'm done with radiation.      Pertinent History  Right mastectomy and sentinel node biopsy (2/4 positive nodes) on 05/07/17. Port placed for Herceptin. Right rotator cuff repair 3/18. Lumbar discectomy 2001 and right TKR 2014.    Patient Stated Goals  See if my arm is ok    Currently in Pain?  No/denies         [facility] PT Assessment - 06/24/17 0001      AROM   Right Shoulder Flexion  139 Degrees    Right Shoulder ABduction  126 Degrees                   [facility] Adult PT  Treatment/Exercise - 06/24/17 0001      Shoulder Exercises: Pulleys   Flexion  2 minutes    ABduction  2 minutes      Manual Therapy   Soft tissue mobilization  around right mastectomy incision, in all directions    Myofascial Release  unidirectional release in superior direction just superior to mastectomy scar; crosshands across mastectomy scar area in diagonal direction; all avoiding port area    Passive ROM  in supine to right shoulder with stretch to tolerance into er, abduction, flexion, and position for radiation                  PT Long Term Goals - 06/21/17 1032      PT LONG TERM GOAL #1   Title  Patient will demonstrate she has returned to baseline related to shoulder ROM and function post operatively.    Status  On-going      PT LONG TERM GOAL #2   Title  Patient will improve DASH score to be </= 10 for improved shoulder function.    Status  On-going      PT LONG TERM GOAL #3   Title  Increase left shoulder flexion to >/= 145 degrees for increased ease reaching overhead.    Baseline  increased to 120 on 06/21/17    Status  Partially Met      PT LONG TERM GOAL #4   Title  Increase left shoulder abduction to >/= 135 degrees for increased ease reaching overhead.    Baseline  Increased to 106 on 06/20/17    Status  Partially Met      PT LONG TERM GOAL #5   Title  Patient will verbalize good understanding of lymphedema risk reduction practices.    Status  On-going      Breast Clinic Goals - 04/10/17 1208      Patient will be able to verbalize understanding of pertinent lymphedema risk reduction practices relevant to her diagnosis specifically related to skin care.   Time  1    Period  Days    Status  Achieved      Patient will be able to return demonstrate and/or verbalize understanding of the post-op home exercise program related to regaining shoulder range of motion.   Time  1    Period  Days    Status  Achieved      Patient will be able to verbalize  understanding of the importance of attending the postoperative After Breast Cancer Class for further lymphedema risk reduction education and therapeutic exercise.   Time  1    Period  Days    Status  Achieved           Plan -  06/24/17 1050    Clinical Impression Statement  Pt has made even further gains from last weeks ROM measurements and reports feeling good at home with ADLs and not limited by ROM. Spent time at beginning of session discussing pts concerns of starting radiation next week and continuing with therapy. She decided she would like to come back 7/3 (not an available appt before then that worked with her radiation) and then potentially be on hold after that visit until radiation complete unless tightness arises during radiation and she can call to make an appt.     Rehab Potential  Excellent    Clinical Impairments Affecting Rehab Potential  Previous rotator cuff repair on right side    PT Frequency  2x / week    PT Duration  4 weeks    PT Treatment/Interventions  ADLs/Self Care Home Management;Therapeutic exercise;Patient/family education;Manual techniques;Passive range of motion;Therapeutic activities    PT Next Visit Plan  Continue P/AA/AROM, myofascial release and soft tissue mobilization; pt considering being on hold during radiation after next visit. Reassess goals.    Consulted and Agree with Plan of Care  Patient       Patient will benefit from skilled therapeutic intervention in order to improve the following deficits and impairments:  Pain, Decreased range of motion, Decreased knowledge of precautions, Impaired UE functional use, Postural dysfunction  Visit Diagnosis: Abnormal posture  Stiffness of right shoulder, not elsewhere classified  Aftercare following surgery for neoplasm     Problem List Patient Active Problem List   Diagnosis Date Noted  . Breast cancer, stage 2, right ([place]) 05/07/2017  . Malignant neoplasm of central portion of right breast in  female, estrogen receptor positive ([place]) 04/02/2017  . Primary insomnia 09/27/2016  . ANA positive 09/27/2016  . Complete tear of right rotator cuff 05/11/2016  . High risk medication use 05/07/2016  . Uterine fibroid 12/08/2015  . History of miscarriage 12/08/2015  . Esophagitis 12/08/2015  . Diaphragmatic hernia 12/08/2015  . Calcium pyrophosphate deposition disease 12/08/2015  . Osteoarthritis of both hands 12/08/2015  . Osteoarthritis of both feet 12/08/2015  . History of total right knee replacement 12/08/2015  . Chondromalacia of both patellae 12/08/2015  . DDD (degenerative disc disease), lumbar 12/08/2015  . Essential hypertension 06/26/2013  . Paroxysmal atrial fibrillation ([place]) 06/26/2013  . Hyperlipidemia 06/26/2013  . DJD (degenerative joint disease) of knee 01/26/2013  . Hemorrhoids 07/11/2010  . IBS (irritable bowel syndrome) 07/11/2010  . Fatigue/loss of sleep 07/11/2010  . Night sweats 07/11/2010  . Chills 07/11/2010  . Weight gain 07/11/2010  . Inflammatory arthritis 07/11/2010  . Incontinence of urine 07/11/2010  . Thrombosed external hemorrhoids 07/11/2010    Otelia Limes, PTA 06/24/2017, 10:54 AM  [place], [place], [address] Phone: [phone]   Fax:  [phone]  Name: Tara Cox MRN: [id number] Date of Birth: December 09, 1934

## 2017-06-24 NOTE — Progress Notes (Signed)
  Radiation Oncology         ([phone]) [phone] ________________________________  Name: Tara Cox MRN: [id number]  Date: 06/21/2017  DOB: Jul 20, 1934  DIAGNOSIS:     ICD-10-CM   1. Malignant neoplasm of central portion of right breast in female, estrogen receptor positive ([place]) C50.111    Z17.0      SIMULATION AND TREATMENT PLANNING NOTE  The patient presented for simulation prior to beginning her course of radiation treatment for her diagnosis of right-sided breast cancer. The patient was placed in a supine position on a breast board. A customized vac-lock bag was also constructed and this complex treatment device will be used on a daily basis during her treatment. In this fashion, a CT scan was obtained through the chest area and an isocenter was placed near the chest wall at the upper aspect of the right chest.  The patient will be planned to receive a course of radiation initially to a dose of 50.4 gray. This will consist of a 4 field technique targeting the right chest wall as well as the supraclavicular region. Therefore 2 customized medial and lateral tangent fields have been created targeting the chest wall, and also 2 additional customized fields have been designed to treat the supraclavicular region both with a right supraclavicular field and a right posterior axillary boost field. A forward planning/reduced field technique will also be evaluated to determine if this significantly improves the dose homogeneity of the overall plan. Therefore, additional customized blocks/fields may be necessary.  This initial treatment will be accomplished at 1.8 gray per fraction.   The initial plan will consist of a 3-D conformal technique. The target volume/scar, heart and lungs have been contoured and dose volume histograms of each of these structures will be evaluated as part of the 3-D conformal treatment planning process.   It is anticipated that the patient will then receive a 10 gray boost to the  surgical scar. This will be accomplished at 2 gray per fraction. The final anticipated total dose therefore will correspond to 60.4 gray.    _______________________________   Jodelle Gross, MD, PhD

## 2017-06-26 ENCOUNTER — Ambulatory Visit: Payer: Medicare Other | Admitting: Physical Therapy

## 2017-06-27 DIAGNOSIS — Z510 Encounter for antineoplastic radiation therapy: Secondary | ICD-10-CM | POA: Diagnosis not present

## 2017-07-01 ENCOUNTER — Ambulatory Visit
Admission: RE | Admit: 2017-07-01 | Discharge: 2017-07-01 | Disposition: A | Discharge status: Home / Self Care | Payer: Medicare Other | Source: Ambulatory Visit | Attending: Radiation Oncology | Admitting: Radiation Oncology

## 2017-07-01 DIAGNOSIS — Z510 Encounter for antineoplastic radiation therapy: Secondary | ICD-10-CM | POA: Diagnosis not present

## 2017-07-02 ENCOUNTER — Telehealth: Payer: Self-pay | Admitting: Oncology

## 2017-07-02 ENCOUNTER — Telehealth: Payer: Self-pay

## 2017-07-02 ENCOUNTER — Inpatient Hospital Stay: Payer: Medicare Other

## 2017-07-02 ENCOUNTER — Inpatient Hospital Stay: Payer: Medicare Other | Admitting: Adult Health

## 2017-07-02 ENCOUNTER — Ambulatory Visit
Admission: RE | Admit: 2017-07-02 | Discharge: 2017-07-02 | Disposition: A | Discharge status: Home / Self Care | Payer: Medicare Other | Source: Ambulatory Visit | Attending: Radiation Oncology | Admitting: Radiation Oncology

## 2017-07-02 ENCOUNTER — Encounter: Payer: Self-pay | Admitting: Adult Health

## 2017-07-02 ENCOUNTER — Telehealth: Payer: Self-pay | Admitting: Adult Health

## 2017-07-02 ENCOUNTER — Other Ambulatory Visit: Payer: Self-pay | Admitting: Oncology

## 2017-07-02 VITALS — BP 144/68 | HR 63 | Temp 98.3°F | Resp 18 | Ht 67.0 in | Wt 238.0 lb

## 2017-07-02 DIAGNOSIS — C50111 Malignant neoplasm of central portion of right female breast: Secondary | ICD-10-CM

## 2017-07-02 DIAGNOSIS — Z510 Encounter for antineoplastic radiation therapy: Secondary | ICD-10-CM | POA: Diagnosis not present

## 2017-07-02 DIAGNOSIS — R2231 Localized swelling, mass and lump, right upper limb: Secondary | ICD-10-CM | POA: Diagnosis not present

## 2017-07-02 DIAGNOSIS — Z170 Estrogen receptor positive status [ER+]: Principal | ICD-10-CM

## 2017-07-02 DIAGNOSIS — Z9011 Acquired absence of right breast and nipple: Secondary | ICD-10-CM

## 2017-07-02 DIAGNOSIS — C50911 Malignant neoplasm of unspecified site of right female breast: Secondary | ICD-10-CM

## 2017-07-02 DIAGNOSIS — Z87891 Personal history of nicotine dependence: Secondary | ICD-10-CM | POA: Diagnosis not present

## 2017-07-02 DIAGNOSIS — Z5112 Encounter for antineoplastic immunotherapy: Secondary | ICD-10-CM | POA: Diagnosis not present

## 2017-07-02 DIAGNOSIS — I10 Essential (primary) hypertension: Secondary | ICD-10-CM

## 2017-07-02 DIAGNOSIS — Z79811 Long term (current) use of aromatase inhibitors: Secondary | ICD-10-CM

## 2017-07-02 DIAGNOSIS — Z800 Family history of malignant neoplasm of digestive organs: Secondary | ICD-10-CM | POA: Diagnosis not present

## 2017-07-02 LAB — COMPREHENSIVE METABOLIC PANEL
ALK PHOS: 81 U/L (ref 38–126)
ALT: 72 U/L — ABNORMAL HIGH (ref 0–44)
ANION GAP: 8 (ref 5–15)
AST: 34 U/L (ref 15–41)
Albumin: 4 g/dL (ref 3.5–5.0)
BUN: 24 mg/dL — ABNORMAL HIGH (ref 8–23)
CALCIUM: 9.5 mg/dL (ref 8.9–10.3)
CO2: 28 mmol/L (ref 22–32)
Chloride: 104 mmol/L (ref 98–111)
Creatinine, Ser: 0.85 mg/dL (ref 0.44–1.00)
GFR calc non Af Amer: >60 mL/min (ref >60)
Glucose, Bld: 86 mg/dL (ref 70–99)
POTASSIUM: 3.8 mmol/L (ref 3.5–5.1)
SODIUM: 140 mmol/L (ref 135–145)
TOTAL PROTEIN: 6.9 g/dL (ref 6.5–8.1)
Total Bilirubin: 0.5 mg/dL (ref 0.3–1.2)

## 2017-07-02 LAB — CBC WITH DIFFERENTIAL/PLATELET
BASOS ABS: 0.1 10*3/uL (ref 0.0–0.1)
BASOS PCT: 1 %
Eosinophils Absolute: 0.1 10*3/uL (ref 0.0–0.5)
Eosinophils Relative: 2 %
HEMATOCRIT: 42.7 % (ref 34.8–46.6)
Hemoglobin: 13.7 g/dL (ref 11.6–15.9)
Lymphocytes Relative: 52 %
Lymphs Abs: 3.3 10*3/uL (ref 0.9–3.3)
MCH: 26.6 pg (ref 25.1–34.0)
MCHC: 32.2 g/dL (ref 31.5–36.0)
MCV: 82.5 fL (ref 79.5–101.0)
Monocytes Absolute: 0.4 10*3/uL (ref 0.1–0.9)
Monocytes Relative: 7 %
NEUTROS ABS: 2.4 10*3/uL (ref 1.5–6.5)
Neutrophils Relative %: 38 %
Platelets: 185 10*3/uL (ref 145–400)
RBC: 5.17 MIL/uL (ref 3.70–5.45)
RDW: 14.3 % (ref 11.2–14.5)
WBC: 6.4 10*3/uL (ref 3.9–10.3)

## 2017-07-02 MED ORDER — ACETAMINOPHEN 325 MG PO TABS
ORAL_TABLET | ORAL | Status: AC
Start: 2017-07-02 — End: ?
  Filled 2017-07-02: qty 2

## 2017-07-02 MED ORDER — ACETAMINOPHEN 325 MG PO TABS
650.0000 mg | ORAL_TABLET | Freq: Once | ORAL | Status: AC
  Administered 2017-07-02: 650 mg via ORAL

## 2017-07-02 MED ORDER — HEPARIN SOD (PORK) LOCK FLUSH 100 UNIT/ML IV SOLN
500.0000 [IU] | Freq: Once | INTRAVENOUS | Status: AC | PRN
  Administered 2017-07-02: 500 [IU]
  Filled 2017-07-02: qty 5

## 2017-07-02 MED ORDER — SODIUM CHLORIDE 0.9% FLUSH
10.0000 mL | INTRAVENOUS | Status: DC | PRN
  Administered 2017-07-02: 10 mL
  Filled 2017-07-02: qty 10

## 2017-07-02 MED ORDER — ALRA NON-METALLIC DEODORANT (RAD-ONC)
1.0000 "application " | Freq: Once | TOPICAL | Status: AC
  Administered 2017-07-02: 1 via TOPICAL

## 2017-07-02 MED ORDER — DIPHENHYDRAMINE HCL 25 MG PO CAPS
25.0000 mg | ORAL_CAPSULE | Freq: Once | ORAL | Status: AC
  Administered 2017-07-02: 25 mg via ORAL

## 2017-07-02 MED ORDER — TRASTUZUMAB CHEMO 150 MG IV SOLR
6.0000 mg/kg | Freq: Once | INTRAVENOUS | Status: AC
  Administered 2017-07-02: 651 mg via INTRAVENOUS
  Filled 2017-07-02: qty 31

## 2017-07-02 MED ORDER — DIPHENHYDRAMINE HCL 25 MG PO CAPS
ORAL_CAPSULE | ORAL | Status: AC
  Filled 2017-07-02: qty 1

## 2017-07-02 MED ORDER — RADIAPLEXRX EX GEL
Freq: Once | CUTANEOUS | Status: AC
  Administered 2017-07-02: 15:00:00 via TOPICAL

## 2017-07-02 MED ORDER — SODIUM CHLORIDE 0.9 % IV SOLN
Freq: Once | INTRAVENOUS | Status: AC
  Administered 2017-07-02: 12:00:00 via INTRAVENOUS

## 2017-07-02 NOTE — Progress Notes (Signed)
[place]  [phone] [phone]     ID: RENATE DANH DOB: 11-05-1934  MR#: [id number]  NOB#:[id number]  Patient Care Team: [name], MD as PCP - General (Family Medicine) Magrinat, Virgie Dad, MD as Consulting Physician (Oncology) Erroll Luna, MD as Consulting Physician (General Surgery) Kyung Rudd, MD as Consulting Physician (Radiation Oncology) Vickey Huger, MD as Consulting Physician (Orthopedic Surgery) Bo Merino, MD as Consulting Physician (Rheumatology) Lorretta Harp, MD as Consulting Physician (Cardiology) OTHER MD:   CHIEF COMPLAINT: Triple positive breast cancer  CURRENT TREATMENT: Anastrozole, trastuzumab   HISTORY OF CURRENT ILLNESS: From the original intake note:  [name] had mammography at the breast center October 11, 2011 showing calcifications in the upper right breast.  Six-month follow-up was suggested, but  the patient did not follow-up.  More recently she noticed nipple inversion on the right breast and followed this without reporting it over the last several months. She did undergo bilateral diagnostic mammography with tomography and right breast ultrasonography at [facility] on 03/26/2017 showing: breast density category B. There is a highly suspicious right retroareolar mass  measuring approximately 2.3 x 1.5 x 2.8 cm  with associated calcifications, together the mass and calcifications measure up to 3.3 cm mammographically. There are 2 lymph nodes in the right axilla with borderline thickened cortices, 1 of which measures 1 x 0.9 x 0.6 cm with a cortex thickness of 0.4 mm.  Accordingly on 03/29/2017 she proceeded to biopsy of the right breast and one axillary lymph node.. The pathology from this procedure showed ([id number]): Invasive lobular carcinoma,grade II, E-cadherin negative. The right axillary lymph node was negative for carcinoma. Prognostic indicators significant for: estrogen  receptor, 100% positive and progesterone receptor, 100% positive, both with strong staining intensity. Proliferation marker Ki67 at 25%. HER2 amplified with ratios  HER2/CEP17 signals 2.22 and average HER2 copies per cell 3.00  The patient's subsequent history is as detailed below.  INTERVAL HISTORY: Saryna returns today for follow up and treatment of her triple positive breast cancer accompanied by Nabria's husband, Broadus John. Jere continues on Anastrozole daily.  She has some hot flashes, which are tolerable.  She denies any other side effects such as increased joint aches, or vaginal dryness.    She also receives trastuzumab given every 21 days. She is tolerating this well too.    REVIEW OF SYSTEMS: Makiyla denies any issues today.  A detailed ROS was conducted and is non contributory.  However, I did find a post it note on my desk about a ? axilllary nodule.  She says she noted this after her surgery.  She says she talked to Dr. Brantley Stage about it.  She notes it is still there and she sometimes has a hard time finding it.     PAST MEDICAL HISTORY: Past Medical History:  Diagnosis Date  . A-fib ([place])   . Arthritis   . 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 DDD (degenerative disc disease), lumbar 12/08/2015   S/P discectomy   . Diaphragmatic hernia 12/08/2015  . Dysrhythmia    A-Fib  . Edema of lower extremity 06/29/09   Lower Venous Exam- normal. No evidence of thrombus or thrombophlebitis.  . Esophagitis 12/08/2015  . Frequent urination at night   . GERD (gastroesophageal reflux disease)   . Hemorrhoids   . History of calcium pyrophosphate deposition disease (CPPD) 12/08/2015  . History of miscarriage 12/08/2015   x3  . History of total right knee replacement 12/08/2015  .  Hypertension 02/24/09   Echo-EF 62%; Myocardial Perfusion Study-Normal. No signifcant ischemia noted.  . IBS (irritable bowel syndrome)   . Migraine   . Osteoarthritis of both feet 12/08/2015  . Osteoarthritis  of both hands 12/08/2015  . PONV (postoperative nausea and vomiting)   . Uterine fibroid 12/08/2015    PAST SURGICAL HISTORY: Past Surgical History:  Procedure Laterality Date  . ABDOMINAL HYSTERECTOMY  1974  . APPENDECTOMY    . BACK SURGERY  2001  . BUNIONECTOMY  1986  . CESAREAN SECTION  1971  . COLONOSCOPY    . COLONOSCOPY WITH PROPOFOL N/A 05/28/2014   Procedure: COLONOSCOPY WITH PROPOFOL;  Surgeon: Carol Ada, MD;  Location: [facility];  Service: Endoscopy;  Laterality: N/A;  . ESOPHAGOGASTRODUODENOSCOPY (EGD) WITH PROPOFOL N/A 05/28/2014   Procedure: ESOPHAGOGASTRODUODENOSCOPY (EGD) WITH PROPOFOL;  Surgeon: Carol Ada, MD;  Location: [facility];  Service: Endoscopy;  Laterality: N/A;  . EXPLORATORY LAPAROTOMY     open procedure  . EYE SURGERY Bilateral    cataract removal  . PORTACATH PLACEMENT Right 05/07/2017   Procedure: INSERTION PORT-A-CATH;  Surgeon: Erroll Luna, MD;  Location: [facility];  Service: General;  Laterality: Right;  . SHOULDER SURGERY Right   . SIMPLE MASTECTOMY WITH AXILLARY SENTINEL NODE BIOPSY Right 05/07/2017   Procedure: RIGHT SIMPLE MASTECTOMY WITH AXILLARY SENTINEL NODE BIOPSY;  Surgeon: Erroll Luna, MD;  Location: [facility];  Service: General;  Laterality: Right;  . TOTAL KNEE ARTHROPLASTY Right 01/26/2013   DR Ronnie Derby  . TOTAL KNEE ARTHROPLASTY Right 01/26/2013   Procedure: TOTAL KNEE ARTHROPLASTY;  Surgeon: Vickey Huger, MD;  Location: [facility];  Service: Orthopedics;  Laterality: Right;  . UTERINE FIBROID SURGERY  1967    FAMILY HISTORY Family History  Problem Relation Age of Onset  . Other Mother   . Varicose Veins Mother   . Emphysema Mother   . Heart disease Father        heart attack  . Diabetes Sister   . Other Sister        pacemaker  . Diabetes Sister   . Heart disease Sister   The patient's father died in 58 (around age 59) in [place] due to sudden death. The patient's mother died around age 52 due to emphysema. The patient was  born in [place], [place]. The patient has 2 brothers and 3 sisters. One sister had a lumpectomy in 1991, but this was not malignant. Another sister died of pancreatic cancer. The patient denies a family history of breast or ovarian cancer.    GYNECOLOGIC HISTORY:  No LMP recorded. Patient has had a hysterectomy. Menarche: 83 years old Age at first live birth: 83 years old She is GXP1.  Her LMP was at age 59 when she underwent hysterectomy, without salpingo-oophorectomy. She was on Premarin for more than 20 years.  She never used oral contraceptives.   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 Her husband, Broadus John, used to be in banking, and he is currently retired. The patient's biological daughter, Clarene Critchley, is a Health and safety inspector at a contact center in [place]. The patient's adopted daughter, Lenna Sciara, is an Social research officer, government in [place]. The patient's adopted son, Gaspar Bidding, is unemployed in [place]. The patient has 8 grandchildren, one of whom belongs to [place] and works as a Pharmacist, community.  The patient belongs to [place].      ADVANCED DIRECTIVES:    HEALTH MAINTENANCE: Social History  Tobacco Use  . Smoking status: Former Smoker    Packs/day: 1.00    Years: 20.00    Pack years: 20.00    Types: Cigarettes    Last attempt to quit: 02/19/1990    Years since quitting: 27.3  . Smokeless tobacco: Never Used  . Tobacco comment: ' i QUIT SMOKING MANY YEARS AGO "  Substance Use Topics  . Alcohol use: No  . Drug use: Never     Colonoscopy: Dr. Benson Norway 2016  PAP:  Bone density:   Allergies  Allergen Reactions  . Oxycodone Itching, Nausea And Vomiting and Other (See Comments)    Full body weakness   . Hydrocodone Other (See Comments)    Reports they make her feel sick/drowsy  . Percocet [Oxycodone-Acetaminophen] Itching    Pt says she can take tylenol   . Soma [Carisoprodol] Nausea And Vomiting    . Ultram [Tramadol Hcl] Nausea And Vomiting    Current Outpatient Medications  Medication Sig Dispense Refill  . acetaminophen (TYLENOL) 650 MG CR tablet Take 650 mg by mouth every 8 (eight) hours as needed for pain.    Marland Kitchen anastrozole (ARIMIDEX) 1 MG tablet Take 1 tablet (1 mg total) by mouth daily. 90 tablet 4  . Ascorbic Acid (VITAMIN C WITH ROSE HIPS) 500 MG tablet Take 500 mg by mouth daily.    Marland Kitchen aspirin-sod bicarb-citric acid (ALKA-SELTZER) 325 MG TBEF tablet Take 325 mg by mouth every 6 (six) hours as needed.    Marland Kitchen atorvastatin (LIPITOR) 20 MG tablet Take 20 mg by mouth at bedtime.     . benzonatate (TESSALON) 200 MG capsule Take 200 mg by mouth 3 (three) times daily as needed for cough.    . Calcium Carbonate-Vit D-Min (CALCIUM 600+D3 PLUS MINERALS) 600-800 MG-UNIT TABS Take 1 tablet by mouth at bedtime.    . Cholecalciferol (VITAMIN D) 2000 units CAPS Take 2,000 Units by mouth daily.    . colchicine 0.6 MG tablet TAKE 1 TABLET DAILY BY  MOUTH. 90 tablet 1  . docusate sodium (COLACE) 250 MG capsule Take 250 mg by mouth daily as needed for constipation.    . furosemide (LASIX) 20 MG tablet Take 20 mg by mouth daily.      Marland Kitchen gabapentin (NEURONTIN) 300 MG capsule Take 300 mg by mouth at bedtime.     Marland Kitchen HYDROcodone-acetaminophen (NORCO/VICODIN) 5-325 MG tablet Take 1 tablet by mouth every 6 (six) hours as needed for moderate pain. 20 tablet 0  . hydrocortisone (ANUSOL-HC) 2.5 % rectal cream Place 1 application rectally 2 (two) times daily as needed for hemorrhoids or itching.    . hydroxychloroquine (PLAQUENIL) 200 MG tablet TAKE 1 TABLET BY MOUTH TWO  TIMES DAILY (Patient taking differently: TAKE 1 TABLET BY MOUTH ONE TIME DAILY) 180 tablet 0  . lidocaine-prilocaine (EMLA) cream Apply 1 application topically as needed. 30 g 0  . losartan-hydrochlorothiazide (HYZAAR) 100-25 MG per tablet Take 1 tablet by mouth daily. IN THE MORNING.    . magnesium oxide (MAG-OX) 400 MG tablet Take 400 mg by  mouth daily.     . metoprolol tartrate (LOPRESSOR) 25 MG tablet Take 25 mg by mouth daily with lunch.     . Multiple Vitamin (MULTIVITAMIN) tablet Take 1 tablet by mouth daily. CENTRUM    . NONFORMULARY OR COMPOUNDED ITEM Apply 1-2 g topically 4 (four) times daily as needed for pain. BACLOFEN-DOXEPIN-GABAPENTIN-TOPIRAMATE-PENTOXIFYLLINE  2  . omeprazole (PRILOSEC) 40 MG capsule Take 40 mg by mouth daily as  needed (FOR ACID REFLUX/INDIGESTION).   12  . potassium chloride SA (K-DUR,KLOR-CON) 20 MEQ tablet Take 10 mEq by mouth daily.    . Trastuzumab (HERCEPTIN IV) Inject into the vein every 21 ( twenty-one) days.    Marland Kitchen triamcinolone cream (KENALOG) 0.1 % Apply 1 application topically 2 (two) times daily as needed (Vaginal Itching).    . warfarin (COUMADIN) 5 MG tablet Take 0.5-1 tablets (2.5-5 mg total) by mouth daily. Take 5 mg tablet daily or as directed by pharmacist from [facility] (Patient taking differently: Take 5 mg by mouth at bedtime. Taking according to doctors instructions after each lab check dosages vary.) 40 tablet 1  . OVER THE COUNTER MEDICATION Take 600 mcg by mouth daily as needed (AT ONSET OF MIGRAINE). MYGRAFEW (TANACETUM PARTHENIUM)     No current facility-administered medications for this visit.     OBJECTIVE:   Vitals:   07/02/17 1043  BP: (!) 144/68  Pulse: 63  Resp: 18  Temp: 98.3 F (36.8 C)  SpO2: 97%     Body mass index is 37.28 kg/m.   Wt Readings from Last 3 Encounters:  07/02/17 238 lb (108 kg)  06/19/17 236 lb 12.8 oz (107.4 kg)  06/19/17 236 lb 12.8 oz (107.4 kg)      ECOG FS:1 - Symptomatic but completely ambulatory GENERAL: Patient is a well appearing female in no acute distress HEENT:  Sclerae anicteric.  Oropharynx clear and moist. No ulcerations or evidence of oropharyngeal candidiasis. Neck is supple.  NODES:  No cervical, supraclavicular, or axillary lymphadenopathy palpated. There is a small axillary nodule in the lower portion of the  axilla, it is about 0.5cm in size. BREAST EXAM:  Deferred. LUNGS:  Clear to auscultation bilaterally.  No wheezes or rhonchi. HEART:  Regular rate and rhythm. No murmur appreciated. ABDOMEN:  Soft, nontender.  Positive, normoactive bowel sounds. No organomegaly palpated. MSK:  No focal spinal tenderness to palpation. Full range of motion bilaterally in the upper extremities. EXTREMITIES:  No peripheral edema.   SKIN:  Clear with no obvious rashes or skin changes. No nail dyscrasia. NEURO:  Nonfocal. Well oriented.  Appropriate affect.    LAB RESULTS:  CMP     Component Value Date/Time   NA 141 06/11/2017 0818   NA 142 09/22/2015   K 3.8 06/11/2017 0818   CL 104 06/11/2017 0818   CO2 27 06/11/2017 0818   GLUCOSE 103 06/11/2017 0818   BUN 16 06/11/2017 0818   BUN 19 09/22/2015   CREATININE 0.92 06/11/2017 0818   CREATININE 0.91 (H) 10/11/2016 1136   CALCIUM 9.8 06/11/2017 0818   PROT 7.1 06/11/2017 0818   ALBUMIN 4.2 06/11/2017 0818   AST 34 06/11/2017 0818   ALT 47 06/11/2017 0818   ALKPHOS 79 06/11/2017 0818   BILITOT 0.4 06/11/2017 0818   GFRNONAA 57 (L) 06/11/2017 0818   GFRNONAA 60 10/11/2016 1136   GFRAA >60 06/11/2017 0818   GFRAA 69 10/11/2016 1136    No results found for: TOTALPROTELP, ALBUMINELP, A1GS, A2GS, BETS, BETA2SER, GAMS, MSPIKE, SPEI  No results found for: KPAFRELGTCHN, LAMBDASER, KAPLAMBRATIO  Lab Results  Component Value Date   WBC 6.4 07/02/2017   NEUTROABS 2.4 07/02/2017   HGB 13.7 07/02/2017   HCT 42.7 07/02/2017   MCV 82.5 07/02/2017   PLT 185 07/02/2017    _0 @  No results found for: LABCA2  No components found for: SEGBTD176  No results for input(s): INR in the last 168 hours.  No results found for: LABCA2  No results found for: ELF810  No results found for: FBP102  No results found for: HEN277  No results found for: CA2729  No components found for: HGQUANT  No results found for: CEA1 / No results found for:  CEA1   No results found for: AFPTUMOR  No results found for: CHROMOGRNA  No results found for: PSA1  Appointment on 07/02/2017  Component Date Value Ref Range Status  . WBC 07/02/2017 6.4  3.9 - 10.3 K/uL Final  . RBC 07/02/2017 5.17  3.70 - 5.45 MIL/uL Final  . Hemoglobin 07/02/2017 13.7  11.6 - 15.9 g/dL Final  . HCT 07/02/2017 42.7  34.8 - 46.6 % Final  . MCV 07/02/2017 82.5  79.5 - 101.0 fL Final  . MCH 07/02/2017 26.6  25.1 - 34.0 pg Final  . MCHC 07/02/2017 32.2  31.5 - 36.0 g/dL Final  . RDW 07/02/2017 14.3  11.2 - 14.5 % Final  . Platelets 07/02/2017 185  145 - 400 K/uL Final  . Neutrophils Relative % 07/02/2017 38  % Final  . Neutro Abs 07/02/2017 2.4  1.5 - 6.5 K/uL Final  . Lymphocytes Relative 07/02/2017 52  % Final  . Lymphs Abs 07/02/2017 3.3  0.9 - 3.3 K/uL Final  . Monocytes Relative 07/02/2017 7  % Final  . Monocytes Absolute 07/02/2017 0.4  0.1 - 0.9 K/uL Final  . Eosinophils Relative 07/02/2017 2  % Final  . Eosinophils Absolute 07/02/2017 0.1  0.0 - 0.5 K/uL Final  . Basophils Relative 07/02/2017 1  % Final  . Basophils Absolute 07/02/2017 0.1  0.0 - 0.1 K/uL Final   Performed at [facility], [place] [address], [place], [place] [address]    (this displays the last labs from the last 3 days)  No results found for: TOTALPROTELP, ALBUMINELP, A1GS, A2GS, BETS, BETA2SER, GAMS, MSPIKE, SPEI (this displays SPEP labs)  No results found for: KPAFRELGTCHN, LAMBDASER, KAPLAMBRATIO (kappa/lambda light chains)  No results found for: HGBA, HGBA2QUANT, HGBFQUANT, HGBSQUAN (Hemoglobinopathy evaluation)   Lab Results  Component Value Date   LDH 162 10/22/2006    No results found for: IRON, TIBC, IRONPCTSAT (Iron and TIBC)  No results found for: FERRITIN  Urinalysis    Component Value Date/Time   COLORURINE YELLOW 09/27/2015 [place] 09/27/2015 1532   LABSPEC 1.015 09/27/2015 1532   PHURINE 6.5 09/27/2015 1532    GLUCOSEU NEGATIVE 09/27/2015 1532   HGBUR TRACE (A) 09/27/2015 1532   BILIRUBINUR NEGATIVE 09/27/2015 1532   KETONESUR NEGATIVE 09/27/2015 1532   PROTEINUR NEGATIVE 09/27/2015 1532   UROBILINOGEN 0.2 01/19/2013 0954   NITRITE NEGATIVE 09/27/2015 1532   LEUKOCYTESUR NEGATIVE 09/27/2015 1532     STUDIES: No results found.  ELIGIBLE FOR AVAILABLE RESEARCH PROTOCOL: Exact sciences study  ASSESSMENT: 83 y.o. [place] woman status post central right breast biopsy 03/29/2017 for a clinical T1c N0, stage IA invasive lobular carcinoma, grade 2, strongly estrogen and progesterone receptor positive, also HER-2 amplified, with an MIB-1 of 25%  (1) status post right mastectomy with sentinel lymph node sampling 05/07/2017 for a pT3 pN1, stage IIB invasive lobular carcinoma, grade 3, with negative margins.  (2) consider adjuvant radiation given size of tumor and positive nodes  (3) trastuzumab adjuvantly for 1 year to start 06/04/2017  (a) echocardiogram 04/16/2017 shows an ejection fraction of 60-65%.  (4) anastrozole started 05/28/2017  (a) bone density  (5) small seroma right axilla, requiring follow-up  PLAN:  Keyara is doing well today.  I reviewed her normal CBC with her today (CMET pending).  She will continue on Anastrozole daily. I counseled her on non pharmacologic interventions for hot flashes.    Hadleigh will continue to receive Trastuzumab every 3 weeks, she is tolerating this well.  She has f/u with Dr.Mclean in August, and she will see Dr. Jana Hakim later that month.    Due to the small nodule in the axilla, I have ordered an ultrasound to better evaluate per Dr. Jana Hakim.    I did request the addition of a flush appointment to each one of her lab visits.  She knows to call for any issues that may develop before the next visit.  A total of (30) minutes of face-to-face time was spent with this patient with greater than 50% of that time in counseling and  care-coordination.   Wilber Bihari, NP  07/02/17 11:00 AM Medical Oncology and Hematology [facility] [address] [place], [place] [address] Tel. [phone]    Fax. [phone]

## 2017-07-02 NOTE — Telephone Encounter (Signed)
No new orders. Patient already has port flush appointment with all other future lab appointments as requested per 6/25 los.

## 2017-07-02 NOTE — Patient Instructions (Signed)
[facility] Discharge Instructions for Patients Receiving Chemotherapy  Today you received the following chemotherapy agents Herceptin  To help prevent nausea and vomiting after your treatment, we encourage you to take your nausea medication as prescribed by MD.    If you develop nausea and vomiting that is not controlled by your nausea medication, call the clinic.   BELOW ARE SYMPTOMS THAT SHOULD BE REPORTED IMMEDIATELY:  *FEVER GREATER THAN 100.5 F  *CHILLS WITH OR WITHOUT FEVER  NAUSEA AND VOMITING THAT IS NOT CONTROLLED WITH YOUR NAUSEA MEDICATION  *UNUSUAL SHORTNESS OF BREATH  *UNUSUAL BRUISING OR BLEEDING  TENDERNESS IN MOUTH AND THROAT WITH OR WITHOUT PRESENCE OF ULCERS  *URINARY PROBLEMS  *BOWEL PROBLEMS  UNUSUAL RASH Items with * indicate a potential emergency and should be followed up as soon as possible.  Feel free to call the clinic should you have any questions or concerns. The clinic phone number is [phone].  Please show the CHEMO ALERT CARD at check-in to the Emergency Department and triage nurse.   

## 2017-07-02 NOTE — Telephone Encounter (Signed)
After several attempts calling patient, LVM to inform of appt at [facility] on 07/10/17 at 1:20 pm.  [place] number included in VM of pt has questions.

## 2017-07-02 NOTE — Telephone Encounter (Signed)
Gave avs and calendar had to move 7/16 to 7/17 due to tx area

## 2017-07-02 NOTE — Progress Notes (Signed)
Pt here for patient teaching.  Pt given Radiation and You booklet, skin care instructions, Alra deodorant and Radiaplex gel.  Reviewed areas of pertinence such as fatigue, hair loss, skin changes, breast tenderness and breast swelling . Pt able to give teach back of to pat skin and use unscented/gentle soap,apply Radiaplex bid and avoid applying anything to skin within 4 hours of treatment. Pt verbalizes understanding of information given and will contact nursing with any questions or concerns.     Beula Joyner M. Vernice Bowker RN, BSN      

## 2017-07-03 ENCOUNTER — Ambulatory Visit
Admission: RE | Admit: 2017-07-03 | Discharge: 2017-07-03 | Disposition: A | Discharge status: Home / Self Care | Payer: Medicare Other | Source: Ambulatory Visit | Attending: Radiation Oncology | Admitting: Radiation Oncology

## 2017-07-03 DIAGNOSIS — Z510 Encounter for antineoplastic radiation therapy: Secondary | ICD-10-CM | POA: Diagnosis not present

## 2017-07-04 ENCOUNTER — Ambulatory Visit
Admission: RE | Admit: 2017-07-04 | Discharge: 2017-07-04 | Disposition: A | Discharge status: Home / Self Care | Payer: Medicare Other | Source: Ambulatory Visit | Attending: Radiation Oncology | Admitting: Radiation Oncology

## 2017-07-04 DIAGNOSIS — Z510 Encounter for antineoplastic radiation therapy: Secondary | ICD-10-CM | POA: Diagnosis not present

## 2017-07-05 ENCOUNTER — Ambulatory Visit
Admission: RE | Admit: 2017-07-05 | Discharge: 2017-07-05 | Disposition: A | Discharge status: Home / Self Care | Payer: Medicare Other | Source: Ambulatory Visit | Attending: Radiation Oncology | Admitting: Radiation Oncology

## 2017-07-05 DIAGNOSIS — Z510 Encounter for antineoplastic radiation therapy: Secondary | ICD-10-CM | POA: Diagnosis not present

## 2017-07-08 ENCOUNTER — Ambulatory Visit
Admission: RE | Admit: 2017-07-08 | Discharge: 2017-07-08 | Disposition: A | Discharge status: Home / Self Care | Payer: Medicare Other | Source: Ambulatory Visit | Attending: Radiation Oncology | Admitting: Radiation Oncology

## 2017-07-08 DIAGNOSIS — Z510 Encounter for antineoplastic radiation therapy: Secondary | ICD-10-CM | POA: Insufficient documentation

## 2017-07-08 DIAGNOSIS — Z170 Estrogen receptor positive status [ER+]: Secondary | ICD-10-CM | POA: Diagnosis not present

## 2017-07-08 DIAGNOSIS — C50111 Malignant neoplasm of central portion of right female breast: Secondary | ICD-10-CM | POA: Insufficient documentation

## 2017-07-09 ENCOUNTER — Ambulatory Visit
Admission: RE | Admit: 2017-07-09 | Discharge: 2017-07-09 | Disposition: A | Discharge status: Home / Self Care | Payer: Medicare Other | Source: Ambulatory Visit | Attending: Radiation Oncology | Admitting: Radiation Oncology

## 2017-07-09 DIAGNOSIS — C50111 Malignant neoplasm of central portion of right female breast: Secondary | ICD-10-CM | POA: Diagnosis not present

## 2017-07-10 ENCOUNTER — Ambulatory Visit
Admission: RE | Admit: 2017-07-10 | Discharge: 2017-07-10 | Disposition: A | Discharge status: Home / Self Care | Payer: Medicare Other | Source: Ambulatory Visit | Attending: Radiation Oncology | Admitting: Radiation Oncology

## 2017-07-10 ENCOUNTER — Other Ambulatory Visit: Payer: Medicare Other

## 2017-07-10 DIAGNOSIS — C50111 Malignant neoplasm of central portion of right female breast: Secondary | ICD-10-CM | POA: Diagnosis not present

## 2017-07-12 ENCOUNTER — Ambulatory Visit: Payer: Medicare Other

## 2017-07-15 ENCOUNTER — Ambulatory Visit
Admission: RE | Admit: 2017-07-15 | Discharge: 2017-07-15 | Disposition: A | Discharge status: Home / Self Care | Payer: Medicare Other | Source: Ambulatory Visit | Attending: Radiation Oncology | Admitting: Radiation Oncology

## 2017-07-15 DIAGNOSIS — C50111 Malignant neoplasm of central portion of right female breast: Secondary | ICD-10-CM | POA: Diagnosis not present

## 2017-07-16 ENCOUNTER — Ambulatory Visit
Admission: RE | Admit: 2017-07-16 | Discharge: 2017-07-16 | Disposition: A | Discharge status: Home / Self Care | Payer: Medicare Other | Source: Ambulatory Visit | Attending: Adult Health | Admitting: Adult Health

## 2017-07-16 ENCOUNTER — Ambulatory Visit
Admission: RE | Admit: 2017-07-16 | Discharge: 2017-07-16 | Disposition: A | Discharge status: Home / Self Care | Payer: Medicare Other | Source: Ambulatory Visit | Attending: Radiation Oncology | Admitting: Radiation Oncology

## 2017-07-16 ENCOUNTER — Other Ambulatory Visit: Payer: Self-pay | Admitting: Adult Health

## 2017-07-16 DIAGNOSIS — R2231 Localized swelling, mass and lump, right upper limb: Secondary | ICD-10-CM

## 2017-07-16 DIAGNOSIS — C50911 Malignant neoplasm of unspecified site of right female breast: Secondary | ICD-10-CM

## 2017-07-16 DIAGNOSIS — C50111 Malignant neoplasm of central portion of right female breast: Secondary | ICD-10-CM | POA: Diagnosis not present

## 2017-07-16 MED ORDER — RADIAPLEXRX EX GEL
Freq: Once | CUTANEOUS | Status: AC
  Administered 2017-07-16: 12:00:00 via TOPICAL

## 2017-07-17 ENCOUNTER — Ambulatory Visit
Admission: RE | Admit: 2017-07-17 | Discharge: 2017-07-17 | Disposition: A | Discharge status: Home / Self Care | Payer: Medicare Other | Source: Ambulatory Visit | Attending: Radiation Oncology | Admitting: Radiation Oncology

## 2017-07-17 DIAGNOSIS — C50111 Malignant neoplasm of central portion of right female breast: Secondary | ICD-10-CM | POA: Diagnosis not present

## 2017-07-18 ENCOUNTER — Ambulatory Visit
Admission: RE | Admit: 2017-07-18 | Discharge: 2017-07-18 | Disposition: A | Discharge status: Home / Self Care | Payer: Medicare Other | Source: Ambulatory Visit | Attending: Radiation Oncology | Admitting: Radiation Oncology

## 2017-07-18 ENCOUNTER — Encounter: Payer: Self-pay | Admitting: Physical Therapy

## 2017-07-18 ENCOUNTER — Other Ambulatory Visit: Payer: Self-pay

## 2017-07-18 ENCOUNTER — Ambulatory Visit: Payer: Medicare Other | Attending: Surgery | Admitting: Physical Therapy

## 2017-07-18 DIAGNOSIS — Z483 Aftercare following surgery for neoplasm: Secondary | ICD-10-CM | POA: Insufficient documentation

## 2017-07-18 DIAGNOSIS — M25611 Stiffness of right shoulder, not elsewhere classified: Secondary | ICD-10-CM | POA: Diagnosis present

## 2017-07-18 DIAGNOSIS — C50111 Malignant neoplasm of central portion of right female breast: Secondary | ICD-10-CM | POA: Diagnosis present

## 2017-07-18 DIAGNOSIS — R293 Abnormal posture: Secondary | ICD-10-CM | POA: Diagnosis not present

## 2017-07-18 DIAGNOSIS — Z170 Estrogen receptor positive status [ER+]: Secondary | ICD-10-CM | POA: Insufficient documentation

## 2017-07-18 NOTE — Therapy (Signed)
[place], [place], [address] Phone: [phone]   Fax:  [phone]  Physical Therapy Treatment  Patient Details  Name: Tara Cox MRN: [id number] Date of Birth: [birth year] Referring Provider: Dr. Erroll Luna   Encounter Date: 07/18/2017  PT End of Session - 07/18/17 1239    Visit Number  7    Number of Visits  10    PT Start Time  0845    PT Stop Time  0932    PT Time Calculation (min)  47 min    Activity Tolerance  Patient tolerated treatment well    Behavior During Therapy  [facility] for tasks assessed/performed       Past Medical History:  Diagnosis Date  . A-fib ([place])   . Arthritis   . 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 DDD (degenerative disc disease), lumbar 12/08/2015   S/P discectomy   . Diaphragmatic hernia 12/08/2015  . Dysrhythmia    A-Fib  . Edema of lower extremity 06/29/09   Lower Venous Exam- normal. No evidence of thrombus or thrombophlebitis.  . Esophagitis 12/08/2015  . Frequent urination at night   . GERD (gastroesophageal reflux disease)   . Hemorrhoids   . History of calcium pyrophosphate deposition disease (CPPD) 12/08/2015  . History of miscarriage 12/08/2015   x3  . History of total right knee replacement 12/08/2015  . Hypertension 02/24/09   Echo-EF 62%; Myocardial Perfusion Study-Normal. No signifcant ischemia noted.  . IBS (irritable bowel syndrome)   . Migraine   . Osteoarthritis of both feet 12/08/2015  . Osteoarthritis of both hands 12/08/2015  . PONV (postoperative nausea and vomiting)   . Uterine fibroid 12/08/2015    Past Surgical History:  Procedure Laterality Date  . ABDOMINAL HYSTERECTOMY  1974  . APPENDECTOMY    . BACK SURGERY  2001  . BUNIONECTOMY  1986  . CESAREAN SECTION  1971  . COLONOSCOPY    . COLONOSCOPY WITH PROPOFOL N/A 05/28/2014   Procedure: COLONOSCOPY WITH PROPOFOL;  Surgeon: Carol Ada, MD;  Location: [facility];   Service: Endoscopy;  Laterality: N/A;  . ESOPHAGOGASTRODUODENOSCOPY (EGD) WITH PROPOFOL N/A 05/28/2014   Procedure: ESOPHAGOGASTRODUODENOSCOPY (EGD) WITH PROPOFOL;  Surgeon: Carol Ada, MD;  Location: [facility];  Service: Endoscopy;  Laterality: N/A;  . EXPLORATORY LAPAROTOMY     open procedure  . EYE SURGERY Bilateral    cataract removal  . PORTACATH PLACEMENT Right 05/07/2017   Procedure: INSERTION PORT-A-CATH;  Surgeon: Erroll Luna, MD;  Location: [place];  Service: General;  Laterality: Right;  . SHOULDER SURGERY Right   . SIMPLE MASTECTOMY WITH AXILLARY SENTINEL NODE BIOPSY Right 05/07/2017   Procedure: RIGHT SIMPLE MASTECTOMY WITH AXILLARY SENTINEL NODE BIOPSY;  Surgeon: Erroll Luna, MD;  Location: [place];  Service: General;  Laterality: Right;  . TOTAL KNEE ARTHROPLASTY Right 01/26/2013   DR Ronnie Derby  . TOTAL KNEE ARTHROPLASTY Right 01/26/2013   Procedure: TOTAL KNEE ARTHROPLASTY;  Surgeon: Vickey Huger, MD;  Location: [place];  Service: Orthopedics;  Laterality: Right;  . UTERINE FIBROID SURGERY  1967    There were no vitals filed for this visit.  Subjective Assessment - 07/18/17 0900    Subjective  Patient very concerned about her insurance as she got a bill for $40 from [place]. She has met her out of pocket max and was told that she would not have to pay anything. Because of that, she is unsure she wants to continue PT.    Patient is  accompained by:  Family member    Pertinent History  Right mastectomy and sentinel node biopsy (2/4 positive nodes) on 05/07/17. Port placed for Herceptin. Right rotator cuff repair 3/18. Lumbar discectomy 2001 and right TKR 2014.    Patient Stated Goals  See if my arm is ok    Currently in Pain?  No/denies         [facility] PT Assessment - 07/18/17 0001      AROM   Right Shoulder Extension  48 Degrees    Right Shoulder Flexion  138 Degrees    Right Shoulder ABduction  138 Degrees    Right Shoulder Internal Rotation  50 Degrees    Right Shoulder  External Rotation  73 Degrees                   [facility] Adult PT Treatment/Exercise - 07/18/17 0001      Manual Therapy   Manual Therapy  Neural Stretch    Soft tissue mobilization  around right mastectomy incision, in all directions with light pressure as pt is 10 days into radiation.    Myofascial Release  Not performed today due to skin changes with radiation    Passive ROM  in supine to right shoulder with stretch to tolerance into er, abduction, flexion, and position for radiation    Neural Stretch  Right UE neural stretch to pt tolerance supine                  PT Long Term Goals - 07/18/17 1243      PT LONG TERM GOAL #1   Title  Patient will demonstrate she has returned to baseline related to shoulder ROM and function post operatively.    Time  4    Period  Weeks    Status  Partially Met      PT LONG TERM GOAL #2   Title  Patient will improve DASH score to be </= 10 for improved shoulder function.    Time  4    Period  Weeks    Status  Not Met      PT LONG TERM GOAL #3   Title  Increase left shoulder flexion to >/= 145 degrees for increased ease reaching overhead.    Baseline  Currently has 138 degrees of flexion    Time  4    Period  Weeks    Status  Not Met      PT LONG TERM GOAL #4   Title  Increase left shoulder abduction to >/= 135 degrees for increased ease reaching overhead.    Time  4    Period  Weeks    Status  Achieved      PT LONG TERM GOAL #5   Title  Patient will verbalize good understanding of lymphedema risk reduction practices.    Time  4    Period  Weeks    Status  Achieved         Plan - 07/18/17 0926    Clinical Impression Statement  Patient had many concerns today about her insurance coverage. We contacted her company and learned that she has not yet met her out of pocket max for the year and she will likely be responsible for a $40 copay at each visit. She has decided to stop therapy for now and possibly resume after  completion of radiation. Her shoulder ROM continues to be tight and limited but she feels it has improved some. Her mastectomy scar  would benefit from further PT as well but that would need to be performed after she heals after completion of radiation.    PT Treatment/Interventions  ADLs/Self Care Home Management;Therapeutic exercise;Patient/family education;Manual techniques;Passive range of motion;Therapeutic activities    PT Next Visit Plan  Discharge pt per her request.    Consulted and Agree with Plan of Care  Patient       Patient will benefit from skilled therapeutic intervention in order to improve the following deficits and impairments:  Pain, Decreased range of motion, Decreased knowledge of precautions, Impaired UE functional use, Postural dysfunction  Visit Diagnosis: Abnormal posture  Stiffness of right shoulder, not elsewhere classified  Aftercare following surgery for neoplasm  Malignant neoplasm of central portion of right breast in female, estrogen receptor positive ([place])     Problem List Patient Active Problem List   Diagnosis Date Noted  . Breast cancer, stage 2, right ([place]) 05/07/2017  . Malignant neoplasm of central portion of right breast in female, estrogen receptor positive ([place]) 04/02/2017  . Primary insomnia 09/27/2016  . ANA positive 09/27/2016  . Complete tear of right rotator cuff 05/11/2016  . High risk medication use 05/07/2016  . Uterine fibroid 12/08/2015  . History of miscarriage 12/08/2015  . Esophagitis 12/08/2015  . Diaphragmatic hernia 12/08/2015  . Calcium pyrophosphate deposition disease 12/08/2015  . Osteoarthritis of both hands 12/08/2015  . Osteoarthritis of both feet 12/08/2015  . History of total right knee replacement 12/08/2015  . Chondromalacia of both patellae 12/08/2015  . DDD (degenerative disc disease), lumbar 12/08/2015  . Essential hypertension 06/26/2013  . Paroxysmal atrial fibrillation ([place]) 06/26/2013  .  Hyperlipidemia 06/26/2013  . DJD (degenerative joint disease) of knee 01/26/2013  . Hemorrhoids 07/11/2010  . IBS (irritable bowel syndrome) 07/11/2010  . Fatigue/loss of sleep 07/11/2010  . Night sweats 07/11/2010  . Chills 07/11/2010  . Weight gain 07/11/2010  . Inflammatory arthritis 07/11/2010  . Incontinence of urine 07/11/2010  . Thrombosed external hemorrhoids 07/11/2010   PHYSICAL THERAPY DISCHARGE SUMMARY  Visits from Start of Care: 7  Current functional level related to goals / functional outcomes: See above for goal assessment. She continues to be limited with shoulder flexion and abduction and scar tissue is tight and limiting ROM.   Remaining deficits: Shoulder ROM   Education / Equipment: Lymphedema risk reduction and HEP Plan: Patient agrees to discharge.  Patient goals were partially met. Patient is being discharged due to the patient's request.  ?????         Annia Friendly, [place] 07/18/17 12:47 PM   [place], [place], [address] Phone: [phone]   Fax:  [phone]  Name: Tara Cox MRN: [id number] Date of Birth: August 02, 1934

## 2017-07-19 ENCOUNTER — Ambulatory Visit
Admission: RE | Admit: 2017-07-19 | Discharge: 2017-07-19 | Disposition: A | Discharge status: Home / Self Care | Payer: Medicare Other | Source: Ambulatory Visit | Attending: Radiation Oncology | Admitting: Radiation Oncology

## 2017-07-19 DIAGNOSIS — C50111 Malignant neoplasm of central portion of right female breast: Secondary | ICD-10-CM | POA: Diagnosis not present

## 2017-07-22 ENCOUNTER — Ambulatory Visit
Admission: RE | Admit: 2017-07-22 | Discharge: 2017-07-22 | Disposition: A | Discharge status: Home / Self Care | Payer: Medicare Other | Source: Ambulatory Visit | Attending: Radiation Oncology | Admitting: Radiation Oncology

## 2017-07-22 DIAGNOSIS — C50111 Malignant neoplasm of central portion of right female breast: Secondary | ICD-10-CM | POA: Diagnosis not present

## 2017-07-23 ENCOUNTER — Ambulatory Visit
Admission: RE | Admit: 2017-07-23 | Discharge: 2017-07-23 | Disposition: A | Discharge status: Home / Self Care | Payer: Medicare Other | Source: Ambulatory Visit | Attending: Radiation Oncology | Admitting: Radiation Oncology

## 2017-07-23 ENCOUNTER — Other Ambulatory Visit: Payer: Medicare Other

## 2017-07-23 ENCOUNTER — Ambulatory Visit: Payer: Medicare Other

## 2017-07-23 DIAGNOSIS — C50111 Malignant neoplasm of central portion of right female breast: Secondary | ICD-10-CM | POA: Diagnosis not present

## 2017-07-24 ENCOUNTER — Inpatient Hospital Stay: Payer: Medicare Other | Attending: Oncology

## 2017-07-24 ENCOUNTER — Inpatient Hospital Stay: Payer: Medicare Other

## 2017-07-24 ENCOUNTER — Ambulatory Visit
Admission: RE | Admit: 2017-07-24 | Discharge: 2017-07-24 | Disposition: A | Discharge status: Home / Self Care | Payer: Medicare Other | Source: Ambulatory Visit | Attending: Radiation Oncology | Admitting: Radiation Oncology

## 2017-07-24 VITALS — BP 143/81 | HR 67 | Temp 98.1°F | Resp 17

## 2017-07-24 DIAGNOSIS — C50111 Malignant neoplasm of central portion of right female breast: Secondary | ICD-10-CM

## 2017-07-24 DIAGNOSIS — C50911 Malignant neoplasm of unspecified site of right female breast: Secondary | ICD-10-CM

## 2017-07-24 DIAGNOSIS — Z95828 Presence of other vascular implants and grafts: Secondary | ICD-10-CM | POA: Insufficient documentation

## 2017-07-24 DIAGNOSIS — Z5112 Encounter for antineoplastic immunotherapy: Secondary | ICD-10-CM | POA: Diagnosis not present

## 2017-07-24 DIAGNOSIS — Z170 Estrogen receptor positive status [ER+]: Secondary | ICD-10-CM

## 2017-07-24 LAB — COMPREHENSIVE METABOLIC PANEL
ALK PHOS: 81 U/L (ref 38–126)
ALT: 51 U/L — AB (ref 0–44)
AST: 30 U/L (ref 15–41)
Albumin: 4 g/dL (ref 3.5–5.0)
Anion gap: 8 (ref 5–15)
BUN: 17 mg/dL (ref 8–23)
CALCIUM: 9.6 mg/dL (ref 8.9–10.3)
CHLORIDE: 101 mmol/L (ref 98–111)
CO2: 32 mmol/L (ref 22–32)
CREATININE: 0.93 mg/dL (ref 0.44–1.00)
GFR calc Af Amer: >60 mL/min (ref >60)
GFR, EST NON AFRICAN AMERICAN: 57 mL/min — AB (ref >60)
Glucose, Bld: 124 mg/dL — ABNORMAL HIGH (ref 70–99)
Potassium: 3.6 mmol/L (ref 3.5–5.1)
Sodium: 141 mmol/L (ref 135–145)
Total Bilirubin: 0.5 mg/dL (ref 0.3–1.2)
Total Protein: 6.8 g/dL (ref 6.5–8.1)

## 2017-07-24 LAB — CBC WITH DIFFERENTIAL/PLATELET
Basophils Absolute: 0 10*3/uL (ref 0.0–0.1)
Basophils Relative: 1 %
EOS ABS: 0.1 10*3/uL (ref 0.0–0.5)
EOS PCT: 3 %
HCT: 42.8 % (ref 34.8–46.6)
Hemoglobin: 13.8 g/dL (ref 11.6–15.9)
LYMPHS ABS: 1.1 10*3/uL (ref 0.9–3.3)
Lymphocytes Relative: 35 %
MCH: 26.8 pg (ref 25.1–34.0)
MCHC: 32.2 g/dL (ref 31.5–36.0)
MCV: 83.3 fL (ref 79.5–101.0)
MONOS PCT: 10 %
Monocytes Absolute: 0.3 10*3/uL (ref 0.1–0.9)
Neutro Abs: 1.6 10*3/uL (ref 1.5–6.5)
Neutrophils Relative %: 51 %
PLATELETS: 164 10*3/uL (ref 145–400)
RBC: 5.14 MIL/uL (ref 3.70–5.45)
RDW: 13.9 % (ref 11.2–14.5)
WBC: 3.1 10*3/uL — AB (ref 3.9–10.3)

## 2017-07-24 MED ORDER — SODIUM CHLORIDE 0.9 % IV SOLN
6.0000 mg/kg | Freq: Once | INTRAVENOUS | Status: AC
  Administered 2017-07-24: 651 mg via INTRAVENOUS
  Filled 2017-07-24: qty 31

## 2017-07-24 MED ORDER — SODIUM CHLORIDE 0.9 % IV SOLN
Freq: Once | INTRAVENOUS | Status: AC
  Administered 2017-07-24: 11:00:00 via INTRAVENOUS

## 2017-07-24 MED ORDER — ACETAMINOPHEN 325 MG PO TABS
ORAL_TABLET | ORAL | Status: AC
  Filled 2017-07-24: qty 2

## 2017-07-24 MED ORDER — DIPHENHYDRAMINE HCL 25 MG PO CAPS
ORAL_CAPSULE | ORAL | Status: AC
  Filled 2017-07-24: qty 2

## 2017-07-24 MED ORDER — SODIUM CHLORIDE 0.9% FLUSH
10.0000 mL | INTRAVENOUS | Status: DC | PRN
  Administered 2017-07-24: 10 mL
  Filled 2017-07-24: qty 10

## 2017-07-24 MED ORDER — DIPHENHYDRAMINE HCL 25 MG PO CAPS
25.0000 mg | ORAL_CAPSULE | Freq: Once | ORAL | Status: AC
  Administered 2017-07-24: 25 mg via ORAL

## 2017-07-24 MED ORDER — HEPARIN SOD (PORK) LOCK FLUSH 100 UNIT/ML IV SOLN
500.0000 [IU] | Freq: Once | INTRAVENOUS | Status: AC | PRN
  Administered 2017-07-24: 500 [IU]
  Filled 2017-07-24: qty 5

## 2017-07-24 MED ORDER — ACETAMINOPHEN 325 MG PO TABS
650.0000 mg | ORAL_TABLET | Freq: Once | ORAL | Status: AC
  Administered 2017-07-24: 650 mg via ORAL

## 2017-07-24 NOTE — Progress Notes (Signed)
Prior to infusion pt stated she has been having intermittent heart "flip flops" Pt states while laying on her right side at home at night she notices that her heart "pounds harder but doesn't hurt" Pt denies any pain with these episodes. Pt denies any sweating or shortness of breath. Pt states the feeling does not radiate. Pt vital signs stable. Pt denies any other complaints. Pt states it does not feel like her a-fib but she is worried. This RN spoke with Dr. Virgie Dad nurse to make him aware. Pt encouraged to call cardiologist and make appt for follow up. Pt aware to call with any other complaints or concerns. Pt and family verbalized understanding. Pt tolerated infusion without difficulty and left the clinic in no apparent distress.

## 2017-07-24 NOTE — Patient Instructions (Signed)
[facility] Discharge Instructions for Patients Receiving Chemotherapy  Today you received the following chemotherapy agents Herceptin  To help prevent nausea and vomiting after your treatment, we encourage you to take your nausea medication as prescribed by MD.    If you develop nausea and vomiting that is not controlled by your nausea medication, call the clinic.   BELOW ARE SYMPTOMS THAT SHOULD BE REPORTED IMMEDIATELY:  *FEVER GREATER THAN 100.5 F  *CHILLS WITH OR WITHOUT FEVER  NAUSEA AND VOMITING THAT IS NOT CONTROLLED WITH YOUR NAUSEA MEDICATION  *UNUSUAL SHORTNESS OF BREATH  *UNUSUAL BRUISING OR BLEEDING  TENDERNESS IN MOUTH AND THROAT WITH OR WITHOUT PRESENCE OF ULCERS  *URINARY PROBLEMS  *BOWEL PROBLEMS  UNUSUAL RASH Items with * indicate a potential emergency and should be followed up as soon as possible.  Feel free to call the clinic should you have any questions or concerns. The clinic phone number is [phone].  Please show the CHEMO ALERT CARD at check-in to the Emergency Department and triage nurse.   

## 2017-07-25 ENCOUNTER — Ambulatory Visit
Admission: RE | Admit: 2017-07-25 | Discharge: 2017-07-25 | Disposition: A | Discharge status: Home / Self Care | Payer: Medicare Other | Source: Ambulatory Visit | Attending: Radiation Oncology | Admitting: Radiation Oncology

## 2017-07-25 DIAGNOSIS — C50111 Malignant neoplasm of central portion of right female breast: Secondary | ICD-10-CM | POA: Diagnosis not present

## 2017-07-26 ENCOUNTER — Ambulatory Visit
Admission: RE | Admit: 2017-07-26 | Discharge: 2017-07-26 | Disposition: A | Discharge status: Home / Self Care | Payer: Medicare Other | Source: Ambulatory Visit | Attending: Radiation Oncology | Admitting: Radiation Oncology

## 2017-07-26 DIAGNOSIS — C50111 Malignant neoplasm of central portion of right female breast: Secondary | ICD-10-CM | POA: Diagnosis not present

## 2017-07-29 ENCOUNTER — Ambulatory Visit
Admission: RE | Admit: 2017-07-29 | Discharge: 2017-07-29 | Disposition: A | Discharge status: Home / Self Care | Payer: Medicare Other | Source: Ambulatory Visit | Attending: Radiation Oncology | Admitting: Radiation Oncology

## 2017-07-29 ENCOUNTER — Telehealth (HOSPITAL_COMMUNITY): Payer: Self-pay | Admitting: *Deleted

## 2017-07-29 ENCOUNTER — Ambulatory Visit: Payer: Medicare Other | Admitting: Radiation Oncology

## 2017-07-29 DIAGNOSIS — C50111 Malignant neoplasm of central portion of right female breast: Secondary | ICD-10-CM | POA: Diagnosis not present

## 2017-07-29 NOTE — Telephone Encounter (Signed)
Patient called stating she feels like she is having extra beats every once in awhile.  No chest pain or shortness of breath.  She wanted to make sure it was nothing to worry about.  I advised her that as long as it doesn't become more frequent and she isn't having any symptoms that she may just be having occasional PVC's and shouldn't worry.  I advised her to call back if she becomes symptomatic otherwise will just follow up as scheduled in august.

## 2017-07-30 ENCOUNTER — Ambulatory Visit
Admission: RE | Admit: 2017-07-30 | Discharge: 2017-07-30 | Disposition: A | Discharge status: Home / Self Care | Payer: Medicare Other | Source: Ambulatory Visit | Attending: Radiation Oncology | Admitting: Radiation Oncology

## 2017-07-30 ENCOUNTER — Ambulatory Visit: Payer: Medicare Other | Admitting: Radiation Oncology

## 2017-07-30 DIAGNOSIS — C50111 Malignant neoplasm of central portion of right female breast: Secondary | ICD-10-CM | POA: Diagnosis not present

## 2017-07-31 ENCOUNTER — Ambulatory Visit
Admission: RE | Admit: 2017-07-31 | Discharge: 2017-07-31 | Disposition: A | Discharge status: Home / Self Care | Payer: Medicare Other | Source: Ambulatory Visit | Attending: Radiation Oncology | Admitting: Radiation Oncology

## 2017-07-31 DIAGNOSIS — C50111 Malignant neoplasm of central portion of right female breast: Secondary | ICD-10-CM | POA: Diagnosis not present

## 2017-08-01 ENCOUNTER — Ambulatory Visit
Admission: RE | Admit: 2017-08-01 | Discharge: 2017-08-01 | Disposition: A | Discharge status: Home / Self Care | Payer: Medicare Other | Source: Ambulatory Visit | Attending: Radiation Oncology | Admitting: Radiation Oncology

## 2017-08-01 DIAGNOSIS — C50111 Malignant neoplasm of central portion of right female breast: Secondary | ICD-10-CM | POA: Diagnosis not present

## 2017-08-02 ENCOUNTER — Ambulatory Visit
Admission: RE | Admit: 2017-08-02 | Discharge: 2017-08-02 | Disposition: A | Discharge status: Home / Self Care | Payer: Medicare Other | Source: Ambulatory Visit | Attending: Radiation Oncology | Admitting: Radiation Oncology

## 2017-08-02 ENCOUNTER — Ambulatory Visit: Payer: Medicare Other | Admitting: Radiation Oncology

## 2017-08-02 DIAGNOSIS — C50111 Malignant neoplasm of central portion of right female breast: Secondary | ICD-10-CM | POA: Diagnosis not present

## 2017-08-05 ENCOUNTER — Ambulatory Visit
Admission: RE | Admit: 2017-08-05 | Discharge: 2017-08-05 | Disposition: A | Discharge status: Home / Self Care | Payer: Medicare Other | Source: Ambulatory Visit | Attending: Radiation Oncology | Admitting: Radiation Oncology

## 2017-08-05 DIAGNOSIS — C50111 Malignant neoplasm of central portion of right female breast: Secondary | ICD-10-CM | POA: Diagnosis not present

## 2017-08-05 DIAGNOSIS — C50911 Malignant neoplasm of unspecified site of right female breast: Secondary | ICD-10-CM

## 2017-08-05 MED ORDER — RADIAPLEXRX EX GEL
Freq: Once | CUTANEOUS | Status: AC
  Administered 2017-08-05: 10:00:00 via TOPICAL

## 2017-08-06 ENCOUNTER — Ambulatory Visit
Admission: RE | Admit: 2017-08-06 | Discharge: 2017-08-06 | Disposition: A | Discharge status: Home / Self Care | Payer: Medicare Other | Source: Ambulatory Visit | Attending: Radiation Oncology | Admitting: Radiation Oncology

## 2017-08-06 DIAGNOSIS — C50111 Malignant neoplasm of central portion of right female breast: Secondary | ICD-10-CM | POA: Diagnosis not present

## 2017-08-07 ENCOUNTER — Ambulatory Visit
Admission: RE | Admit: 2017-08-07 | Discharge: 2017-08-07 | Disposition: A | Discharge status: Home / Self Care | Payer: Medicare Other | Source: Ambulatory Visit | Attending: Radiation Oncology | Admitting: Radiation Oncology

## 2017-08-07 DIAGNOSIS — C50111 Malignant neoplasm of central portion of right female breast: Secondary | ICD-10-CM | POA: Diagnosis not present

## 2017-08-08 ENCOUNTER — Ambulatory Visit
Admission: RE | Admit: 2017-08-08 | Discharge: 2017-08-08 | Disposition: A | Discharge status: Home / Self Care | Payer: Medicare Other | Source: Ambulatory Visit | Attending: Radiation Oncology | Admitting: Radiation Oncology

## 2017-08-08 DIAGNOSIS — C50111 Malignant neoplasm of central portion of right female breast: Secondary | ICD-10-CM | POA: Insufficient documentation

## 2017-08-09 ENCOUNTER — Ambulatory Visit
Admission: RE | Admit: 2017-08-09 | Discharge: 2017-08-09 | Disposition: A | Discharge status: Home / Self Care | Payer: Medicare Other | Source: Ambulatory Visit | Attending: Radiation Oncology | Admitting: Radiation Oncology

## 2017-08-09 DIAGNOSIS — C50111 Malignant neoplasm of central portion of right female breast: Secondary | ICD-10-CM | POA: Diagnosis not present

## 2017-08-12 ENCOUNTER — Ambulatory Visit
Admission: RE | Admit: 2017-08-12 | Discharge: 2017-08-12 | Disposition: A | Discharge status: Home / Self Care | Payer: Medicare Other | Source: Ambulatory Visit | Attending: Radiation Oncology | Admitting: Radiation Oncology

## 2017-08-12 DIAGNOSIS — C50111 Malignant neoplasm of central portion of right female breast: Secondary | ICD-10-CM | POA: Diagnosis not present

## 2017-08-13 ENCOUNTER — Other Ambulatory Visit: Payer: Medicare Other

## 2017-08-13 ENCOUNTER — Inpatient Hospital Stay: Payer: Medicare Other | Attending: Oncology

## 2017-08-13 ENCOUNTER — Ambulatory Visit
Admission: RE | Admit: 2017-08-13 | Discharge: 2017-08-13 | Disposition: A | Discharge status: Home / Self Care | Payer: Medicare Other | Source: Ambulatory Visit | Attending: Radiation Oncology | Admitting: Radiation Oncology

## 2017-08-13 ENCOUNTER — Inpatient Hospital Stay: Payer: Medicare Other

## 2017-08-13 VITALS — BP 147/66 | HR 66 | Temp 98.6°F | Resp 16 | Wt 231.5 lb

## 2017-08-13 DIAGNOSIS — C50111 Malignant neoplasm of central portion of right female breast: Secondary | ICD-10-CM | POA: Diagnosis not present

## 2017-08-13 DIAGNOSIS — Z170 Estrogen receptor positive status [ER+]: Principal | ICD-10-CM

## 2017-08-13 DIAGNOSIS — Z5112 Encounter for antineoplastic immunotherapy: Secondary | ICD-10-CM | POA: Insufficient documentation

## 2017-08-13 DIAGNOSIS — Z95828 Presence of other vascular implants and grafts: Secondary | ICD-10-CM

## 2017-08-13 DIAGNOSIS — C50911 Malignant neoplasm of unspecified site of right female breast: Secondary | ICD-10-CM

## 2017-08-13 LAB — COMPREHENSIVE METABOLIC PANEL
ALT: 50 U/L — AB (ref 0–44)
AST: 33 U/L (ref 15–41)
Albumin: 3.7 g/dL (ref 3.5–5.0)
Alkaline Phosphatase: 73 U/L (ref 38–126)
Anion gap: 10 (ref 5–15)
BUN: 20 mg/dL (ref 8–23)
CHLORIDE: 102 mmol/L (ref 98–111)
CO2: 29 mmol/L (ref 22–32)
CREATININE: 0.82 mg/dL (ref 0.44–1.00)
Calcium: 9.5 mg/dL (ref 8.9–10.3)
GFR calc Af Amer: >60 mL/min (ref >60)
GFR calc non Af Amer: >60 mL/min (ref >60)
Glucose, Bld: 97 mg/dL (ref 70–99)
POTASSIUM: 3.8 mmol/L (ref 3.5–5.1)
SODIUM: 141 mmol/L (ref 135–145)
Total Bilirubin: 0.5 mg/dL (ref 0.3–1.2)
Total Protein: 6.5 g/dL (ref 6.5–8.1)

## 2017-08-13 LAB — CBC WITH DIFFERENTIAL/PLATELET
BASOS ABS: 0 10*3/uL (ref 0.0–0.1)
BASOS PCT: 1 %
EOS ABS: 0.1 10*3/uL (ref 0.0–0.5)
EOS PCT: 2 %
HCT: 41.1 % (ref 34.8–46.6)
Hemoglobin: 13.4 g/dL (ref 11.6–15.9)
Lymphocytes Relative: 23 %
Lymphs Abs: 0.7 10*3/uL — ABNORMAL LOW (ref 0.9–3.3)
MCH: 26.7 pg (ref 25.1–34.0)
MCHC: 32.6 g/dL (ref 31.5–36.0)
MCV: 81.7 fL (ref 79.5–101.0)
Monocytes Absolute: 0.3 10*3/uL (ref 0.1–0.9)
Monocytes Relative: 12 %
Neutro Abs: 1.7 10*3/uL (ref 1.5–6.5)
Neutrophils Relative %: 62 %
PLATELETS: 133 10*3/uL — AB (ref 145–400)
RBC: 5.02 MIL/uL (ref 3.70–5.45)
RDW: 14.1 % (ref 11.2–14.5)
WBC: 2.8 10*3/uL — AB (ref 3.9–10.3)

## 2017-08-13 MED ORDER — SODIUM CHLORIDE 0.9 % IV SOLN
Freq: Once | INTRAVENOUS | Status: AC
  Administered 2017-08-13: 12:00:00 via INTRAVENOUS
  Filled 2017-08-13: qty 250

## 2017-08-13 MED ORDER — SODIUM CHLORIDE 0.9% FLUSH
10.0000 mL | INTRAVENOUS | Status: DC | PRN
  Administered 2017-08-13: 10 mL
  Filled 2017-08-13: qty 10

## 2017-08-13 MED ORDER — SODIUM CHLORIDE 0.9 % IV SOLN
6.0000 mg/kg | Freq: Once | INTRAVENOUS | Status: AC
  Administered 2017-08-13: 651 mg via INTRAVENOUS
  Filled 2017-08-13: qty 31

## 2017-08-13 MED ORDER — DIPHENHYDRAMINE HCL 25 MG PO CAPS
ORAL_CAPSULE | ORAL | Status: AC
  Filled 2017-08-13: qty 1

## 2017-08-13 MED ORDER — ACETAMINOPHEN 325 MG PO TABS
ORAL_TABLET | ORAL | Status: AC
Start: 2017-08-13 — End: ?
  Filled 2017-08-13: qty 2

## 2017-08-13 MED ORDER — ACETAMINOPHEN 325 MG PO TABS
650.0000 mg | ORAL_TABLET | Freq: Once | ORAL | Status: AC
  Administered 2017-08-13: 650 mg via ORAL

## 2017-08-13 MED ORDER — DIPHENHYDRAMINE HCL 25 MG PO CAPS
25.0000 mg | ORAL_CAPSULE | Freq: Once | ORAL | Status: AC
  Administered 2017-08-13: 25 mg via ORAL

## 2017-08-13 MED ORDER — HEPARIN SOD (PORK) LOCK FLUSH 100 UNIT/ML IV SOLN
500.0000 [IU] | Freq: Once | INTRAVENOUS | Status: AC | PRN
  Administered 2017-08-13: 500 [IU]
  Filled 2017-08-13: qty 5

## 2017-08-13 NOTE — Patient Instructions (Signed)
[facility] Discharge Instructions for Patients Receiving Chemotherapy  Today you received the following chemotherapy agents: Trastuzumab (Herceptin).  To help prevent nausea and vomiting after your treatment, we encourage you to take your nausea medication as prescribed. If you develop nausea and vomiting that is not controlled by your nausea medication, call the clinic.   BELOW ARE SYMPTOMS THAT SHOULD BE REPORTED IMMEDIATELY:  *FEVER GREATER THAN 100.5 F  *CHILLS WITH OR WITHOUT FEVER  NAUSEA AND VOMITING THAT IS NOT CONTROLLED WITH YOUR NAUSEA MEDICATION  *UNUSUAL SHORTNESS OF BREATH  *UNUSUAL BRUISING OR BLEEDING  TENDERNESS IN MOUTH AND THROAT WITH OR WITHOUT PRESENCE OF ULCERS  *URINARY PROBLEMS  *BOWEL PROBLEMS  UNUSUAL RASH Items with * indicate a potential emergency and should be followed up as soon as possible.  Feel free to call the clinic should you have any questions or concerns. The clinic phone number is [phone].  Please show the CHEMO ALERT CARD at check-in to the Emergency Department and triage nurse.   

## 2017-08-14 ENCOUNTER — Ambulatory Visit
Admission: RE | Admit: 2017-08-14 | Discharge: 2017-08-14 | Disposition: A | Discharge status: Home / Self Care | Payer: Medicare Other | Source: Ambulatory Visit | Attending: Radiation Oncology | Admitting: Radiation Oncology

## 2017-08-14 ENCOUNTER — Ambulatory Visit: Payer: Medicare Other | Admitting: Physician Assistant

## 2017-08-14 DIAGNOSIS — C50111 Malignant neoplasm of central portion of right female breast: Secondary | ICD-10-CM | POA: Diagnosis not present

## 2017-08-15 ENCOUNTER — Ambulatory Visit
Admission: RE | Admit: 2017-08-15 | Discharge: 2017-08-15 | Disposition: A | Discharge status: Home / Self Care | Payer: Medicare Other | Source: Ambulatory Visit | Attending: Radiation Oncology | Admitting: Radiation Oncology

## 2017-08-15 ENCOUNTER — Ambulatory Visit: Payer: Medicare Other

## 2017-08-15 DIAGNOSIS — C50111 Malignant neoplasm of central portion of right female breast: Secondary | ICD-10-CM | POA: Diagnosis not present

## 2017-08-16 ENCOUNTER — Encounter: Payer: Self-pay | Admitting: Radiation Oncology

## 2017-08-16 ENCOUNTER — Ambulatory Visit
Admission: RE | Admit: 2017-08-16 | Discharge: 2017-08-16 | Disposition: A | Discharge status: Home / Self Care | Payer: Medicare Other | Source: Ambulatory Visit | Attending: Radiation Oncology | Admitting: Radiation Oncology

## 2017-08-16 DIAGNOSIS — C50111 Malignant neoplasm of central portion of right female breast: Secondary | ICD-10-CM | POA: Diagnosis not present

## 2017-08-21 ENCOUNTER — Ambulatory Visit (HOSPITAL_COMMUNITY)
Admission: RE | Admit: 2017-08-21 | Discharge: 2017-08-21 | Disposition: A | Discharge status: Home / Self Care | Payer: Medicare Other | Source: Ambulatory Visit | Attending: Cardiology | Admitting: Cardiology

## 2017-08-21 ENCOUNTER — Ambulatory Visit (HOSPITAL_BASED_OUTPATIENT_CLINIC_OR_DEPARTMENT_OTHER)
Admission: RE | Admit: 2017-08-21 | Discharge: 2017-08-21 | Disposition: A | Discharge status: Home / Self Care | Payer: Medicare Other | Source: Ambulatory Visit | Attending: Cardiology | Admitting: Cardiology

## 2017-08-21 ENCOUNTER — Ambulatory Visit (HOSPITAL_COMMUNITY)
Admission: RE | Admit: 2017-08-21 | Discharge: 2017-08-21 | Disposition: A | Discharge status: Home / Self Care | Payer: Medicare Other | Source: Ambulatory Visit | Attending: Family Medicine | Admitting: Family Medicine

## 2017-08-21 ENCOUNTER — Other Ambulatory Visit: Payer: Self-pay | Admitting: Rheumatology

## 2017-08-21 VITALS — BP 140/61 | HR 74 | Wt 228.6 lb

## 2017-08-21 DIAGNOSIS — K589 Irritable bowel syndrome without diarrhea: Secondary | ICD-10-CM | POA: Diagnosis not present

## 2017-08-21 DIAGNOSIS — Z9011 Acquired absence of right breast and nipple: Secondary | ICD-10-CM | POA: Insufficient documentation

## 2017-08-21 DIAGNOSIS — Z7901 Long term (current) use of anticoagulants: Secondary | ICD-10-CM | POA: Insufficient documentation

## 2017-08-21 DIAGNOSIS — K219 Gastro-esophageal reflux disease without esophagitis: Secondary | ICD-10-CM | POA: Insufficient documentation

## 2017-08-21 DIAGNOSIS — Z87891 Personal history of nicotine dependence: Secondary | ICD-10-CM | POA: Insufficient documentation

## 2017-08-21 DIAGNOSIS — C50111 Malignant neoplasm of central portion of right female breast: Secondary | ICD-10-CM | POA: Diagnosis not present

## 2017-08-21 DIAGNOSIS — Z96651 Presence of right artificial knee joint: Secondary | ICD-10-CM | POA: Diagnosis not present

## 2017-08-21 DIAGNOSIS — I10 Essential (primary) hypertension: Secondary | ICD-10-CM | POA: Insufficient documentation

## 2017-08-21 DIAGNOSIS — E785 Hyperlipidemia, unspecified: Secondary | ICD-10-CM | POA: Insufficient documentation

## 2017-08-21 DIAGNOSIS — Z79899 Other long term (current) drug therapy: Secondary | ICD-10-CM | POA: Diagnosis not present

## 2017-08-21 DIAGNOSIS — R002 Palpitations: Secondary | ICD-10-CM

## 2017-08-21 DIAGNOSIS — I480 Paroxysmal atrial fibrillation: Secondary | ICD-10-CM | POA: Insufficient documentation

## 2017-08-21 DIAGNOSIS — Z170 Estrogen receptor positive status [ER+]: Secondary | ICD-10-CM | POA: Diagnosis present

## 2017-08-21 NOTE — Patient Instructions (Signed)
Please follow instructions for [facility] and return on 08/24/2017.  Call our clinic at [phone], Option 5 if you have any questions.  Echocardiogram and Follow up in 3 months.

## 2017-08-21 NOTE — Progress Notes (Signed)
Oncology: Dr. Jana Hakim  82 with history of pseudogout, paroxysmal atrial fibrillation, osteoarthritis, and breast cancer was referred by Dr. Jana Hakim for cardio-oncology evaluation.    She was diagnosed in 3/19 with right breast cancer, ER+/PR+/HER2+.  She had right mastectomy in 4/19 with Herceptin x 1 year starting afterwards.  She has now completed XRT.   Today, she is in NSR with PACs.  She has noted palpitations recently, does not think it feels like atrial fibrillation.  No significant exertional dyspnea. No chest pain.     ECG: NSR, PACs (personally reviewed)  Labs (10/18): creatinine 0.91  PMH: 1. CPPD/pseudogout 2. GERD 3. Degenerative disc disease s/p discectomy 4. Right TKR 5. HTN 6. IBS 7. Hyperlipidemia 8. Atrial fibrillation: Paroxysmal, she is on warfarin.  9. Cardiolite 12/14 was normal. 10. Breast cancer: Diagnosed 3/19 with right breast cancer, ER+/PR+/HER2+.  Right mastectomy in 4/19 with Herceptin x 1 year afterwards.  Completed XRT.  - Echo (4/19): EF 60-65%, mild focal basal septal hypertrophy, GLS -18.1%, normal RV size and systolic function.  - Echo (8/19): EF 60-65%, GLS -97.9%, grade 2 diastolic dysfunction, normal RV size and systolic function, mild MR.   Social History   Socioeconomic History  . Marital status: Married    Spouse name: Not on file  . Number of children: Not on file  . Years of education: Not on file  . Highest education level: Not on file  Occupational History  . Not on file  Social Needs  . Financial resource strain: Not on file  . Food insecurity:    Worry: Not on file    Inability: Not on file  . Transportation needs:    Medical: Not on file    Non-medical: Not on file  Tobacco Use  . Smoking status: Former Smoker    Packs/day: 1.00    Years: 20.00    Pack years: 20.00    Types: Cigarettes    Last attempt to quit: 02/19/1990    Years since quitting: 27.5  . Smokeless tobacco: Never Used  . Tobacco comment: ' i QUIT  SMOKING MANY YEARS AGO "  Substance and Sexual Activity  . Alcohol use: No  . Drug use: Never  . Sexual activity: Not on file  Lifestyle  . Physical activity:    Days per week: Not on file    Minutes per session: Not on file  . Stress: Not on file  Relationships  . Social connections:    Talks on phone: Not on file    Gets together: Not on file    Attends religious service: Not on file    Active member of club or organization: Not on file    Attends meetings of clubs or organizations: Not on file    Relationship status: Not on file  . Intimate partner violence:    Fear of current or ex partner: Not on file    Emotionally abused: Not on file    Physically abused: Not on file    Forced sexual activity: Not on file  Other Topics Concern  . Not on file  Social History Narrative  . Not on file   Family History  Problem Relation Age of Onset  . Other Mother   . Varicose Veins Mother   . Emphysema Mother   . Heart disease Father        heart attack  . Diabetes Sister   . Other Sister        pacemaker  . Diabetes  Sister   . Heart disease Sister    ROS: All systems reviewed and negative except as per HPI.   Current Outpatient Medications  Medication Sig Dispense Refill  . acetaminophen (TYLENOL) 650 MG CR tablet Take 650 mg by mouth every 8 (eight) hours as needed for pain.    Marland Kitchen anastrozole (ARIMIDEX) 1 MG tablet Take 1 tablet (1 mg total) by mouth daily. 90 tablet 4  . Ascorbic Acid (VITAMIN C WITH ROSE HIPS) 500 MG tablet Take 500 mg by mouth daily.    Marland Kitchen aspirin-sod bicarb-citric acid (ALKA-SELTZER) 325 MG TBEF tablet Take 325 mg by mouth every 6 (six) hours as needed.    Marland Kitchen atorvastatin (LIPITOR) 20 MG tablet Take 20 mg by mouth at bedtime.     . benzonatate (TESSALON) 200 MG capsule Take 200 mg by mouth 3 (three) times daily as needed for cough.    . Calcium Carbonate-Vit D-Min (CALCIUM 600+D3 PLUS MINERALS) 600-800 MG-UNIT TABS Take 1 tablet by mouth at bedtime.    .  Cholecalciferol (VITAMIN D) 2000 units CAPS Take 2,000 Units by mouth daily.    . colchicine 0.6 MG tablet TAKE 1 TABLET DAILY BY  MOUTH. 90 tablet 1  . docusate sodium (COLACE) 250 MG capsule Take 250 mg by mouth daily as needed for constipation.    . furosemide (LASIX) 20 MG tablet Take 20 mg by mouth daily.      Marland Kitchen gabapentin (NEURONTIN) 300 MG capsule Take 300 mg by mouth at bedtime.     Marland Kitchen HYDROcodone-acetaminophen (NORCO/VICODIN) 5-325 MG tablet Take 1 tablet by mouth every 6 (six) hours as needed for moderate pain. 20 tablet 0  . hydrocortisone (ANUSOL-HC) 2.5 % rectal cream Place 1 application rectally 2 (two) times daily as needed for hemorrhoids or itching.    . hydroxychloroquine (PLAQUENIL) 200 MG tablet TAKE 1 TABLET BY MOUTH DAILY 30 tablet 0  . lidocaine-prilocaine (EMLA) cream Apply 1 application topically as needed. 30 g 0  . losartan-hydrochlorothiazide (HYZAAR) 100-25 MG per tablet Take 1 tablet by mouth daily. IN THE MORNING.    . magnesium oxide (MAG-OX) 400 MG tablet Take 400 mg by mouth daily.     . metoprolol tartrate (LOPRESSOR) 25 MG tablet Take 25 mg by mouth daily with lunch.     . Multiple Vitamin (MULTIVITAMIN) tablet Take 1 tablet by mouth daily. CENTRUM    . NONFORMULARY OR COMPOUNDED ITEM Apply 1-2 g topically 4 (four) times daily as needed for pain. BACLOFEN-DOXEPIN-GABAPENTIN-TOPIRAMATE-PENTOXIFYLLINE  2  . omeprazole (PRILOSEC) 40 MG capsule Take 40 mg by mouth daily as needed (FOR ACID REFLUX/INDIGESTION).   12  . OVER THE COUNTER MEDICATION Take 600 mcg by mouth daily as needed (AT ONSET OF MIGRAINE). MYGRAFEW (TANACETUM PARTHENIUM)    . potassium chloride SA (K-DUR,KLOR-CON) 20 MEQ tablet Take 10 mEq by mouth daily.    . Trastuzumab (HERCEPTIN IV) Inject into the vein every 21 ( twenty-one) days.    Marland Kitchen triamcinolone cream (KENALOG) 0.1 % Apply 1 application topically 2 (two) times daily as needed (Vaginal Itching).    . warfarin (COUMADIN) 5 MG tablet Take 0.5-1  tablets (2.5-5 mg total) by mouth daily. Take 5 mg tablet daily or as directed by pharmacist from [facility] (Patient taking differently: Take 5 mg by mouth at bedtime. Taking according to doctors instructions after each lab check dosages vary.) 40 tablet 1   No current facility-administered medications for this encounter.    BP 140/61  Pulse 74   Wt 103.7 kg (228 lb 9.6 oz)   SpO2 98%   BMI 35.80 kg/m  General: NAD Neck: No JVD, no thyromegaly or thyroid nodule.  Lungs: Clear to auscultation bilaterally with normal respiratory effort. CV: Nondisplaced PMI.  Heart regular S1/S2, no S3/S4, no murmur.  No peripheral edema.  No carotid bruit.  Normal pedal pulses.  Abdomen: Soft, nontender, no hepatosplenomegaly, no distention.  Skin: Intact without lesions or rashes.  Neurologic: Alert and oriented x 3.  Psych: Normal affect. Extremities: No clubbing or cyanosis.  HEENT: Normal.   Assessment/Plan: 1. Breast cancer: She will be on Herceptin for a total of a year.  I reviewed today's echo, EF and strain parameters are normal.   - Repeat echo in 3 months.   2. Atrial fibrillation: Paroxysmal.  She is in NSR today.   - Continue warfarin.  3. HTN: BP borderline high.  Continue current meds for now.  4. Palpitations: Increased recently, does not think they feel like prior atrial fibrillation.  - Will have her wear Zio patch for 3 days.   Followup in 3 months with echo.    Loralie Champagne 08/21/2017

## 2017-08-21 NOTE — Telephone Encounter (Signed)
Last visit: 06/12/2017 Next visit: 11/13/2017 Labs: 08/13/2017 ALT 50, WBC 2.8  Eye exam: 06/2016  Left message on machine to advise patient she needs updated eye exam.   Okay to refill 30 day supply, per Dr. Estanislado Pandy.

## 2017-08-21 NOTE — Progress Notes (Signed)
  Echocardiogram 2D Echocardiogram has been performed.  Jennette Dubin 08/21/2017, 10:05 AM

## 2017-08-22 ENCOUNTER — Telehealth: Payer: Self-pay | Admitting: Rheumatology

## 2017-08-22 NOTE — Telephone Encounter (Signed)
Patient left a voicemail stating she was returning your call.   

## 2017-08-22 NOTE — Telephone Encounter (Signed)
Attempted to call patient and left message on machine for patient to call the office.  

## 2017-08-22 NOTE — Telephone Encounter (Signed)
See previous telephone encounter.

## 2017-08-22 NOTE — Telephone Encounter (Signed)
Advised patient we received plaquenil eye exam report from Dr. Katy Fitch.

## 2017-08-22 NOTE — Telephone Encounter (Signed)
Patient called stating she had her Plaquenil eye exam on Friday 08/16/17 at Dr. Zenia Resides office.   Patient states she called their office and they will fax her report today.

## 2017-08-26 NOTE — Progress Notes (Signed)
  Radiation Oncology         [phone] ________________________________  Name: Tara Cox MRN: [id number]  Date: 08/16/2017  DOB: [birth year]  End of Treatment Note  Diagnosis:   83 y.o. female with Stage IIA, [id number], grade 2, triple positive invasive lobular carcinoma of the right breast  Indication for treatment:  Curative       Radiation treatment dates:   07/02/2017 - 08/16/2017  Site/dose:   The patient initially received a dose of 50.4 Gy in 28 fractions to the right chest wall and supraclavicular region. This was delivered using a 3-D conformal, 4 field technique. The patient then received a boost to the mastectomy scar. This delivered an additional 10 Gy in 5 fractions using an en face electron field. The total dose was 60.4 Gy.  Narrative: The patient tolerated radiation treatment relatively well.   The patient had some expected skin irritation as she progressed during treatment. Moist desquamation was not present at the end of treatment.  Plan: The patient has completed radiation treatment. The patient will return to radiation oncology clinic for routine followup in one month. I advised the patient to call or return sooner if they have any questions or concerns related to their recovery or treatment. ________________________________  Jodelle Gross, MD, PhD  This document serves as a record of services personally performed by Kyung Rudd, MD. It was created on his behalf by Rae Lips, a trained medical scribe. The creation of this record is based on the scribe's personal observations and the provider's statements to them. This document has been checked and approved by the attending provider.

## 2017-09-02 ENCOUNTER — Telehealth (HOSPITAL_COMMUNITY): Payer: Self-pay | Admitting: *Deleted

## 2017-09-02 MED ORDER — METOPROLOL SUCCINATE ER 25 MG PO TB24: 25.0000 mg | ORAL_TABLET | Freq: Every day | ORAL | 3 refills | Status: DC

## 2017-09-02 NOTE — Progress Notes (Signed)
[facility] [facility]  [phone] Fax:(336) [phone]     ID: MADALYNE Cox DOB: 15-Nov-1934  MR#: [id number]  ZHY#:[id number]  Patient Care Team: [name], MD as PCP - General (Family Medicine) Clairissa Valvano, Virgie Dad, MD as Consulting Physician (Oncology) Erroll Luna, MD as Consulting Physician (General Surgery) Kyung Rudd, MD as Consulting Physician (Radiation Oncology) Vickey Huger, MD as Consulting Physician (Orthopedic Surgery) Bo Merino, MD as Consulting Physician (Rheumatology) Lorretta Harp, MD as Consulting Physician (Cardiology) OTHER MD:   CHIEF COMPLAINT: Triple positive breast cancer  CURRENT TREATMENT: Anastrozole, trastuzumab   HISTORY OF CURRENT ILLNESS: From the original intake note:  [name] had mammography at the breast center October 11, 2011 showing calcifications in the upper right breast.  Six-month follow-up was suggested, but  the patient did not follow-up.  More recently she noticed nipple inversion on the right breast and followed this without reporting it over the last several months. She did undergo bilateral diagnostic mammography with tomography and right breast ultrasonography at [facility] on 03/26/2017 showing: breast density category B. There is a highly suspicious right retroareolar mass  measuring approximately 2.3 x 1.5 x 2.8 cm  with associated calcifications, together the mass and calcifications measure up to 3.3 cm mammographically. There are 2 lymph nodes in the right axilla with borderline thickened cortices, 1 of which measures 1 x 0.9 x 0.6 cm with a cortex thickness of 0.4 mm.  Accordingly on 03/29/2017 she proceeded to biopsy of the right breast and one axillary lymph node.. The pathology from this procedure showed ([id number]): Invasive lobular carcinoma,grade II, E-cadherin negative. The right axillary lymph node was negative for carcinoma. Prognostic indicators significant for: estrogen  receptor, 100% positive and progesterone receptor, 100% positive, both with strong staining intensity. Proliferation marker Ki67 at 25%. HER2 amplified with ratios  HER2/CEP17 signals 2.22 and average HER2 copies per cell 3.00  The patient's subsequent history is as detailed below.  INTERVAL HISTORY: Tara Cox returns today for follow up and treatment of her triple positive breast cancer, accompanied by her husband. She is currently undergoing radiation treatments. She has pealing skin with small lesions in the area of treatment. She occasionally feels stabbing pain throughout the breast. She also has decreased energy, but she feels this is mostly because of her knee pain.  She continues on anastrozole, with good tolerance. She has occasional hot flashes that are not intense. She denies issues with vaginal dryness. She obtains this at no cost.   She also receives trastuzumab every 21 days, with a dose due today. She tolerates this well without any significant complications.   Ultrasound of the right axilla on 07/16/2017 at [facility] showed: Probably benign scar tissue within the lower RIGHT axilla, underlying the lateral aspect of the mastectomy scar, measuring approximately 1.2 cm greatest dimension, corresponding to the area of clinical concern. A 81-monthfollow- up was reccomended.     REVIEW OF SYSTEMS: ELachereports that she is having pain in her left knee. She is receiving cortisone gel shots under Dr. LLorre Nick but she notes that the pain relief has not kicked in yet. She is doing upper body exercises. She denies unusual headaches, visual changes, nausea, vomiting, or dizziness. There has been no unusual cough, phlegm production, or pleurisy. There has been no change in bowel or bladder habits. She denies unexplained fatigue or unexplained weight loss, bleeding, rash, or fever. A detailed review of systems was otherwise stable.    PAST  MEDICAL HISTORY: Past Medical History:  Diagnosis  Date  . A-fib ([place])   . Arthritis   . 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 DDD (degenerative disc disease), lumbar 12/08/2015   S/P discectomy   . Diaphragmatic hernia 12/08/2015  . Dysrhythmia    A-Fib  . Edema of lower extremity 06/29/09   Lower Venous Exam- normal. No evidence of thrombus or thrombophlebitis.  . Esophagitis 12/08/2015  . Frequent urination at night   . GERD (gastroesophageal reflux disease)   . Hemorrhoids   . History of calcium pyrophosphate deposition disease (CPPD) 12/08/2015  . History of miscarriage 12/08/2015   x3  . History of total right knee replacement 12/08/2015  . Hypertension 02/24/09   Echo-EF 62%; Myocardial Perfusion Study-Normal. No signifcant ischemia noted.  . IBS (irritable bowel syndrome)   . Migraine   . Osteoarthritis of both feet 12/08/2015  . Osteoarthritis of both hands 12/08/2015  . PONV (postoperative nausea and vomiting)   . Uterine fibroid 12/08/2015    PAST SURGICAL HISTORY: Past Surgical History:  Procedure Laterality Date  . ABDOMINAL HYSTERECTOMY  1974  . APPENDECTOMY    . BACK SURGERY  2001  . BUNIONECTOMY  1986  . CESAREAN SECTION  1971  . COLONOSCOPY    . COLONOSCOPY WITH PROPOFOL N/A 05/28/2014   Procedure: COLONOSCOPY WITH PROPOFOL;  Surgeon: Carol Ada, MD;  Location: [facility];  Service: Endoscopy;  Laterality: N/A;  . ESOPHAGOGASTRODUODENOSCOPY (EGD) WITH PROPOFOL N/A 05/28/2014   Procedure: ESOPHAGOGASTRODUODENOSCOPY (EGD) WITH PROPOFOL;  Surgeon: Carol Ada, MD;  Location: [facility];  Service: Endoscopy;  Laterality: N/A;  . EXPLORATORY LAPAROTOMY     open procedure  . EYE SURGERY Bilateral    cataract removal  . PORTACATH PLACEMENT Right 05/07/2017   Procedure: INSERTION PORT-A-CATH;  Surgeon: Erroll Luna, MD;  Location: [facility];  Service: General;  Laterality: Right;  . SHOULDER SURGERY Right   . SIMPLE MASTECTOMY WITH AXILLARY SENTINEL NODE BIOPSY Right 05/07/2017   Procedure: RIGHT SIMPLE  MASTECTOMY WITH AXILLARY SENTINEL NODE BIOPSY;  Surgeon: Erroll Luna, MD;  Location: [facility];  Service: General;  Laterality: Right;  . TOTAL KNEE ARTHROPLASTY Right 01/26/2013   DR Ronnie Derby  . TOTAL KNEE ARTHROPLASTY Right 01/26/2013   Procedure: TOTAL KNEE ARTHROPLASTY;  Surgeon: Vickey Huger, MD;  Location: [facility];  Service: Orthopedics;  Laterality: Right;  . UTERINE FIBROID SURGERY  1967    FAMILY HISTORY Family History  Problem Relation Age of Onset  . Other Mother   . Varicose Veins Mother   . Emphysema Mother   . Heart disease Father        heart attack  . Diabetes Sister   . Other Sister        pacemaker  . Diabetes Sister   . Heart disease Sister   The patient's father died in 51 (around age 33) in [place] due to sudden death. The patient's mother died around age 84 due to emphysema. The patient was born in [place], [place]. The patient has 2 brothers and 3 sisters. One sister had a lumpectomy in 1991, but this was not malignant. Another sister died of pancreatic cancer. The patient denies a family history of breast or ovarian cancer.    GYNECOLOGIC HISTORY:  No LMP recorded. Patient has had a hysterectomy. Menarche: 83 years old Age at first live birth: 83 years old She is GXP1.  Her LMP was at age 30 when she underwent hysterectomy, without salpingo-oophorectomy. She was on Premarin for more  than 20 years.  She never used oral contraceptives.   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 Her husband, Broadus John, used to be in banking, and he is currently retired. The patient's biological daughter, Clarene Critchley, is a Health and safety inspector at a contact center in [place]. The patient's adopted daughter, Lenna Sciara, is an Social research officer, government in [place]. The patient's adopted son, Gaspar Bidding, is unemployed in [place]. The patient has 8 grandchildren, one of whom belongs to [place] and works as a Pharmacist, community.  The patient belongs to [employer].      ADVANCED DIRECTIVES:    HEALTH MAINTENANCE: Social History   Tobacco Use  . Smoking status: Former Smoker    Packs/day: 1.00    Years: 20.00    Pack years: 20.00    Types: Cigarettes    Last attempt to quit: 02/19/1990    Years since quitting: 27.5  . Smokeless tobacco: Never Used  . Tobacco comment: ' i QUIT SMOKING MANY YEARS AGO "  Substance Use Topics  . Alcohol use: No  . Drug use: Never     Colonoscopy: Dr. Benson Norway 2016  PAP:  Bone density:   Allergies  Allergen Reactions  . Oxycodone Itching, Nausea And Vomiting and Other (See Comments)    Full body weakness   . Hydrocodone Other (See Comments)    Reports they make her feel sick/drowsy  . Percocet [Oxycodone-Acetaminophen] Itching    Pt says she can take tylenol   . Soma [Carisoprodol] Nausea And Vomiting  . Ultram [Tramadol Hcl] Nausea And Vomiting    Current Outpatient Medications  Medication Sig Dispense Refill  . acetaminophen (TYLENOL) 650 MG CR tablet Take 650 mg by mouth every 8 (eight) hours as needed for pain.    Marland Kitchen anastrozole (ARIMIDEX) 1 MG tablet Take 1 tablet (1 mg total) by mouth daily. 90 tablet 4  . Ascorbic Acid (VITAMIN C WITH ROSE HIPS) 500 MG tablet Take 500 mg by mouth daily.    Marland Kitchen aspirin-sod bicarb-citric acid (ALKA-SELTZER) 325 MG TBEF tablet Take 325 mg by mouth every 6 (six) hours as needed.    Marland Kitchen atorvastatin (LIPITOR) 20 MG tablet Take 20 mg by mouth at bedtime.     . benzonatate (TESSALON) 200 MG capsule Take 200 mg by mouth 3 (three) times daily as needed for cough.    . Calcium Carbonate-Vit D-Min (CALCIUM 600+D3 PLUS MINERALS) 600-800 MG-UNIT TABS Take 1 tablet by mouth at bedtime.    . Cholecalciferol (VITAMIN D) 2000 units CAPS Take 2,000 Units by mouth daily.    . colchicine 0.6 MG tablet TAKE 1 TABLET DAILY BY  MOUTH. 90 tablet 1  . docusate sodium (COLACE) 250 MG capsule Take 250 mg by mouth daily as needed for constipation.    . furosemide  (LASIX) 20 MG tablet Take 20 mg by mouth daily.      Marland Kitchen gabapentin (NEURONTIN) 300 MG capsule Take 300 mg by mouth at bedtime.     Marland Kitchen HYDROcodone-acetaminophen (NORCO/VICODIN) 5-325 MG tablet Take 1 tablet by mouth every 6 (six) hours as needed for moderate pain. 20 tablet 0  . hydrocortisone (ANUSOL-HC) 2.5 % rectal cream Place 1 application rectally 2 (two) times daily as needed for hemorrhoids or itching.    . hydroxychloroquine (PLAQUENIL) 200 MG tablet TAKE 1 TABLET BY MOUTH DAILY 30 tablet 0  . lidocaine-prilocaine (EMLA) cream Apply 1 application topically as needed. 30 g 0  .  losartan-hydrochlorothiazide (HYZAAR) 100-25 MG per tablet Take 1 tablet by mouth daily. IN THE MORNING.    . magnesium oxide (MAG-OX) 400 MG tablet Take 400 mg by mouth daily.     . metoprolol succinate (TOPROL-XL) 25 MG 24 hr tablet Take 1 tablet (25 mg total) by mouth daily. Take with or immediately following a meal. 90 tablet 3  . Multiple Vitamin (MULTIVITAMIN) tablet Take 1 tablet by mouth daily. CENTRUM    . NONFORMULARY OR COMPOUNDED ITEM Apply 1-2 g topically 4 (four) times daily as needed for pain. BACLOFEN-DOXEPIN-GABAPENTIN-TOPIRAMATE-PENTOXIFYLLINE  2  . omeprazole (PRILOSEC) 40 MG capsule Take 40 mg by mouth daily as needed (FOR ACID REFLUX/INDIGESTION).   12  . OVER THE COUNTER MEDICATION Take 600 mcg by mouth daily as needed (AT ONSET OF MIGRAINE). MYGRAFEW (TANACETUM PARTHENIUM)    . potassium chloride SA (K-DUR,KLOR-CON) 20 MEQ tablet Take 10 mEq by mouth daily.    . Trastuzumab (HERCEPTIN IV) Inject into the vein every 21 ( twenty-one) days.    Marland Kitchen triamcinolone cream (KENALOG) 0.1 % Apply 1 application topically 2 (two) times daily as needed (Vaginal Itching).    . warfarin (COUMADIN) 5 MG tablet Take 0.5-1 tablets (2.5-5 mg total) by mouth daily. Take 5 mg tablet daily or as directed by pharmacist from [facility] (Patient taking differently: Take 5 mg by mouth at bedtime. Taking according to  doctors instructions after each lab check dosages vary.) 40 tablet 1   No current facility-administered medications for this visit.     OBJECTIVE: Older African-American woman who appears stated age  6:   09/03/17 1023  BP: (!) 136/55  Pulse: 72  Resp: 16  Temp: (!) 97.5 F (36.4 C)  SpO2: 98%     Body mass index is 36.04 kg/m.   Wt Readings from Last 3 Encounters:  09/03/17 230 lb 1.6 oz (104.4 kg)  08/21/17 228 lb 9.6 oz (103.7 kg)  08/13/17 231 lb 8 oz (105 kg)      ECOG FS:1 - Symptomatic but completely ambulatory Sclerae unicteric, EOMs intact Oropharynx clear and moist No cervical or supraclavicular adenopathy Lungs no rales or rhonchi Heart regular rate and rhythm Abd soft, nontender, positive bowel sounds MSK no focal spinal tenderness, no upper extremity lymphedema Neuro: nonfocal, well oriented, appropriate affect Breasts: The right breast is status post mastectomy and radiation.  There is some dry and slight wet desquamation in addition to hyperpigmentation.  The left breast is benign.  Both axillae are benign.     LAB RESULTS:  CMP     Component Value Date/Time   NA 141 08/13/2017 0924   NA 142 09/22/2015   K 3.8 08/13/2017 0924   CL 102 08/13/2017 0924   CO2 29 08/13/2017 0924   GLUCOSE 97 08/13/2017 0924   BUN 20 08/13/2017 0924   BUN 19 09/22/2015   CREATININE 0.82 08/13/2017 0924   CREATININE 0.91 (H) 10/11/2016 1136   CALCIUM 9.5 08/13/2017 0924   PROT 6.5 08/13/2017 0924   ALBUMIN 3.7 08/13/2017 0924   AST 33 08/13/2017 0924   ALT 50 (H) 08/13/2017 0924   ALKPHOS 73 08/13/2017 0924   BILITOT 0.5 08/13/2017 0924   GFRNONAA >60 08/13/2017 0924   GFRNONAA 60 10/11/2016 1136   GFRAA >60 08/13/2017 0924   GFRAA 69 10/11/2016 1136    No results found for: TOTALPROTELP, ALBUMINELP, A1GS, A2GS, BETS, BETA2SER, GAMS, MSPIKE, SPEI  No results found for: KPAFRELGTCHN, LAMBDASER, KAPLAMBRATIO  Lab Results  Component Value Date   WBC  2.7 (L) 09/03/2017   NEUTROABS 1.4 (L) 09/03/2017   HGB 13.7 09/03/2017   HCT 42.8 09/03/2017   MCV 83.1 09/03/2017   PLT 161 09/03/2017    '@LASTCHEMISTRY' @  No results found for: LABCA2  No components found for: [id number]  No results for input(s): INR in the last 168 hours.  No results found for: LABCA2  No results found for: ION629  No results found for: BMW413  No results found for: KGM010  No results found for: CA2729  No components found for: HGQUANT  No results found for: CEA1 / No results found for: CEA1   No results found for: AFPTUMOR  No results found for: CHROMOGRNA  No results found for: PSA1  Appointment on 09/03/2017  Component Date Value Ref Range Status  . WBC 09/03/2017 2.7* 3.9 - 10.3 K/uL Final  . RBC 09/03/2017 5.15  3.70 - 5.45 MIL/uL Final  . Hemoglobin 09/03/2017 13.7  11.6 - 15.9 g/dL Final  . HCT 09/03/2017 42.8  34.8 - 46.6 % Final  . MCV 09/03/2017 83.1  79.5 - 101.0 fL Final  . MCH 09/03/2017 26.6  25.1 - 34.0 pg Final  . MCHC 09/03/2017 32.0  31.5 - 36.0 g/dL Final  . RDW 09/03/2017 14.2  11.2 - 14.5 % Final  . Platelets 09/03/2017 161  145 - 400 K/uL Final  . Neutrophils Relative % 09/03/2017 51  % Final  . Neutro Abs 09/03/2017 1.4* 1.5 - 6.5 K/uL Final  . Lymphocytes Relative 09/03/2017 38  % Final  . Lymphs Abs 09/03/2017 1.0  0.9 - 3.3 K/uL Final  . Monocytes Relative 09/03/2017 8  % Final  . Monocytes Absolute 09/03/2017 0.2  0.1 - 0.9 K/uL Final  . Eosinophils Relative 09/03/2017 3  % Final  . Eosinophils Absolute 09/03/2017 0.1  0.0 - 0.5 K/uL Final  . Basophils Relative 09/03/2017 0  % Final  . Basophils Absolute 09/03/2017 0.0  0.0 - 0.1 K/uL Final   Performed at [facility], [address] [place], [place] [address]    (this displays the last labs from the last 3 days)  No results found for: TOTALPROTELP, ALBUMINELP, A1GS, A2GS, BETS, BETA2SER, GAMS, MSPIKE, SPEI (this displays SPEP  labs)  No results found for: KPAFRELGTCHN, LAMBDASER, KAPLAMBRATIO (kappa/lambda light chains)  No results found for: HGBA, HGBA2QUANT, HGBFQUANT, HGBSQUAN (Hemoglobinopathy evaluation)   Lab Results  Component Value Date   LDH 162 10/22/2006    No results found for: IRON, TIBC, IRONPCTSAT (Iron and TIBC)  No results found for: FERRITIN  Urinalysis    Component Value Date/Time   COLORURINE YELLOW 09/27/2015 [place] 09/27/2015 1532   LABSPEC 1.015 09/27/2015 1532   PHURINE 6.5 09/27/2015 1532   GLUCOSEU NEGATIVE 09/27/2015 1532   HGBUR TRACE (A) 09/27/2015 1532   BILIRUBINUR NEGATIVE 09/27/2015 [place] 09/27/2015 1532   PROTEINUR NEGATIVE 09/27/2015 1532   UROBILINOGEN 0.2 01/19/2013 0954   NITRITE NEGATIVE 09/27/2015 [place] 09/27/2015 1532     STUDIES: Ultrasound of the right axilla on 07/16/2017 at [facility] showed: Probably benign scar tissue within the lower RIGHT axilla, underlying the lateral aspect of the mastectomy scar, measuring approximately 1.2 cm greatest dimension, corresponding to the area of clinical concern. A 73-monthfollow- up was reccomended.   ELIGIBLE FOR AVAILABLE RESEARCH PROTOCOL: Exact sciences study  ASSESSMENT: 83y.o. [place] woman status post central  right breast biopsy 03/29/2017 for a clinical T1c N0, stage IA invasive lobular carcinoma, grade 2, strongly estrogen and progesterone receptor positive, also HER-2 amplified, with an MIB-1 of 25%  (1) status post right mastectomy with sentinel lymph node sampling 05/07/2017 for a pT3 pN1, stage IIB invasive lobular carcinoma, grade 3, with negative margins.  (2) 07/02/2017 - 08/16/2017 Site/dose:   The patient initially received a dose of 50.4 Gy in 28 fractions to the right chest wall and supraclavicular region. This was delivered using a 3-D conformal, 4 field technique. The patient then received a boost to the mastectomy  scar. This delivered an additional 10 Gy in 5 fractions using an en face electron field. The total dose was 60.4 Gy.  (3) trastuzumab adjuvantly for 1 year to start 06/04/2017  (a) echocardiogram 04/16/2017 shows an ejection fraction of 60-65%.  (b) echocardiogram 08/21/2017 shows an ejection fraction in the 60-65% range  (4) anastrozole started 05/28/2017  (a) bone density  (5) small seroma right axilla, requiring follow-up  PLAN: Paytyn tolerated the adjuvant radiation generally quite well.  She is having some skin problems and she will stop downstairs today to get additional creams to use but I expect within the next 2 weeks the skin of her chest on the right will have completely healed.  She is not planning on reconstruction.  I think she would benefit from prostheses and bras and I have written the prescription for that.  She will need a bone density before she sees me again in December and that order has been entered.  She will also have an echocardiogram next November.  Finally she will need a little more physical therapy starting late September early October.  This order has been placed.  Otherwise the plan is to continue anastrozole for a total of 5 years and trastuzumab every 3 weeks through May 2020  She knows to call for any other issues that may develop before the next visit.  Manahil Vanzile, Virgie Dad, MD  09/03/17 10:35 AM Medical Oncology and Hematology [facility] [address] [place],  [address] Tel. [phone]    Fax. [phone]  Alice Rieger, am acting as scribe for Chauncey Cruel MD.  I, Lurline Del MD, have reviewed the above documentation for accuracy and completeness, and I agree with the above.

## 2017-09-02 NOTE — Telephone Encounter (Signed)
Result Notes for LONG TERM MONITOR (3-14 DAYS)   Notes recorded by Darron Doom, RN on 09/02/2017 at 4:05 PM EDT Called and spoke with patient, she's agreeable with plan. MAR updated and new medication sent to preferred pharmacy. ------  Notes recorded by Larey Dresser, MD on 08/29/2017 at 10:17 PM EDT Short runs of SVT may cause palpitations. Would stop metoprolol tartrate and start Toprol XL 25 mg daily.

## 2017-09-03 ENCOUNTER — Inpatient Hospital Stay: Payer: Medicare Other

## 2017-09-03 ENCOUNTER — Telehealth: Payer: Self-pay | Admitting: Oncology

## 2017-09-03 ENCOUNTER — Inpatient Hospital Stay (HOSPITAL_BASED_OUTPATIENT_CLINIC_OR_DEPARTMENT_OTHER): Payer: Medicare Other | Admitting: Oncology

## 2017-09-03 VITALS — BP 136/55 | HR 72 | Temp 97.5°F | Resp 16 | Ht 67.0 in | Wt 230.1 lb

## 2017-09-03 DIAGNOSIS — N644 Mastodynia: Secondary | ICD-10-CM | POA: Diagnosis not present

## 2017-09-03 DIAGNOSIS — M25562 Pain in left knee: Secondary | ICD-10-CM

## 2017-09-03 DIAGNOSIS — Z9011 Acquired absence of right breast and nipple: Secondary | ICD-10-CM

## 2017-09-03 DIAGNOSIS — Z95828 Presence of other vascular implants and grafts: Secondary | ICD-10-CM

## 2017-09-03 DIAGNOSIS — Z170 Estrogen receptor positive status [ER+]: Principal | ICD-10-CM

## 2017-09-03 DIAGNOSIS — L988 Other specified disorders of the skin and subcutaneous tissue: Secondary | ICD-10-CM | POA: Diagnosis not present

## 2017-09-03 DIAGNOSIS — C50111 Malignant neoplasm of central portion of right female breast: Secondary | ICD-10-CM | POA: Diagnosis not present

## 2017-09-03 DIAGNOSIS — Z87891 Personal history of nicotine dependence: Secondary | ICD-10-CM

## 2017-09-03 DIAGNOSIS — I10 Essential (primary) hypertension: Secondary | ICD-10-CM

## 2017-09-03 DIAGNOSIS — Z79811 Long term (current) use of aromatase inhibitors: Secondary | ICD-10-CM

## 2017-09-03 DIAGNOSIS — Z5112 Encounter for antineoplastic immunotherapy: Secondary | ICD-10-CM | POA: Diagnosis not present

## 2017-09-03 DIAGNOSIS — C50911 Malignant neoplasm of unspecified site of right female breast: Secondary | ICD-10-CM

## 2017-09-03 LAB — COMPREHENSIVE METABOLIC PANEL
ALK PHOS: 84 U/L (ref 38–126)
ALT: 65 U/L — ABNORMAL HIGH (ref 0–44)
ANION GAP: 6 (ref 5–15)
AST: 42 U/L — ABNORMAL HIGH (ref 15–41)
Albumin: 3.8 g/dL (ref 3.5–5.0)
BILIRUBIN TOTAL: 0.3 mg/dL (ref 0.3–1.2)
BUN: 18 mg/dL (ref 8–23)
CALCIUM: 9.7 mg/dL (ref 8.9–10.3)
CO2: 32 mmol/L (ref 22–32)
Chloride: 105 mmol/L (ref 98–111)
Creatinine, Ser: 0.91 mg/dL (ref 0.44–1.00)
GFR calc non Af Amer: 58 mL/min — ABNORMAL LOW (ref >60)
Glucose, Bld: 102 mg/dL — ABNORMAL HIGH (ref 70–99)
Potassium: 3.8 mmol/L (ref 3.5–5.1)
SODIUM: 143 mmol/L (ref 135–145)
Total Protein: 6.7 g/dL (ref 6.5–8.1)

## 2017-09-03 LAB — CBC WITH DIFFERENTIAL/PLATELET
Basophils Absolute: 0 10*3/uL (ref 0.0–0.1)
Basophils Relative: 0 %
EOS ABS: 0.1 10*3/uL (ref 0.0–0.5)
Eosinophils Relative: 3 %
HCT: 42.8 % (ref 34.8–46.6)
HEMOGLOBIN: 13.7 g/dL (ref 11.6–15.9)
LYMPHS ABS: 1 10*3/uL (ref 0.9–3.3)
Lymphocytes Relative: 38 %
MCH: 26.6 pg (ref 25.1–34.0)
MCHC: 32 g/dL (ref 31.5–36.0)
MCV: 83.1 fL (ref 79.5–101.0)
MONOS PCT: 8 %
Monocytes Absolute: 0.2 10*3/uL (ref 0.1–0.9)
NEUTROS PCT: 51 %
Neutro Abs: 1.4 10*3/uL — ABNORMAL LOW (ref 1.5–6.5)
Platelets: 161 10*3/uL (ref 145–400)
RBC: 5.15 MIL/uL (ref 3.70–5.45)
RDW: 14.2 % (ref 11.2–14.5)
WBC: 2.7 10*3/uL — ABNORMAL LOW (ref 3.9–10.3)

## 2017-09-03 MED ORDER — SODIUM CHLORIDE 0.9% FLUSH
10.0000 mL | INTRAVENOUS | Status: DC | PRN
  Administered 2017-09-03: 10 mL
  Filled 2017-09-03: qty 10

## 2017-09-03 MED ORDER — ACETAMINOPHEN 325 MG PO TABS
ORAL_TABLET | ORAL | Status: AC
  Filled 2017-09-03: qty 2

## 2017-09-03 MED ORDER — SODIUM CHLORIDE 0.9 % IV SOLN
Freq: Once | INTRAVENOUS | Status: AC
  Administered 2017-09-03: 12:00:00 via INTRAVENOUS
  Filled 2017-09-03: qty 250

## 2017-09-03 MED ORDER — DIPHENHYDRAMINE HCL 25 MG PO CAPS
25.0000 mg | ORAL_CAPSULE | Freq: Once | ORAL | Status: AC
  Administered 2017-09-03: 25 mg via ORAL

## 2017-09-03 MED ORDER — TRASTUZUMAB CHEMO 150 MG IV SOLR
6.0000 mg/kg | Freq: Once | INTRAVENOUS | Status: AC
  Administered 2017-09-03: 651 mg via INTRAVENOUS
  Filled 2017-09-03: qty 31

## 2017-09-03 MED ORDER — DIPHENHYDRAMINE HCL 25 MG PO CAPS
ORAL_CAPSULE | ORAL | Status: AC
  Filled 2017-09-03: qty 1

## 2017-09-03 MED ORDER — ACETAMINOPHEN 325 MG PO TABS
650.0000 mg | ORAL_TABLET | Freq: Once | ORAL | Status: AC
  Administered 2017-09-03: 650 mg via ORAL

## 2017-09-03 MED ORDER — HEPARIN SOD (PORK) LOCK FLUSH 100 UNIT/ML IV SOLN
500.0000 [IU] | Freq: Once | INTRAVENOUS | Status: AC | PRN
  Administered 2017-09-03: 500 [IU]
  Filled 2017-09-03: qty 5

## 2017-09-03 NOTE — Patient Instructions (Signed)
[facility] Discharge Instructions for Patients Receiving Chemotherapy  Today you received the following chemotherapy agents Herceptin  To help prevent nausea and vomiting after your treatment, we encourage you to take your nausea medication as directed   If you develop nausea and vomiting that is not controlled by your nausea medication, call the clinic.   BELOW ARE SYMPTOMS THAT SHOULD BE REPORTED IMMEDIATELY:  *FEVER GREATER THAN 100.5 F  *CHILLS WITH OR WITHOUT FEVER  NAUSEA AND VOMITING THAT IS NOT CONTROLLED WITH YOUR NAUSEA MEDICATION  *UNUSUAL SHORTNESS OF BREATH  *UNUSUAL BRUISING OR BLEEDING  TENDERNESS IN MOUTH AND THROAT WITH OR WITHOUT PRESENCE OF ULCERS  *URINARY PROBLEMS  *BOWEL PROBLEMS  UNUSUAL RASH Items with * indicate a potential emergency and should be followed up as soon as possible.  Feel free to call the clinic should you have any questions or concerns. The clinic phone number is [phone].  Please show the CHEMO ALERT CARD at check-in to the Emergency Department and triage nurse.   

## 2017-09-03 NOTE — Telephone Encounter (Signed)
Per 8/27 los. Added GM appt with 12/10 appts.  Gave patient avs and calendar.  Patient will get calendars for after December at next visit.   Bone density order noted.  Patient aware.

## 2017-09-04 ENCOUNTER — Telehealth: Payer: Self-pay | Admitting: Oncology

## 2017-09-04 NOTE — Telephone Encounter (Signed)
Per 8/27 los.  Appts scheduled.  Patient to pick up scheduled (after Dec) at next visit.

## 2017-09-24 ENCOUNTER — Inpatient Hospital Stay: Payer: Medicare Other

## 2017-09-24 ENCOUNTER — Other Ambulatory Visit: Payer: Self-pay | Admitting: Rheumatology

## 2017-09-24 ENCOUNTER — Inpatient Hospital Stay: Payer: Medicare Other | Attending: Oncology

## 2017-09-24 VITALS — BP 149/73 | HR 76 | Temp 97.9°F | Resp 16 | Wt 229.2 lb

## 2017-09-24 DIAGNOSIS — C50111 Malignant neoplasm of central portion of right female breast: Secondary | ICD-10-CM

## 2017-09-24 DIAGNOSIS — Z95828 Presence of other vascular implants and grafts: Secondary | ICD-10-CM

## 2017-09-24 DIAGNOSIS — Z170 Estrogen receptor positive status [ER+]: Principal | ICD-10-CM

## 2017-09-24 DIAGNOSIS — Z5112 Encounter for antineoplastic immunotherapy: Secondary | ICD-10-CM | POA: Insufficient documentation

## 2017-09-24 DIAGNOSIS — C50911 Malignant neoplasm of unspecified site of right female breast: Secondary | ICD-10-CM

## 2017-09-24 LAB — CBC WITH DIFFERENTIAL/PLATELET
BASOS ABS: 0 10*3/uL (ref 0.0–0.1)
BASOS PCT: 0 %
Eosinophils Absolute: 0.1 10*3/uL (ref 0.0–0.5)
Eosinophils Relative: 4 %
HEMATOCRIT: 41.3 % (ref 34.8–46.6)
Hemoglobin: 13.1 g/dL (ref 11.6–15.9)
LYMPHS ABS: 1.3 10*3/uL (ref 0.9–3.3)
Lymphocytes Relative: 35 %
MCH: 26.3 pg (ref 25.1–34.0)
MCHC: 31.7 g/dL (ref 31.5–36.0)
MCV: 82.9 fL (ref 79.5–101.0)
MONO ABS: 0.3 10*3/uL (ref 0.1–0.9)
MONOS PCT: 8 %
NEUTROS ABS: 1.9 10*3/uL (ref 1.5–6.5)
NEUTROS PCT: 53 %
PLATELETS: 138 10*3/uL — AB (ref 145–400)
RBC: 4.98 MIL/uL (ref 3.70–5.45)
RDW: 14.8 % — AB (ref 11.2–14.5)
WBC: 3.7 10*3/uL — ABNORMAL LOW (ref 3.9–10.3)

## 2017-09-24 LAB — COMPREHENSIVE METABOLIC PANEL
ALT: 38 U/L (ref 0–44)
ANION GAP: 5 (ref 5–15)
AST: 27 U/L (ref 15–41)
Albumin: 3.7 g/dL (ref 3.5–5.0)
Alkaline Phosphatase: 78 U/L (ref 38–126)
BUN: 18 mg/dL (ref 8–23)
CO2: 33 mmol/L — AB (ref 22–32)
Calcium: 9.9 mg/dL (ref 8.9–10.3)
Chloride: 105 mmol/L (ref 98–111)
Creatinine, Ser: 0.85 mg/dL (ref 0.44–1.00)
Glucose, Bld: 97 mg/dL (ref 70–99)
Potassium: 4 mmol/L (ref 3.5–5.1)
SODIUM: 143 mmol/L (ref 135–145)
Total Bilirubin: 0.5 mg/dL (ref 0.3–1.2)
Total Protein: 6.5 g/dL (ref 6.5–8.1)

## 2017-09-24 MED ORDER — SODIUM CHLORIDE 0.9 % IV SOLN
Freq: Once | INTRAVENOUS | Status: AC
  Administered 2017-09-24: 11:00:00 via INTRAVENOUS
  Filled 2017-09-24: qty 250

## 2017-09-24 MED ORDER — SODIUM CHLORIDE 0.9% FLUSH
10.0000 mL | INTRAVENOUS | Status: DC | PRN
  Administered 2017-09-24: 10 mL
  Filled 2017-09-24: qty 10

## 2017-09-24 MED ORDER — ACETAMINOPHEN 325 MG PO TABS
ORAL_TABLET | ORAL | Status: AC
  Filled 2017-09-24: qty 2

## 2017-09-24 MED ORDER — DIPHENHYDRAMINE HCL 25 MG PO CAPS
ORAL_CAPSULE | ORAL | Status: AC
  Filled 2017-09-24: qty 1

## 2017-09-24 MED ORDER — DIPHENHYDRAMINE HCL 25 MG PO CAPS
25.0000 mg | ORAL_CAPSULE | Freq: Once | ORAL | Status: AC
  Administered 2017-09-24: 25 mg via ORAL

## 2017-09-24 MED ORDER — HEPARIN SOD (PORK) LOCK FLUSH 100 UNIT/ML IV SOLN
500.0000 [IU] | Freq: Once | INTRAVENOUS | Status: AC | PRN
  Administered 2017-09-24: 500 [IU]
  Filled 2017-09-24: qty 5

## 2017-09-24 MED ORDER — TRASTUZUMAB CHEMO 150 MG IV SOLR
6.0000 mg/kg | Freq: Once | INTRAVENOUS | Status: AC
  Administered 2017-09-24: 651 mg via INTRAVENOUS
  Filled 2017-09-24: qty 31

## 2017-09-24 MED ORDER — ACETAMINOPHEN 325 MG PO TABS
650.0000 mg | ORAL_TABLET | Freq: Once | ORAL | Status: AC
  Administered 2017-09-24: 650 mg via ORAL

## 2017-09-24 NOTE — Telephone Encounter (Signed)
Last visit: 06/12/2017 Next visit: 11/13/2017 Labs: 08/13/2017 ALT 50, WBC 2.8   Okay to refill per Dr. Estanislado Pandy

## 2017-09-24 NOTE — Patient Instructions (Signed)
[facility] Discharge Instructions for Patients Receiving Chemotherapy  Today you received the following chemotherapy agents Herceptin  To help prevent nausea and vomiting after your treatment, we encourage you to take your nausea medication as directed   If you develop nausea and vomiting that is not controlled by your nausea medication, call the clinic.   BELOW ARE SYMPTOMS THAT SHOULD BE REPORTED IMMEDIATELY:  *FEVER GREATER THAN 100.5 F  *CHILLS WITH OR WITHOUT FEVER  NAUSEA AND VOMITING THAT IS NOT CONTROLLED WITH YOUR NAUSEA MEDICATION  *UNUSUAL SHORTNESS OF BREATH  *UNUSUAL BRUISING OR BLEEDING  TENDERNESS IN MOUTH AND THROAT WITH OR WITHOUT PRESENCE OF ULCERS  *URINARY PROBLEMS  *BOWEL PROBLEMS  UNUSUAL RASH Items with * indicate a potential emergency and should be followed up as soon as possible.  Feel free to call the clinic should you have any questions or concerns. The clinic phone number is [phone].  Please show the CHEMO ALERT CARD at check-in to the Emergency Department and triage nurse.   

## 2017-09-30 ENCOUNTER — Ambulatory Visit: Payer: Medicare Other | Attending: Surgery | Admitting: Rehabilitation

## 2017-09-30 ENCOUNTER — Other Ambulatory Visit: Payer: Self-pay

## 2017-09-30 ENCOUNTER — Ambulatory Visit
Admission: RE | Admit: 2017-09-30 | Discharge: 2017-09-30 | Disposition: A | Discharge status: Home / Self Care | Payer: Medicare Other | Source: Ambulatory Visit | Attending: Radiation Oncology | Admitting: Radiation Oncology

## 2017-09-30 ENCOUNTER — Encounter: Payer: Self-pay | Admitting: Rehabilitation

## 2017-09-30 ENCOUNTER — Encounter: Payer: Self-pay | Admitting: Radiation Oncology

## 2017-09-30 VITALS — BP 137/67 | HR 71 | Temp 98.6°F | Resp 18 | Ht 67.0 in | Wt 230.2 lb

## 2017-09-30 DIAGNOSIS — Z7982 Long term (current) use of aspirin: Secondary | ICD-10-CM | POA: Diagnosis not present

## 2017-09-30 DIAGNOSIS — Z170 Estrogen receptor positive status [ER+]: Secondary | ICD-10-CM | POA: Insufficient documentation

## 2017-09-30 DIAGNOSIS — R293 Abnormal posture: Secondary | ICD-10-CM

## 2017-09-30 DIAGNOSIS — Z79899 Other long term (current) drug therapy: Secondary | ICD-10-CM | POA: Diagnosis not present

## 2017-09-30 DIAGNOSIS — M25611 Stiffness of right shoulder, not elsewhere classified: Secondary | ICD-10-CM | POA: Diagnosis present

## 2017-09-30 DIAGNOSIS — Z885 Allergy status to narcotic agent status: Secondary | ICD-10-CM | POA: Insufficient documentation

## 2017-09-30 DIAGNOSIS — C50911 Malignant neoplasm of unspecified site of right female breast: Secondary | ICD-10-CM | POA: Diagnosis not present

## 2017-09-30 DIAGNOSIS — Z483 Aftercare following surgery for neoplasm: Secondary | ICD-10-CM | POA: Diagnosis present

## 2017-09-30 DIAGNOSIS — C50111 Malignant neoplasm of central portion of right female breast: Secondary | ICD-10-CM | POA: Insufficient documentation

## 2017-09-30 DIAGNOSIS — Z7901 Long term (current) use of anticoagulants: Secondary | ICD-10-CM | POA: Diagnosis not present

## 2017-09-30 NOTE — Progress Notes (Signed)
Radiation Oncology         [phone] ________________________________  Name: Tara Cox MRN: [id number]  Date of Service: 09/30/2017  DOB: [birth year]  Post Treatment Note  CC: [name], MD  Erroll Luna, MD  Diagnosis:   Tara Cox, grade 2, triple positive invasive lobular carcinoma of the right breast  Interval Since Last Radiation:  7 weeks   07/02/2017 - 08/16/2017: The patient initially received a dose of 50.4 Gy in 28 fractions to the right chest wall and supraclavicular region. This was delivered using a 3-D conformal, 4 field technique. The patient then received a boost to the mastectomy scar. This delivered an additional 10 Gy in 5 fractions using an en face electron field. The total dose was 60.4 Gy.  Narrative:  The patient returns today for routine follow-up. During treatment she did very well with radiotherapy and did not have significant desquamation.                             On review of systems, the patient states she's doing great and her skin is healing up nicely. She denies any other concerns today.  ALLERGIES:  is allergic to oxycodone; hydrocodone; percocet [oxycodone-acetaminophen]; soma [carisoprodol]; and ultram [tramadol hcl].  Meds: Current Outpatient Medications  Medication Sig Dispense Refill  . acetaminophen (TYLENOL) 650 MG CR tablet Take 650 mg by mouth every 8 (eight) hours as needed for pain.    Marland Kitchen anastrozole (ARIMIDEX) 1 MG tablet Take 1 tablet (1 mg total) by mouth daily. 90 tablet 4  . Ascorbic Acid (VITAMIN C WITH ROSE HIPS) 500 MG tablet Take 500 mg by mouth daily.    Marland Kitchen atorvastatin (LIPITOR) 20 MG tablet Take 20 mg by mouth at bedtime.     . Calcium Carbonate-Vit D-Cox (CALCIUM 600+D3 PLUS MINERALS) 600-800 MG-UNIT TABS Take 1 tablet by mouth at bedtime.    . Cholecalciferol (VITAMIN D) 2000 units CAPS Take 2,000 Units by mouth daily. Taking Vitamin D 3 2000 units daliy    . colchicine 0.6 MG tablet TAKE 1 TABLET  DAILY BY  MOUTH. 90 tablet 1  . furosemide (LASIX) 20 MG tablet Take 20 mg by mouth daily.      Marland Kitchen gabapentin (NEURONTIN) 300 MG capsule Take 300 mg by mouth at bedtime.     Marland Kitchen HYDROcodone-acetaminophen (NORCO/VICODIN) 5-325 MG tablet Take 1 tablet by mouth every 6 (six) hours as needed for moderate pain. 20 tablet 0  . hydrocortisone (ANUSOL-HC) 2.5 % rectal cream Place 1 application rectally 2 (two) times daily as needed for hemorrhoids or itching.    . hydroxychloroquine (PLAQUENIL) 200 MG tablet TAKE 1 TABLET BY MOUTH DAILY 30 tablet 0  . lidocaine-prilocaine (EMLA) cream Apply 1 application topically as needed. 30 g 0  . losartan-hydrochlorothiazide (HYZAAR) 100-25 MG per tablet Take 1 tablet by mouth daily. IN THE MORNING.    . magnesium oxide (MAG-OX) 400 MG tablet Take 400 mg by mouth daily.     . metoprolol succinate (TOPROL-XL) 25 MG 24 hr tablet Take 1 tablet (25 mg total) by mouth daily. Take with or immediately following a meal. 90 tablet 3  . Multiple Vitamin (MULTIVITAMIN) tablet Take 1 tablet by mouth daily. CENTRUM    . NONFORMULARY OR COMPOUNDED ITEM Apply 1-2 g topically 4 (four) times daily as needed for pain. BACLOFEN-DOXEPIN-GABAPENTIN-TOPIRAMATE-PENTOXIFYLLINE  2  . omeprazole (PRILOSEC) 40 MG capsule Take 40 mg by mouth daily as  needed (FOR ACID REFLUX/INDIGESTION).   12  . potassium chloride SA (K-DUR,KLOR-CON) 20 MEQ tablet Take 10 mEq by mouth daily.    . Trastuzumab (HERCEPTIN IV) Inject into the vein every 21 ( twenty-one) days.    Marland Kitchen triamcinolone cream (KENALOG) 0.1 % Apply 1 application topically 2 (two) times daily as needed (Vaginal Itching).    . warfarin (COUMADIN) 5 MG tablet Take 0.5-1 tablets (2.5-5 mg total) by mouth daily. Take 5 mg tablet daily or as directed by pharmacist from [facility] (Patient taking differently: Take 5 mg by mouth at bedtime. Taking according to doctors instructions after each lab check dosages vary. Clarify 09-30-17 Family  Dr.  Kathryne Eriksson  orders lab draws for coumadin presently taking one and a half tablets  To equal 7.5 mg Mondays and Wednesdays all others days of the week she takes 5 mg of the coumadin.) 40 tablet 1  . aspirin-sod bicarb-citric acid (ALKA-SELTZER) 325 MG TBEF tablet Take 325 mg by mouth every 6 (six) hours as needed.    . benzonatate (TESSALON) 200 MG capsule Take 200 mg by mouth 3 (three) times daily as needed for cough.    . docusate sodium (COLACE) 250 MG capsule Take 250 mg by mouth daily as needed for constipation.     No current facility-administered medications for this encounter.     Physical Findings:  height is 5\' 7"  (1.702 m) and weight is 230 lb 3.2 oz (104.4 kg). Her oral temperature is 98.6 F (37 C). Her blood pressure is 137/67 and her pulse is 71. Her respiration is 18 and oxygen saturation is 98%.  Pain Assessment Pain Score: 0-No pain/10 In general this is a well appearing African American female in no acute distress. She's alert and oriented x4 and appropriate throughout the examination. Cardiopulmonary assessment is negative for acute distress and she exhibits normal effort. The skin of the chest was examined and reveals well healing skin with mild hyperpigmentation of the right chest wall.    Lab Findings: Lab Results  Component Value Date   WBC 3.7 (L) 09/24/2017   HGB 13.1 09/24/2017   HCT 41.3 09/24/2017   MCV 82.9 09/24/2017   PLT 138 (L) 09/24/2017     Radiographic Findings: No results found.  Impression/Plan: 1. StageIIA,pT3N1aM0, grade 2, triple positive invasive lobular carcinoma of the right breast. The patient has been doing well since completion of radiotherapy. We discussed that we would be happy to continue to follow her as needed, but she will also continue to follow up with Dr. Jana Hakim in medical oncology. She was counseled on skin care as well as measures to avoid sun exposure to this area.  2. Survivorship. We discussed the importance of  survivorship evaluation and she is not yet but will soon be scheduled for this in the near future. She was also given the monthly calendar for access to resources offered within the cancer center.     Carola Rhine, PAC

## 2017-09-30 NOTE — Therapy (Signed)
[place], [place], [address] Phone: [phone]   Fax:  [phone]  Physical Therapy Evaluation  Patient Details  Name: Tara Cox MRN: [id number] Date of Birth: 06-05-1934 Referring Provider: Dr. Lurline Del   Encounter Date: 09/30/2017  PT End of Session - 09/30/17 1350    Visit Number  8    Number of Visits  14    Date for PT Re-Evaluation  10/21/17    PT Start Time  1302    PT Stop Time  1345    PT Time Calculation (min)  43 min    Activity Tolerance  Patient tolerated treatment well    Behavior During Therapy  [facility] for tasks assessed/performed       Past Medical History:  Diagnosis Date  . A-fib ([place])   . Arthritis   . 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 DDD (degenerative disc disease), lumbar 12/08/2015   S/P discectomy   . Diaphragmatic hernia 12/08/2015  . Dysrhythmia    A-Fib  . Edema of lower extremity 06/29/09   Lower Venous Exam- normal. No evidence of thrombus or thrombophlebitis.  . Esophagitis 12/08/2015  . Frequent urination at night   . GERD (gastroesophageal reflux disease)   . Hemorrhoids   . History of calcium pyrophosphate deposition disease (CPPD) 12/08/2015  . History of miscarriage 12/08/2015   x3  . History of total right knee replacement 12/08/2015  . Hypertension 02/24/09   Echo-EF 62%; Myocardial Perfusion Study-Normal. No signifcant ischemia noted.  . IBS (irritable bowel syndrome)   . Migraine   . Osteoarthritis of both feet 12/08/2015  . Osteoarthritis of both hands 12/08/2015  . PONV (postoperative nausea and vomiting)   . Uterine fibroid 12/08/2015    Past Surgical History:  Procedure Laterality Date  . ABDOMINAL HYSTERECTOMY  1974  . APPENDECTOMY    . BACK SURGERY  2001  . BUNIONECTOMY  1986  . CESAREAN SECTION  1971  . COLONOSCOPY    . COLONOSCOPY WITH PROPOFOL N/A 05/28/2014   Procedure: COLONOSCOPY WITH PROPOFOL;  Surgeon: Carol Ada, MD;  Location: [facility];  Service: Endoscopy;  Laterality: N/A;  . ESOPHAGOGASTRODUODENOSCOPY (EGD) WITH PROPOFOL N/A 05/28/2014   Procedure: ESOPHAGOGASTRODUODENOSCOPY (EGD) WITH PROPOFOL;  Surgeon: Carol Ada, MD;  Location: [facility];  Service: Endoscopy;  Laterality: N/A;  . EXPLORATORY LAPAROTOMY     open procedure  . EYE SURGERY Bilateral    cataract removal  . PORTACATH PLACEMENT Right 05/07/2017   Procedure: INSERTION PORT-A-CATH;  Surgeon: Erroll Luna, MD;  Location: [place];  Service: General;  Laterality: Right;  . SHOULDER SURGERY Right   . SIMPLE MASTECTOMY WITH AXILLARY SENTINEL NODE BIOPSY Right 05/07/2017   Procedure: RIGHT SIMPLE MASTECTOMY WITH AXILLARY SENTINEL NODE BIOPSY;  Surgeon: Erroll Luna, MD;  Location: [place];  Service: General;  Laterality: Right;  . TOTAL KNEE ARTHROPLASTY Right 01/26/2013   DR Ronnie Derby  . TOTAL KNEE ARTHROPLASTY Right 01/26/2013   Procedure: TOTAL KNEE ARTHROPLASTY;  Surgeon: Vickey Huger, MD;  Location: [place];  Service: Orthopedics;  Laterality: Right;  . UTERINE FIBROID SURGERY  1967    There were no vitals filed for this visit.   Subjective Assessment - 09/30/17 1305    Subjective  Just back to prevent the lymphedema    Pertinent History  Right mastectomy and sentinel node biopsy (2/4 positive nodes) on 05/07/17. Radiation completed Rt side, Right rotator cuff repair 3/18. Lumbar discectomy 2001 and right TKR 2014., Lt  bad knee    Patient Stated Goals  prevent lymphedema and improve scar and shoulder    Currently in Pain?  No/denies         [facility] PT Assessment - 09/30/17 0001      Assessment   Medical Diagnosis  s/p right mastectomy and SLNB    Referring Provider  Dr. Sarajane Jews Magrinat    Onset Date/Surgical Date  05/07/17    Hand Dominance  Right    Prior Therapy  yes stopped due to copay      Precautions   Precaution Comments  recent mastectomy; right arm lymphedema risk      Restrictions   Weight Bearing  Restrictions  No      Balance Screen   Has the patient fallen in the past 6 months  No    Has the patient had a decrease in activity level because of a fear of falling?   No    Is the patient reluctant to leave their home because of a fear of falling?   No      Home Film/video editor residence    Living Arrangements  Spouse/significant other    Available Help at Discharge  Family      Prior Function   Level of [place]  Retired    Leisure  has not been doing much lately       Cognition   Overall Cognitive Status  Within Functional Limits for tasks assessed      Observation/Other Assessments   Observations  Rt incision well healed with port in place, pulling into the chest with flexion due to scar tissue       Coordination   Gross Motor Movements are Fluid and Coordinated  Yes      Posture/Postural Control   Posture/Postural Control  Postural limitations    Postural Limitations  Rounded Shoulders;Forward head      ROM / Strength   AROM / PROM / Strength  Strength      AROM   Right Shoulder Extension  60 Degrees    Right Shoulder Flexion  135 Degrees   pull into chest   Right Shoulder ABduction  150 Degrees   pull into chest   Right Shoulder Internal Rotation  80 Degrees    Right Shoulder External Rotation  80 Degrees      Strength   Strength Assessment Site  Shoulder    Right/Left Shoulder  Right;Left    Right Shoulder Flexion  4/5    Right Shoulder Internal Rotation  4/5    Right Shoulder External Rotation  4-/5    Left Shoulder Flexion  4/5    Left Shoulder ABduction  4/5    Left Shoulder Internal Rotation  4/5    Left Shoulder External Rotation  4/5      6 minute walk test results    Endurance additional comments  bad Lt knee limits walking for exercise         LYMPHEDEMA/ONCOLOGY QUESTIONNAIRE - 09/30/17 1315      Type   Cancer Type  Right breast cancer      Surgeries   Mastectomy Date  05/07/17     Sentinel Lymph Node Biopsy Date  05/07/17    Number Lymph Nodes Removed  4      Treatment   Active Chemotherapy Treatment  Yes    Date  --   herceptin every 3 weeks  Past Chemotherapy Treatment  No    Active Radiation Treatment  No    Past Radiation Treatment  Yes    Current Hormone Treatment  Yes    Drug Name  Anastrozole      What other symptoms do you have   Are you Having Heaviness or Tightness  No    Are you having Pain  No    Are you having pitting edema  No    Is it Hard or Difficult finding clothes that fit  No      Right Upper Extremity Lymphedema   10 cm Proximal to Olecranon Process  34 cm    Olecranon Process  30 cm    10 cm Proximal to Ulnar Styloid Process  24.1 cm    Just Proximal to Ulnar Styloid Process  17.9 cm    Across Hand at [employer]  19.8 cm    At [place] of 2nd Digit  6.7 cm             Objective measurements completed on examination: See above findings.      [place] Adult PT Treatment/Exercise - 09/30/17 0001      Self-Care   Self-Care  Other Self-Care Comments    Other Self-Care Comments   education on livestrong, [employer] referrals, and aquatic classes,reviewed lymphedema precautions which she was aware of              PT Education - 09/30/17 1350    Education provided  Yes    Education Details  POC    Person(s) Educated  Patient    Methods  Explanation    Comprehension  Verbalized understanding          PT Long Term Goals - 09/30/17 1713      PT LONG TERM GOAL #1   Title  Pt will improve Rt shoulder flexion and abduction to at least 150degrees AROM with improvements of pulling into the incision by at least 50% subjectively     Time  3    Period  Weeks    Status  New    Target Date  10/21/17      PT LONG TERM GOAL #2   Title  Pt will be educated on self scar massage and myofascial release for the Rt chest wall     Time  3    Period  Weeks    Status  New    Target Date  10/21/17      PT LONG TERM GOAL #3    Title  Pt will be ind with final HEP for conitnued mobility and gym program     Time  3    Period  Weeks    Status  New    Target Date  10/21/17             Plan - 09/30/17 1348    Clinical Impression Statement  Bibi returns today after completing radiation still undergoing herceptin infusions every 3 weeks wanting to maximize her Rt shoulder ROM, decrease pulling into the chest with overhead reach, and learn what to do at the gym.  Her current deficits include limitations into overhead flexion, abduction with pull into the mastectomy incision, some weakness into ER of the shoulder, and sdecreased scar mobility.      History and Personal Factors relevant to plan of care:  Right mastectomy and sentinel node biopsy (2/4 positive nodes) on 05/07/17. Radiation completed Rt side, Right rotator cuff repair 3/18. Lumbar discectomy  2001 and right TKR 2014., Lt bad knee    Clinical Presentation  Stable    Clinical Decision Making  Moderate    Rehab Potential  Excellent    Clinical Impairments Affecting Rehab Potential  Previous rotator cuff repair on right side    PT Frequency  2x / week    PT Duration  3 weeks    PT Treatment/Interventions  ADLs/Self Care Home Management;Therapeutic exercise;Patient/family education;Manual techniques;Passive range of motion;Therapeutic activities    PT Next Visit Plan  myofascial release and PROM Rt shoulder into chest wall and mastectomy scar, ROM work (pt has HEP from previous sessions)    Recommended Other Services  L[employer]rong, [employer] trainer program, aquatics    Consulted and Agree with Plan of Care  Patient       Patient will benefit from skilled therapeutic intervention in order to improve the following deficits and impairments:  Pain, Decreased range of motion, Decreased knowledge of precautions, Impaired UE functional use, Postural dysfunction  Visit Diagnosis: Abnormal posture  Stiffness of right shoulder, not elsewhere classified  Aftercare  following surgery for neoplasm  Malignant neoplasm of central portion of right breast in female, estrogen receptor positive ([place])     Problem List Patient Active Problem List   Diagnosis Date Noted  . Port-A-Cath in place 07/24/2017  . Breast cancer, stage 2, right ([place]) 05/07/2017  . Malignant neoplasm of central portion of right breast in female, estrogen receptor positive ([place]) 04/02/2017  . Primary insomnia 09/27/2016  . ANA positive 09/27/2016  . Complete tear of right rotator cuff 05/11/2016  . High risk medication use 05/07/2016  . Uterine fibroid 12/08/2015  . History of miscarriage 12/08/2015  . Esophagitis 12/08/2015  . Diaphragmatic hernia 12/08/2015  . Calcium pyrophosphate deposition disease 12/08/2015  . Osteoarthritis of both hands 12/08/2015  . Osteoarthritis of both feet 12/08/2015  . History of total right knee replacement 12/08/2015  . Chondromalacia of both patellae 12/08/2015  . DDD (degenerative disc disease), lumbar 12/08/2015  . Essential hypertension 06/26/2013  . Paroxysmal atrial fibrillation ([place]) 06/26/2013  . Hyperlipidemia 06/26/2013  . DJD (degenerative joint disease) of knee 01/26/2013  . Hemorrhoids 07/11/2010  . IBS (irritable bowel syndrome) 07/11/2010  . Fatigue/loss of sleep 07/11/2010  . Night sweats 07/11/2010  . Chills 07/11/2010  . Weight gain 07/11/2010  . Inflammatory arthritis 07/11/2010  . Incontinence of urine 07/11/2010  . Thrombosed external hemorrhoids 07/11/2010    Shan Levans, PT 09/30/2017, 5:16 PM  [place], [place], [address] Phone: [phone]   Fax:  [phone]  Name: VALERA VALLAS MRN: [id number] Date of Birth: 10/31/1934

## 2017-10-04 ENCOUNTER — Telehealth: Payer: Self-pay

## 2017-10-04 NOTE — Telephone Encounter (Signed)
Pt called inquiring about status of FMLA papers for her daughter. Papers have been completed and are awaiting signature by GM.   Pt advised that GM out of the office until next Tuesday and that the nurse will call her once forms have been signed.   Pt verbalizes understanding.

## 2017-10-07 ENCOUNTER — Ambulatory Visit: Payer: Medicare Other

## 2017-10-07 DIAGNOSIS — R293 Abnormal posture: Secondary | ICD-10-CM | POA: Diagnosis not present

## 2017-10-07 DIAGNOSIS — Z170 Estrogen receptor positive status [ER+]: Secondary | ICD-10-CM

## 2017-10-07 DIAGNOSIS — M25611 Stiffness of right shoulder, not elsewhere classified: Secondary | ICD-10-CM

## 2017-10-07 DIAGNOSIS — C50111 Malignant neoplasm of central portion of right female breast: Secondary | ICD-10-CM

## 2017-10-07 DIAGNOSIS — Z483 Aftercare following surgery for neoplasm: Secondary | ICD-10-CM

## 2017-10-07 NOTE — Therapy (Signed)
[place], [place], [address] Phone: [phone]   Fax:  [phone]  Physical Therapy Treatment  Patient Details  Name: Tara Cox MRN: [id number] Date of Birth: 1934/01/14 Referring Provider (PT): Dr. Lurline Del   Encounter Date: 10/07/2017  PT End of Session - 10/07/17 1214    Visit Number  9    Number of Visits  14    Date for PT Re-Evaluation  10/21/17    PT Start Time  1104    PT Stop Time  1151    PT Time Calculation (min)  47 min    Activity Tolerance  Patient tolerated treatment well    Behavior During Therapy  [facility] for tasks assessed/performed       Past Medical History:  Diagnosis Date  . A-fib ([place])   . Arthritis   . 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 DDD (degenerative disc disease), lumbar 12/08/2015   S/P discectomy   . Diaphragmatic hernia 12/08/2015  . Dysrhythmia    A-Fib  . Edema of lower extremity 06/29/09   Lower Venous Exam- normal. No evidence of thrombus or thrombophlebitis.  . Esophagitis 12/08/2015  . Frequent urination at night   . GERD (gastroesophageal reflux disease)   . Hemorrhoids   . History of calcium pyrophosphate deposition disease (CPPD) 12/08/2015  . History of miscarriage 12/08/2015   x3  . History of total right knee replacement 12/08/2015  . Hypertension 02/24/09   Echo-EF 62%; Myocardial Perfusion Study-Normal. No signifcant ischemia noted.  . IBS (irritable bowel syndrome)   . Migraine   . Osteoarthritis of both feet 12/08/2015  . Osteoarthritis of both hands 12/08/2015  . PONV (postoperative nausea and vomiting)   . Uterine fibroid 12/08/2015    Past Surgical History:  Procedure Laterality Date  . ABDOMINAL HYSTERECTOMY  1974  . APPENDECTOMY    . BACK SURGERY  2001  . BUNIONECTOMY  1986  . CESAREAN SECTION  1971  . COLONOSCOPY    . COLONOSCOPY WITH PROPOFOL N/A 05/28/2014   Procedure: COLONOSCOPY WITH PROPOFOL;  Surgeon:  Carol Ada, MD;  [facility];  Service: Endoscopy;  Laterality: N/A;  . ESOPHAGOGASTRODUODENOSCOPY (EGD) WITH PROPOFOL N/A 05/28/2014   Procedure: ESOPHAGOGASTRODUODENOSCOPY (EGD) WITH PROPOFOL;  Surgeon: Carol Ada, MD;  [facility];  Service: Endoscopy;  Laterality: N/A;  . EXPLORATORY LAPAROTOMY     open procedure  . EYE SURGERY Bilateral    cataract removal  . PORTACATH PLACEMENT Right 05/07/2017   Procedure: INSERTION PORT-A-CATH;  Surgeon: Erroll Luna, MD;  Location: [place];  Service: General;  Laterality: Right;  . SHOULDER SURGERY Right   . SIMPLE MASTECTOMY WITH AXILLARY SENTINEL NODE BIOPSY Right 05/07/2017   Procedure: RIGHT SIMPLE MASTECTOMY WITH AXILLARY SENTINEL NODE BIOPSY;  Surgeon: Erroll Luna, MD;  Location: [place];  Service: General;  Laterality: Right;  . TOTAL KNEE ARTHROPLASTY Right 01/26/2013   DR Ronnie Derby  . TOTAL KNEE ARTHROPLASTY Right 01/26/2013   Procedure: TOTAL KNEE ARTHROPLASTY;  Surgeon: Vickey Huger, MD;  Location: [place];  Service: Orthopedics;  Laterality: Right;  . UTERINE FIBROID SURGERY  1967    There were no vitals filed for this visit.  Subjective Assessment - 10/07/17 1111    Subjective  My Rt chest wall feels tight and full this morning. And I might be flying to [place] for Thanksgiving so made need a sleeve??    Pertinent History  Right mastectomy and sentinel node biopsy (2/4 positive nodes) on  05/07/17. Radiation completed Rt side, Right rotator cuff repair 3/18. Lumbar discectomy 2001 and right TKR 2014., Lt bad knee    Patient Stated Goals  prevent lymphedema and improve scar and shoulder    Currently in Pain?  No/denies                       [facility] Adult PT Treatment/Exercise - 10/07/17 0001      Manual Therapy   Manual Therapy  Myofascial release;Manual Lymphatic Drainage (MLD);Passive ROM    Myofascial Release  To Rt chest wall on incision and superior to where tissue is the tightest, also at lateral  trunk where pt c/o of feeling of fullness inferior to incision.    Manual Lymphatic Drainage (MLD)  In Supine: Short neck, 5 diaphragmatic breaths, Rt inguinal nodes, Rt axillo-inguinal anastomosis, then focused on area inferior to incision where pt c/o fullness.    Passive ROM  In Supine to Rt shoulder into flexion, abduction and D2 to pts tolerance                  PT Long Term Goals - 09/30/17 1713      PT LONG TERM GOAL #1   Title  Pt will improve Rt shoulder flexion and abduction to at least 150degrees AROM with improvements of pulling into the incision by at least 50% subjectively     Time  3    Period  Weeks    Status  New    Target Date  10/21/17      PT LONG TERM GOAL #2   Title  Pt will be educated on self scar massage and myofascial release for the Rt chest wall     Time  3    Period  Weeks    Status  New    Target Date  10/21/17      PT LONG TERM GOAL #3   Title  Pt will be ind with final HEP for conitnued mobility and gym program     Time  3    Period  Weeks    Status  New    Target Date  10/21/17            Plan - 10/07/17 1215    Clinical Impression Statement  Focused on manual therapy to Rt chest wall, P/ROM to Rt shoulder and manual lymph drainage to Rt lateral trunk. Pt tolerated this well though did report some tenderness inferior to Rt incision where pt has palpable fullness.     Rehab Potential  Excellent    Clinical Impairments Affecting Rehab Potential  Previous rotator cuff repair on right side    PT Frequency  2x / week    PT Duration  3 weeks    PT Treatment/Interventions  ADLs/Self Care Home Management;Therapeutic exercise;Patient/family education;Manual techniques;Passive range of motion;Therapeutic activities    PT Next Visit Plan  Cont myofascial release and PROM Rt shoulder into chest wall and mastectomy scar, manual lymph drainage, ROM work (pt has HEP from previous sessions)    Consulted and Agree with Plan of Care  Patient        Patient will benefit from skilled therapeutic intervention in order to improve the following deficits and impairments:  Pain, Decreased range of motion, Decreased knowledge of precautions, Impaired UE functional use, Postural dysfunction  Visit Diagnosis: Abnormal posture  Stiffness of right shoulder, not elsewhere classified  Aftercare following surgery for neoplasm  Malignant neoplasm of central portion of  right breast in female, estrogen receptor positive ([place])     Problem List Patient Active Problem List   Diagnosis Date Noted  . Port-A-Cath in place 07/24/2017  . Breast cancer, stage 2, right ([place]) 05/07/2017  . Malignant neoplasm of central portion of right breast in female, estrogen receptor positive ([place]) 04/02/2017  . Primary insomnia 09/27/2016  . ANA positive 09/27/2016  . Complete tear of right rotator cuff 05/11/2016  . High risk medication use 05/07/2016  . Uterine fibroid 12/08/2015  . History of miscarriage 12/08/2015  . Esophagitis 12/08/2015  . Diaphragmatic hernia 12/08/2015  . Calcium pyrophosphate deposition disease 12/08/2015  . Osteoarthritis of both hands 12/08/2015  . Osteoarthritis of both feet 12/08/2015  . History of total right knee replacement 12/08/2015  . Chondromalacia of both patellae 12/08/2015  . DDD (degenerative disc disease), lumbar 12/08/2015  . Essential hypertension 06/26/2013  . Paroxysmal atrial fibrillation ([place]) 06/26/2013  . Hyperlipidemia 06/26/2013  . DJD (degenerative joint disease) of knee 01/26/2013  . Hemorrhoids 07/11/2010  . IBS (irritable bowel syndrome) 07/11/2010  . Fatigue/loss of sleep 07/11/2010  . Night sweats 07/11/2010  . Chills 07/11/2010  . Weight gain 07/11/2010  . Inflammatory arthritis 07/11/2010  . Incontinence of urine 07/11/2010  . Thrombosed external hemorrhoids 07/11/2010    Otelia Limes, PTA 10/07/2017, 12:24 PM  [place], [place], [address] Phone: [phone]   Fax:  [phone]  Name: Tara Cox MRN: [id number] Date of Birth: 24-Sep-1934

## 2017-10-07 NOTE — Progress Notes (Signed)
FMLA successfully faxed to [employer] at [phone]. Mailed copy to patient address on file.

## 2017-10-09 ENCOUNTER — Ambulatory Visit: Payer: Medicare Other | Attending: Surgery | Admitting: Physical Therapy

## 2017-10-09 ENCOUNTER — Encounter: Payer: Self-pay | Admitting: Physical Therapy

## 2017-10-09 DIAGNOSIS — Z483 Aftercare following surgery for neoplasm: Secondary | ICD-10-CM

## 2017-10-09 DIAGNOSIS — M25611 Stiffness of right shoulder, not elsewhere classified: Secondary | ICD-10-CM

## 2017-10-09 DIAGNOSIS — R293 Abnormal posture: Secondary | ICD-10-CM | POA: Insufficient documentation

## 2017-10-09 DIAGNOSIS — C50111 Malignant neoplasm of central portion of right female breast: Secondary | ICD-10-CM | POA: Diagnosis present

## 2017-10-09 DIAGNOSIS — Z170 Estrogen receptor positive status [ER+]: Secondary | ICD-10-CM | POA: Diagnosis present

## 2017-10-09 NOTE — Therapy (Signed)
[place], [place], [address] Phone: [phone]   Fax:  [phone]  Physical Therapy Treatment Progress Note Reporting Period 04/10/2017 to 10/09/2017  See note below for Objective Data and Assessment of Progress/Goals.      Patient Details  Name: Tara Cox MRN: [id number] Date of Birth: 09-Aug-1934 Referring Provider (PT): Dr. Lurline Del   Encounter Date: 10/09/2017  PT End of Session - 10/09/17 1433    Visit Number  10    Number of Visits  14    Date for PT Re-Evaluation  10/21/17    PT Start Time  0932    PT Stop Time  1430    PT Time Calculation (min)  45 min    Activity Tolerance  Patient tolerated treatment well;Treatment limited secondary to medical complications (Comment)       Past Medical History:  Diagnosis Date  . A-fib ([place])   . Arthritis   . 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 DDD (degenerative disc disease), lumbar 12/08/2015   S/P discectomy   . Diaphragmatic hernia 12/08/2015  . Dysrhythmia    A-Fib  . Edema of lower extremity 06/29/09   Lower Venous Exam- normal. No evidence of thrombus or thrombophlebitis.  . Esophagitis 12/08/2015  . Frequent urination at night   . GERD (gastroesophageal reflux disease)   . Hemorrhoids   . History of calcium pyrophosphate deposition disease (CPPD) 12/08/2015  . History of miscarriage 12/08/2015   x3  . History of total right knee replacement 12/08/2015  . Hypertension 02/24/09   Echo-EF 62%; Myocardial Perfusion Study-Normal. No signifcant ischemia noted.  . IBS (irritable bowel syndrome)   . Migraine   . Osteoarthritis of both feet 12/08/2015  . Osteoarthritis of both hands 12/08/2015  . PONV (postoperative nausea and vomiting)   . Uterine fibroid 12/08/2015    Past Surgical History:  Procedure Laterality Date  . ABDOMINAL HYSTERECTOMY  1974  . APPENDECTOMY    . BACK SURGERY  2001  . BUNIONECTOMY  1986  .  CESAREAN SECTION  1971  . COLONOSCOPY    . COLONOSCOPY WITH PROPOFOL N/A 05/28/2014   Procedure: COLONOSCOPY WITH PROPOFOL;  Surgeon: Carol Ada, MD;  [facility];  Service: Endoscopy;  Laterality: N/A;  . ESOPHAGOGASTRODUODENOSCOPY (EGD) WITH PROPOFOL N/A 05/28/2014   Procedure: ESOPHAGOGASTRODUODENOSCOPY (EGD) WITH PROPOFOL;  Surgeon: Carol Ada, MD;  [facility];  Service: Endoscopy;  Laterality: N/A;  . EXPLORATORY LAPAROTOMY     open procedure  . EYE SURGERY Bilateral    cataract removal  . PORTACATH PLACEMENT Right 05/07/2017   Procedure: INSERTION PORT-A-CATH;  Surgeon: Erroll Luna, MD;  Location: [place];  Service: General;  Laterality: Right;  . SHOULDER SURGERY Right   . SIMPLE MASTECTOMY WITH AXILLARY SENTINEL NODE BIOPSY Right 05/07/2017   Procedure: RIGHT SIMPLE MASTECTOMY WITH AXILLARY SENTINEL NODE BIOPSY;  Surgeon: Erroll Luna, MD;  Location: [place];  Service: General;  Laterality: Right;  . TOTAL KNEE ARTHROPLASTY Right 01/26/2013   DR Ronnie Derby  . TOTAL KNEE ARTHROPLASTY Right 01/26/2013   Procedure: TOTAL KNEE ARTHROPLASTY;  Surgeon: Vickey Huger, MD;  Location: [place];  Service: Orthopedics;  Laterality: Right;  . UTERINE FIBROID SURGERY  1967    There were no vitals filed for this visit.  Subjective Assessment - 10/09/17 1354    Subjective  Pt states she is not feeling so well today.  She is having some shortness of breath.  She says it feels a  little different thatn it usually was.  She is going to get her compression sleeve when she leaves her today     Pertinent History  Right mastectomy and sentinel node biopsy (2/4 positive nodes) on 05/07/17. Radiation completed Rt side, Right rotator cuff repair 3/18. Lumbar discectomy 2001 and right TKR 2014., Lt bad knee    Patient Stated Goals  prevent lymphedema and improve scar and shoulder    Currently in Pain?  No/denies                       [facility] Adult PT Treatment/Exercise -  10/09/17 0001      Exercises   Exercises  Shoulder;Lumbar      Shoulder Exercises: Sidelying   ABduction  AROM;Right;5 reps    Other Sidelying Exercises  horizontal abduction stretch       Manual Therapy   Myofascial Release  To Rt chest wall on incision and superior to where tissue is the tightest, also at lateral trunk where pt c/o of feeling of fullness inferior to incision.    Manual Lymphatic Drainage (MLD)  In Supine: Short neck, 5 diaphragmatic breaths, Rt inguinal nodes, Rt axillo-inguinal anastomosis, then focused on area inferior to incision where pt c/o fullness.then to sidelying for back and posterior interaxillary ansatsmosis     Passive ROM  In Supine to Rt shoulder into flexion, abduction and D2 to pts tolerance                  PT Long Term Goals - 09/30/17 1713      PT LONG TERM GOAL #1   Title  Pt will improve Rt shoulder flexion and abduction to at least 150degrees AROM with improvements of pulling into the incision by at least 50% subjectively     Time  3    Period  Weeks    Status  New    Target Date  10/21/17      PT LONG TERM GOAL #2   Title  Pt will be educated on self scar massage and myofascial release for the Rt chest wall     Time  3    Period  Weeks    Status  New    Target Date  10/21/17      PT LONG TERM GOAL #3   Title  Pt will be ind with final HEP for conitnued mobility and gym program     Time  3    Period  Weeks    Status  New    Target Date  10/21/17            Plan - 10/09/17 1435    Clinical Impression Statement  Pt said she felt better after she left treatment today.  She reports she will do her shoudler range of motion at home     Rehab Potential  Excellent    Clinical Impairments Affecting Rehab Potential  Previous rotator cuff repair on right side    PT Frequency  2x / week    PT Duration  3 weeks    PT Next Visit Plan  Cont myofascial release and PROM Rt shoulder into chest wall and mastectomy scar, manual  lymph drainage, ROM work (pt has HEP from previous sessions)    Consulted and Agree with Plan of Care  Patient       Patient will benefit from skilled therapeutic intervention in order to improve the following deficits and impairments:  Pain, Decreased range  of motion, Decreased knowledge of precautions, Impaired UE functional use, Postural dysfunction  Visit Diagnosis: Abnormal posture  Stiffness of right shoulder, not elsewhere classified  Aftercare following surgery for neoplasm     Problem List Patient Active Problem List   Diagnosis Date Noted  . Port-A-Cath in place 07/24/2017  . Breast cancer, stage 2, right ([place]) 05/07/2017  . Malignant neoplasm of central portion of right breast in female, estrogen receptor positive ([place]) 04/02/2017  . Primary insomnia 09/27/2016  . ANA positive 09/27/2016  . Complete tear of right rotator cuff 05/11/2016  . High risk medication use 05/07/2016  . Uterine fibroid 12/08/2015  . History of miscarriage 12/08/2015  . Esophagitis 12/08/2015  . Diaphragmatic hernia 12/08/2015  . Calcium pyrophosphate deposition disease 12/08/2015  . Osteoarthritis of both hands 12/08/2015  . Osteoarthritis of both feet 12/08/2015  . History of total right knee replacement 12/08/2015  . Chondromalacia of both patellae 12/08/2015  . DDD (degenerative disc disease), lumbar 12/08/2015  . Essential hypertension 06/26/2013  . Paroxysmal atrial fibrillation ([place]) 06/26/2013  . Hyperlipidemia 06/26/2013  . DJD (degenerative joint disease) of knee 01/26/2013  . Hemorrhoids 07/11/2010  . IBS (irritable bowel syndrome) 07/11/2010  . Fatigue/loss of sleep 07/11/2010  . Night sweats 07/11/2010  . Chills 07/11/2010  . Weight gain 07/11/2010  . Inflammatory arthritis 07/11/2010  . Incontinence of urine 07/11/2010  . Thrombosed external hemorrhoids 07/11/2010   Donato Heinz. Owens Shark PT  Norwood Levo 10/09/2017, 2:37 PM  [place], [place], [address] Phone: [phone]   Fax:  [phone]  Name: Tara Cox MRN: [id number] Date of Birth: [birth year]

## 2017-10-14 ENCOUNTER — Encounter: Payer: Self-pay | Admitting: Rehabilitation

## 2017-10-14 ENCOUNTER — Ambulatory Visit: Payer: Medicare Other | Admitting: Rehabilitation

## 2017-10-14 DIAGNOSIS — R293 Abnormal posture: Secondary | ICD-10-CM

## 2017-10-14 DIAGNOSIS — C50111 Malignant neoplasm of central portion of right female breast: Secondary | ICD-10-CM | POA: Diagnosis present

## 2017-10-14 DIAGNOSIS — Z170 Estrogen receptor positive status [ER+]: Secondary | ICD-10-CM

## 2017-10-14 DIAGNOSIS — Z483 Aftercare following surgery for neoplasm: Secondary | ICD-10-CM

## 2017-10-14 DIAGNOSIS — Z5112 Encounter for antineoplastic immunotherapy: Secondary | ICD-10-CM | POA: Insufficient documentation

## 2017-10-14 DIAGNOSIS — M25611 Stiffness of right shoulder, not elsewhere classified: Secondary | ICD-10-CM

## 2017-10-14 NOTE — Therapy (Signed)
[place], [place], [address] Phone: [phone]   Fax:  [phone]  Physical Therapy Treatment  Patient Details  Name: Tara Cox MRN: [id number] Date of Birth: 1934-12-22 Referring Provider (PT): Dr. Lurline Del   Encounter Date: 10/14/2017  PT End of Session - 10/14/17 0935    Visit Number  11    Number of Visits  14    Date for PT Re-Evaluation  10/21/17    PT Start Time  0931    PT Stop Time  1015    PT Time Calculation (min)  44 min    Activity Tolerance  Patient tolerated treatment well;Treatment limited secondary to medical complications (Comment)    Behavior During Therapy  [facility] for tasks assessed/performed       Past Medical History:  Diagnosis Date  . A-fib ([place])   . Arthritis   . 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 DDD (degenerative disc disease), lumbar 12/08/2015   S/P discectomy   . Diaphragmatic hernia 12/08/2015  . Dysrhythmia    A-Fib  . Edema of lower extremity 06/29/09   Lower Venous Exam- normal. No evidence of thrombus or thrombophlebitis.  . Esophagitis 12/08/2015  . Frequent urination at night   . GERD (gastroesophageal reflux disease)   . Hemorrhoids   . History of calcium pyrophosphate deposition disease (CPPD) 12/08/2015  . History of miscarriage 12/08/2015   x3  . History of total right knee replacement 12/08/2015  . Hypertension 02/24/09   Echo-EF 62%; Myocardial Perfusion Study-Normal. No signifcant ischemia noted.  . IBS (irritable bowel syndrome)   . Migraine   . Osteoarthritis of both feet 12/08/2015  . Osteoarthritis of both hands 12/08/2015  . PONV (postoperative nausea and vomiting)   . Uterine fibroid 12/08/2015    Past Surgical History:  Procedure Laterality Date  . ABDOMINAL HYSTERECTOMY  1974  . APPENDECTOMY    . BACK SURGERY  2001  . BUNIONECTOMY  1986  . CESAREAN SECTION  1971  . COLONOSCOPY    . COLONOSCOPY WITH PROPOFOL N/A  05/28/2014   Procedure: COLONOSCOPY WITH PROPOFOL;  Surgeon: Carol Ada, MD;  [facility];  Service: Endoscopy;  Laterality: N/A;  . ESOPHAGOGASTRODUODENOSCOPY (EGD) WITH PROPOFOL N/A 05/28/2014   Procedure: ESOPHAGOGASTRODUODENOSCOPY (EGD) WITH PROPOFOL;  Surgeon: Carol Ada, MD;  [facility];  Service: Endoscopy;  Laterality: N/A;  . EXPLORATORY LAPAROTOMY     open procedure  . EYE SURGERY Bilateral    cataract removal  . PORTACATH PLACEMENT Right 05/07/2017   Procedure: INSERTION PORT-A-CATH;  Surgeon: Erroll Luna, MD;  Location: [place];  Service: General;  Laterality: Right;  . SHOULDER SURGERY Right   . SIMPLE MASTECTOMY WITH AXILLARY SENTINEL NODE BIOPSY Right 05/07/2017   Procedure: RIGHT SIMPLE MASTECTOMY WITH AXILLARY SENTINEL NODE BIOPSY;  Surgeon: Erroll Luna, MD;  Location: [place];  Service: General;  Laterality: Right;  . TOTAL KNEE ARTHROPLASTY Right 01/26/2013   DR Ronnie Derby  . TOTAL KNEE ARTHROPLASTY Right 01/26/2013   Procedure: TOTAL KNEE ARTHROPLASTY;  Surgeon: Vickey Huger, MD;  Location: [place];  Service: Orthopedics;  Laterality: Right;  . UTERINE FIBROID SURGERY  1967    There were no vitals filed for this visit.  Subjective Assessment - 10/14/17 0930    Subjective  the SOB is better.  I felt really good after last time.      Pertinent History  Right mastectomy and sentinel node biopsy (2/4 positive nodes) on 05/07/17. Radiation completed Rt  side, Right rotator cuff repair 3/18. Lumbar discectomy 2001 and right TKR 2014., Lt bad knee    Patient Stated Goals  prevent lymphedema and improve scar and shoulder    Currently in Pain?  No/denies         [facility] PT Assessment - 10/14/17 0001      AROM   Right Shoulder Flexion  142 Degrees    Right Shoulder ABduction  157 Degrees                   [facility] Adult PT Treatment/Exercise - 10/14/17 0001      Manual Therapy   Myofascial Release  To Rt chest wall on incision and superior to  where tissue is the tightest, also at lateral trunk where pt c/o of feeling of fullness inferior to incision.    Manual Lymphatic Drainage (MLD)  In Supine: Short neck, 5 diaphragmatic breaths, Rt inguinal nodes, Rt axillo-inguinal anastomosis, then focused on area inferior to incision where pt c/o fullness.then to sidelying for back and posterior interaxillary ansatsmosis     Passive ROM  In Supine to Rt shoulder into flexion, abduction and D2 to pts tolerance                  PT Long Term Goals - 09/30/17 1713      PT LONG TERM GOAL #1   Title  Pt will improve Rt shoulder flexion and abduction to at least 150degrees AROM with improvements of pulling into the incision by at least 50% subjectively     Time  3    Period  Weeks    Status  New    Target Date  10/21/17      PT LONG TERM GOAL #2   Title  Pt will be educated on self scar massage and myofascial release for the Rt chest wall     Time  3    Period  Weeks    Status  New    Target Date  10/21/17      PT LONG TERM GOAL #3   Title  Pt will be ind with final HEP for conitnued mobility and gym program     Time  3    Period  Weeks    Status  New    Target Date  10/21/17            Plan - 10/14/17 1011    Clinical Impression Statement  Pt reporting she felt much better after last treatment for about 4 days.  Not feeling as full in the chest and axilla today.  Tolerated treatment well.  ROM into flexion and abduction has improved about 7 degrees.      PT Frequency  2x / week    PT Duration  3 weeks    PT Treatment/Interventions  ADLs/Self Care Home Management;Therapeutic exercise;Patient/family education;Manual techniques;Passive range of motion;Therapeutic activities    PT Next Visit Plan  Cont myofascial release and PROM Rt shoulder into chest wall and mastectomy scar, manual lymph drainage, ROM work (pt has HEP from previous sessions)    PT Home Exercise Plan  Post op shoulder ROM HEP, supine dowel stretches for  right shoulder    Consulted and Agree with Plan of Care  Patient       Patient will benefit from skilled therapeutic intervention in order to improve the following deficits and impairments:  Pain, Decreased range of motion, Decreased knowledge of precautions, Impaired UE functional use, Postural dysfunction  Visit  Diagnosis: Abnormal posture  Stiffness of right shoulder, not elsewhere classified  Aftercare following surgery for neoplasm  Malignant neoplasm of central portion of right breast in female, estrogen receptor positive [facility])     Problem List Patient Active Problem List   Diagnosis Date Noted  . Port-A-Cath in place 07/24/2017  . Breast cancer, stage 2, right ([place]) 05/07/2017  . Malignant neoplasm of central portion of right breast in female, estrogen receptor positive ([place]) 04/02/2017  . Primary insomnia 09/27/2016  . ANA positive 09/27/2016  . Complete tear of right rotator cuff 05/11/2016  . High risk medication use 05/07/2016  . Uterine fibroid 12/08/2015  . History of miscarriage 12/08/2015  . Esophagitis 12/08/2015  . Diaphragmatic hernia 12/08/2015  . Calcium pyrophosphate deposition disease 12/08/2015  . Osteoarthritis of both hands 12/08/2015  . Osteoarthritis of both feet 12/08/2015  . History of total right knee replacement 12/08/2015  . Chondromalacia of both patellae 12/08/2015  . DDD (degenerative disc disease), lumbar 12/08/2015  . Essential hypertension 06/26/2013  . Paroxysmal atrial fibrillation ([place]) 06/26/2013  . Hyperlipidemia 06/26/2013  . DJD (degenerative joint disease) of knee 01/26/2013  . Hemorrhoids 07/11/2010  . IBS (irritable bowel syndrome) 07/11/2010  . Fatigue/loss of sleep 07/11/2010  . Night sweats 07/11/2010  . Chills 07/11/2010  . Weight gain 07/11/2010  . Inflammatory arthritis 07/11/2010  . Incontinence of urine 07/11/2010  . Thrombosed external hemorrhoids 07/11/2010    Shan Levans, PT 10/14/2017, 10:15 AM  [place], [place], [address] Phone: [phone]   Fax:  [phone]  Name: GENEVRA ORNE MRN: [id number] Date of Birth: 02-28-1934

## 2017-10-15 ENCOUNTER — Inpatient Hospital Stay: Payer: Medicare Other | Attending: Oncology

## 2017-10-15 ENCOUNTER — Inpatient Hospital Stay: Payer: Medicare Other

## 2017-10-15 VITALS — BP 146/78 | HR 75 | Temp 97.9°F | Resp 18

## 2017-10-15 DIAGNOSIS — C50111 Malignant neoplasm of central portion of right female breast: Secondary | ICD-10-CM

## 2017-10-15 DIAGNOSIS — Z170 Estrogen receptor positive status [ER+]: Principal | ICD-10-CM

## 2017-10-15 DIAGNOSIS — C50911 Malignant neoplasm of unspecified site of right female breast: Secondary | ICD-10-CM

## 2017-10-15 DIAGNOSIS — Z95828 Presence of other vascular implants and grafts: Secondary | ICD-10-CM

## 2017-10-15 DIAGNOSIS — Z5112 Encounter for antineoplastic immunotherapy: Secondary | ICD-10-CM | POA: Diagnosis not present

## 2017-10-15 LAB — COMPREHENSIVE METABOLIC PANEL
ALK PHOS: 85 U/L (ref 38–126)
ALT: 39 U/L (ref 0–44)
AST: 29 U/L (ref 15–41)
Albumin: 3.6 g/dL (ref 3.5–5.0)
Anion gap: 7 (ref 5–15)
BILIRUBIN TOTAL: 0.4 mg/dL (ref 0.3–1.2)
BUN: 16 mg/dL (ref 8–23)
CALCIUM: 9.5 mg/dL (ref 8.9–10.3)
CO2: 32 mmol/L (ref 22–32)
CREATININE: 0.89 mg/dL (ref 0.44–1.00)
Chloride: 102 mmol/L (ref 98–111)
GFR calc Af Amer: >60 mL/min (ref >60)
GFR calc non Af Amer: 59 mL/min — ABNORMAL LOW (ref >60)
GLUCOSE: 117 mg/dL — AB (ref 70–99)
Potassium: 3.9 mmol/L (ref 3.5–5.1)
Sodium: 141 mmol/L (ref 135–145)
TOTAL PROTEIN: 6.6 g/dL (ref 6.5–8.1)

## 2017-10-15 LAB — CBC WITH DIFFERENTIAL/PLATELET
Abs Immature Granulocytes: 0.01 10*3/uL (ref 0.00–0.07)
BASOS PCT: 1 %
Basophils Absolute: 0 10*3/uL (ref 0.0–0.1)
EOS ABS: 0.1 10*3/uL (ref 0.0–0.5)
EOS PCT: 3 %
HCT: 41.2 % (ref 36.0–46.0)
HEMOGLOBIN: 12.9 g/dL (ref 12.0–15.0)
Immature Granulocytes: 0 %
LYMPHS PCT: 31 %
Lymphs Abs: 1.3 10*3/uL (ref 0.7–4.0)
MCH: 26 pg (ref 26.0–34.0)
MCHC: 31.3 g/dL (ref 30.0–36.0)
MCV: 83.1 fL (ref 80.0–100.0)
MONO ABS: 0.4 10*3/uL (ref 0.1–1.0)
Monocytes Relative: 10 %
Neutro Abs: 2.2 10*3/uL (ref 1.7–7.7)
Neutrophils Relative %: 55 %
Platelets: 185 10*3/uL (ref 150–400)
RBC: 4.96 MIL/uL (ref 3.87–5.11)
RDW: 14.1 % (ref 11.5–15.5)
WBC: 4 10*3/uL (ref 4.0–10.5)

## 2017-10-15 MED ORDER — ACETAMINOPHEN 325 MG PO TABS
ORAL_TABLET | ORAL | Status: AC
  Filled 2017-10-15: qty 2

## 2017-10-15 MED ORDER — ACETAMINOPHEN 325 MG PO TABS
650.0000 mg | ORAL_TABLET | Freq: Once | ORAL | Status: AC
  Administered 2017-10-15: 650 mg via ORAL

## 2017-10-15 MED ORDER — SODIUM CHLORIDE 0.9% FLUSH
10.0000 mL | INTRAVENOUS | Status: DC | PRN
  Administered 2017-10-15: 10 mL
  Filled 2017-10-15: qty 10

## 2017-10-15 MED ORDER — HEPARIN SOD (PORK) LOCK FLUSH 100 UNIT/ML IV SOLN
500.0000 [IU] | Freq: Once | INTRAVENOUS | Status: AC | PRN
  Administered 2017-10-15: 500 [IU]
  Filled 2017-10-15: qty 5

## 2017-10-15 MED ORDER — DIPHENHYDRAMINE HCL 25 MG PO CAPS
ORAL_CAPSULE | ORAL | Status: AC
  Filled 2017-10-15: qty 1

## 2017-10-15 MED ORDER — DIPHENHYDRAMINE HCL 25 MG PO CAPS
25.0000 mg | ORAL_CAPSULE | Freq: Once | ORAL | Status: AC
  Administered 2017-10-15: 25 mg via ORAL

## 2017-10-15 MED ORDER — TRASTUZUMAB CHEMO 150 MG IV SOLR
6.0000 mg/kg | Freq: Once | INTRAVENOUS | Status: AC
  Administered 2017-10-15: 651 mg via INTRAVENOUS
  Filled 2017-10-15: qty 31

## 2017-10-15 MED ORDER — SODIUM CHLORIDE 0.9 % IV SOLN
Freq: Once | INTRAVENOUS | Status: AC
  Administered 2017-10-15: 12:00:00 via INTRAVENOUS
  Filled 2017-10-15: qty 250

## 2017-10-15 NOTE — Progress Notes (Signed)
Irritability during hot flashes ,lasting 30 minutes -happens 1,1/2 weeks after herceptin. She has insomnia . Lower spine pain is intermittent ,but more intense than it has been . IF she stands up and stretches it feels better, she does not need medication. She started back at the gym and rides the bike.

## 2017-10-15 NOTE — Progress Notes (Signed)
Pian better using heated recliner.

## 2017-10-15 NOTE — Patient Instructions (Signed)
[facility] Discharge Instructions for Patients Receiving Chemotherapy  Today you received the following chemotherapy agents Herceptin  To help prevent nausea and vomiting after your treatment, we encourage you to take your nausea medication as directed   If you develop nausea and vomiting that is not controlled by your nausea medication, call the clinic.   BELOW ARE SYMPTOMS THAT SHOULD BE REPORTED IMMEDIATELY:  *FEVER GREATER THAN 100.5 F  *CHILLS WITH OR WITHOUT FEVER  NAUSEA AND VOMITING THAT IS NOT CONTROLLED WITH YOUR NAUSEA MEDICATION  *UNUSUAL SHORTNESS OF BREATH  *UNUSUAL BRUISING OR BLEEDING  TENDERNESS IN MOUTH AND THROAT WITH OR WITHOUT PRESENCE OF ULCERS  *URINARY PROBLEMS  *BOWEL PROBLEMS  UNUSUAL RASH Items with * indicate a potential emergency and should be followed up as soon as possible.  Feel free to call the clinic should you have any questions or concerns. The clinic phone number is [phone].  Please show the CHEMO ALERT CARD at check-in to the Emergency Department and triage nurse.   

## 2017-10-16 ENCOUNTER — Encounter: Payer: Self-pay | Admitting: Rehabilitation

## 2017-10-16 ENCOUNTER — Ambulatory Visit: Payer: Medicare Other | Admitting: Rehabilitation

## 2017-10-16 DIAGNOSIS — Z170 Estrogen receptor positive status [ER+]: Secondary | ICD-10-CM

## 2017-10-16 DIAGNOSIS — C50111 Malignant neoplasm of central portion of right female breast: Secondary | ICD-10-CM

## 2017-10-16 DIAGNOSIS — R293 Abnormal posture: Secondary | ICD-10-CM

## 2017-10-16 DIAGNOSIS — Z483 Aftercare following surgery for neoplasm: Secondary | ICD-10-CM

## 2017-10-16 DIAGNOSIS — M25611 Stiffness of right shoulder, not elsewhere classified: Secondary | ICD-10-CM

## 2017-10-16 NOTE — Therapy (Signed)
[place], [place], [address] Phone: [phone]   Fax:  [phone]  Physical Therapy Treatment  Patient Details  Name: Tara Cox MRN: [id number] Date of Birth: 23-Jun-1934 Referring Provider (PT): Dr. Lurline Del   Encounter Date: 10/16/2017  PT End of Session - 10/16/17 0939    Visit Number  12    Number of Visits  14    Date for PT Re-Evaluation  10/21/17    PT Start Time  0930    PT Stop Time  1015    PT Time Calculation (min)  45 min    Activity Tolerance  Patient tolerated treatment well    Behavior During Therapy  [facility] for tasks assessed/performed       Past Medical History:  Diagnosis Date  . A-fib ([place])   . Arthritis   . 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 DDD (degenerative disc disease), lumbar 12/08/2015   S/P discectomy   . Diaphragmatic hernia 12/08/2015  . Dysrhythmia    A-Fib  . Edema of lower extremity 06/29/09   Lower Venous Exam- normal. No evidence of thrombus or thrombophlebitis.  . Esophagitis 12/08/2015  . Frequent urination at night   . GERD (gastroesophageal reflux disease)   . Hemorrhoids   . History of calcium pyrophosphate deposition disease (CPPD) 12/08/2015  . History of miscarriage 12/08/2015   x3  . History of total right knee replacement 12/08/2015  . Hypertension 02/24/09   Echo-EF 62%; Myocardial Perfusion Study-Normal. No signifcant ischemia noted.  . IBS (irritable bowel syndrome)   . Migraine   . Osteoarthritis of both feet 12/08/2015  . Osteoarthritis of both hands 12/08/2015  . PONV (postoperative nausea and vomiting)   . Uterine fibroid 12/08/2015    Past Surgical History:  Procedure Laterality Date  . ABDOMINAL HYSTERECTOMY  1974  . APPENDECTOMY    . BACK SURGERY  2001  . BUNIONECTOMY  1986  . CESAREAN SECTION  1971  . COLONOSCOPY    . COLONOSCOPY WITH PROPOFOL N/A 05/28/2014   Procedure: COLONOSCOPY WITH PROPOFOL;  Surgeon:  Carol Ada, MD;  [facility];  Service: Endoscopy;  Laterality: N/A;  . ESOPHAGOGASTRODUODENOSCOPY (EGD) WITH PROPOFOL N/A 05/28/2014   Procedure: ESOPHAGOGASTRODUODENOSCOPY (EGD) WITH PROPOFOL;  Surgeon: Carol Ada, MD;  [facility];  Service: Endoscopy;  Laterality: N/A;  . EXPLORATORY LAPAROTOMY     open procedure  . EYE SURGERY Bilateral    cataract removal  . PORTACATH PLACEMENT Right 05/07/2017   Procedure: INSERTION PORT-A-CATH;  Surgeon: Erroll Luna, MD;  Location: [place];  Service: General;  Laterality: Right;  . SHOULDER SURGERY Right   . SIMPLE MASTECTOMY WITH AXILLARY SENTINEL NODE BIOPSY Right 05/07/2017   Procedure: RIGHT SIMPLE MASTECTOMY WITH AXILLARY SENTINEL NODE BIOPSY;  Surgeon: Erroll Luna, MD;  Location: [place];  Service: General;  Laterality: Right;  . TOTAL KNEE ARTHROPLASTY Right 01/26/2013   DR Ronnie Derby  . TOTAL KNEE ARTHROPLASTY Right 01/26/2013   Procedure: TOTAL KNEE ARTHROPLASTY;  Surgeon: Vickey Huger, MD;  Location: [place];  Service: Orthopedics;  Laterality: Right;  . UTERINE FIBROID SURGERY  1967    There were no vitals filed for this visit.  Subjective Assessment - 10/16/17 0936    Pertinent History  Right mastectomy and sentinel node biopsy (2/4 positive nodes) on 05/07/17. Radiation completed Rt side, Right rotator cuff repair 3/18. Lumbar discectomy 2001 and right TKR 2014., Lt bad knee    Patient Stated Goals  prevent lymphedema  and improve scar and shoulder    Currently in Pain?  No/denies         [facility] PT Assessment - 10/16/17 0001      AROM   Right Shoulder Flexion  148 Degrees    Right Shoulder ABduction  162 Degrees    Right Shoulder Internal Rotation  --   behind the back WNL   Right Shoulder External Rotation  --   behind the back WNL                  [facility] Adult PT Treatment/Exercise - 10/16/17 0001      Exercises   Exercises  Other Exercises    Other Exercises   showed pt nustep for possible  use by her at the [facility]. Went over using just the legs or just letting the arms sit onthe bars and to monitor effects of swelling if she does.        Manual Therapy   Myofascial Release  To Rt chest wall on incision and superior to where tissue is the tightest, also at lateral trunk where pt c/o of feeling of fullness inferior to incision.   avoiding port    Manual Lymphatic Drainage (MLD)  In supine and Lt sidelying from chest/axilla to inguinal region and posterior interaxillary pathway    Passive ROM  In Supine to Rt shoulder into flexion, abduction and D2 to pts tolerance                  PT Long Term Goals - 09/30/17 1713      PT LONG TERM GOAL #1   Title  Pt will improve Rt shoulder flexion and abduction to at least 150degrees AROM with improvements of pulling into the incision by at least 50% subjectively     Time  3    Period  Weeks    Status  New    Target Date  10/21/17      PT LONG TERM GOAL #2   Title  Pt will be educated on self scar massage and myofascial release for the Rt chest wall     Time  3    Period  Weeks    Status  New    Target Date  10/21/17      PT LONG TERM GOAL #3   Title  Pt will be ind with final HEP for conitnued mobility and gym program     Time  3    Period  Weeks    Status  New    Target Date  10/21/17            Plan - 10/16/17 1017    Clinical Impression Statement  Talina is doing very well overall.  her AROM continues to improve, no sig swelling in the chest or axilla, and starting to be ind at the gym.  She would like to complete the next week and then be done.      PT Frequency  2x / week    PT Duration  3 weeks    PT Treatment/Interventions  ADLs/Self Care Home Management;Therapeutic exercise;Patient/family education;Manual techniques;Passive range of motion;Therapeutic activities    PT Next Visit Plan  Cont myofascial release and PROM Rt shoulder into chest wall and mastectomy scar, manual lymph drainage, ROM work (pt has  HEP from previous sessions)    PT Home Exercise Plan  Post op shoulder ROM HEP, supine dowel stretches for right shoulder    Consulted and Agree with  Plan of Care  Patient       Patient will benefit from skilled therapeutic intervention in order to improve the following deficits and impairments:  Pain, Decreased range of motion, Decreased knowledge of precautions, Impaired UE functional use, Postural dysfunction  Visit Diagnosis: Abnormal posture  Stiffness of right shoulder, not elsewhere classified  Aftercare following surgery for neoplasm  Malignant neoplasm of central portion of right breast in female, estrogen receptor positive [facility])     Problem List Patient Active Problem List   Diagnosis Date Noted  . Port-A-Cath in place 07/24/2017  . Breast cancer, stage 2, right ([place]) 05/07/2017  . Malignant neoplasm of central portion of right breast in female, estrogen receptor positive ([place]) 04/02/2017  . Primary insomnia 09/27/2016  . ANA positive 09/27/2016  . Complete tear of right rotator cuff 05/11/2016  . High risk medication use 05/07/2016  . Uterine fibroid 12/08/2015  . History of miscarriage 12/08/2015  . Esophagitis 12/08/2015  . Diaphragmatic hernia 12/08/2015  . Calcium pyrophosphate deposition disease 12/08/2015  . Osteoarthritis of both hands 12/08/2015  . Osteoarthritis of both feet 12/08/2015  . History of total right knee replacement 12/08/2015  . Chondromalacia of both patellae 12/08/2015  . DDD (degenerative disc disease), lumbar 12/08/2015  . Essential hypertension 06/26/2013  . Paroxysmal atrial fibrillation ([place]) 06/26/2013  . Hyperlipidemia 06/26/2013  . DJD (degenerative joint disease) of knee 01/26/2013  . Hemorrhoids 07/11/2010  . IBS (irritable bowel syndrome) 07/11/2010  . Fatigue/loss of sleep 07/11/2010  . Night sweats 07/11/2010  . Chills 07/11/2010  . Weight gain 07/11/2010  . Inflammatory arthritis 07/11/2010  . Incontinence of urine  07/11/2010  . Thrombosed external hemorrhoids 07/11/2010    Shan Levans, PT 10/16/2017, 10:20 AM  [place], [place], [address] Phone: [phone]   Fax:  [phone]  Name: PASCHA FOGAL MRN: [id number] Date of Birth: [birth year]

## 2017-10-21 ENCOUNTER — Ambulatory Visit: Payer: Medicare Other

## 2017-10-21 DIAGNOSIS — R293 Abnormal posture: Secondary | ICD-10-CM

## 2017-10-21 DIAGNOSIS — M25611 Stiffness of right shoulder, not elsewhere classified: Secondary | ICD-10-CM

## 2017-10-21 DIAGNOSIS — C50111 Malignant neoplasm of central portion of right female breast: Secondary | ICD-10-CM

## 2017-10-21 DIAGNOSIS — Z483 Aftercare following surgery for neoplasm: Secondary | ICD-10-CM

## 2017-10-21 DIAGNOSIS — Z170 Estrogen receptor positive status [ER+]: Secondary | ICD-10-CM

## 2017-10-21 NOTE — Therapy (Signed)
[place], [place], [address] Phone: [phone]   Fax:  [phone]  Physical Therapy Treatment  Patient Details  Name: Tara Cox MRN: [id number] Date of Birth: [birth year] Referring Provider (PT): Dr. Lurline Del   Encounter Date: 10/21/2017  PT End of Session - 10/21/17 1017    Visit Number  13    Number of Visits  14    Date for PT Re-Evaluation  10/21/17    PT Start Time  0936    PT Stop Time  1017    PT Time Calculation (min)  41 min    Activity Tolerance  Patient tolerated treatment well    Behavior During Therapy  [facility] for tasks assessed/performed       Past Medical History:  Diagnosis Date  . A-fib ([place])   . Arthritis   . 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 DDD (degenerative disc disease), lumbar 12/08/2015   S/P discectomy   . Diaphragmatic hernia 12/08/2015  . Dysrhythmia    A-Fib  . Edema of lower extremity 06/29/09   Lower Venous Exam- normal. No evidence of thrombus or thrombophlebitis.  . Esophagitis 12/08/2015  . Frequent urination at night   . GERD (gastroesophageal reflux disease)   . Hemorrhoids   . History of calcium pyrophosphate deposition disease (CPPD) 12/08/2015  . History of miscarriage 12/08/2015   x3  . History of total right knee replacement 12/08/2015  . Hypertension 02/24/09   Echo-EF 62%; Myocardial Perfusion Study-Normal. No signifcant ischemia noted.  . IBS (irritable bowel syndrome)   . Migraine   . Osteoarthritis of both feet 12/08/2015  . Osteoarthritis of both hands 12/08/2015  . PONV (postoperative nausea and vomiting)   . Uterine fibroid 12/08/2015    Past Surgical History:  Procedure Laterality Date  . ABDOMINAL HYSTERECTOMY  1974  . APPENDECTOMY    . BACK SURGERY  2001  . BUNIONECTOMY  1986  . CESAREAN SECTION  1971  . COLONOSCOPY    . COLONOSCOPY WITH PROPOFOL N/A 05/28/2014   Procedure: COLONOSCOPY WITH PROPOFOL;  Surgeon:  Carol Ada, MD;  [facility];  Service: Endoscopy;  Laterality: N/A;  . ESOPHAGOGASTRODUODENOSCOPY (EGD) WITH PROPOFOL N/A 05/28/2014   Procedure: ESOPHAGOGASTRODUODENOSCOPY (EGD) WITH PROPOFOL;  Surgeon: Carol Ada, MD;  [facility];  Service: Endoscopy;  Laterality: N/A;  . EXPLORATORY LAPAROTOMY     open procedure  . EYE SURGERY Bilateral    cataract removal  . PORTACATH PLACEMENT Right 05/07/2017   Procedure: INSERTION PORT-A-CATH;  Surgeon: Erroll Luna, MD;  Location: [place];  Service: General;  Laterality: Right;  . SHOULDER SURGERY Right   . SIMPLE MASTECTOMY WITH AXILLARY SENTINEL NODE BIOPSY Right 05/07/2017   Procedure: RIGHT SIMPLE MASTECTOMY WITH AXILLARY SENTINEL NODE BIOPSY;  Surgeon: Erroll Luna, MD;  Location: [place];  Service: General;  Laterality: Right;  . TOTAL KNEE ARTHROPLASTY Right 01/26/2013   DR Ronnie Derby  . TOTAL KNEE ARTHROPLASTY Right 01/26/2013   Procedure: TOTAL KNEE ARTHROPLASTY;  Surgeon: Vickey Huger, MD;  Location: [place];  Service: Orthopedics;  Laterality: Right;  . UTERINE FIBROID SURGERY  1967    There were no vitals filed for this visit.  Subjective Assessment - 10/21/17 0939    Subjective  I signed up for [employer] but it doesn't start up until early spring. But I've been and will continue to workout some at the [facility] until then. My Rt shoulder is doing well and I'm going to make this my  last week .    Pertinent History  Right mastectomy and sentinel node biopsy (2/4 positive nodes) on 05/07/17. Radiation completed Rt side, Right rotator cuff repair 3/18. Lumbar discectomy 2001 and right TKR 2014., Lt bad knee    Patient Stated Goals  prevent lymphedema and improve scar and shoulder    Currently in Pain?  No/denies                       [facility] Adult PT Treatment/Exercise - 10/21/17 0001      Manual Therapy   Myofascial Release  To Rt chest wall on incision and superior to where tissue is the tightest, also at  lateral trunk where pt c/o of feeling of fullness inferior to incision.   avoiding port   Manual Lymphatic Drainage (MLD)  In supine: Short neck, 5 diaphragmatic breaths, Rt axilla and Lt inguinal nodes, Rt axillo-inguinal and anterior anastomosis then focusing on swelling at chest wall.    Passive ROM  In Supine to Rt shoulder into flexion, abduction and D2 to pts tolerance                  PT Long Term Goals - 09/30/17 1713      PT LONG TERM GOAL #1   Title  Pt will improve Rt shoulder flexion and abduction to at least 150degrees AROM with improvements of pulling into the incision by at least 50% subjectively     Time  3    Period  Weeks    Status  New    Target Date  10/21/17      PT LONG TERM GOAL #2   Title  Pt will be educated on self scar massage and myofascial release for the Rt chest wall     Time  3    Period  Weeks    Status  New    Target Date  10/21/17      PT LONG TERM GOAL #3   Title  Pt will be ind with final HEP for conitnued mobility and gym program     Time  3    Period  Weeks    Status  New    Target Date  10/21/17            Plan - 10/21/17 1017    Clinical Impression Statement  Focused on end P/ROM and manual lymph drainage to Rt chest wall. She has progressed very well since completing radiation and plans to D/C at next visit.     Rehab Potential  Excellent    Clinical Impairments Affecting Rehab Potential  Previous rotator cuff repair on right side    PT Frequency  2x / week    PT Duration  3 weeks    PT Treatment/Interventions  ADLs/Self Care Home Management;Therapeutic exercise;Patient/family education;Manual techniques;Passive range of motion;Therapeutic activities    PT Next Visit Plan  D/C next visit.    Consulted and Agree with Plan of Care  Patient       Patient will benefit from skilled therapeutic intervention in order to improve the following deficits and impairments:  Pain, Decreased range of motion, Decreased knowledge of  precautions, Impaired UE functional use, Postural dysfunction  Visit Diagnosis: Abnormal posture  Stiffness of right shoulder, not elsewhere classified  Aftercare following surgery for neoplasm  Malignant neoplasm of central portion of right breast in female, estrogen receptor positive [facility])     Problem List Patient Active Problem List   Diagnosis  Date Noted  . Port-A-Cath in place 07/24/2017  . Breast cancer, stage 2, right ([place]) 05/07/2017  . Malignant neoplasm of central portion of right breast in female, estrogen receptor positive ([place]) 04/02/2017  . Primary insomnia 09/27/2016  . ANA positive 09/27/2016  . Complete tear of right rotator cuff 05/11/2016  . High risk medication use 05/07/2016  . Uterine fibroid 12/08/2015  . History of miscarriage 12/08/2015  . Esophagitis 12/08/2015  . Diaphragmatic hernia 12/08/2015  . Calcium pyrophosphate deposition disease 12/08/2015  . Osteoarthritis of both hands 12/08/2015  . Osteoarthritis of both feet 12/08/2015  . History of total right knee replacement 12/08/2015  . Chondromalacia of both patellae 12/08/2015  . DDD (degenerative disc disease), lumbar 12/08/2015  . Essential hypertension 06/26/2013  . Paroxysmal atrial fibrillation ([place]) 06/26/2013  . Hyperlipidemia 06/26/2013  . DJD (degenerative joint disease) of knee 01/26/2013  . Hemorrhoids 07/11/2010  . IBS (irritable bowel syndrome) 07/11/2010  . Fatigue/loss of sleep 07/11/2010  . Night sweats 07/11/2010  . Chills 07/11/2010  . Weight gain 07/11/2010  . Inflammatory arthritis 07/11/2010  . Incontinence of urine 07/11/2010  . Thrombosed external hemorrhoids 07/11/2010    Otelia Limes, PTA 10/21/2017, 10:23 AM  [place], [place], [address] Phone: [phone]   Fax:  [phone]  Name: SHERA LAUBACH MRN: [id number] Date of Birth: Sep 26, 1934

## 2017-10-23 ENCOUNTER — Ambulatory Visit: Payer: Medicare Other | Admitting: Rehabilitation

## 2017-10-23 ENCOUNTER — Encounter: Payer: Self-pay | Admitting: Rehabilitation

## 2017-10-23 DIAGNOSIS — M25611 Stiffness of right shoulder, not elsewhere classified: Secondary | ICD-10-CM

## 2017-10-23 DIAGNOSIS — R293 Abnormal posture: Secondary | ICD-10-CM | POA: Diagnosis not present

## 2017-10-23 DIAGNOSIS — C50111 Malignant neoplasm of central portion of right female breast: Secondary | ICD-10-CM

## 2017-10-23 DIAGNOSIS — Z170 Estrogen receptor positive status [ER+]: Secondary | ICD-10-CM

## 2017-10-23 DIAGNOSIS — Z483 Aftercare following surgery for neoplasm: Secondary | ICD-10-CM

## 2017-10-23 NOTE — Patient Instructions (Signed)
Strength ABC program and post op stretches handouts

## 2017-10-23 NOTE — Therapy (Signed)
[place], [place], [address] Phone: [phone]   Fax:  [phone]  Physical Therapy Treatment  Patient Details  Name: Tara Cox MRN: [id number] Date of Birth: 06-21-1934 Referring Provider (PT): Dr. Lurline Del   Encounter Date: 10/23/2017  PT End of Session - 10/23/17 0941    Visit Number  14    Number of Visits  14    Date for PT Re-Evaluation  10/21/17    PT Start Time  0930    PT Stop Time  1015    PT Time Calculation (min)  45 min    Activity Tolerance  Patient tolerated treatment well    Behavior During Therapy  [facility] for tasks assessed/performed       Past Medical History:  Diagnosis Date  . A-fib ([place])   . Arthritis   . 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 DDD (degenerative disc disease), lumbar 12/08/2015   S/P discectomy   . Diaphragmatic hernia 12/08/2015  . Dysrhythmia    A-Fib  . Edema of lower extremity 06/29/09   Lower Venous Exam- normal. No evidence of thrombus or thrombophlebitis.  . Esophagitis 12/08/2015  . Frequent urination at night   . GERD (gastroesophageal reflux disease)   . Hemorrhoids   . History of calcium pyrophosphate deposition disease (CPPD) 12/08/2015  . History of miscarriage 12/08/2015   x3  . History of total right knee replacement 12/08/2015  . Hypertension 02/24/09   Echo-EF 62%; Myocardial Perfusion Study-Normal. No signifcant ischemia noted.  . IBS (irritable bowel syndrome)   . Migraine   . Osteoarthritis of both feet 12/08/2015  . Osteoarthritis of both hands 12/08/2015  . PONV (postoperative nausea and vomiting)   . Uterine fibroid 12/08/2015    Past Surgical History:  Procedure Laterality Date  . ABDOMINAL HYSTERECTOMY  1974  . APPENDECTOMY    . BACK SURGERY  2001  . BUNIONECTOMY  1986  . CESAREAN SECTION  1971  . COLONOSCOPY    . COLONOSCOPY WITH PROPOFOL N/A 05/28/2014   Procedure: COLONOSCOPY WITH PROPOFOL;  Surgeon:  Carol Ada, MD;  [facility];  Service: Endoscopy;  Laterality: N/A;  . ESOPHAGOGASTRODUODENOSCOPY (EGD) WITH PROPOFOL N/A 05/28/2014   Procedure: ESOPHAGOGASTRODUODENOSCOPY (EGD) WITH PROPOFOL;  Surgeon: Carol Ada, MD;  [facility];  Service: Endoscopy;  Laterality: N/A;  . EXPLORATORY LAPAROTOMY     open procedure  . EYE SURGERY Bilateral    cataract removal  . PORTACATH PLACEMENT Right 05/07/2017   Procedure: INSERTION PORT-A-CATH;  Surgeon: Erroll Luna, MD;  Location: [place];  Service: General;  Laterality: Right;  . SHOULDER SURGERY Right   . SIMPLE MASTECTOMY WITH AXILLARY SENTINEL NODE BIOPSY Right 05/07/2017   Procedure: RIGHT SIMPLE MASTECTOMY WITH AXILLARY SENTINEL NODE BIOPSY;  Surgeon: Erroll Luna, MD;  Location: [place];  Service: General;  Laterality: Right;  . TOTAL KNEE ARTHROPLASTY Right 01/26/2013   DR Ronnie Derby  . TOTAL KNEE ARTHROPLASTY Right 01/26/2013   Procedure: TOTAL KNEE ARTHROPLASTY;  Surgeon: Vickey Huger, MD;  Location: [place];  Service: Orthopedics;  Laterality: Right;  . UTERINE FIBROID SURGERY  1967    There were no vitals filed for this visit.  Subjective Assessment - 10/23/17 0934    Subjective  I feel ready to be done      Pertinent History  Right mastectomy and sentinel node biopsy (2/4 positive nodes) on 05/07/17. Radiation completed Rt side, Right rotator cuff repair 3/18. Lumbar discectomy 2001 and right TKR  2014., Lt bad knee    Patient Stated Goals  prevent lymphedema and improve scar and shoulder    Currently in Pain?  No/denies                       [facility] Adult PT Treatment/Exercise - 10/23/17 0001      Exercises   Other Exercises   Educated pt on strength ABC program with performance of all exercises.  Eliminated exercises that would hurt the patient's knee and or back per her preferences.  Discussed how to progress weights and reps              PT Education - 10/23/17 1024    Education provided   Yes    Education Details  final HEP    Person(s) Educated  Patient    Methods  Explanation;Demonstration;Tactile cues;Verbal cues;Handout    Comprehension  Verbalized understanding;Returned demonstration          PT Long Term Goals - 10/23/17 1026      PT LONG TERM GOAL #1   Title  Pt will improve Rt shoulder flexion and abduction to at least 150degrees AROM with improvements of pulling into the incision by at least 50% subjectively     Status  Achieved      PT LONG TERM GOAL #2   Title  Pt will be educated on self scar massage and myofascial release for the Rt chest wall     Status  Achieved      PT LONG TERM GOAL #3   Title  Pt will be ind with final HEP for conitnued mobility and gym program             Plan - 10/23/17 1025    Clinical Impression Statement  Tara Cox is ready for DC today with final HEP and self care instructions for the community and home.  Pt has met all goals.      Clinical Impairments Affecting Rehab Potential  Previous rotator cuff repair on right side    PT Frequency  2x / week    PT Duration  3 weeks    PT Treatment/Interventions  ADLs/Self Care Home Management    Consulted and Agree with Plan of Care  Patient       Patient will benefit from skilled therapeutic intervention in order to improve the following deficits and impairments:  Pain, Decreased range of motion, Decreased knowledge of precautions, Impaired UE functional use, Postural dysfunction  Visit Diagnosis: Abnormal posture  Stiffness of right shoulder, not elsewhere classified  Aftercare following surgery for neoplasm  Malignant neoplasm of central portion of right breast in female, estrogen receptor positive ([place])     Problem List Patient Active Problem List   Diagnosis Date Noted  . Port-A-Cath in place 07/24/2017  . Breast cancer, stage 2, right ([place]) 05/07/2017  . Malignant neoplasm of central portion of right breast in female, estrogen receptor positive ([place])  04/02/2017  . Primary insomnia 09/27/2016  . ANA positive 09/27/2016  . Complete tear of right rotator cuff 05/11/2016  . High risk medication use 05/07/2016  . Uterine fibroid 12/08/2015  . History of miscarriage 12/08/2015  . Esophagitis 12/08/2015  . Diaphragmatic hernia 12/08/2015  . Calcium pyrophosphate deposition disease 12/08/2015  . Osteoarthritis of both hands 12/08/2015  . Osteoarthritis of both feet 12/08/2015  . History of total right knee replacement 12/08/2015  . Chondromalacia of both patellae 12/08/2015  . DDD (degenerative disc disease), lumbar 12/08/2015  .  Essential hypertension 06/26/2013  . Paroxysmal atrial fibrillation ([place]) 06/26/2013  . Hyperlipidemia 06/26/2013  . DJD (degenerative joint disease) of knee 01/26/2013  . Hemorrhoids 07/11/2010  . IBS (irritable bowel syndrome) 07/11/2010  . Fatigue/loss of sleep 07/11/2010  . Night sweats 07/11/2010  . Chills 07/11/2010  . Weight gain 07/11/2010  . Inflammatory arthritis 07/11/2010  . Incontinence of urine 07/11/2010  . Thrombosed external hemorrhoids 07/11/2010    Shan Levans, PT 10/23/2017, 10:52 AM  [place], [place], [address] Phone: [phone]   Fax:  [phone]  Name: Tara Cox MRN: [id number] Date of Birth: November 07, 1934  PHYSICAL THERAPY DISCHARGE SUMMARY  Visits from Start of Care: 14  Current functional level related to goals / functional outcomes: Ready for DC with all goals met   Remaining deficits: Need for continued ROM and strengthening    Education / Equipment: Given final HEP Plan: Patient agrees to discharge.  Patient goals were met. Patient is being discharged due to meeting the stated rehab goals.  ?????     Shan Levans, PT

## 2017-10-29 ENCOUNTER — Other Ambulatory Visit: Payer: Medicare Other

## 2017-10-31 ENCOUNTER — Ambulatory Visit
Admission: RE | Admit: 2017-10-31 | Discharge: 2017-10-31 | Disposition: A | Discharge status: Home / Self Care | Payer: Medicare Other | Source: Ambulatory Visit | Attending: Oncology | Admitting: Oncology

## 2017-10-31 DIAGNOSIS — C50111 Malignant neoplasm of central portion of right female breast: Secondary | ICD-10-CM

## 2017-10-31 DIAGNOSIS — Z170 Estrogen receptor positive status [ER+]: Principal | ICD-10-CM

## 2017-11-01 NOTE — Progress Notes (Signed)
Office Visit Note  Patient: Tara Cox             Date of Birth: 07/09/1934           MRN: [id number]             PCP: [name], MD Referring: [name], MD Visit Date: 11/13/2017 Occupation: @GUAROCC @  Subjective:  Pain in lower back and hands.  History of Present Illness: Tara Cox is a 83 y.o. female with history of seronegative rheumatoid arthritis, pseudogout, osteoarthritis and degenerative disc disease.  She states she continues to have some discomfort in her lower back.  She has not noticed any joint swelling on Plaquenil and colchicine combination.  She was diagnosed with breast cancer and she is currently on chemotherapy.  She been getting Visco supplement injection to her left knee joint by her orthopedic surgeon.  Activities of Daily Living:  Patient reports morning stiffness for 0 minute.   Patient Denies nocturnal pain.  Difficulty dressing/grooming: Denies Difficulty climbing stairs: Denies Difficulty getting out of chair: Reports Difficulty using hands for taps, buttons, cutlery, and/or writing: Denies  Review of Systems  Constitutional: Negative for fatigue, night sweats, weight gain and weight loss.  HENT: Positive for mouth dryness. Negative for mouth sores, trouble swallowing, trouble swallowing and nose dryness.   Eyes: Negative for pain, redness, visual disturbance and dryness.  Respiratory: Negative for cough, shortness of breath and difficulty breathing.   Cardiovascular: Positive for palpitations and hypertension. Negative for chest pain, irregular heartbeat and swelling in legs/feet.       H/o At Fib  Gastrointestinal: Negative for blood in stool, constipation and diarrhea.  Endocrine: Negative for increased urination.  Genitourinary: Negative for vaginal dryness.  Musculoskeletal: Positive for arthralgias and joint pain. Negative for joint swelling, myalgias, muscle weakness, morning stiffness, muscle tenderness and myalgias.  Skin:  Negative for color change, rash, hair loss, skin tightness, ulcers and sensitivity to sunlight.  Allergic/Immunologic: Negative for susceptible to infections.  Neurological: Negative for dizziness, memory loss, night sweats and weakness.  Hematological: Negative for swollen glands.  Psychiatric/Behavioral: Negative for depressed mood and sleep disturbance. The patient is not nervous/anxious.     PMFS History:  Patient Active Problem List   Diagnosis Date Noted  . Rheumatoid arthritis of multiple sites with negative rheumatoid factor ([place]) 11/13/2017  . Port-A-Cath in place 07/24/2017  . Breast cancer, stage 2, right ([place]) 05/07/2017  . Malignant neoplasm of central portion of right breast in female, estrogen receptor positive ([place]) 04/02/2017  . Primary insomnia 09/27/2016  . ANA positive 09/27/2016  . Complete tear of right rotator cuff 05/11/2016  . High risk medication use 05/07/2016  . Uterine fibroid 12/08/2015  . History of miscarriage 12/08/2015  . Esophagitis 12/08/2015  . Diaphragmatic hernia 12/08/2015  . Calcium pyrophosphate deposition disease 12/08/2015  . Osteoarthritis of both hands 12/08/2015  . Osteoarthritis of both feet 12/08/2015  . History of total right knee replacement 12/08/2015  . Chondromalacia of both patellae 12/08/2015  . DDD (degenerative disc disease), lumbar 12/08/2015  . Essential hypertension 06/26/2013  . Paroxysmal atrial fibrillation ([place]) 06/26/2013  . Hyperlipidemia 06/26/2013  . DJD (degenerative joint disease) of knee 01/26/2013  . Hemorrhoids 07/11/2010  . IBS (irritable bowel syndrome) 07/11/2010  . Fatigue/loss of sleep 07/11/2010  . Night sweats 07/11/2010  . Chills 07/11/2010  . Weight gain 07/11/2010  . Inflammatory arthritis 07/11/2010  . Incontinence of urine 07/11/2010  . Thrombosed external hemorrhoids  07/11/2010    Past Medical History:  Diagnosis Date  . A-fib ([place])   . Arthritis   . Chondromalacia, patella 12/08/2015    . DDD (degenerative disc disease), lumbar 12/08/2015   S/P discectomy   . Diaphragmatic hernia 12/08/2015  . Dysrhythmia    A-Fib  . Edema of lower extremity 06/29/09   Lower Venous Exam- normal. No evidence of thrombus or thrombophlebitis.  . Esophagitis 12/08/2015  . Frequent urination at night   . GERD (gastroesophageal reflux disease)   . Hemorrhoids   . History of calcium pyrophosphate deposition disease (CPPD) 12/08/2015  . History of miscarriage 12/08/2015   x3  . History of total right knee replacement 12/08/2015  . Hypertension 02/24/09   Echo-EF 62%; Myocardial Perfusion Study-Normal. No signifcant ischemia noted.  . IBS (irritable bowel syndrome)   . Migraine   . Osteoarthritis of both feet 12/08/2015  . Osteoarthritis of both hands 12/08/2015  . PONV (postoperative nausea and vomiting)   . Uterine fibroid 12/08/2015    Family History  Problem Relation Age of Onset  . Other Mother   . Varicose Veins Mother   . Emphysema Mother   . Heart disease Father        heart attack  . Diabetes Sister   . Other Sister        pacemaker  . Diabetes Sister   . Heart disease Sister    Past Surgical History:  Procedure Laterality Date  . ABDOMINAL HYSTERECTOMY  1974  . APPENDECTOMY    . BACK SURGERY  2001  . BUNIONECTOMY  1986  . CESAREAN SECTION  1971  . COLONOSCOPY    . COLONOSCOPY WITH PROPOFOL N/A 05/28/2014   Procedure: COLONOSCOPY WITH PROPOFOL;  Surgeon: Carol Ada, MD;  Location: [facility];  Service: Endoscopy;  Laterality: N/A;  . ESOPHAGOGASTRODUODENOSCOPY (EGD) WITH PROPOFOL N/A 05/28/2014   Procedure: ESOPHAGOGASTRODUODENOSCOPY (EGD) WITH PROPOFOL;  Surgeon: Carol Ada, MD;  Location: [facility];  Service: Endoscopy;  Laterality: N/A;  . EXPLORATORY LAPAROTOMY     open procedure  . EYE SURGERY Bilateral    cataract removal  . PORTACATH PLACEMENT Right 05/07/2017   Procedure: INSERTION PORT-A-CATH;  Surgeon: Erroll Luna, MD;  Location: [facility];   Service: General;  Laterality: Right;  . SHOULDER SURGERY Right   . SIMPLE MASTECTOMY WITH AXILLARY SENTINEL NODE BIOPSY Right 05/07/2017   Procedure: RIGHT SIMPLE MASTECTOMY WITH AXILLARY SENTINEL NODE BIOPSY;  Surgeon: Erroll Luna, MD;  Location: [facility];  Service: General;  Laterality: Right;  . TOTAL KNEE ARTHROPLASTY Right 01/26/2013   DR Ronnie Derby  . TOTAL KNEE ARTHROPLASTY Right 01/26/2013   Procedure: TOTAL KNEE ARTHROPLASTY;  Surgeon: Vickey Huger, MD;  Location: [facility];  Service: Orthopedics;  Laterality: Right;  . [place]   Social History   Social History Narrative   [phone] Unable to ask abuse questions husband with her today.   Immunization History  Administered Date(s) Administered  . Influenza, High Dose Seasonal PF 10/07/2015, 10/19/2016, 10/25/2017  . Pneumococcal Conjugate-13 11/24/2013  . Pneumococcal Polysaccharide-23 01/10/2003  . Tdap 03/21/2017, 03/21/2017  . Zoster 08/01/2012  . Zoster Recombinat (Shingrix) 03/21/2017, 03/21/2017   Objective: Vital Signs: BP 129/69 (BP Location: Left Arm, Patient Position: Sitting, Cuff Size: Normal)   Pulse 70   Resp 14   Ht 5\' 7"  (1.702 m)   Wt 229 lb 9.6 oz (104.1 kg)   BMI 35.96 kg/m    Physical Exam  Constitutional: She is oriented to  person, place, and time. She appears well-developed and well-nourished.  HENT:  Head: Normocephalic and atraumatic.  Eyes: Conjunctivae and EOM are normal.  Neck: Normal range of motion.  Cardiovascular: Normal rate, regular rhythm, normal heart sounds and intact distal pulses.  Pulmonary/Chest: Effort normal and breath sounds normal.  Abdominal: Soft. Bowel sounds are normal.  Lymphadenopathy:    She has no cervical adenopathy.  Neurological: She is alert and oriented to person, place, and time.  Skin: Skin is warm and dry. Capillary refill takes less than 2 seconds.  Psychiatric: She has a normal mood and affect. Her behavior is normal.  Nursing note and  vitals reviewed.    Musculoskeletal Exam: C-spine thoracic lumbar spine good range of motion.  Shoulder joints elbow joints wrist joints were in good range of motion.  She has DIP and PIP thickening in her hands with no synovitis.  She has some crepitus in her left knee joint without any warmth swelling or effusion.  Right total knee replacement is doing well.  She had no synovitis or MTPs or PIPs.  CDAI Exam: CDAI Score: 1.6  Patient Global Assessment: 3 (mm); Provider Global Assessment: 3 (mm) Swollen: 0 ; Tender: 2  Joint Exam      Right  Left  Lumbar Spine   Tender     Knee      Tender     Investigation: No additional findings.  Imaging: Dg Bone Density  Result Date: 10/31/2017 EXAM: DUAL X-RAY ABSORPTIOMETRY (DXA) FOR BONE MINERAL DENSITY IMPRESSION: Referring Physician:  Chauncey Cruel Your patient completed a BMD test using Lunar IDXA DXA system ( analysis version: 16 ) manufactured by [employer]. Technologist: WLS PATIENT: Name: Durga, Saldarriaga Patient ID: [id number] Birth Date: 1934/02/06 Height: 67.0 in. Sex: Female Measured: 10/31/2017 Weight: 227.4 lbs. Indications: Arimidex, Breast Cancer History, Estrogen Deficient, Gabapentin, Hysterectomy, Omeprazole, Postmenopausal Fractures: None Treatments: Calcium (E943.0), Vitamin D (E933.5) ASSESSMENT: The BMD measured at AP Spine L1-L4 is 1.006 g/cm2 with a T-score of -1.5. This patient is considered osteopenic according to [facility]) criteria. The scan quality is limited by patient body habitus. Site Region Measured Date Measured Age YA BMD Significant CHANGE T-score AP Spine  L1-L4      12/31/2017    [age]         -1.5    1.006 g/cm2 DualFemur Neck Left  10/31/2017    83.0         -1.0    0.899 g/cm2 DualFemur Total Mean 10/31/2017    83.0         -0.3    0.967 g/cm2 World Health Organization [facility]) criteria for post-menopausal, Caucasian Women: Normal       T-score at or above -1 SD Osteopenia   T-score  between -1 and -2.5 SD Osteoporosis T-score at or below -2.5 SD RECOMMENDATION: 1. All patients should optimize calcium and vitamin D intake. 2. Consider FDA approved medical therapies in postmenopausal women and men aged 7 years and older, based on the following: a. A hip or vertebral (clinical or morphometric) fracture b. T- score < or = -2.5 at the femoral neck or spine after appropriate evaluation to exclude secondary causes c. Low bone mass (T-score between -1.0 and -2.5 at the femoral neck or spine) and a 10 year probability of a hip fracture > or = 3% or a 10 year probability of a major osteoporosis-related fracture > or = 20% based on the [place]-adapted WHO algorithm d. Clinician  judgment and/or patient preferences may indicate treatment for people with 10-year fracture probabilities above or below these levels FOLLOW-UP: People with diagnosed cases of osteoporosis or at high risk for fracture should have regular bone mineral density tests. For patients eligible for Medicare, routine testing is allowed once every 2 years. The testing frequency can be increased to one year for patients who have rapidly progressing disease, those who are receiving or discontinuing medical therapy to restore bone mass, or have additional risk factors. FRAX* 10-year Probability of Fracture Based on femoral neck BMD: DualFemur (Left) Major Osteoporotic Fracture: 4.8% Hip Fracture:                0.9% Population:                  [place] ([place]) Risk Factors:                None *FRAX is a Materials engineer of the [employer] for Metabolic Bone Disease, a World Pharmacologist (WHO) [facility]. ASSESSMENT: The probability of a major osteoporotic fracture is 4.8 % within the next ten years. The probability of hip fracture is 0.9  % within the next 10 years. Electronically Signed   By: Earle Gell M.D.   On: 10/31/2017 11:39    Recent Labs: Lab Results  Component Value Date   WBC 3.4 (L)  11/05/2017   HGB 12.6 11/05/2017   PLT 156 11/05/2017   NA 143 11/05/2017   K 3.5 11/05/2017   CL 103 11/05/2017   CO2 31 11/05/2017   GLUCOSE 112 (H) 11/05/2017   BUN 23 11/05/2017   CREATININE 0.90 11/05/2017   BILITOT 0.6 11/05/2017   ALKPHOS 81 11/05/2017   AST 29 11/05/2017   ALT 40 11/05/2017   PROT 6.4 (L) 11/05/2017   ALBUMIN 3.6 11/05/2017   CALCIUM 9.6 11/05/2017   GFRAA >60 11/05/2017    Speciality Comments: PLQ eye exam: 08/16/2017 normal. Dr. Katy Fitch. Follow up in 1 year.  Procedures:  No procedures performed Allergies: Oxycodone; Hydrocodone; Percocet [oxycodone-acetaminophen]; Soma [carisoprodol]; and Ultram [tramadol hcl]   Assessment / Plan:     Visit Diagnoses: Rheumatoid arthritis of multiple sites with negative rheumatoid factor-she has no synovitis on examination.  Her symptoms are very well controlled on Plaquenil.  Calcium pyrophosphate deposition disease-she has had no flares since she is taking colchicine.  High risk medication use - Current regimen includes Plaquenil 200 mg daily.  Last PLQ eye exam within normal limits on 08/16/2017. Last CBC showed WBC dropped to 3.4 on 11/05/17.  Last CMP stable on 11/05/17.  Recommend Pneumovax 23 as last vaccine was in 2005.Current regimen includes colchicine 0.6 mg daily.   Primary osteoarthritis of both hands-joint protection muscle strengthening was discussed.  Primary osteoarthritis of both feet-she has been wearing proper fitting shoes which is helped her feet.  She has had no flares of plantar fasciitis.  History of total right knee replacement - Performed by Dr. Ronnie Derby  DDD (degenerative disc disease), lumbar-she continues to have some discomfort.  She has been exercising and going to the gym which has been helpful.  Osteopenia-I reviewed her most recent bone density with her.  Use of calcium vitamin D and resistive exercises were discussed.  History of breast cancer- right breast. Currently on treatment with  Herceptin and anastrozole for Stage 2 breast cancer.  Last Herceptin cycle (8) on 11/05/17.  Pure hypercholesterolemia  Paroxysmal atrial fibrillation (HCC)  Essential hypertension  Orders: No orders of the defined types were placed in this encounter.  No orders of the defined types were placed in this encounter.   Face-to-face time spent with patient was 30 minutes. Greater than 50% of time was spent in counseling and coordination of care.  Follow-Up Instructions: Return in about 5 months (around 04/14/2018) for Rheumatoid arthritis, CPPD.   Bo Merino, MD  Note - This record has been created using Editor, commissioning.  Chart creation errors have been sought, but may not always  have been located. Such creation errors do not reflect on  the standard of medical care.

## 2017-11-05 ENCOUNTER — Inpatient Hospital Stay: Payer: Medicare Other

## 2017-11-05 ENCOUNTER — Other Ambulatory Visit: Payer: Self-pay | Admitting: Medical

## 2017-11-05 ENCOUNTER — Inpatient Hospital Stay (HOSPITAL_BASED_OUTPATIENT_CLINIC_OR_DEPARTMENT_OTHER): Payer: Medicare Other | Admitting: Medical

## 2017-11-05 VITALS — BP 128/56 | HR 74 | Temp 97.8°F | Resp 18

## 2017-11-05 DIAGNOSIS — Z95828 Presence of other vascular implants and grafts: Secondary | ICD-10-CM

## 2017-11-05 DIAGNOSIS — M62838 Other muscle spasm: Secondary | ICD-10-CM | POA: Diagnosis not present

## 2017-11-05 DIAGNOSIS — C50911 Malignant neoplasm of unspecified site of right female breast: Secondary | ICD-10-CM

## 2017-11-05 DIAGNOSIS — Z170 Estrogen receptor positive status [ER+]: Secondary | ICD-10-CM

## 2017-11-05 DIAGNOSIS — C50111 Malignant neoplasm of central portion of right female breast: Secondary | ICD-10-CM | POA: Diagnosis not present

## 2017-11-05 DIAGNOSIS — Z5112 Encounter for antineoplastic immunotherapy: Secondary | ICD-10-CM | POA: Diagnosis not present

## 2017-11-05 LAB — CBC WITH DIFFERENTIAL/PLATELET
ABS IMMATURE GRANULOCYTES: 0 10*3/uL (ref 0.00–0.07)
BASOS PCT: 1 %
Basophils Absolute: 0 10*3/uL (ref 0.0–0.1)
EOS PCT: 1 %
Eosinophils Absolute: 0 10*3/uL (ref 0.0–0.5)
HCT: 40.8 % (ref 36.0–46.0)
Hemoglobin: 12.6 g/dL (ref 12.0–15.0)
Immature Granulocytes: 0 %
Lymphocytes Relative: 32 %
Lymphs Abs: 1.1 10*3/uL (ref 0.7–4.0)
MCH: 26.1 pg (ref 26.0–34.0)
MCHC: 30.9 g/dL (ref 30.0–36.0)
MCV: 84.5 fL (ref 80.0–100.0)
MONO ABS: 0.4 10*3/uL (ref 0.1–1.0)
Monocytes Relative: 10 %
NEUTROS ABS: 1.9 10*3/uL (ref 1.7–7.7)
Neutrophils Relative %: 56 %
PLATELETS: 156 10*3/uL (ref 150–400)
RBC: 4.83 MIL/uL (ref 3.87–5.11)
RDW: 14.5 % (ref 11.5–15.5)
WBC: 3.4 10*3/uL — ABNORMAL LOW (ref 4.0–10.5)
nRBC: 0 % (ref 0.0–0.2)

## 2017-11-05 LAB — COMPREHENSIVE METABOLIC PANEL
ALBUMIN: 3.6 g/dL (ref 3.5–5.0)
ALK PHOS: 81 U/L (ref 38–126)
ALT: 40 U/L (ref 0–44)
AST: 29 U/L (ref 15–41)
Anion gap: 9 (ref 5–15)
BILIRUBIN TOTAL: 0.6 mg/dL (ref 0.3–1.2)
BUN: 23 mg/dL (ref 8–23)
CO2: 31 mmol/L (ref 22–32)
CREATININE: 0.9 mg/dL (ref 0.44–1.00)
Calcium: 9.6 mg/dL (ref 8.9–10.3)
Chloride: 103 mmol/L (ref 98–111)
GFR calc Af Amer: >60 mL/min (ref >60)
GFR calc non Af Amer: 58 mL/min — ABNORMAL LOW (ref >60)
GLUCOSE: 112 mg/dL — AB (ref 70–99)
Potassium: 3.5 mmol/L (ref 3.5–5.1)
Sodium: 143 mmol/L (ref 135–145)
TOTAL PROTEIN: 6.4 g/dL — AB (ref 6.5–8.1)

## 2017-11-05 MED ORDER — ACETAMINOPHEN 325 MG PO TABS
ORAL_TABLET | ORAL | Status: AC
  Filled 2017-11-05: qty 2

## 2017-11-05 MED ORDER — TRASTUZUMAB CHEMO 150 MG IV SOLR
6.0000 mg/kg | Freq: Once | INTRAVENOUS | Status: AC
  Administered 2017-11-05: 651 mg via INTRAVENOUS
  Filled 2017-11-05: qty 31

## 2017-11-05 MED ORDER — DIPHENHYDRAMINE HCL 25 MG PO CAPS
25.0000 mg | ORAL_CAPSULE | Freq: Once | ORAL | Status: AC
  Administered 2017-11-05: 25 mg via ORAL

## 2017-11-05 MED ORDER — SODIUM CHLORIDE 0.9% FLUSH
10.0000 mL | INTRAVENOUS | Status: DC | PRN
  Administered 2017-11-05: 10 mL
  Filled 2017-11-05: qty 10

## 2017-11-05 MED ORDER — ACETAMINOPHEN 325 MG PO TABS
650.0000 mg | ORAL_TABLET | Freq: Once | ORAL | Status: AC
  Administered 2017-11-05: 650 mg via ORAL

## 2017-11-05 MED ORDER — SODIUM CHLORIDE 0.9 % IV SOLN
Freq: Once | INTRAVENOUS | Status: AC
  Administered 2017-11-05: 12:00:00 via INTRAVENOUS
  Filled 2017-11-05: qty 250

## 2017-11-05 MED ORDER — HEPARIN SOD (PORK) LOCK FLUSH 100 UNIT/ML IV SOLN
500.0000 [IU] | Freq: Once | INTRAVENOUS | Status: AC | PRN
  Administered 2017-11-05: 500 [IU]
  Filled 2017-11-05: qty 5

## 2017-11-05 MED ORDER — DIPHENHYDRAMINE HCL 25 MG PO CAPS
ORAL_CAPSULE | ORAL | Status: AC
  Filled 2017-11-05: qty 1

## 2017-11-05 MED ORDER — SODIUM CHLORIDE 0.9 % IJ SOLN
10.0000 mL | Freq: Once | INTRAMUSCULAR | Status: AC
  Administered 2017-11-05: 10 mL
  Filled 2017-11-05: qty 10

## 2017-11-05 NOTE — Progress Notes (Signed)
Prior to Herceptin administration pt c/o left bicep pain. Pt describes the pain as "deep and pulsating" pt stated it started 20 minutes ago. Pt states the pain does not radiate. Pt skin warm and dry. Respirations equal and unlabored. Pt states position changes do not help pain. Pt denies any chest pain. Pt has normal range of motion with left arm. Lucianne Lei, [place] aware

## 2017-11-05 NOTE — Patient Instructions (Signed)
[facility] Discharge Instructions for Patients Receiving Chemotherapy  Today you received the following agents Herceptin.   If you develop nausea and vomiting that is not controlled by your nausea medication, call the clinic.   BELOW ARE SYMPTOMS THAT SHOULD BE REPORTED IMMEDIATELY:  *FEVER GREATER THAN 100.5 F  *CHILLS WITH OR WITHOUT FEVER  NAUSEA AND VOMITING THAT IS NOT CONTROLLED WITH YOUR NAUSEA MEDICATION  *UNUSUAL SHORTNESS OF BREATH  *UNUSUAL BRUISING OR BLEEDING  TENDERNESS IN MOUTH AND THROAT WITH OR WITHOUT PRESENCE OF ULCERS  *URINARY PROBLEMS  *BOWEL PROBLEMS  UNUSUAL RASH Items with * indicate a potential emergency and should be followed up as soon as possible.  Feel free to call the clinic should you have any questions or concerns. The clinic phone number is [phone].  Please show the [facility] at check-in to the Emergency Department and triage nurse.

## 2017-11-11 NOTE — Progress Notes (Signed)
[facility] Progress Note   Tara Cox [id number] [birth year] 83 y.o.  [name] is managed by Dr. Jana Hakim  Actively treated with chemotherapy/immunotherapy: yes  Current Therapy: Herceptin and anastrozole  Last Treated: 11/05/2017 (cycle 8)  Assessment: Plan:    Muscle spasm  Breast cancer, stage 2, right (HCC)   Muscle spasm of the left medial biceps: The patient's physical exam was consistent with a muscle spasm of the left medial biceps.  She was told to apply heat for 20 minutes on several times a day.  She will return as needed.  Stage II breast cancer of the right breast: The patient continues to be managed by Dr. Jana Hakim and is receiving cycle 8 of Herceptin today.  She continues taking anastrozole.  Please see After Visit Summary for patient specific instructions.  Future Appointments  Date Time Provider [place]  11/13/2017 10:45 AM Bo Merino, MD PR-PR None  11/22/2017 10:00 AM MC ECHO 1-BUZZ MC-ECHOLAB [facility]  11/22/2017 11:00 AM Larey Dresser, MD MC-HVSC None  11/26/2017 10:30 AM [facility] LAB 1 [facility] None  11/26/2017 10:45 AM [facility] FLUSH [facility] None  11/26/2017 11:45 AM [facility] INFUSION [facility] None  12/17/2017 10:15 AM [facility] LAB 6 [facility] None  12/17/2017 10:30 AM [facility] [place] FLUSH [facility] None  12/17/2017 11:00 AM Magrinat, Virgie Dad, MD [facility] None  12/17/2017 11:45 AM [facility] INFUSION [facility] None  01/07/2018  9:45 AM [facility] LAB 6 [facility] None  01/07/2018 10:00 AM [facility] [place] FLUSH [facility] None  01/07/2018 11:00 AM [facility] INFUSION [facility] None  01/17/2018 10:30 AM GI-BCG [place] 1 GI-BCGUS GI-BREAST CE  01/28/2018  9:00 AM [facility] LAB 3 [facility] None  01/28/2018  9:15 AM [facility] FLUSH [facility] None  01/28/2018 10:15 AM [facility] INFUSION [facility] None  02/18/2018 11:15 AM [facility] LAB 4 [facility] None  02/18/2018 11:30 AM  [facility] FLUSH [facility] None  02/18/2018 12:30 PM [facility] INFUSION [facility] None  03/11/2018 12:00 PM [facility] LAB 3 [facility] None  03/11/2018 12:15 PM [facility] [place] FLUSH [facility] None  03/11/2018  1:15 PM [facility] INFUSION [facility] None  04/01/2018 11:15 AM [facility] LAB 6 [facility] None  04/01/2018 11:30 AM [facility] [place] FLUSH [facility] None  04/01/2018 12:30 PM [facility] INFUSION [facility] None  04/22/2018 11:15 AM [facility] LAB 6 [facility] None  04/22/2018 11:30 AM [facility] [place] FLUSH [facility] None  04/22/2018 12:30 PM [facility] INFUSION [facility] None  05/13/2018 11:15 AM [facility] LAB 6 [facility] None  05/13/2018 11:30 AM [facility] [place] FLUSH [facility] None  05/13/2018 12:30 PM [facility] INFUSION [facility] None    No orders of the defined types were placed in this encounter.      Subjective:   Patient ID:  Tara Cox is a 83 y.o. (DOB 28-Jan-1934) female.  Chief Complaint: No chief complaint on file.   HPI Tara Cox is an 83 year old female with a triple positive breast cancer who is managed by Dr. Jana Hakim and is currently treated with anastrozole and trastuzumab.  She is here today receiving cycle 8 of trastuzumab.  While having chemotherapy she reported noting pain in her left medial bicep.  She denies diaphoresis, shortness of breath, chest pressure, or chest pain.  She has had no change in activity and denies trauma.  Medications: I have reviewed the patient's current medications.  Allergies:  Allergies  Allergen Reactions  . Oxycodone Itching, Nausea And Vomiting and Other (See Comments)    Full body weakness   . Hydrocodone Other (See Comments)  Reports they make her feel sick/drowsy  . Percocet [Oxycodone-Acetaminophen] Itching    Pt says she can take tylenol   . Soma [Carisoprodol] Nausea And Vomiting  . Ultram [Tramadol Hcl] Nausea And Vomiting    Past Medical History:  Diagnosis Date  . A-fib ([place])   .  Arthritis   . 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 DDD (degenerative disc disease), lumbar 12/08/2015   S/P discectomy   . Diaphragmatic hernia 12/08/2015  . Dysrhythmia    A-Fib  . Edema of lower extremity 06/29/09   Lower Venous Exam- normal. No evidence of thrombus or thrombophlebitis.  . Esophagitis 12/08/2015  . Frequent urination at night   . GERD (gastroesophageal reflux disease)   . Hemorrhoids   . History of calcium pyrophosphate deposition disease (CPPD) 12/08/2015  . History of miscarriage 12/08/2015   x3  . History of total right knee replacement 12/08/2015  . Hypertension 02/24/09   Echo-EF 62%; Myocardial Perfusion Study-Normal. No signifcant ischemia noted.  . IBS (irritable bowel syndrome)   . Migraine   . Osteoarthritis of both feet 12/08/2015  . Osteoarthritis of both hands 12/08/2015  . PONV (postoperative nausea and vomiting)   . Uterine fibroid 12/08/2015    Past Surgical History:  Procedure Laterality Date  . ABDOMINAL HYSTERECTOMY  1974  . APPENDECTOMY    . BACK SURGERY  2001  . BUNIONECTOMY  1986  . CESAREAN SECTION  1971  . COLONOSCOPY    . COLONOSCOPY WITH PROPOFOL N/A 05/28/2014   Procedure: COLONOSCOPY WITH PROPOFOL;  Surgeon: Carol Ada, MD;  Location: [facility];  Service: Endoscopy;  Laterality: N/A;  . ESOPHAGOGASTRODUODENOSCOPY (EGD) WITH PROPOFOL N/A 05/28/2014   Procedure: ESOPHAGOGASTRODUODENOSCOPY (EGD) WITH PROPOFOL;  Surgeon: Carol Ada, MD;  Location: [facility];  Service: Endoscopy;  Laterality: N/A;  . EXPLORATORY LAPAROTOMY     open procedure  . EYE SURGERY Bilateral    cataract removal  . PORTACATH PLACEMENT Right 05/07/2017   Procedure: INSERTION PORT-A-CATH;  Surgeon: Erroll Luna, MD;  Location: [place];  Service: General;  Laterality: Right;  . SHOULDER SURGERY Right   . SIMPLE MASTECTOMY WITH AXILLARY SENTINEL NODE BIOPSY Right 05/07/2017   Procedure: RIGHT SIMPLE MASTECTOMY WITH AXILLARY SENTINEL NODE BIOPSY;   Surgeon: Erroll Luna, MD;  Location: [place];  Service: General;  Laterality: Right;  . TOTAL KNEE ARTHROPLASTY Right 01/26/2013   DR Ronnie Derby  . TOTAL KNEE ARTHROPLASTY Right 01/26/2013   Procedure: TOTAL KNEE ARTHROPLASTY;  Surgeon: Vickey Huger, MD;  Location: [place];  Service: Orthopedics;  Laterality: Right;  . UTERINE FIBROID SURGERY  1967    Family History  Problem Relation Age of Onset  . Other Mother   . Varicose Veins Mother   . Emphysema Mother   . Heart disease Father        heart attack  . Diabetes Sister   . Other Sister        pacemaker  . Diabetes Sister   . Heart disease Sister     Social History   Socioeconomic History  . Marital status: Married    Spouse name: Not on file  . Number of children: Not on file  . Years of education: Not on file  . Highest education level: Not on file  Occupational History  . Not on file  Social Needs  . Financial resource strain: Not on file  . Food insecurity:    Worry: Not on file    Inability: Not on file  . Transportation needs:  Medical: Not on file    Non-medical: Not on file  Tobacco Use  . Smoking status: Former Smoker    Packs/day: 1.00    Years: 20.00    Pack years: 20.00    Types: Cigarettes    Last attempt to quit: 02/19/1990    Years since quitting: 27.7  . Smokeless tobacco: Never Used  . Tobacco comment: ' i QUIT SMOKING MANY YEARS AGO "  Substance and Sexual Activity  . Alcohol use: No  . Drug use: Never  . Sexual activity: Not on file  Lifestyle  . Physical activity:    Days per week: Not on file    Minutes per session: Not on file  . Stress: Not on file  Relationships  . Social connections:    Talks on phone: Not on file    Gets together: Not on file    Attends religious service: Not on file    Active member of club or organization: Not on file    Attends meetings of clubs or organizations: Not on file    Relationship status: Not on file  . Intimate partner violence:    Fear of current  or ex partner: Not on file    Emotionally abused: Not on file    Physically abused: Not on file    Forced sexual activity: Not on file  Other Topics Concern  . Not on file  Social History Narrative   [phone] Unable to ask abuse questions husband with her today.    Past Medical History, Surgical history, Social history, and Family history were reviewed and updated as appropriate.   Please see review of systems for further details on the patient's review from today.   Review of Systems:  Review of Systems  Constitutional: Negative for diaphoresis.  Respiratory: Negative for chest tightness and shortness of breath.   Cardiovascular: Negative for chest pain and palpitations.  Musculoskeletal: Positive for myalgias. Negative for arthralgias, back pain and joint swelling.    Objective:   Physical Exam:  There were no vitals taken for this visit. ECOG: 0  Physical Exam  HENT:  Head: Normocephalic and atraumatic.  Eyes: Conjunctivae are normal. Right eye exhibits no discharge. Left eye exhibits no discharge. No scleral icterus.  Cardiovascular: Normal rate, regular rhythm and normal heart sounds. Exam reveals no friction rub.  No murmur heard. Pulmonary/Chest: Effort normal and breath sounds normal. No stridor. No respiratory distress. She has no wheezes. She has no rales.  Musculoskeletal: Normal range of motion. She exhibits tenderness. She exhibits no edema or deformity.  Tenderness and an area of induration along the left medial biceps.  Rolling this area of induration under the fingers elicits pain.  Neurological: She is alert.  Skin: She is not diaphoretic.  Psychiatric: She has a normal mood and affect. Her behavior is normal. Judgment and thought content normal.    Lab Review:     Component Value Date/Time   NA 143 11/05/2017 1028   NA 142 09/22/2015   K 3.5 11/05/2017 1028   CL 103 11/05/2017 1028   CO2 31 11/05/2017 1028   GLUCOSE 112 (H) 11/05/2017 1028   BUN 23  11/05/2017 1028   BUN 19 09/22/2015   CREATININE 0.90 11/05/2017 1028   CREATININE 0.91 (H) 10/11/2016 1136   CALCIUM 9.6 11/05/2017 1028   PROT 6.4 (L) 11/05/2017 1028   ALBUMIN 3.6 11/05/2017 1028   AST 29 11/05/2017 1028   ALT 40 11/05/2017 1028   ALKPHOS 81  11/05/2017 1028   BILITOT 0.6 11/05/2017 1028   GFRNONAA 58 (L) 11/05/2017 1028   GFRNONAA 60 10/11/2016 1136   GFRAA >60 11/05/2017 1028   GFRAA 69 10/11/2016 1136       Component Value Date/Time   WBC 3.4 (L) 11/05/2017 1028   RBC 4.83 11/05/2017 1028   HGB 12.6 11/05/2017 1028   HGB 13.4 10/22/2006 0841   HCT 40.8 11/05/2017 1028   HCT 40.1 10/22/2006 0841   PLT 156 11/05/2017 1028   PLT 203 10/22/2006 0841   MCV 84.5 11/05/2017 1028   MCV 81.4 10/22/2006 0841   MCH 26.1 11/05/2017 1028   MCHC 30.9 11/05/2017 1028   RDW 14.5 11/05/2017 1028   RDW 14.6 (H) 10/22/2006 0841   LYMPHSABS 1.1 11/05/2017 1028   LYMPHSABS 3.4 (H) 10/22/2006 0841   MONOABS 0.4 11/05/2017 1028   MONOABS 0.4 10/22/2006 0841   EOSABS 0.0 11/05/2017 1028   EOSABS 0.0 10/22/2006 0841   BASOSABS 0.0 11/05/2017 1028   BASOSABS 0.0 10/22/2006 0841   -------------------------------  Imaging from last 24 hours (if applicable):  Radiology interpretation: Dg Bone Density  Result Date: 10/31/2017 EXAM: DUAL X-RAY ABSORPTIOMETRY (DXA) FOR BONE MINERAL DENSITY IMPRESSION: Referring Physician:  Chauncey Cruel Your patient completed a BMD test using Lunar IDXA DXA system ( analysis version: 16 ) manufactured by [employer]. Technologist: WLS PATIENT: Name: Laurynn, Mccorvey Patient ID: [id number] Birth Date: 1934-12-20 Height: 67.0 in. Sex: Female Measured: 10/31/2017 Weight: 227.4 lbs. Indications: Arimidex, Breast Cancer History, Estrogen Deficient, Gabapentin, Hysterectomy, Omeprazole, Postmenopausal Fractures: None Treatments: Calcium (E943.0), Vitamin D (E933.5) ASSESSMENT: The BMD measured at AP Spine L1-L4 is 1.006 g/cm2 with a T-score of  -1.5. This patient is considered osteopenic according to [place] [facility]) criteria. The scan quality is limited by patient body habitus. Site Region Measured Date Measured Age YA BMD Significant CHANGE T-score AP Spine  L1-L4      10/31/2017    [age]         -1.5    1.006 g/cm2 DualFemur Neck Left  10/31/2017    [age]         -1.0    0.899 g/cm2 DualFemur Total Mean 10/31/2017    [age]         -0.3    0.967 g/cm2 World Health Organization [facility]) criteria for post-menopausal, Caucasian Women: Normal       T-score at or above -1 SD Osteopenia   T-score between -1 and -2.5 SD Osteoporosis T-score at or below -2.5 SD RECOMMENDATION: 1. All patients should optimize calcium and vitamin D intake. 2. Consider FDA approved medical therapies in postmenopausal women and men aged 43 years and older, based on the following: a. A hip or vertebral (clinical or morphometric) fracture b. T- score < or = -2.5 at the femoral neck or spine after appropriate evaluation to exclude secondary causes c. Low bone mass (T-score between -1.0 and -2.5 at the femoral neck or spine) and a 10 year probability of a hip fracture > or = 3% or a 10 year probability of a major osteoporosis-related fracture > or = 20% based on the [place]-adapted WHO algorithm d. Clinician judgment and/or patient preferences may indicate treatment for people with 10-year fracture probabilities above or below these levels FOLLOW-UP: People with diagnosed cases of osteoporosis or at high risk for fracture should have regular bone mineral density tests. For patients eligible for Medicare, routine testing is allowed once every 2 years. The testing frequency  can be increased to one year for patients who have rapidly progressing disease, those who are receiving or discontinuing medical therapy to restore bone mass, or have additional risk factors. FRAX* 10-year Probability of Fracture Based on femoral neck BMD: DualFemur (Left) Major Osteoporotic Fracture: 4.8% Hip  Fracture:                0.9% Population:                  [place] ([place]) Risk Factors:                None *FRAX is a Materials engineer of the [employer] for Metabolic Bone Disease, a World Pharmacologist (WHO) [facility]. ASSESSMENT: The probability of a major osteoporotic fracture is 4.8 % within the next ten years. The probability of hip fracture is 0.9  % within the next 10 years. Electronically Signed   By: Earle Gell M.D.   On: 10/31/2017 11:39

## 2017-11-13 ENCOUNTER — Encounter: Payer: Self-pay | Admitting: Rheumatology

## 2017-11-13 ENCOUNTER — Ambulatory Visit (INDEPENDENT_AMBULATORY_CARE_PROVIDER_SITE_OTHER): Payer: Medicare Other | Admitting: *Deleted

## 2017-11-13 VITALS — BP 129/69 | HR 70 | Resp 14 | Ht 67.0 in | Wt 229.6 lb

## 2017-11-13 DIAGNOSIS — M19072 Primary osteoarthritis, left ankle and foot: Secondary | ICD-10-CM

## 2017-11-13 DIAGNOSIS — Z79899 Other long term (current) drug therapy: Secondary | ICD-10-CM

## 2017-11-13 DIAGNOSIS — Z170 Estrogen receptor positive status [ER+]: Secondary | ICD-10-CM

## 2017-11-13 DIAGNOSIS — E7800 Pure hypercholesterolemia, unspecified: Secondary | ICD-10-CM

## 2017-11-13 DIAGNOSIS — M19042 Primary osteoarthritis, left hand: Secondary | ICD-10-CM

## 2017-11-13 DIAGNOSIS — M19071 Primary osteoarthritis, right ankle and foot: Secondary | ICD-10-CM

## 2017-11-13 DIAGNOSIS — M19041 Primary osteoarthritis, right hand: Secondary | ICD-10-CM

## 2017-11-13 DIAGNOSIS — Z96651 Presence of right artificial knee joint: Secondary | ICD-10-CM

## 2017-11-13 DIAGNOSIS — I10 Essential (primary) hypertension: Secondary | ICD-10-CM

## 2017-11-13 DIAGNOSIS — M8589 Other specified disorders of bone density and structure, multiple sites: Secondary | ICD-10-CM

## 2017-11-13 DIAGNOSIS — M1120 Other chondrocalcinosis, unspecified site: Secondary | ICD-10-CM

## 2017-11-13 DIAGNOSIS — M0609 Rheumatoid arthritis without rheumatoid factor, multiple sites: Secondary | ICD-10-CM | POA: Diagnosis not present

## 2017-11-13 DIAGNOSIS — C50111 Malignant neoplasm of central portion of right female breast: Secondary | ICD-10-CM

## 2017-11-13 DIAGNOSIS — I480 Paroxysmal atrial fibrillation: Secondary | ICD-10-CM

## 2017-11-13 DIAGNOSIS — M5136 Other intervertebral disc degeneration, lumbar region: Secondary | ICD-10-CM

## 2017-11-22 ENCOUNTER — Ambulatory Visit (HOSPITAL_BASED_OUTPATIENT_CLINIC_OR_DEPARTMENT_OTHER)
Admission: RE | Admit: 2017-11-22 | Discharge: 2017-11-22 | Disposition: A | Discharge status: Home / Self Care | Payer: Medicare Other | Source: Ambulatory Visit | Attending: Cardiology | Admitting: Cardiology

## 2017-11-22 ENCOUNTER — Ambulatory Visit (HOSPITAL_COMMUNITY)
Admission: RE | Admit: 2017-11-22 | Discharge: 2017-11-22 | Disposition: A | Discharge status: Home / Self Care | Payer: Medicare Other | Source: Ambulatory Visit | Attending: Family Medicine | Admitting: Family Medicine

## 2017-11-22 VITALS — BP 140/70 | HR 75 | Wt 227.8 lb

## 2017-11-22 DIAGNOSIS — C50911 Malignant neoplasm of unspecified site of right female breast: Secondary | ICD-10-CM | POA: Insufficient documentation

## 2017-11-22 DIAGNOSIS — I10 Essential (primary) hypertension: Secondary | ICD-10-CM | POA: Diagnosis not present

## 2017-11-22 DIAGNOSIS — C50111 Malignant neoplasm of central portion of right female breast: Secondary | ICD-10-CM | POA: Diagnosis not present

## 2017-11-22 DIAGNOSIS — I471 Supraventricular tachycardia: Secondary | ICD-10-CM | POA: Insufficient documentation

## 2017-11-22 DIAGNOSIS — Z7982 Long term (current) use of aspirin: Secondary | ICD-10-CM | POA: Diagnosis not present

## 2017-11-22 DIAGNOSIS — Z170 Estrogen receptor positive status [ER+]: Secondary | ICD-10-CM

## 2017-11-22 DIAGNOSIS — Z7901 Long term (current) use of anticoagulants: Secondary | ICD-10-CM | POA: Diagnosis not present

## 2017-11-22 DIAGNOSIS — Z9011 Acquired absence of right breast and nipple: Secondary | ICD-10-CM | POA: Insufficient documentation

## 2017-11-22 DIAGNOSIS — E785 Hyperlipidemia, unspecified: Secondary | ICD-10-CM | POA: Insufficient documentation

## 2017-11-22 DIAGNOSIS — Z8249 Family history of ischemic heart disease and other diseases of the circulatory system: Secondary | ICD-10-CM | POA: Diagnosis not present

## 2017-11-22 DIAGNOSIS — K219 Gastro-esophageal reflux disease without esophagitis: Secondary | ICD-10-CM | POA: Diagnosis not present

## 2017-11-22 DIAGNOSIS — I480 Paroxysmal atrial fibrillation: Secondary | ICD-10-CM

## 2017-11-22 DIAGNOSIS — Z79899 Other long term (current) drug therapy: Secondary | ICD-10-CM | POA: Diagnosis not present

## 2017-11-22 DIAGNOSIS — Z87891 Personal history of nicotine dependence: Secondary | ICD-10-CM | POA: Insufficient documentation

## 2017-11-22 NOTE — Patient Instructions (Signed)
Follow up and echocardiogram in 3 months with Dr.McLean

## 2017-11-22 NOTE — Progress Notes (Signed)
  Echocardiogram 2D Echocardiogram has been performed.  Tara Cox 11/22/2017, 10:47 AM

## 2017-11-24 NOTE — Progress Notes (Signed)
Oncology: Dr. Jana Hakim  83 y.o.with history of pseudogout, paroxysmal atrial fibrillation, osteoarthritis, and breast cancer was referred by Dr. Jana Hakim for cardio-oncology evaluation.    She was diagnosed in 3/19 with right breast cancer, ER+/PR+/HER2+.  She had right mastectomy in 4/19 with Herceptin x 1 year starting afterwards.  She has now completed XRT.   She wore a Zio patch in 8/19 showing a few short runs of SVT, no atrial fibrillation.   She returns for cardio-oncology followup today.  She is having less palpitations since I changed her beta blocker to Toprol XL.  No significant exertional dyspnea or chest pain.  No BRBPR/melena.   Labs (10/18): creatinine 0.91  PMH: 1. CPPD/pseudogout 2. GERD 3. Degenerative disc disease s/p discectomy 4. Right TKR 5. HTN 6. IBS 7. Hyperlipidemia 8. Atrial fibrillation: Paroxysmal, she is on warfarin.  - Zio patch (8/19): shorts runs SVT, no atrial fibrillation.  9. Cardiolite 12/14 was normal. 10. Breast cancer: Diagnosed 3/19 with right breast cancer, ER+/PR+/HER2+.  Right mastectomy in 4/19 with Herceptin x 1 year afterwards.  Completed XRT.  - Echo (4/19): EF 60-65%, mild focal basal septal hypertrophy, GLS -18.1%, normal RV size and systolic function.  - Echo (8/19): EF 60-65%, GLS -90.9%, grade 2 diastolic dysfunction, normal RV size and systolic function, mild MR.  - Echo (11/19): EF 60-65%, GLS -19.4%, normal RV size/function  Social History   Socioeconomic History  . Marital status: Married    Spouse name: Not on file  . Number of children: Not on file  . Years of education: Not on file  . Highest education level: Not on file  Occupational History  . Not on file  Social Needs  . Financial resource strain: Not on file  . Food insecurity:    Worry: Not on file    Inability: Not on file  . Transportation needs:    Medical: Not on file    Non-medical: Not on file  Tobacco Use  . Smoking status: Former Smoker     Packs/day: 1.00    Years: 20.00    Pack years: 20.00    Types: Cigarettes    Last attempt to quit: 02/19/1990    Years since quitting: 27.7  . Smokeless tobacco: Never Used  . Tobacco comment: ' i QUIT SMOKING MANY YEARS AGO "  Substance and Sexual Activity  . Alcohol use: No  . Drug use: Never  . Sexual activity: Not on file  Lifestyle  . Physical activity:    Days per week: Not on file    Minutes per session: Not on file  . Stress: Not on file  Relationships  . Social connections:    Talks on phone: Not on file    Gets together: Not on file    Attends religious service: Not on file    Active member of club or organization: Not on file    Attends meetings of clubs or organizations: Not on file    Relationship status: Not on file  . Intimate partner violence:    Fear of current or ex partner: Not on file    Emotionally abused: Not on file    Physically abused: Not on file    Forced sexual activity: Not on file  Other Topics Concern  . Not on file  Social History Narrative   [phone] Unable to ask abuse questions husband with her today.   Family History  Problem Relation Age of Onset  . Other Mother   . Varicose  Veins Mother   . Emphysema Mother   . Heart disease Father        heart attack  . Diabetes Sister   . Other Sister        pacemaker  . Diabetes Sister   . Heart disease Sister    ROS: All systems reviewed and negative except as per HPI.   Current Outpatient Medications  Medication Sig Dispense Refill  . anastrozole (ARIMIDEX) 1 MG tablet Take 1 tablet (1 mg total) by mouth daily. 90 tablet 4  . Ascorbic Acid (VITAMIN C WITH ROSE HIPS) 500 MG tablet Take 500 mg by mouth daily.    Marland Kitchen atorvastatin (LIPITOR) 20 MG tablet Take 20 mg by mouth at bedtime.     . Calcium Carbonate-Vit D-Min (CALCIUM 600+D3 PLUS MINERALS) 600-800 MG-UNIT TABS Take 1 tablet by mouth at bedtime.    . Cholecalciferol (VITAMIN D) 2000 units CAPS Take 2,000 Units by mouth daily. Taking  Vitamin D 3 2000 units daliy    . colchicine 0.6 MG tablet TAKE 1 TABLET DAILY BY  MOUTH. 90 tablet 1  . furosemide (LASIX) 20 MG tablet Take 20 mg by mouth daily.      Marland Kitchen gabapentin (NEURONTIN) 300 MG capsule Take 300 mg by mouth 2 (two) times daily.     Marland Kitchen HYDROcodone-acetaminophen (NORCO/VICODIN) 5-325 MG tablet Take 1 tablet by mouth every 6 (six) hours as needed for moderate pain. 20 tablet 0  . hydroxychloroquine (PLAQUENIL) 200 MG tablet TAKE 1 TABLET BY MOUTH DAILY 30 tablet 0  . lidocaine-prilocaine (EMLA) cream Apply 1 application topically as needed. 30 g 0  . losartan-hydrochlorothiazide (HYZAAR) 100-25 MG per tablet Take 1 tablet by mouth daily. IN THE MORNING.    . magnesium oxide (MAG-OX) 400 MG tablet Take 400 mg by mouth daily.     . metoprolol succinate (TOPROL-XL) 25 MG 24 hr tablet Take 1 tablet (25 mg total) by mouth daily. Take with or immediately following a meal. 90 tablet 3  . Multiple Vitamin (MULTIVITAMIN) tablet Take 1 tablet by mouth daily. CENTRUM    . omeprazole (PRILOSEC) 40 MG capsule Take 40 mg by mouth daily as needed (FOR ACID REFLUX/INDIGESTION).   12  . potassium chloride SA (K-DUR,KLOR-CON) 20 MEQ tablet Take 10 mEq by mouth daily.    . Trastuzumab (HERCEPTIN IV) Inject into the vein every 21 ( twenty-one) days.    Marland Kitchen triamcinolone cream (KENALOG) 0.1 % Apply 1 application topically 2 (two) times daily as needed (Vaginal Itching).    . warfarin (COUMADIN) 5 MG tablet Take 0.5-1 tablets (2.5-5 mg total) by mouth daily. Take 5 mg tablet daily or as directed by pharmacist from [facility] (Patient taking differently: Take 5 mg by mouth at bedtime. Taking according to doctors instructions after each lab check dosages vary. Clarify 09-30-17 Family  Dr. Kathryne Eriksson  orders lab draws for coumadin presently taking one and a half tablets  To equal 7.5 mg Mondays and Wednesdays all others days of the week she takes 5 mg of the coumadin.) 40 tablet 1  . acetaminophen  (TYLENOL) 650 MG CR tablet Take 650 mg by mouth every 8 (eight) hours as needed for pain.    Marland Kitchen aspirin-sod bicarb-citric acid (ALKA-SELTZER) 325 MG TBEF tablet Take 325 mg by mouth every 6 (six) hours as needed.    . benzonatate (TESSALON) 200 MG capsule Take 200 mg by mouth 3 (three) times daily as needed for cough.    Marland Kitchen  docusate sodium (COLACE) 250 MG capsule Take 250 mg by mouth daily as needed for constipation.    . hydrocortisone (ANUSOL-HC) 2.5 % rectal cream Place 1 application rectally 2 (two) times daily as needed for hemorrhoids or itching.    . NONFORMULARY OR COMPOUNDED ITEM Apply 1-2 g topically 4 (four) times daily as needed for pain. BACLOFEN-DOXEPIN-GABAPENTIN-TOPIRAMATE-PENTOXIFYLLINE  2   No current facility-administered medications for this encounter.    BP 140/70   Pulse 75   Wt 103.3 kg (227 lb 12.8 oz)   SpO2 95%   BMI 35.68 kg/m  General: NAD Neck: No JVD, no thyromegaly or thyroid nodule.  Lungs: Clear to auscultation bilaterally with normal respiratory effort. CV: Nondisplaced PMI.  Heart regular S1/S2, no S3/S4, no murmur.  No peripheral edema.  No carotid bruit.  Normal pedal pulses.  Abdomen: Soft, nontender, no hepatosplenomegaly, no distention.  Skin: Intact without lesions or rashes.  Neurologic: Alert and oriented x 3.  Psych: Normal affect. Extremities: No clubbing or cyanosis.  HEENT: Normal.   Assessment/Plan: 1. Breast cancer: She will be on Herceptin for a total of a year.  I reviewed today's echo, EF and strain remain stable. - Repeat echo in 3 months.   2. Atrial fibrillation: Paroxysmal.  She is in NSR today.  Zio patch in 8/19 showed a few short runs SVT.  - Continue Toprol XL 25 mg daily (palpitations improved).  - Continue warfarin.  3. HTN: BP borderline high.  Continue current meds for now.   Followup in 3 months with echo.    Loralie Champagne 11/24/2017

## 2017-11-26 ENCOUNTER — Inpatient Hospital Stay: Payer: Medicare Other

## 2017-11-26 ENCOUNTER — Inpatient Hospital Stay: Payer: Medicare Other | Attending: Oncology

## 2017-11-26 VITALS — BP 142/74 | HR 61 | Temp 97.8°F | Resp 17

## 2017-11-26 DIAGNOSIS — Z5112 Encounter for antineoplastic immunotherapy: Secondary | ICD-10-CM | POA: Insufficient documentation

## 2017-11-26 DIAGNOSIS — C50111 Malignant neoplasm of central portion of right female breast: Secondary | ICD-10-CM | POA: Diagnosis present

## 2017-11-26 DIAGNOSIS — C50911 Malignant neoplasm of unspecified site of right female breast: Secondary | ICD-10-CM

## 2017-11-26 DIAGNOSIS — Z170 Estrogen receptor positive status [ER+]: Secondary | ICD-10-CM

## 2017-11-26 DIAGNOSIS — Z95828 Presence of other vascular implants and grafts: Secondary | ICD-10-CM

## 2017-11-26 LAB — CBC WITH DIFFERENTIAL/PLATELET
Abs Immature Granulocytes: 0 10*3/uL (ref 0.00–0.07)
BASOS ABS: 0 10*3/uL (ref 0.0–0.1)
Basophils Relative: 1 %
EOS ABS: 0 10*3/uL (ref 0.0–0.5)
Eosinophils Relative: 1 %
HEMATOCRIT: 41.9 % (ref 36.0–46.0)
Hemoglobin: 12.9 g/dL (ref 12.0–15.0)
IMMATURE GRANULOCYTES: 0 %
LYMPHS ABS: 1.4 10*3/uL (ref 0.7–4.0)
Lymphocytes Relative: 37 %
MCH: 26.2 pg (ref 26.0–34.0)
MCHC: 30.8 g/dL (ref 30.0–36.0)
MCV: 85 fL (ref 80.0–100.0)
Monocytes Absolute: 0.4 10*3/uL (ref 0.1–1.0)
Monocytes Relative: 9 %
NEUTROS PCT: 52 %
NRBC: 0 % (ref 0.0–0.2)
Neutro Abs: 2 10*3/uL (ref 1.7–7.7)
Platelets: 141 10*3/uL — ABNORMAL LOW (ref 150–400)
RBC: 4.93 MIL/uL (ref 3.87–5.11)
RDW: 14.5 % (ref 11.5–15.5)
WBC: 3.8 10*3/uL — AB (ref 4.0–10.5)

## 2017-11-26 LAB — COMPREHENSIVE METABOLIC PANEL
ALT: 32 U/L (ref 0–44)
ANION GAP: 8 (ref 5–15)
AST: 24 U/L (ref 15–41)
Albumin: 3.5 g/dL (ref 3.5–5.0)
Alkaline Phosphatase: 79 U/L (ref 38–126)
BUN: 20 mg/dL (ref 8–23)
CALCIUM: 9.4 mg/dL (ref 8.9–10.3)
CHLORIDE: 104 mmol/L (ref 98–111)
CO2: 31 mmol/L (ref 22–32)
Creatinine, Ser: 0.86 mg/dL (ref 0.44–1.00)
Glucose, Bld: 106 mg/dL — ABNORMAL HIGH (ref 70–99)
Potassium: 3.7 mmol/L (ref 3.5–5.1)
Sodium: 143 mmol/L (ref 135–145)
Total Bilirubin: 0.5 mg/dL (ref 0.3–1.2)
Total Protein: 6.4 g/dL — ABNORMAL LOW (ref 6.5–8.1)

## 2017-11-26 MED ORDER — SODIUM CHLORIDE 0.9 % IV SOLN
Freq: Once | INTRAVENOUS | Status: AC
  Administered 2017-11-26: 12:00:00 via INTRAVENOUS
  Filled 2017-11-26: qty 250

## 2017-11-26 MED ORDER — TRASTUZUMAB CHEMO 150 MG IV SOLR
6.0000 mg/kg | Freq: Once | INTRAVENOUS | Status: AC
  Administered 2017-11-26: 651 mg via INTRAVENOUS
  Filled 2017-11-26: qty 31

## 2017-11-26 MED ORDER — ACETAMINOPHEN 325 MG PO TABS
ORAL_TABLET | ORAL | Status: AC
  Filled 2017-11-26: qty 2

## 2017-11-26 MED ORDER — DIPHENHYDRAMINE HCL 25 MG PO CAPS
25.0000 mg | ORAL_CAPSULE | Freq: Once | ORAL | Status: AC
  Administered 2017-11-26: 25 mg via ORAL

## 2017-11-26 MED ORDER — ACETAMINOPHEN 325 MG PO TABS
650.0000 mg | ORAL_TABLET | Freq: Once | ORAL | Status: AC
  Administered 2017-11-26: 650 mg via ORAL

## 2017-11-26 MED ORDER — SODIUM CHLORIDE 0.9% FLUSH
10.0000 mL | INTRAVENOUS | Status: DC | PRN
  Administered 2017-11-26: 10 mL
  Filled 2017-11-26: qty 10

## 2017-11-26 MED ORDER — DIPHENHYDRAMINE HCL 25 MG PO CAPS
ORAL_CAPSULE | ORAL | Status: AC
  Filled 2017-11-26: qty 1

## 2017-11-26 MED ORDER — HEPARIN SOD (PORK) LOCK FLUSH 100 UNIT/ML IV SOLN
500.0000 [IU] | Freq: Once | INTRAVENOUS | Status: AC | PRN
  Administered 2017-11-26: 500 [IU]
  Filled 2017-11-26: qty 5

## 2017-11-26 NOTE — Patient Instructions (Signed)
[facility] Discharge Instructions for Patients Receiving Chemotherapy  Today you received the following chemotherapy agents Herceptin  To help prevent nausea and vomiting after your treatment, we encourage you to take your nausea medication as directed   If you develop nausea and vomiting that is not controlled by your nausea medication, call the clinic.   BELOW ARE SYMPTOMS THAT SHOULD BE REPORTED IMMEDIATELY:  *FEVER GREATER THAN 100.5 F  *CHILLS WITH OR WITHOUT FEVER  NAUSEA AND VOMITING THAT IS NOT CONTROLLED WITH YOUR NAUSEA MEDICATION  *UNUSUAL SHORTNESS OF BREATH  *UNUSUAL BRUISING OR BLEEDING  TENDERNESS IN MOUTH AND THROAT WITH OR WITHOUT PRESENCE OF ULCERS  *URINARY PROBLEMS  *BOWEL PROBLEMS  UNUSUAL RASH Items with * indicate a potential emergency and should be followed up as soon as possible.  Feel free to call the clinic should you have any questions or concerns. The clinic phone number is [phone].  Please show the CHEMO ALERT CARD at check-in to the Emergency Department and triage nurse.   

## 2017-12-16 NOTE — Progress Notes (Signed)
[place]  [phone]) [phone] [phone]) [phone]     ID: Tara Cox DOB: 1934/12/23  MR#: [id number]  JYN#:[id number]  Patient Care Team: [name], MD as PCP - General (Family Medicine) Magrinat, Virgie Dad, MD as Consulting Physician (Oncology) Erroll Luna, MD as Consulting Physician (General Surgery) Kyung Rudd, MD as Consulting Physician (Radiation Oncology) Vickey Huger, MD as Consulting Physician (Orthopedic Surgery) Bo Merino, MD as Consulting Physician (Rheumatology) Lorretta Harp, MD as Consulting Physician (Cardiology) OTHER MD:   CHIEF COMPLAINT: Triple positive breast cancer  CURRENT TREATMENT: Anastrozole, trastuzumab   HISTORY OF CURRENT ILLNESS: From the original intake note:  Tara Cox had mammography at the breast center October 11, 2011 showing calcifications in the upper right breast.  Six-month follow-up was suggested, but  the patient did not follow-up.  More recently she noticed nipple inversion on the right breast and followed this without reporting it over the last several months. She did undergo bilateral diagnostic mammography with tomography and right breast ultrasonography at [facility] on 03/26/2017 showing: breast density category B. There is a highly suspicious right retroareolar mass  measuring approximately 2.3 x 1.5 x 2.8 cm  with associated calcifications, together the mass and calcifications measure up to 3.3 cm mammographically. There are 2 lymph nodes in the right axilla with borderline thickened cortices, 1 of which measures 1 x 0.9 x 0.6 cm with a cortex thickness of 0.4 mm.  Accordingly on 03/29/2017 she proceeded to biopsy of the right breast and one axillary lymph node.. The pathology from this procedure showed ([id number]): Invasive lobular carcinoma,grade II, E-cadherin negative. The right axillary lymph node was negative for carcinoma. Prognostic indicators significant for: estrogen  receptor, 100% positive and progesterone receptor, 100% positive, both with strong staining intensity. Proliferation marker Ki67 at 25%. HER2 amplified with ratios  HER2/CEP17 signals 2.22 and average HER2 copies per cell 3.00  The patient's subsequent history is as detailed below.  INTERVAL HISTORY: Tara Cox returns today for follow-up of her triple positive breast cancer. She is accompanied by her husband.  The patient continues on anastrozole, which she is tolerating well. She experiences hot flashes, which she notices usually start up around the first week after her trastuzumab treatment. She had vaginal dryness before she began taking this medication, but she is currently not experiencing this symptom.   She also continues on trastuzumab, due every 21 days, which she she tolerating well. She is due for a dose today (12/16/2017). About a week after treatment, she notices that that is when she has her hotflashes and she also experiences irritability.    Since her last visit here, she underwent a bone density screening on 10/31/2017, showed a T-score of -1.5, which is considered osteopenic.    She also underwent an echocardiogram on 11/22/2017, which showed an ejection fraction in the 60% - 65% range.   REVIEW OF SYSTEMS: Tara Cox is doing well overall. For exercise, she goes to the gym about 3 or four times a week, usually after a doctors appointment. She is bruising more easily than before, which is attributed to her blood thinners. The patient denies unusual headaches, visual changes, nausea, vomiting, or dizziness. There has been no unusual cough, phlegm production, or pleurisy. This been no change in bowel or bladder habits. The patient denies unexplained fatigue or unexplained weight loss, bleeding, rash, or fever. A detailed review of systems was otherwise noncontributory.    PAST MEDICAL HISTORY: Past Medical History:  Diagnosis Date  .  A-fib ([place])   . Arthritis   . Chondromalacia,  patella 12/08/2015  . DDD (degenerative disc disease), lumbar 12/08/2015   S/P discectomy   . Diaphragmatic hernia 12/08/2015  . Dysrhythmia    A-Fib  . Edema of lower extremity 06/29/09   Lower Venous Exam- normal. No evidence of thrombus or thrombophlebitis.  . Esophagitis 12/08/2015  . Frequent urination at night   . GERD (gastroesophageal reflux disease)   . Hemorrhoids   . History of calcium pyrophosphate deposition disease (CPPD) 12/08/2015  . History of miscarriage 12/08/2015   x3  . History of total right knee replacement 12/08/2015  . Hypertension 02/24/09   Echo-EF 62%; Myocardial Perfusion Study-Normal. No signifcant ischemia noted.  . IBS (irritable bowel syndrome)   . Migraine   . Osteoarthritis of both feet 12/08/2015  . Osteoarthritis of both hands 12/08/2015  . PONV (postoperative nausea and vomiting)   . Uterine fibroid 12/08/2015    PAST SURGICAL HISTORY: Past Surgical History:  Procedure Laterality Date  . ABDOMINAL HYSTERECTOMY  1974  . APPENDECTOMY    . BACK SURGERY  2001  . BUNIONECTOMY  1986  . CESAREAN SECTION  1971  . COLONOSCOPY    . COLONOSCOPY WITH PROPOFOL N/A 05/28/2014   Procedure: COLONOSCOPY WITH PROPOFOL;  Surgeon: Carol Ada, MD;  Location: [facility];  Service: Endoscopy;  Laterality: N/A;  . ESOPHAGOGASTRODUODENOSCOPY (EGD) WITH PROPOFOL N/A 05/28/2014   Procedure: ESOPHAGOGASTRODUODENOSCOPY (EGD) WITH PROPOFOL;  Surgeon: Carol Ada, MD;  Location: [facility];  Service: Endoscopy;  Laterality: N/A;  . EXPLORATORY LAPAROTOMY     open procedure  . EYE SURGERY Bilateral    cataract removal  . PORTACATH PLACEMENT Right 05/07/2017   Procedure: INSERTION PORT-A-CATH;  Surgeon: Erroll Luna, MD;  Location: [place];  Service: General;  Laterality: Right;  . SHOULDER SURGERY Right   . SIMPLE MASTECTOMY WITH AXILLARY SENTINEL NODE BIOPSY Right 05/07/2017   Procedure: RIGHT SIMPLE MASTECTOMY WITH AXILLARY SENTINEL NODE BIOPSY;  Surgeon:  Erroll Luna, MD;  Location: [place];  Service: General;  Laterality: Right;  . TOTAL KNEE ARTHROPLASTY Right 01/26/2013   DR Ronnie Derby  . TOTAL KNEE ARTHROPLASTY Right 01/26/2013   Procedure: TOTAL KNEE ARTHROPLASTY;  Surgeon: Vickey Huger, MD;  Location: [place];  Service: Orthopedics;  Laterality: Right;  . UTERINE FIBROID SURGERY  1967    FAMILY HISTORY Family History  Problem Relation Age of Onset  . Other Mother   . Varicose Veins Mother   . Emphysema Mother   . Heart disease Father        heart attack  . Diabetes Sister   . Other Sister        pacemaker  . Diabetes Sister   . Heart disease Sister    The patient's father died in 84 (around age 88) in [place] due to sudden death. The patient's mother died around age 7 due to emphysema. The patient was born in [place], [place]. The patient has 2 brothers and 3 sisters. One sister had a lumpectomy in 1991, but this was not malignant. Another sister died of pancreatic cancer. The patient denies a family history of breast or ovarian cancer.    GYNECOLOGIC HISTORY:  No LMP recorded. Patient has had a hysterectomy. Menarche: 83 years old Age at first live birth: 83 years old She is GXP1.  Her LMP was at age 27 when she underwent hysterectomy, without salpingo-oophorectomy. She was on Premarin for more than 20 years.  She never used oral contraceptives.  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 Her husband, Broadus John, used to be in banking, and he is currently retired. The patient's biological daughter, Clarene Critchley, is a Health and safety inspector at a contact center in [place]. The patient's adopted daughter, Lenna Sciara, is an Social research officer, government in [place]. The patient's adopted son, Gaspar Bidding, is unemployed in [place]. The patient has 8 grandchildren, one of whom belongs to [place] and works as a Pharmacist, community.  The patient belongs to [place].      ADVANCED DIRECTIVES:      HEALTH MAINTENANCE: Social History   Tobacco Use  . Smoking status: Former Smoker    Packs/day: 1.00    Years: 20.00    Pack years: 20.00    Types: Cigarettes    Last attempt to quit: 02/19/1990    Years since quitting: 27.8  . Smokeless tobacco: Never Used  . Tobacco comment: ' i QUIT SMOKING MANY YEARS AGO "  Substance Use Topics  . Alcohol use: No  . Drug use: Never     Colonoscopy: Dr. Benson Norway 2016  PAP:  Bone density:   Allergies  Allergen Reactions  . Oxycodone Itching, Nausea And Vomiting and Other (See Comments)    Full body weakness   . Hydrocodone Other (See Comments)    Reports they make her feel sick/drowsy  . Percocet [Oxycodone-Acetaminophen] Itching    Pt says she can take tylenol   . Soma [Carisoprodol] Nausea And Vomiting  . Ultram [Tramadol Hcl] Nausea And Vomiting    Current Outpatient Medications  Medication Sig Dispense Refill  . acetaminophen (TYLENOL) 650 MG CR tablet Take 650 mg by mouth every 8 (eight) hours as needed for pain.    Marland Kitchen anastrozole (ARIMIDEX) 1 MG tablet Take 1 tablet (1 mg total) by mouth daily. 90 tablet 4  . Ascorbic Acid (VITAMIN C WITH ROSE HIPS) 500 MG tablet Take 500 mg by mouth daily.    Marland Kitchen aspirin-sod bicarb-citric acid (ALKA-SELTZER) 325 MG TBEF tablet Take 325 mg by mouth every 6 (six) hours as needed.    Marland Kitchen atorvastatin (LIPITOR) 20 MG tablet Take 20 mg by mouth at bedtime.     . benzonatate (TESSALON) 200 MG capsule Take 200 mg by mouth 3 (three) times daily as needed for cough.    . Calcium Carbonate-Vit D-Min (CALCIUM 600+D3 PLUS MINERALS) 600-800 MG-UNIT TABS Take 1 tablet by mouth at bedtime.    . Cholecalciferol (VITAMIN D) 2000 units CAPS Take 2,000 Units by mouth daily. Taking Vitamin D 3 2000 units daliy    . colchicine 0.6 MG tablet TAKE 1 TABLET DAILY BY  MOUTH. 90 tablet 1  . docusate sodium (COLACE) 250 MG capsule Take 250 mg by mouth daily as needed for constipation.    . furosemide (LASIX) 20 MG tablet  Take 20 mg by mouth daily.      Marland Kitchen gabapentin (NEURONTIN) 300 MG capsule Take 300 mg by mouth 2 (two) times daily.     Marland Kitchen HYDROcodone-acetaminophen (NORCO/VICODIN) 5-325 MG tablet Take 1 tablet by mouth every 6 (six) hours as needed for moderate pain. 20 tablet 0  . hydrocortisone (ANUSOL-HC) 2.5 % rectal cream Place 1 application rectally 2 (two) times daily as needed for hemorrhoids or itching.    . hydroxychloroquine (PLAQUENIL) 200 MG tablet TAKE 1 TABLET BY MOUTH DAILY 30 tablet 0  . lidocaine-prilocaine (EMLA) cream Apply 1 application topically as needed. 30 g 0  .  losartan-hydrochlorothiazide (HYZAAR) 100-25 MG per tablet Take 1 tablet by mouth daily. IN THE MORNING.    . magnesium oxide (MAG-OX) 400 MG tablet Take 400 mg by mouth daily.     . metoprolol succinate (TOPROL-XL) 25 MG 24 hr tablet Take 1 tablet (25 mg total) by mouth daily. Take with or immediately following a meal. 90 tablet 3  . Multiple Vitamin (MULTIVITAMIN) tablet Take 1 tablet by mouth daily. CENTRUM    . NONFORMULARY OR COMPOUNDED ITEM Apply 1-2 g topically 4 (four) times daily as needed for pain. BACLOFEN-DOXEPIN-GABAPENTIN-TOPIRAMATE-PENTOXIFYLLINE  2  . omeprazole (PRILOSEC) 40 MG capsule Take 40 mg by mouth daily as needed (FOR ACID REFLUX/INDIGESTION).   12  . potassium chloride SA (K-DUR,KLOR-CON) 20 MEQ tablet Take 10 mEq by mouth daily.    . Trastuzumab (HERCEPTIN IV) Inject into the vein every 21 ( twenty-one) days.    Marland Kitchen triamcinolone cream (KENALOG) 0.1 % Apply 1 application topically 2 (two) times daily as needed (Vaginal Itching).    . warfarin (COUMADIN) 5 MG tablet Take 0.5-1 tablets (2.5-5 mg total) by mouth daily. Take 5 mg tablet daily or as directed by pharmacist from [facility] (Patient taking differently: Take 5 mg by mouth at bedtime. Taking according to doctors instructions after each lab check dosages vary. Clarify 09-30-17 Family  Dr. Kathryne Eriksson  orders lab draws for coumadin presently  taking one and a half tablets  To equal 7.5 mg Mondays and Wednesdays all others days of the week she takes 5 mg of the coumadin.) 40 tablet 1   No current facility-administered medications for this visit.     OBJECTIVE: Older African-American woman in no acute distress  Vitals:   12/17/17 1121  BP: (!) 141/55  Pulse: 71  Resp: 18  Temp: 98.6 F (37 C)  SpO2: 98%     Body mass index is 36.27 kg/m.   Wt Readings from Last 3 Encounters:  12/17/17 231 lb 9.6 oz (105.1 kg)  11/22/17 227 lb 12.8 oz (103.3 kg)  11/13/17 229 lb 9.6 oz (104.1 kg)      ECOG FS:1 - Symptomatic but completely ambulatory  Sclerae unicteric, pupils round and equal Oropharynx clear and moist No cervical or supraclavicular adenopathy Lungs no rales or rhonchi Heart regular rate and rhythm Abd soft, nontender, positive bowel sounds MSK no focal spinal tenderness, no upper extremity lymphedema Neuro: nonfocal, well oriented, appropriate affect Breasts: The right breast is status post mastectomy followed by radiation.  There is some hyperpigmentation but no evidence of chest wall recurrence.  Left breast is benign.  Both axillae are benign.  LAB RESULTS:  CMP     Component Value Date/Time   NA 143 12/17/2017 1027   NA 142 09/22/2015   K 3.5 12/17/2017 1027   CL 105 12/17/2017 1027   CO2 30 12/17/2017 1027   GLUCOSE 106 (H) 12/17/2017 1027   BUN 23 12/17/2017 1027   BUN 19 09/22/2015   CREATININE 0.88 12/17/2017 1027   CREATININE 0.91 (H) 10/11/2016 1136   CALCIUM 9.4 12/17/2017 1027   PROT 6.3 (L) 12/17/2017 1027   ALBUMIN 3.6 12/17/2017 1027   AST 24 12/17/2017 1027   ALT 27 12/17/2017 1027   ALKPHOS 77 12/17/2017 1027   BILITOT 0.5 12/17/2017 1027   GFRNONAA >60 12/17/2017 1027   GFRNONAA 60 10/11/2016 1136   GFRAA >60 12/17/2017 1027   GFRAA 69 10/11/2016 1136    No results found for: TOTALPROTELP, ALBUMINELP, A1GS, A2GS,  BETS, BETA2SER, GAMS, MSPIKE, SPEI  No results found for:  KPAFRELGTCHN, LAMBDASER, KAPLAMBRATIO  Lab Results  Component Value Date   WBC 3.5 (L) 12/17/2017   NEUTROABS 1.6 (L) 12/17/2017   HGB 12.3 12/17/2017   HCT 40.0 12/17/2017   MCV 85.8 12/17/2017   PLT 159 12/17/2017    '@LASTCHEMISTRY'$ @  No results found for: LABCA2  No components found for: CHENID782  No results for input(s): INR in the last 168 hours.  No results found for: LABCA2  No results found for: UMP536  No results found for: RWE315  No results found for: QMG867  No results found for: CA2729  No components found for: HGQUANT  No results found for: CEA1 / No results found for: CEA1   No results found for: AFPTUMOR  No results found for: CHROMOGRNA  No results found for: PSA1  Appointment on 12/17/2017  Component Date Value Ref Range Status  . Sodium 12/17/2017 143  135 - 145 mmol/L Final  . Potassium 12/17/2017 3.5  3.5 - 5.1 mmol/L Final  . Chloride 12/17/2017 105  98 - 111 mmol/L Final  . CO2 12/17/2017 30  22 - 32 mmol/L Final  . Glucose, Bld 12/17/2017 106* 70 - 99 mg/dL Final  . BUN 12/17/2017 23  8 - 23 mg/dL Final  . Creatinine, Ser 12/17/2017 0.88  0.44 - 1.00 mg/dL Final  . Calcium 12/17/2017 9.4  8.9 - 10.3 mg/dL Final  . Total Protein 12/17/2017 6.3* 6.5 - 8.1 g/dL Final  . Albumin 12/17/2017 3.6  3.5 - 5.0 g/dL Final  . AST 12/17/2017 24  15 - 41 U/L Final  . ALT 12/17/2017 27  0 - 44 U/L Final  . Alkaline Phosphatase 12/17/2017 77  38 - 126 U/L Final  . Total Bilirubin 12/17/2017 0.5  0.3 - 1.2 mg/dL Final  . GFR calc non Af Amer 12/17/2017 >60  >60 mL/min Final  . GFR calc Af Amer 12/17/2017 >60  >60 mL/min Final  . Anion gap 12/17/2017 8  5 - 15 Final   Performed at [facility], [place] [address] [place], [place] [address]  . WBC 12/17/2017 3.5* 4.0 - 10.5 K/uL Final  . RBC 12/17/2017 4.66  3.87 - 5.11 MIL/uL Final  . Hemoglobin 12/17/2017 12.3  12.0 - 15.0 g/dL Final  . HCT 12/17/2017 40.0  36.0 - 46.0 %  Final  . MCV 12/17/2017 85.8  80.0 - 100.0 fL Final  . MCH 12/17/2017 26.4  26.0 - 34.0 pg Final  . MCHC 12/17/2017 30.8  30.0 - 36.0 g/dL Final  . RDW 12/17/2017 14.5  11.5 - 15.5 % Final  . Platelets 12/17/2017 159  150 - 400 K/uL Final  . nRBC 12/17/2017 0.0  0.0 - 0.2 % Final  . Neutrophils Relative % 12/17/2017 46  % Final  . Neutro Abs 12/17/2017 1.6* 1.7 - 7.7 K/uL Final  . Lymphocytes Relative 12/17/2017 40  % Final  . Lymphs Abs 12/17/2017 1.4  0.7 - 4.0 K/uL Final  . Monocytes Relative 12/17/2017 11  % Final  . Monocytes Absolute 12/17/2017 0.4  0.1 - 1.0 K/uL Final  . Eosinophils Relative 12/17/2017 2  % Final  . Eosinophils Absolute 12/17/2017 0.1  0.0 - 0.5 K/uL Final  . Basophils Relative 12/17/2017 1  % Final  . Basophils Absolute 12/17/2017 0.0  0.0 - 0.1 K/uL Final  . Immature Granulocytes 12/17/2017 0  % Final  . Abs Immature Granulocytes 12/17/2017 0.00  0.00 -  0.07 K/uL Final   Performed at [facility], [place] [address] [place], [place] [address]    (this displays the last labs from the last 3 days)  No results found for: TOTALPROTELP, ALBUMINELP, A1GS, A2GS, BETS, BETA2SER, GAMS, MSPIKE, SPEI (this displays SPEP labs)  No results found for: KPAFRELGTCHN, LAMBDASER, KAPLAMBRATIO (kappa/lambda light chains)  No results found for: HGBA, HGBA2QUANT, HGBFQUANT, HGBSQUAN (Hemoglobinopathy evaluation)   Lab Results  Component Value Date   LDH 162 10/22/2006    No results found for: IRON, TIBC, IRONPCTSAT (Iron and TIBC)  No results found for: FERRITIN  Urinalysis    Component Value Date/Time   COLORURINE YELLOW 09/27/2015 [place] 09/27/2015 1532   LABSPEC 1.015 09/27/2015 1532   PHURINE 6.5 09/27/2015 1532   GLUCOSEU NEGATIVE 09/27/2015 1532   HGBUR TRACE (A) 09/27/2015 1532   BILIRUBINUR NEGATIVE 09/27/2015 1532   KETONESUR NEGATIVE 09/27/2015 1532   PROTEINUR NEGATIVE 09/27/2015 1532   UROBILINOGEN 0.2  01/19/2013 0954   NITRITE NEGATIVE 09/27/2015 1532   LEUKOCYTESUR NEGATIVE 09/27/2015 1532     STUDIES: Echo on bone density results discussed with the patient  ELIGIBLE FOR AVAILABLE RESEARCH PROTOCOL: Exact sciences study  ASSESSMENT: 83 y.o. [place] woman status post central right breast biopsy 03/29/2017 for a clinical T1c N0, stage IA invasive lobular carcinoma, grade 2, strongly estrogen and progesterone receptor positive, also HER-2 amplified, with an MIB-1 of 25%  (1) status post right mastectomy with sentinel lymph node sampling 05/07/2017 for a pT3 pN1, stage IIB invasive lobular carcinoma, grade 3, with negative margins.  (2) 07/02/2017 - 08/16/2017 Site/dose:   The patient initially received a dose of 50.4 Gy in 28 fractions to the right chest wall and supraclavicular region. This was delivered using a 3-D conformal, 4 field technique. The patient then received a boost to the mastectomy scar. This delivered an additional 10 Gy in 5 fractions using an en face electron field. The total dose was 60.4 Gy.  (3) trastuzumab adjuvantly for 1 year to start 06/04/2017  (a) echocardiogram 04/16/2017 shows an ejection fraction of 60-65%.  (b) echocardiogram 08/21/2017 shows an ejection fraction in the 60-65% range  (c) echo 11/22/2017 shows an ejection fraction in the 60-65% range  (4) anastrozole started 05/28/2017  (a) bone density 10/31/2017 shows a T score of -1.5  (5) small seroma right axilla, requiring follow-up  PLAN: Tara Cox is tolerating treatments remarkably well and the plan will be to continue the Herceptin through May and of course the anastrozole for a total of 5 years.  I think the discomfort that she experiences on her back is going to be due to osteoarthritis since it improves with stretching and exercise.  I encouraged her to continue to go to the gym frequently and to be very persistent but not very intense with her exercises  She maintains an excellent  ejection fraction.  She has mild osteopenia.  This does not require any other intervention at present  We are proceeding with treatment and she will return to see me in March.  She knows to call for any other issues that may develop before the next visit here.   Magrinat, Virgie Dad, MD  12/17/17 11:56 AM Medical Oncology and Hematology [facility] [address] [place], [place] [address] Tel. [phone]    Fax. [phone]   I, Jacqualyn Posey am acting as a Education administrator for Chauncey Cruel, MD.   I, Lurline Del MD, have  reviewed the above documentation for accuracy and completeness, and I agree with the above.

## 2017-12-17 ENCOUNTER — Other Ambulatory Visit: Payer: Medicare Other

## 2017-12-17 ENCOUNTER — Inpatient Hospital Stay: Payer: Medicare Other

## 2017-12-17 ENCOUNTER — Inpatient Hospital Stay: Payer: Medicare Other | Attending: Oncology

## 2017-12-17 ENCOUNTER — Inpatient Hospital Stay (HOSPITAL_BASED_OUTPATIENT_CLINIC_OR_DEPARTMENT_OTHER): Payer: Medicare Other | Admitting: Oncology

## 2017-12-17 VITALS — BP 141/55 | HR 71 | Temp 98.6°F | Resp 18 | Ht 67.0 in | Wt 231.6 lb

## 2017-12-17 DIAGNOSIS — C50111 Malignant neoplasm of central portion of right female breast: Secondary | ICD-10-CM | POA: Insufficient documentation

## 2017-12-17 DIAGNOSIS — Z170 Estrogen receptor positive status [ER+]: Secondary | ICD-10-CM

## 2017-12-17 DIAGNOSIS — Z5112 Encounter for antineoplastic immunotherapy: Secondary | ICD-10-CM | POA: Diagnosis not present

## 2017-12-17 DIAGNOSIS — Z79811 Long term (current) use of aromatase inhibitors: Secondary | ICD-10-CM

## 2017-12-17 DIAGNOSIS — Z87891 Personal history of nicotine dependence: Secondary | ICD-10-CM

## 2017-12-17 DIAGNOSIS — Z7901 Long term (current) use of anticoagulants: Secondary | ICD-10-CM

## 2017-12-17 DIAGNOSIS — I4891 Unspecified atrial fibrillation: Secondary | ICD-10-CM

## 2017-12-17 DIAGNOSIS — Z9011 Acquired absence of right breast and nipple: Secondary | ICD-10-CM

## 2017-12-17 DIAGNOSIS — C50911 Malignant neoplasm of unspecified site of right female breast: Secondary | ICD-10-CM

## 2017-12-17 DIAGNOSIS — I10 Essential (primary) hypertension: Secondary | ICD-10-CM | POA: Diagnosis not present

## 2017-12-17 DIAGNOSIS — Z95828 Presence of other vascular implants and grafts: Secondary | ICD-10-CM

## 2017-12-17 DIAGNOSIS — M8580 Other specified disorders of bone density and structure, unspecified site: Secondary | ICD-10-CM

## 2017-12-17 LAB — CBC WITH DIFFERENTIAL/PLATELET
ABS IMMATURE GRANULOCYTES: 0 10*3/uL (ref 0.00–0.07)
BASOS ABS: 0 10*3/uL (ref 0.0–0.1)
Basophils Relative: 1 %
Eosinophils Absolute: 0.1 10*3/uL (ref 0.0–0.5)
Eosinophils Relative: 2 %
HEMATOCRIT: 40 % (ref 36.0–46.0)
HEMOGLOBIN: 12.3 g/dL (ref 12.0–15.0)
IMMATURE GRANULOCYTES: 0 %
LYMPHS ABS: 1.4 10*3/uL (ref 0.7–4.0)
LYMPHS PCT: 40 %
MCH: 26.4 pg (ref 26.0–34.0)
MCHC: 30.8 g/dL (ref 30.0–36.0)
MCV: 85.8 fL (ref 80.0–100.0)
Monocytes Absolute: 0.4 10*3/uL (ref 0.1–1.0)
Monocytes Relative: 11 %
NEUTROS PCT: 46 %
NRBC: 0 % (ref 0.0–0.2)
Neutro Abs: 1.6 10*3/uL — ABNORMAL LOW (ref 1.7–7.7)
Platelets: 159 10*3/uL (ref 150–400)
RBC: 4.66 MIL/uL (ref 3.87–5.11)
RDW: 14.5 % (ref 11.5–15.5)
WBC: 3.5 10*3/uL — ABNORMAL LOW (ref 4.0–10.5)

## 2017-12-17 LAB — COMPREHENSIVE METABOLIC PANEL
ALBUMIN: 3.6 g/dL (ref 3.5–5.0)
ALT: 27 U/L (ref 0–44)
ANION GAP: 8 (ref 5–15)
AST: 24 U/L (ref 15–41)
Alkaline Phosphatase: 77 U/L (ref 38–126)
BUN: 23 mg/dL (ref 8–23)
CHLORIDE: 105 mmol/L (ref 98–111)
CO2: 30 mmol/L (ref 22–32)
CREATININE: 0.88 mg/dL (ref 0.44–1.00)
Calcium: 9.4 mg/dL (ref 8.9–10.3)
GFR calc non Af Amer: >60 mL/min (ref >60)
Glucose, Bld: 106 mg/dL — ABNORMAL HIGH (ref 70–99)
Potassium: 3.5 mmol/L (ref 3.5–5.1)
SODIUM: 143 mmol/L (ref 135–145)
Total Bilirubin: 0.5 mg/dL (ref 0.3–1.2)
Total Protein: 6.3 g/dL — ABNORMAL LOW (ref 6.5–8.1)

## 2017-12-17 MED ORDER — TRASTUZUMAB CHEMO 150 MG IV SOLR
6.0000 mg/kg | Freq: Once | INTRAVENOUS | Status: AC
  Administered 2017-12-17: 651 mg via INTRAVENOUS
  Filled 2017-12-17: qty 31

## 2017-12-17 MED ORDER — SODIUM CHLORIDE 0.9% FLUSH
10.0000 mL | INTRAVENOUS | Status: DC | PRN
  Administered 2017-12-17: 10 mL
  Filled 2017-12-17: qty 10

## 2017-12-17 MED ORDER — DIPHENHYDRAMINE HCL 25 MG PO CAPS
25.0000 mg | ORAL_CAPSULE | Freq: Once | ORAL | Status: AC
  Administered 2017-12-17: 25 mg via ORAL

## 2017-12-17 MED ORDER — ACETAMINOPHEN 325 MG PO TABS
650.0000 mg | ORAL_TABLET | Freq: Once | ORAL | Status: AC
  Administered 2017-12-17: 650 mg via ORAL

## 2017-12-17 MED ORDER — DIPHENHYDRAMINE HCL 25 MG PO CAPS
ORAL_CAPSULE | ORAL | Status: AC
  Filled 2017-12-17: qty 1

## 2017-12-17 MED ORDER — HEPARIN SOD (PORK) LOCK FLUSH 100 UNIT/ML IV SOLN
500.0000 [IU] | Freq: Once | INTRAVENOUS | Status: AC | PRN
  Administered 2017-12-17: 500 [IU]
  Filled 2017-12-17: qty 5

## 2017-12-17 MED ORDER — SODIUM CHLORIDE 0.9 % IV SOLN
Freq: Once | INTRAVENOUS | Status: AC
  Administered 2017-12-17: 12:00:00 via INTRAVENOUS
  Filled 2017-12-17: qty 250

## 2017-12-17 MED ORDER — ACETAMINOPHEN 325 MG PO TABS
ORAL_TABLET | ORAL | Status: AC
  Filled 2017-12-17: qty 2

## 2017-12-17 NOTE — Patient Instructions (Signed)
[facility] Discharge Instructions for Patients Receiving Chemotherapy  Today you received the following chemotherapy agents Herceptin  To help prevent nausea and vomiting after your treatment, we encourage you to take your nausea medication as directed   If you develop nausea and vomiting that is not controlled by your nausea medication, call the clinic.   BELOW ARE SYMPTOMS THAT SHOULD BE REPORTED IMMEDIATELY:  *FEVER GREATER THAN 100.5 F  *CHILLS WITH OR WITHOUT FEVER  NAUSEA AND VOMITING THAT IS NOT CONTROLLED WITH YOUR NAUSEA MEDICATION  *UNUSUAL SHORTNESS OF BREATH  *UNUSUAL BRUISING OR BLEEDING  TENDERNESS IN MOUTH AND THROAT WITH OR WITHOUT PRESENCE OF ULCERS  *URINARY PROBLEMS  *BOWEL PROBLEMS  UNUSUAL RASH Items with * indicate a potential emergency and should be followed up as soon as possible.  Feel free to call the clinic should you have any questions or concerns. The clinic phone number is [phone].  Please show the CHEMO ALERT CARD at check-in to the Emergency Department and triage nurse.   

## 2018-01-07 ENCOUNTER — Other Ambulatory Visit: Payer: Self-pay | Admitting: Oncology

## 2018-01-07 ENCOUNTER — Inpatient Hospital Stay: Payer: Medicare Other

## 2018-01-07 ENCOUNTER — Other Ambulatory Visit: Payer: Self-pay | Admitting: Adult Health

## 2018-01-07 VITALS — BP 154/69 | HR 70 | Temp 98.6°F | Resp 14

## 2018-01-07 DIAGNOSIS — Z95828 Presence of other vascular implants and grafts: Secondary | ICD-10-CM

## 2018-01-07 DIAGNOSIS — C50911 Malignant neoplasm of unspecified site of right female breast: Secondary | ICD-10-CM

## 2018-01-07 DIAGNOSIS — C50111 Malignant neoplasm of central portion of right female breast: Secondary | ICD-10-CM

## 2018-01-07 DIAGNOSIS — Z170 Estrogen receptor positive status [ER+]: Secondary | ICD-10-CM

## 2018-01-07 DIAGNOSIS — Z5112 Encounter for antineoplastic immunotherapy: Secondary | ICD-10-CM | POA: Diagnosis not present

## 2018-01-07 DIAGNOSIS — M542 Cervicalgia: Secondary | ICD-10-CM

## 2018-01-07 LAB — COMPREHENSIVE METABOLIC PANEL
ALT: 35 U/L (ref 0–44)
AST: 26 U/L (ref 15–41)
Albumin: 3.7 g/dL (ref 3.5–5.0)
Alkaline Phosphatase: 83 U/L (ref 38–126)
Anion gap: 10 (ref 5–15)
BUN: 20 mg/dL (ref 8–23)
CO2: 30 mmol/L (ref 22–32)
Calcium: 9.8 mg/dL (ref 8.9–10.3)
Chloride: 103 mmol/L (ref 98–111)
Creatinine, Ser: 0.88 mg/dL (ref 0.44–1.00)
GFR calc Af Amer: >60 mL/min (ref >60)
GFR calc non Af Amer: >60 mL/min (ref >60)
Glucose, Bld: 96 mg/dL (ref 70–99)
Potassium: 3.6 mmol/L (ref 3.5–5.1)
Sodium: 143 mmol/L (ref 135–145)
Total Bilirubin: 0.6 mg/dL (ref 0.3–1.2)
Total Protein: 6.7 g/dL (ref 6.5–8.1)

## 2018-01-07 LAB — CBC WITH DIFFERENTIAL/PLATELET
Abs Immature Granulocytes: 0.01 10*3/uL (ref 0.00–0.07)
BASOS ABS: 0 10*3/uL (ref 0.0–0.1)
Basophils Relative: 1 %
Eosinophils Absolute: 0 10*3/uL (ref 0.0–0.5)
Eosinophils Relative: 1 %
HCT: 43.3 % (ref 36.0–46.0)
Hemoglobin: 13.7 g/dL (ref 12.0–15.0)
Immature Granulocytes: 0 %
Lymphocytes Relative: 36 %
Lymphs Abs: 1.5 10*3/uL (ref 0.7–4.0)
MCH: 26.6 pg (ref 26.0–34.0)
MCHC: 31.6 g/dL (ref 30.0–36.0)
MCV: 83.9 fL (ref 80.0–100.0)
Monocytes Absolute: 0.4 10*3/uL (ref 0.1–1.0)
Monocytes Relative: 10 %
NRBC: 0 % (ref 0.0–0.2)
Neutro Abs: 2.1 10*3/uL (ref 1.7–7.7)
Neutrophils Relative %: 52 %
Platelets: 148 10*3/uL — ABNORMAL LOW (ref 150–400)
RBC: 5.16 MIL/uL — ABNORMAL HIGH (ref 3.87–5.11)
RDW: 14.4 % (ref 11.5–15.5)
WBC: 4 10*3/uL (ref 4.0–10.5)

## 2018-01-07 MED ORDER — SODIUM CHLORIDE 0.9 % IV SOLN
Freq: Once | INTRAVENOUS | Status: AC
  Administered 2018-01-07: 12:00:00 via INTRAVENOUS
  Filled 2018-01-07: qty 250

## 2018-01-07 MED ORDER — HEPARIN SOD (PORK) LOCK FLUSH 100 UNIT/ML IV SOLN
500.0000 [IU] | Freq: Once | INTRAVENOUS | Status: AC | PRN
  Administered 2018-01-07: 500 [IU]
  Filled 2018-01-07: qty 5

## 2018-01-07 MED ORDER — ACETAMINOPHEN 325 MG PO TABS
650.0000 mg | ORAL_TABLET | Freq: Once | ORAL | Status: AC
  Administered 2018-01-07: 650 mg via ORAL

## 2018-01-07 MED ORDER — DIPHENHYDRAMINE HCL 25 MG PO CAPS
ORAL_CAPSULE | ORAL | Status: AC
  Filled 2018-01-07: qty 1

## 2018-01-07 MED ORDER — TRASTUZUMAB CHEMO 150 MG IV SOLR
6.0000 mg/kg | Freq: Once | INTRAVENOUS | Status: AC
  Administered 2018-01-07: 651 mg via INTRAVENOUS
  Filled 2018-01-07: qty 31

## 2018-01-07 MED ORDER — ACETAMINOPHEN 325 MG PO TABS
ORAL_TABLET | ORAL | Status: AC
  Filled 2018-01-07: qty 2

## 2018-01-07 MED ORDER — SODIUM CHLORIDE 0.9% FLUSH
10.0000 mL | INTRAVENOUS | Status: DC | PRN
  Administered 2018-01-07 (×2): 10 mL
  Filled 2018-01-07: qty 10

## 2018-01-07 MED ORDER — DIPHENHYDRAMINE HCL 25 MG PO CAPS
25.0000 mg | ORAL_CAPSULE | Freq: Once | ORAL | Status: AC
  Administered 2018-01-07: 25 mg via ORAL

## 2018-01-07 NOTE — Progress Notes (Signed)
Mrs. Tara Cox in infusion today c/o an uncomfortable "whooshing" (states it feels like a rush of fluid) going up the side of her left neck. No dizziness, SOB, chest tightness or anything affiliated with this, says it doesn't hurt and only happens occasionally. Is not tender to palpate, but it "feels different." Nothing abnormal upon palpation/inspection, no stridor noted upon auscultation. Dr. Jana Hakim will see pt in infusion.

## 2018-01-07 NOTE — Patient Instructions (Signed)
[facility] Discharge Instructions for Patients Receiving Chemotherapy  Today you received the following chemotherapy agents Herceptin  To help prevent nausea and vomiting after your treatment, we encourage you to take your nausea medication as directed   If you develop nausea and vomiting that is not controlled by your nausea medication, call the clinic.   BELOW ARE SYMPTOMS THAT SHOULD BE REPORTED IMMEDIATELY:  *FEVER GREATER THAN 100.5 F  *CHILLS WITH OR WITHOUT FEVER  NAUSEA AND VOMITING THAT IS NOT CONTROLLED WITH YOUR NAUSEA MEDICATION  *UNUSUAL SHORTNESS OF BREATH  *UNUSUAL BRUISING OR BLEEDING  TENDERNESS IN MOUTH AND THROAT WITH OR WITHOUT PRESENCE OF ULCERS  *URINARY PROBLEMS  *BOWEL PROBLEMS  UNUSUAL RASH Items with * indicate a potential emergency and should be followed up as soon as possible.  Feel free to call the clinic should you have any questions or concerns. The clinic phone number is [phone].  Please show the CHEMO ALERT CARD at check-in to the Emergency Department and triage nurse.   

## 2018-01-15 ENCOUNTER — Other Ambulatory Visit: Payer: Self-pay | Admitting: Adult Health

## 2018-01-15 DIAGNOSIS — M542 Cervicalgia: Secondary | ICD-10-CM

## 2018-01-17 ENCOUNTER — Ambulatory Visit (HOSPITAL_BASED_OUTPATIENT_CLINIC_OR_DEPARTMENT_OTHER)
Admission: RE | Admit: 2018-01-17 | Discharge: 2018-01-17 | Disposition: A | Discharge status: Home / Self Care | Payer: Medicare Other | Source: Ambulatory Visit | Attending: Adult Health | Admitting: Adult Health

## 2018-01-17 ENCOUNTER — Ambulatory Visit
Admission: RE | Admit: 2018-01-17 | Discharge: 2018-01-17 | Disposition: A | Discharge status: Home / Self Care | Payer: Medicare Other | Source: Ambulatory Visit | Attending: Adult Health | Admitting: Adult Health

## 2018-01-17 ENCOUNTER — Other Ambulatory Visit: Payer: Self-pay | Admitting: Adult Health

## 2018-01-17 ENCOUNTER — Inpatient Hospital Stay: Payer: Medicare Other | Attending: Oncology | Admitting: Adult Health

## 2018-01-17 ENCOUNTER — Encounter: Payer: Self-pay | Admitting: Adult Health

## 2018-01-17 ENCOUNTER — Ambulatory Visit (HOSPITAL_COMMUNITY)
Admission: RE | Admit: 2018-01-17 | Discharge: 2018-01-17 | Disposition: A | Discharge status: Home / Self Care | Payer: Medicare Other | Source: Ambulatory Visit | Attending: Adult Health | Admitting: Adult Health

## 2018-01-17 VITALS — BP 139/73 | HR 85 | Temp 98.5°F | Resp 18 | Ht 67.0 in | Wt 230.8 lb

## 2018-01-17 DIAGNOSIS — I82722 Chronic embolism and thrombosis of deep veins of left upper extremity: Secondary | ICD-10-CM | POA: Insufficient documentation

## 2018-01-17 DIAGNOSIS — I4891 Unspecified atrial fibrillation: Secondary | ICD-10-CM | POA: Diagnosis not present

## 2018-01-17 DIAGNOSIS — Z87891 Personal history of nicotine dependence: Secondary | ICD-10-CM

## 2018-01-17 DIAGNOSIS — M542 Cervicalgia: Secondary | ICD-10-CM | POA: Diagnosis not present

## 2018-01-17 DIAGNOSIS — Z79811 Long term (current) use of aromatase inhibitors: Secondary | ICD-10-CM | POA: Insufficient documentation

## 2018-01-17 DIAGNOSIS — Z170 Estrogen receptor positive status [ER+]: Secondary | ICD-10-CM | POA: Diagnosis not present

## 2018-01-17 DIAGNOSIS — C50111 Malignant neoplasm of central portion of right female breast: Secondary | ICD-10-CM

## 2018-01-17 DIAGNOSIS — Z7901 Long term (current) use of anticoagulants: Secondary | ICD-10-CM | POA: Insufficient documentation

## 2018-01-17 DIAGNOSIS — Z9011 Acquired absence of right breast and nipple: Secondary | ICD-10-CM | POA: Diagnosis not present

## 2018-01-17 DIAGNOSIS — I480 Paroxysmal atrial fibrillation: Secondary | ICD-10-CM

## 2018-01-17 DIAGNOSIS — Z923 Personal history of irradiation: Secondary | ICD-10-CM | POA: Insufficient documentation

## 2018-01-17 DIAGNOSIS — R2231 Localized swelling, mass and lump, right upper limb: Secondary | ICD-10-CM

## 2018-01-17 DIAGNOSIS — N6489 Other specified disorders of breast: Secondary | ICD-10-CM | POA: Diagnosis not present

## 2018-01-17 DIAGNOSIS — I82B12 Acute embolism and thrombosis of left subclavian vein: Secondary | ICD-10-CM | POA: Insufficient documentation

## 2018-01-17 DIAGNOSIS — C50911 Malignant neoplasm of unspecified site of right female breast: Secondary | ICD-10-CM

## 2018-01-17 DIAGNOSIS — M0609 Rheumatoid arthritis without rheumatoid factor, multiple sites: Secondary | ICD-10-CM

## 2018-01-17 DIAGNOSIS — I82C12 Acute embolism and thrombosis of left internal jugular vein: Secondary | ICD-10-CM

## 2018-01-17 DIAGNOSIS — Z5112 Encounter for antineoplastic immunotherapy: Secondary | ICD-10-CM | POA: Insufficient documentation

## 2018-01-17 DIAGNOSIS — I82622 Acute embolism and thrombosis of deep veins of left upper extremity: Secondary | ICD-10-CM

## 2018-01-17 DIAGNOSIS — N6310 Unspecified lump in the right breast, unspecified quadrant: Secondary | ICD-10-CM

## 2018-01-17 MED ORDER — ENOXAPARIN SODIUM 150 MG/ML ~~LOC~~ SOLN: 150.0000 mg | SUBCUTANEOUS | 5 refills | Status: DC

## 2018-01-17 NOTE — Progress Notes (Addendum)
[place]  [phone] [phone]     ID: KONSTANCE HAPPEL DOB: [birth year]  MR#: [id number]  JYN#:[id number]  Patient Care Team: [name], MD as PCP - General (Family Medicine) Magrinat, Virgie Dad, MD as Consulting Physician (Oncology) Erroll Luna, MD as Consulting Physician (General Surgery) Kyung Rudd, MD as Consulting Physician (Radiation Oncology) Vickey Huger, MD as Consulting Physician (Orthopedic Surgery) Bo Merino, MD as Consulting Physician (Rheumatology) Lorretta Harp, MD as Consulting Physician (Cardiology) OTHER MD:   CHIEF COMPLAINT: Triple positive breast cancer  CURRENT TREATMENT: Anastrozole, trastuzumab; lovenox  HISTORY OF CURRENT ILLNESS: From the original intake note:  [name] had mammography at the breast center October 11, 2011 showing calcifications in the upper right breast.  Six-month follow-up was suggested, but  the patient did not follow-up.  More recently she noticed nipple inversion on the right breast and followed this without reporting it over the last several months. She did undergo bilateral diagnostic mammography with tomography and right breast ultrasonography at [facility] on 03/26/2017 showing: breast density category B. There is a highly suspicious right retroareolar mass  measuring approximately 2.3 x 1.5 x 2.8 cm  with associated calcifications, together the mass and calcifications measure up to 3.3 cm mammographically. There are 2 lymph nodes in the right axilla with borderline thickened cortices, 1 of which measures 1 x 0.9 x 0.6 cm with a cortex thickness of 0.4 mm.  Accordingly on 03/29/2017 she proceeded to biopsy of the right breast and one axillary lymph node.. The pathology from this procedure showed ([id number]): Invasive lobular carcinoma,grade II, E-cadherin negative. The right axillary lymph node was negative for carcinoma. Prognostic indicators significant for:  estrogen receptor, 100% positive and progesterone receptor, 100% positive, both with strong staining intensity. Proliferation marker Ki67 at 25%. HER2 amplified with ratios  HER2/CEP17 signals 2.22 and average HER2 copies per cell 3.00  The patient's subsequent history is as detailed below.  INTERVAL HISTORY: Raejean returns today for follow-up of her triple positive breast cancer. She is accompanied by her husband.  She underwent two things today.  A LUE doppler that showed a left IJV and subclavian acute DVT, and an ultrasound of the right axilla that showed an area that needs to be biopsied on Monday.    REVIEW OF SYSTEMS: Bailynn has noted this discomfort in her neck that feels like an air inflation that has been going on for about a month and worsening in frequency.    She was started on Warfarin in 2007-2008 due to atrial fibrillation.  Dr. Redmond Pulling manages this with her INR.  Her last INR was 2.9, she has been going more frequently because her INR was supratherapeutic in the upper 3 range.  NO other h/o blood clots.  She denies any chest pain or shortness of breath.    Guillermina has noted a slight increase in headache frequency.  She denies any vision changes, focal weakness, numbness, dysphagia.  She hasn't noted nausea, vomiting, abdominal pain, bowel/bladder changes.  She is without chest pain, palpitations, cough, shortness of breath.  A detailed ROS was otherwise non contributory.     PAST MEDICAL HISTORY: Past Medical History:  Diagnosis Date  . A-fib ([place])   . Arthritis   . 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 DDD (degenerative disc disease), lumbar 12/08/2015   S/P discectomy   . Diaphragmatic hernia 12/08/2015  . Dysrhythmia    A-Fib  . Edema of lower extremity 06/29/09   Lower  Venous Exam- normal. No evidence of thrombus or thrombophlebitis.  . Esophagitis 12/08/2015  . Frequent urination at night   . GERD (gastroesophageal reflux disease)   . Hemorrhoids   . History of  calcium pyrophosphate deposition disease (CPPD) 12/08/2015  . History of miscarriage 12/08/2015   x3  . History of total right knee replacement 12/08/2015  . Hypertension 02/24/09   Echo-EF 62%; Myocardial Perfusion Study-Normal. No signifcant ischemia noted.  . IBS (irritable bowel syndrome)   . Migraine   . Osteoarthritis of both feet 12/08/2015  . Osteoarthritis of both hands 12/08/2015  . PONV (postoperative nausea and vomiting)   . Uterine fibroid 12/08/2015    PAST SURGICAL HISTORY: Past Surgical History:  Procedure Laterality Date  . ABDOMINAL HYSTERECTOMY  1974  . APPENDECTOMY    . BACK SURGERY  2001  . BUNIONECTOMY  1986  . CESAREAN SECTION  1971  . COLONOSCOPY    . COLONOSCOPY WITH PROPOFOL N/A 05/28/2014   Procedure: COLONOSCOPY WITH PROPOFOL;  Surgeon: Carol Ada, MD;  Location: [facility];  Service: Endoscopy;  Laterality: N/A;  . ESOPHAGOGASTRODUODENOSCOPY (EGD) WITH PROPOFOL N/A 05/28/2014   Procedure: ESOPHAGOGASTRODUODENOSCOPY (EGD) WITH PROPOFOL;  Surgeon: Carol Ada, MD;  Location: [facility];  Service: Endoscopy;  Laterality: N/A;  . EXPLORATORY LAPAROTOMY     open procedure  . EYE SURGERY Bilateral    cataract removal  . PORTACATH PLACEMENT Right 05/07/2017   Procedure: INSERTION PORT-A-CATH;  Surgeon: Erroll Luna, MD;  Location: [facility];  Service: General;  Laterality: Right;  . SHOULDER SURGERY Right   . SIMPLE MASTECTOMY WITH AXILLARY SENTINEL NODE BIOPSY Right 05/07/2017   Procedure: RIGHT SIMPLE MASTECTOMY WITH AXILLARY SENTINEL NODE BIOPSY;  Surgeon: Erroll Luna, MD;  Location: [facility];  Service: General;  Laterality: Right;  . TOTAL KNEE ARTHROPLASTY Right 01/26/2013   DR Ronnie Derby  . TOTAL KNEE ARTHROPLASTY Right 01/26/2013   Procedure: TOTAL KNEE ARTHROPLASTY;  Surgeon: Vickey Huger, MD;  Location: [facility];  Service: Orthopedics;  Laterality: Right;  . UTERINE FIBROID SURGERY  1967    FAMILY HISTORY Family History  Problem Relation Age of  Onset  . Other Mother   . Varicose Veins Mother   . Emphysema Mother   . Heart disease Father        heart attack  . Diabetes Sister   . Other Sister        pacemaker  . Diabetes Sister   . Heart disease Sister    The patient's father died in 44 (around age 80) in [place] due to sudden death. The patient's mother died around age 35 due to emphysema. The patient was born in [place], [place]. The patient has 2 brothers and 3 sisters. One sister had a lumpectomy in 1991, but this was not malignant. Another sister died of pancreatic cancer. The patient denies a family history of breast or ovarian cancer.    GYNECOLOGIC HISTORY:  No LMP recorded. Patient has had a hysterectomy. Menarche: 84 years old Age at first live birth: 84 years old She is GXP1.  Her LMP was at age 86 when she underwent hysterectomy, without salpingo-oophorectomy. She was on Premarin for more than 20 years.  She never used oral contraceptives.   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 Her husband, Broadus John, used to be in banking, and he is currently retired. The patient's biological daughter, Clarene Critchley, is a Health and safety inspector at a contact center in [place]. The  patient's adopted daughter, Lenna Sciara, is an Social research officer, government in [place]. The patient's adopted son, Gaspar Bidding, is unemployed in [place]. The patient has 8 grandchildren, one of whom belongs to [place] and works as a Pharmacist, community.  The patient belongs to [place].      ADVANCED DIRECTIVES:    HEALTH MAINTENANCE: Social History   Tobacco Use  . Smoking status: Former Smoker    Packs/day: 1.00    Years: 20.00    Pack years: 20.00    Types: Cigarettes    Last attempt to quit: 02/19/1990    Years since quitting: 27.9  . Smokeless tobacco: Never Used  . Tobacco comment: ' i QUIT SMOKING MANY YEARS AGO "  Substance Use Topics  . Alcohol use: No  . Drug use: Never      Colonoscopy: Dr. Benson Norway 2016  PAP:  Bone density:   Allergies  Allergen Reactions  . Oxycodone Itching, Nausea And Vomiting and Other (See Comments)    Full body weakness   . Hydrocodone Other (See Comments)    Reports they make her feel sick/drowsy  . Percocet [Oxycodone-Acetaminophen] Itching    Pt says she can take tylenol   . Soma [Carisoprodol] Nausea And Vomiting  . Ultram [Tramadol Hcl] Nausea And Vomiting    Current Outpatient Medications  Medication Sig Dispense Refill  . acetaminophen (TYLENOL) 650 MG CR tablet Take 650 mg by mouth every 8 (eight) hours as needed for pain.    Marland Kitchen anastrozole (ARIMIDEX) 1 MG tablet Take 1 tablet (1 mg total) by mouth daily. 90 tablet 4  . Ascorbic Acid (VITAMIN C WITH ROSE HIPS) 500 MG tablet Take 500 mg by mouth daily.    Marland Kitchen aspirin-sod bicarb-citric acid (ALKA-SELTZER) 325 MG TBEF tablet Take 325 mg by mouth every 6 (six) hours as needed.    Marland Kitchen atorvastatin (LIPITOR) 20 MG tablet Take 20 mg by mouth at bedtime.     . benzonatate (TESSALON) 200 MG capsule Take 200 mg by mouth 3 (three) times daily as needed for cough.    . Calcium Carbonate-Vit D-Min (CALCIUM 600+D3 PLUS MINERALS) 600-800 MG-UNIT TABS Take 1 tablet by mouth at bedtime.    . Cholecalciferol (VITAMIN D) 2000 units CAPS Take 2,000 Units by mouth daily. Taking Vitamin D 3 2000 units daliy    . colchicine 0.6 MG tablet TAKE 1 TABLET DAILY BY  MOUTH. 90 tablet 1  . Diclofenac Sodium 1 % CREA Apply topically.    . docusate sodium (COLACE) 250 MG capsule Take 250 mg by mouth daily as needed for constipation.    . furosemide (LASIX) 20 MG tablet Take 20 mg by mouth daily.      Marland Kitchen gabapentin (NEURONTIN) 300 MG capsule Take 300 mg by mouth 2 (two) times daily.     . hydrochlorothiazide (HYDRODIURIL) 25 MG tablet Take 25 mg by mouth daily.  1  . HYDROcodone-acetaminophen (NORCO/VICODIN) 5-325 MG tablet Take 1 tablet by mouth every 6 (six) hours as needed for moderate pain. 20 tablet 0   . hydrocortisone (ANUSOL-HC) 2.5 % rectal cream Place 1 application rectally 2 (two) times daily as needed for hemorrhoids or itching.    . hydroxychloroquine (PLAQUENIL) 200 MG tablet TAKE 1 TABLET BY MOUTH DAILY 30 tablet 0  . lidocaine-prilocaine (EMLA) cream Apply 1 application topically as needed. 30 g 0  . losartan (COZAAR) 100 MG tablet Take 100 mg by mouth daily.  1  . magnesium oxide (MAG-OX) 400 MG  tablet Take 400 mg by mouth daily.     . metoprolol succinate (TOPROL-XL) 25 MG 24 hr tablet Take 1 tablet (25 mg total) by mouth daily. Take with or immediately following a meal. 90 tablet 3  . Multiple Vitamin (MULTIVITAMIN) tablet Take 1 tablet by mouth daily. CENTRUM    . omeprazole (PRILOSEC) 40 MG capsule Take 40 mg by mouth daily as needed (FOR ACID REFLUX/INDIGESTION).   12  . potassium chloride SA (K-DUR,KLOR-CON) 20 MEQ tablet Take 10 mEq by mouth daily.    . Trastuzumab (HERCEPTIN IV) Inject into the vein every 21 ( twenty-one) days.    Marland Kitchen triamcinolone cream (KENALOG) 0.1 % Apply 1 application topically 2 (two) times daily as needed (Vaginal Itching).    . warfarin (COUMADIN) 5 MG tablet Take 0.5-1 tablets (2.5-5 mg total) by mouth daily. Take 5 mg tablet daily or as directed by pharmacist from [facility] (Patient taking differently: Take 5 mg by mouth at bedtime. Taking according to doctors instructions after each lab check dosages vary. Clarify 09-30-17 Family  Dr. Kathryne Eriksson  orders lab draws for coumadin presently taking one and a half tablets  To equal 7.5 mg Mondays and Wednesdays all others days of the week she takes 5 mg of the coumadin.) 40 tablet 1   No current facility-administered medications for this visit.     OBJECTIVE: Older African-American woman in no acute distress  Vitals:   01/17/18 1405  BP: 139/73  Pulse: 85  Resp: 18  Temp: 98.5 F (36.9 C)  SpO2: 97%     Body mass index is 36.15 kg/m.   Wt Readings from Last 3 Encounters:  01/17/18 230  lb 12.8 oz (104.7 kg)  12/17/17 231 lb 9.6 oz (105.1 kg)  11/22/17 227 lb 12.8 oz (103.3 kg)      ECOG FS:1 - Symptomatic but completely ambulatory GENERAL: Patient is a well appearing female in no acute distress HEENT:  Sclerae anicteric.  Oropharynx clear and moist. No ulcerations or evidence of oropharyngeal candidiasis. Neck is supple.  NODES:  No cervical, supraclavicular, or axillary lymphadenopathy palpated.  BREAST EXAM:  Deferred. LUNGS:  Clear to auscultation bilaterally.  No wheezes or rhonchi. HEART:  Regular rate and rhythm. No murmur appreciated. ABDOMEN:  Soft, nontender.  Positive, normoactive bowel sounds. No organomegaly palpated. MSK:  No focal spinal tenderness to palpation. Full range of motion bilaterally in the upper extremities. EXTREMITIES:  No peripheral edema.   SKIN:  Clear with no obvious rashes or skin changes. No nail dyscrasia. NEURO:  Nonfocal. Well oriented.  Appropriate affect.   LAB RESULTS:  CMP     Component Value Date/Time   NA 143 01/07/2018 1042   NA 142 09/22/2015   K 3.6 01/07/2018 1042   CL 103 01/07/2018 1042   CO2 30 01/07/2018 1042   GLUCOSE 96 01/07/2018 1042   BUN 20 01/07/2018 1042   BUN 19 09/22/2015   CREATININE 0.88 01/07/2018 1042   CREATININE 0.91 (H) 10/11/2016 1136   CALCIUM 9.8 01/07/2018 1042   PROT 6.7 01/07/2018 1042   ALBUMIN 3.7 01/07/2018 1042   AST 26 01/07/2018 1042   ALT 35 01/07/2018 1042   ALKPHOS 83 01/07/2018 1042   BILITOT 0.6 01/07/2018 1042   GFRNONAA >60 01/07/2018 1042   GFRNONAA 60 10/11/2016 1136   GFRAA >60 01/07/2018 1042   GFRAA 69 10/11/2016 1136    No results found for: TOTALPROTELP, ALBUMINELP, A1GS, A2GS, BETS, BETA2SER, GAMS, MSPIKE, SPEI  No results found for: Nils Pyle, [facility]  Lab Results  Component Value Date   WBC 4.0 01/07/2018   NEUTROABS 2.1 01/07/2018   HGB 13.7 01/07/2018   HCT 43.3 01/07/2018   MCV 83.9 01/07/2018   PLT 148 (L) 01/07/2018     '@LASTCHEMISTRY' @  No results found for: LABCA2  No components found for: [id number]  No results for input(s): INR in the last 168 hours.  No results found for: LABCA2  No results found for: GXQ119  No results found for: ERD408  No results found for: XKG818  No results found for: CA2729  No components found for: HGQUANT  No results found for: CEA1 / No results found for: CEA1   No results found for: AFPTUMOR  No results found for: CHROMOGRNA  No results found for: PSA1  No visits with results within 3 Day(s) from this visit.  Latest known visit with results is:  Appointment on 01/07/2018  Component Date Value Ref Range Status  . Sodium 01/07/2018 143  135 - 145 mmol/L Final  . Potassium 01/07/2018 3.6  3.5 - 5.1 mmol/L Final  . Chloride 01/07/2018 103  98 - 111 mmol/L Final  . CO2 01/07/2018 30  22 - 32 mmol/L Final  . Glucose, Bld 01/07/2018 96  70 - 99 mg/dL Final  . BUN 01/07/2018 20  8 - 23 mg/dL Final  . Creatinine, Ser 01/07/2018 0.88  0.44 - 1.00 mg/dL Final  . Calcium 01/07/2018 9.8  8.9 - 10.3 mg/dL Final  . Total Protein 01/07/2018 6.7  6.5 - 8.1 g/dL Final  . Albumin 01/07/2018 3.7  3.5 - 5.0 g/dL Final  . AST 01/07/2018 26  15 - 41 U/L Final  . ALT 01/07/2018 35  0 - 44 U/L Final  . Alkaline Phosphatase 01/07/2018 83  38 - 126 U/L Final  . Total Bilirubin 01/07/2018 0.6  0.3 - 1.2 mg/dL Final  . GFR calc non Af Amer 01/07/2018 >60  >60 mL/min Final  . GFR calc Af Amer 01/07/2018 >60  >60 mL/min Final  . Anion gap 01/07/2018 10  5 - 15 Final   Performed at [facility], [place] [address] [place], [place] [address]  . WBC 01/07/2018 4.0  4.0 - 10.5 K/uL Final  . RBC 01/07/2018 5.16* 3.87 - 5.11 MIL/uL Final  . Hemoglobin 01/07/2018 13.7  12.0 - 15.0 g/dL Final  . HCT 01/07/2018 43.3  36.0 - 46.0 % Final  . MCV 01/07/2018 83.9  80.0 - 100.0 fL Final  . MCH 01/07/2018 26.6  26.0 - 34.0 pg Final  . MCHC 01/07/2018 31.6  30.0 -  36.0 g/dL Final  . RDW 01/07/2018 14.4  11.5 - 15.5 % Final  . Platelets 01/07/2018 148* 150 - 400 K/uL Final  . nRBC 01/07/2018 0.0  0.0 - 0.2 % Final  . Neutrophils Relative % 01/07/2018 52  % Final  . Neutro Abs 01/07/2018 2.1  1.7 - 7.7 K/uL Final  . Lymphocytes Relative 01/07/2018 36  % Final  . Lymphs Abs 01/07/2018 1.5  0.7 - 4.0 K/uL Final  . Monocytes Relative 01/07/2018 10  % Final  . Monocytes Absolute 01/07/2018 0.4  0.1 - 1.0 K/uL Final  . Eosinophils Relative 01/07/2018 1  % Final  . Eosinophils Absolute 01/07/2018 0.0  0.0 - 0.5 K/uL Final  . Basophils Relative 01/07/2018 1  % Final  . Basophils Absolute 01/07/2018 0.0  0.0 - 0.1 K/uL Final  . Immature Granulocytes 01/07/2018  0  % Final  . Abs Immature Granulocytes 01/07/2018 0.01  0.00 - 0.07 K/uL Final   Performed at [facility], [place] [address], [place], [place] [address]    (this displays the last labs from the last 3 days)  No results found for: TOTALPROTELP, ALBUMINELP, A1GS, A2GS, BETS, BETA2SER, GAMS, MSPIKE, SPEI (this displays SPEP labs)  No results found for: KPAFRELGTCHN, LAMBDASER, KAPLAMBRATIO (kappa/lambda light chains)  No results found for: HGBA, HGBA2QUANT, HGBFQUANT, HGBSQUAN (Hemoglobinopathy evaluation)   Lab Results  Component Value Date   LDH 162 10/22/2006    No results found for: IRON, TIBC, IRONPCTSAT (Iron and TIBC)  No results found for: FERRITIN  Urinalysis    Component Value Date/Time   COLORURINE YELLOW 09/27/2015 [place] 09/27/2015 1532   LABSPEC 1.015 09/27/2015 1532   PHURINE 6.5 09/27/2015 1532   GLUCOSEU NEGATIVE 09/27/2015 1532   HGBUR TRACE (A) 09/27/2015 1532   BILIRUBINUR NEGATIVE 09/27/2015 1532   KETONESUR NEGATIVE 09/27/2015 1532   PROTEINUR NEGATIVE 09/27/2015 1532   UROBILINOGEN 0.2 01/19/2013 0954   NITRITE NEGATIVE 09/27/2015 1532   LEUKOCYTESUR NEGATIVE 09/27/2015 1532     STUDIES: Echo on bone density  results discussed with the patient  ELIGIBLE FOR AVAILABLE RESEARCH PROTOCOL: Exact sciences study  ASSESSMENT: 84 y.o. [place] woman status post central right breast biopsy 03/29/2017 for a clinical T1c N0, stage IA invasive lobular carcinoma, grade 2, strongly estrogen and progesterone receptor positive, also HER-2 amplified, with an MIB-1 of 25%  (1) status post right mastectomy with sentinel lymph node sampling 05/07/2017 for a pT3 pN1, stage IIB invasive lobular carcinoma, grade 3, with negative margins.  (2) 07/02/2017 - 08/16/2017 Site/dose:   The patient initially received a dose of 50.4 Gy in 28 fractions to the right chest wall and supraclavicular region. This was delivered using a 3-D conformal, 4 field technique. The patient then received a boost to the mastectomy scar. This delivered an additional 10 Gy in 5 fractions using an en face electron field. The total dose was 60.4 Gy.  (3) trastuzumab adjuvantly for 1 year to start 06/04/2017  (a) echocardiogram 04/16/2017 shows an ejection fraction of 60-65%.  (b) echocardiogram 08/21/2017 shows an ejection fraction in the 60-65% range  (c) echo 11/22/2017 shows an ejection fraction in the 60-65% range  (4) anastrozole started 05/28/2017  (a) bone density 10/31/2017 shows a T score of -1.5  (5) small seroma right axilla, requiring follow-up  (6) Left jugular and subclavian DVT noted on 01/17/2018 while taking Warfarin   (a) started Enoxaparin 164m daily  PLAN: ESaydais doing moderately well today.  She was found to have an acute DVT in her left jugular and subclavian veins.  She is taking coumadin daily and has remained therapeutic.  She is followed by Dr. WRedmond Pullingfor this.  She met with Dr. MJana Hakimtoday.  She will stop Coumadin and starting tomorrow she will receive daily Enoxaparin.    Dr. MJana Hakimreviewed that she will continue this through August.  She will return on 1/21 for her next trastuzumab.  She knows to call  for any questions or concerns prior to her next appointment with [place]    LWilber Bihari NP  01/17/18 2:23 PM Medical Oncology and Hematology [facility], LP59[address] Wood[address] StreetATall Timbers [place] [id number]. [phone]   Fax. [phone]  ADDENDUM: ENaliyahhas developed a left upper extremity DVT while on Coumadin.  It is true  that she tells [place] some of her readings recently have been subtherapeutic because of changes in diet and changes in dosing.  Nevertheless that is of concern especially as her Port-A-Cath is on the contralateral side of the clot.  While NOAC's can be used in the setting of atrial fibrillation for stroke prevention, in the setting of cancer heparin and low molecular weight heparin in particular are the most reliable agents.  Esraa has previously used Lovenox, which she has injected herself, without complications.  That was our recommendation and the plan will be to be on this medication a minimum of 3 months, at which point we will repeat Doppler ultrasonography.  Although the patient has other risk factors for hypercoagulability, including obesity and a rather sedentary activity pattern, it may be worthwhile proceeding to staging studies to document that the patient does not have metastatic disease at this point.  I will set her up for a CT of the chest to be done after the biopsy of the very questionable right chest wall subcutaneous mass scheduled for 01/20/2018  I personally saw this patient and performed a substantive portion of this encounter with the listed APP documented above.   Chauncey Cruel, MD Medical Oncology and Hematology [facility] [address] [place], [place] [address] Tel. [phone]    Fax. [phone]

## 2018-01-17 NOTE — Progress Notes (Signed)
Vascular Ultrasound Carotid Duplex (Doppler) and left upper extremity venous duplex has been completed.  Refer to "CV Proc" under chart review to view preliminary results.  01/17/2018 1:59 PM Maudry Mayhew, MHA, RVT, RDCS, RDMS

## 2018-01-20 ENCOUNTER — Ambulatory Visit
Admission: RE | Admit: 2018-01-20 | Discharge: 2018-01-20 | Disposition: A | Discharge status: Home / Self Care | Payer: Medicare Other | Source: Ambulatory Visit | Attending: Adult Health | Admitting: Adult Health

## 2018-01-20 ENCOUNTER — Telehealth: Payer: Self-pay | Admitting: Adult Health

## 2018-01-20 DIAGNOSIS — N6310 Unspecified lump in the right breast, unspecified quadrant: Secondary | ICD-10-CM

## 2018-01-20 NOTE — Telephone Encounter (Signed)
Per 1/10 no los

## 2018-01-21 ENCOUNTER — Other Ambulatory Visit: Payer: Self-pay | Admitting: Adult Health

## 2018-01-21 DIAGNOSIS — Z1231 Encounter for screening mammogram for malignant neoplasm of breast: Secondary | ICD-10-CM

## 2018-01-28 ENCOUNTER — Telehealth: Payer: Self-pay

## 2018-01-28 ENCOUNTER — Telehealth: Payer: Self-pay | Admitting: Adult Health

## 2018-01-28 ENCOUNTER — Inpatient Hospital Stay: Payer: Medicare Other

## 2018-01-28 ENCOUNTER — Inpatient Hospital Stay (HOSPITAL_BASED_OUTPATIENT_CLINIC_OR_DEPARTMENT_OTHER): Payer: Medicare Other | Admitting: Adult Health

## 2018-01-28 VITALS — BP 137/71 | HR 68 | Temp 98.1°F | Resp 18

## 2018-01-28 DIAGNOSIS — C50111 Malignant neoplasm of central portion of right female breast: Secondary | ICD-10-CM | POA: Diagnosis not present

## 2018-01-28 DIAGNOSIS — Z7901 Long term (current) use of anticoagulants: Secondary | ICD-10-CM

## 2018-01-28 DIAGNOSIS — N6489 Other specified disorders of breast: Secondary | ICD-10-CM

## 2018-01-28 DIAGNOSIS — C50911 Malignant neoplasm of unspecified site of right female breast: Secondary | ICD-10-CM

## 2018-01-28 DIAGNOSIS — I82B12 Acute embolism and thrombosis of left subclavian vein: Secondary | ICD-10-CM | POA: Diagnosis not present

## 2018-01-28 DIAGNOSIS — I82C12 Acute embolism and thrombosis of left internal jugular vein: Secondary | ICD-10-CM | POA: Diagnosis not present

## 2018-01-28 DIAGNOSIS — Z95828 Presence of other vascular implants and grafts: Secondary | ICD-10-CM

## 2018-01-28 DIAGNOSIS — Z923 Personal history of irradiation: Secondary | ICD-10-CM

## 2018-01-28 DIAGNOSIS — Z79811 Long term (current) use of aromatase inhibitors: Secondary | ICD-10-CM

## 2018-01-28 DIAGNOSIS — Z87891 Personal history of nicotine dependence: Secondary | ICD-10-CM

## 2018-01-28 DIAGNOSIS — Z5112 Encounter for antineoplastic immunotherapy: Secondary | ICD-10-CM | POA: Diagnosis not present

## 2018-01-28 DIAGNOSIS — Z9011 Acquired absence of right breast and nipple: Secondary | ICD-10-CM

## 2018-01-28 DIAGNOSIS — Z170 Estrogen receptor positive status [ER+]: Secondary | ICD-10-CM

## 2018-01-28 LAB — COMPREHENSIVE METABOLIC PANEL
ALT: 79 U/L — ABNORMAL HIGH (ref 0–44)
AST: 44 U/L — AB (ref 15–41)
Albumin: 3.7 g/dL (ref 3.5–5.0)
Alkaline Phosphatase: 81 U/L (ref 38–126)
Anion gap: 9 (ref 5–15)
BILIRUBIN TOTAL: 0.5 mg/dL (ref 0.3–1.2)
BUN: 22 mg/dL (ref 8–23)
CO2: 29 mmol/L (ref 22–32)
Calcium: 9.9 mg/dL (ref 8.9–10.3)
Chloride: 104 mmol/L (ref 98–111)
Creatinine, Ser: 0.81 mg/dL (ref 0.44–1.00)
GFR calc Af Amer: >60 mL/min (ref >60)
GFR calc non Af Amer: >60 mL/min (ref >60)
GLUCOSE: 109 mg/dL — AB (ref 70–99)
Potassium: 3.7 mmol/L (ref 3.5–5.1)
Sodium: 142 mmol/L (ref 135–145)
Total Protein: 6.7 g/dL (ref 6.5–8.1)

## 2018-01-28 LAB — CBC WITH DIFFERENTIAL/PLATELET
Abs Immature Granulocytes: 0.03 10*3/uL (ref 0.00–0.07)
Basophils Absolute: 0 10*3/uL (ref 0.0–0.1)
Basophils Relative: 1 %
EOS PCT: 1 %
Eosinophils Absolute: 0.1 10*3/uL (ref 0.0–0.5)
HCT: 40 % (ref 36.0–46.0)
Hemoglobin: 12.4 g/dL (ref 12.0–15.0)
Immature Granulocytes: 1 %
Lymphocytes Relative: 39 %
Lymphs Abs: 1.5 10*3/uL (ref 0.7–4.0)
MCH: 26.5 pg (ref 26.0–34.0)
MCHC: 31 g/dL (ref 30.0–36.0)
MCV: 85.5 fL (ref 80.0–100.0)
MONOS PCT: 10 %
Monocytes Absolute: 0.4 10*3/uL (ref 0.1–1.0)
Neutro Abs: 1.9 10*3/uL (ref 1.7–7.7)
Neutrophils Relative %: 48 %
Platelets: 166 10*3/uL (ref 150–400)
RBC: 4.68 MIL/uL (ref 3.87–5.11)
RDW: 14.4 % (ref 11.5–15.5)
WBC: 3.9 10*3/uL — ABNORMAL LOW (ref 4.0–10.5)
nRBC: 0 % (ref 0.0–0.2)

## 2018-01-28 MED ORDER — HEPARIN SOD (PORK) LOCK FLUSH 100 UNIT/ML IV SOLN
500.0000 [IU] | Freq: Once | INTRAVENOUS | Status: AC | PRN
  Administered 2018-01-28: 500 [IU]
  Filled 2018-01-28: qty 5

## 2018-01-28 MED ORDER — SODIUM CHLORIDE 0.9% FLUSH
10.0000 mL | INTRAVENOUS | Status: DC | PRN
  Administered 2018-01-28: 10 mL
  Filled 2018-01-28: qty 10

## 2018-01-28 MED ORDER — TRASTUZUMAB CHEMO 150 MG IV SOLR
6.0000 mg/kg | Freq: Once | INTRAVENOUS | Status: AC
  Administered 2018-01-28: 651 mg via INTRAVENOUS
  Filled 2018-01-28: qty 31

## 2018-01-28 MED ORDER — DIPHENHYDRAMINE HCL 25 MG PO CAPS
25.0000 mg | ORAL_CAPSULE | Freq: Once | ORAL | Status: AC
  Administered 2018-01-28: 25 mg via ORAL

## 2018-01-28 MED ORDER — ACETAMINOPHEN 325 MG PO TABS
ORAL_TABLET | ORAL | Status: AC
  Filled 2018-01-28: qty 2

## 2018-01-28 MED ORDER — SODIUM CHLORIDE 0.9 % IV SOLN
Freq: Once | INTRAVENOUS | Status: AC
  Administered 2018-01-28: 12:00:00 via INTRAVENOUS
  Filled 2018-01-28: qty 250

## 2018-01-28 MED ORDER — DIPHENHYDRAMINE HCL 25 MG PO CAPS
ORAL_CAPSULE | ORAL | Status: AC
  Filled 2018-01-28: qty 1

## 2018-01-28 MED ORDER — ACETAMINOPHEN 325 MG PO TABS
650.0000 mg | ORAL_TABLET | Freq: Once | ORAL | Status: AC
  Administered 2018-01-28: 650 mg via ORAL

## 2018-01-28 NOTE — Patient Instructions (Signed)
[facility] Discharge Instructions for Patients Receiving Chemotherapy  Today you received the following chemotherapy agents Herceptin  To help prevent nausea and vomiting after your treatment, we encourage you to take your nausea medication as directed   If you develop nausea and vomiting that is not controlled by your nausea medication, call the clinic.   BELOW ARE SYMPTOMS THAT SHOULD BE REPORTED IMMEDIATELY:  *FEVER GREATER THAN 100.5 F  *CHILLS WITH OR WITHOUT FEVER  NAUSEA AND VOMITING THAT IS NOT CONTROLLED WITH YOUR NAUSEA MEDICATION  *UNUSUAL SHORTNESS OF BREATH  *UNUSUAL BRUISING OR BLEEDING  TENDERNESS IN MOUTH AND THROAT WITH OR WITHOUT PRESENCE OF ULCERS  *URINARY PROBLEMS  *BOWEL PROBLEMS  UNUSUAL RASH Items with * indicate a potential emergency and should be followed up as soon as possible.  Feel free to call the clinic should you have any questions or concerns. The clinic phone number is [phone].  Please show the CHEMO ALERT CARD at check-in to the Emergency Department and triage nurse.   

## 2018-01-28 NOTE — Telephone Encounter (Signed)
Spoke with patient to inform that her CT scan is ordered and will have to go through her insurance for approval before we are able to schedule scan.  Nurse inquired with radiology about getting radiaplex for patient.  Nurse in rad/onc said they could not get it for her since her radiation was completed in August.  However, patient can use vitamin E oil instead which is OTC.  Dr. Jana Hakim will return tomorrow and per NP will confirm about which blood thinner is best for her.  Patient voiced understanding of above and had no questions/concerns at this time.

## 2018-01-28 NOTE — Progress Notes (Signed)
[place]  [phone] [phone]     ID: Tara Cox DOB: 07/02/1934  MR#: [id number]  VOH#:[id number]  Patient Care Team: [name], MD as PCP - General (Family Medicine) Magrinat, Virgie Dad, MD as Consulting Physician (Oncology) Erroll Luna, MD as Consulting Physician (General Surgery) Kyung Rudd, MD as Consulting Physician (Radiation Oncology) Vickey Huger, MD as Consulting Physician (Orthopedic Surgery) Bo Merino, MD as Consulting Physician (Rheumatology) Lorretta Harp, MD as Consulting Physician (Cardiology) OTHER MD:   CHIEF COMPLAINT: Triple positive breast cancer  CURRENT TREATMENT: Anastrozole, trastuzumab; lovenox  HISTORY OF CURRENT ILLNESS: From the original intake note:  Tara Cox had mammography at the breast center October 11, 2011 showing calcifications in the upper right breast.  Six-month follow-up was suggested, but  the patient did not follow-up.  More recently she noticed nipple inversion on the right breast and followed this without reporting it over the last several months. She did undergo bilateral diagnostic mammography with tomography and right breast ultrasonography at [facility] on 03/26/2017 showing: breast density category B. There is a highly suspicious right retroareolar mass  measuring approximately 2.3 x 1.5 x 2.8 cm  with associated calcifications, together the mass and calcifications measure up to 3.3 cm mammographically. There are 2 lymph nodes in the right axilla with borderline thickened cortices, 1 of which measures 1 x 0.9 x 0.6 cm with a cortex thickness of 0.4 mm.  Accordingly on 03/29/2017 she proceeded to biopsy of the right breast and one axillary lymph node.. The pathology from this procedure showed ([id number]): Invasive lobular carcinoma,grade II, E-cadherin negative. The right axillary lymph node was negative for carcinoma. Prognostic indicators  significant for: estrogen receptor, 100% positive and progesterone receptor, 100% positive, both with strong staining intensity. Proliferation marker Ki67 at 25%. HER2 amplified with ratios  HER2/CEP17 signals 2.22 and average HER2 copies per cell 3.00  The patient's subsequent history is as detailed below.  INTERVAL HISTORY: Tara Cox returns today for follow-up of her triple positive breast cancer. She is accompanied by her husband.  She is due to receive her trastuzumab today.  At her last visit she was diagnosed with left upper extremity DVT while on Coumadin.  We switched her to Lovenox, and she has been injecting these daily since her last visit.  She still has intermittent left neck pains, but has also had some issues with bruising in her abdomen.  She denies any easy bleeding, such as hematochezia, melena.    Since her last visit she underwent right ultrasound guided core needle biopsy of an area of concern on the right lateral mastectomy bed on 01/20/2018 that showed granulomas and fibromas consistent with prior resection site changes.    REVIEW OF SYSTEMS: Tara Cox denies any unusual headaches or vision changes.  She is without fevers, chills, chest pain, palpitations, cough, or shortness of breath.  She denies nausea, vomiting, bowel/bladder changes, dysphagia.  Her activity level has remianed stable.  A detailed ROS was otherwise non contributory.    PAST MEDICAL HISTORY: Past Medical History:  Diagnosis Date  . A-fib ([place])   . Arthritis   . 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 DDD (degenerative disc disease), lumbar 12/08/2015   S/P discectomy   . Diaphragmatic hernia 12/08/2015  . Dysrhythmia    A-Fib  . Edema of lower extremity 06/29/09   Lower Venous Exam- normal. No evidence of thrombus or thrombophlebitis.  . Esophagitis 12/08/2015  . Frequent urination at night   .  GERD (gastroesophageal reflux disease)   . Hemorrhoids   . History of calcium pyrophosphate deposition  disease (CPPD) 12/08/2015  . History of miscarriage 12/08/2015   x3  . History of total right knee replacement 12/08/2015  . Hypertension 02/24/09   Echo-EF 62%; Myocardial Perfusion Study-Normal. No signifcant ischemia noted.  . IBS (irritable bowel syndrome)   . Migraine   . Osteoarthritis of both feet 12/08/2015  . Osteoarthritis of both hands 12/08/2015  . PONV (postoperative nausea and vomiting)   . Uterine fibroid 12/08/2015    PAST SURGICAL HISTORY: Past Surgical History:  Procedure Laterality Date  . ABDOMINAL HYSTERECTOMY  1974  . APPENDECTOMY    . BACK SURGERY  2001  . BUNIONECTOMY  1986  . CESAREAN SECTION  1971  . COLONOSCOPY    . COLONOSCOPY WITH PROPOFOL N/A 05/28/2014   Procedure: COLONOSCOPY WITH PROPOFOL;  Surgeon: Carol Ada, MD;  Location: [facility];  Service: Endoscopy;  Laterality: N/A;  . ESOPHAGOGASTRODUODENOSCOPY (EGD) WITH PROPOFOL N/A 05/28/2014   Procedure: ESOPHAGOGASTRODUODENOSCOPY (EGD) WITH PROPOFOL;  Surgeon: Carol Ada, MD;  Location: [facility];  Service: Endoscopy;  Laterality: N/A;  . EXPLORATORY LAPAROTOMY     open procedure  . EYE SURGERY Bilateral    cataract removal  . PORTACATH PLACEMENT Right 05/07/2017   Procedure: INSERTION PORT-A-CATH;  Surgeon: Erroll Luna, MD;  Location: [facility];  Service: General;  Laterality: Right;  . SHOULDER SURGERY Right   . SIMPLE MASTECTOMY WITH AXILLARY SENTINEL NODE BIOPSY Right 05/07/2017   Procedure: RIGHT SIMPLE MASTECTOMY WITH AXILLARY SENTINEL NODE BIOPSY;  Surgeon: Erroll Luna, MD;  Location: [facility];  Service: General;  Laterality: Right;  . TOTAL KNEE ARTHROPLASTY Right 01/26/2013   DR Ronnie Derby  . TOTAL KNEE ARTHROPLASTY Right 01/26/2013   Procedure: TOTAL KNEE ARTHROPLASTY;  Surgeon: Vickey Huger, MD;  Location: [facility];  Service: Orthopedics;  Laterality: Right;  . UTERINE FIBROID SURGERY  1967    FAMILY HISTORY Family History  Problem Relation Age of Onset  . Other Mother   . Varicose  Veins Mother   . Emphysema Mother   . Heart disease Father        heart attack  . Diabetes Sister   . Other Sister        pacemaker  . Diabetes Sister   . Heart disease Sister    The patient's father died in 57 (around age 52) in [place] due to sudden death. The patient's mother died around age 72 due to emphysema. The patient was born in [place], [place]. The patient has 2 brothers and 3 sisters. One sister had a lumpectomy in 1991, but this was not malignant. Another sister died of pancreatic cancer. The patient denies a family history of breast or ovarian cancer.    GYNECOLOGIC HISTORY:  No LMP recorded. Patient has had a hysterectomy. Menarche: 84 years old Age at first live birth: 84 years old She is GXP1.  Her LMP was at age 3 when she underwent hysterectomy, without salpingo-oophorectomy. She was on Premarin for more than 20 years.  She never used oral contraceptives.   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 Her husband, Tara Cox, used to be in banking, and he is currently retired. The patient's biological daughter, Tara Cox, is a Health and safety inspector at a contact center in [place]. The patient's adopted daughter, Tara Cox, is an Social research officer, government in [place]. The patient's adopted son, Tara Cox, is unemployed in [place]. The patient has  8 grandchildren, one of whom belongs to [place] and works as a Pharmacist, community.  The patient belongs to [place].      ADVANCED DIRECTIVES:    HEALTH MAINTENANCE: Social History   Tobacco Use  . Smoking status: Former Smoker    Packs/day: 1.00    Years: 20.00    Pack years: 20.00    Types: Cigarettes    Last attempt to quit: 02/19/1990    Years since quitting: 27.9  . Smokeless tobacco: Never Used  . Tobacco comment: ' i QUIT SMOKING MANY YEARS AGO "  Substance Use Topics  . Alcohol use: No  . Drug use: Never     Colonoscopy: Dr. Benson Norway 2016  PAP:  Bone  density:   Allergies  Allergen Reactions  . Oxycodone Itching, Nausea And Vomiting and Other (See Comments)    Full body weakness   . Hydrocodone Other (See Comments)    Reports they make her feel sick/drowsy  . Percocet [Oxycodone-Acetaminophen] Itching    Pt says she can take tylenol   . Soma [Carisoprodol] Nausea And Vomiting  . Ultram [Tramadol Hcl] Nausea And Vomiting    Current Outpatient Medications  Medication Sig Dispense Refill  . acetaminophen (TYLENOL) 650 MG CR tablet Take 650 mg by mouth every 8 (eight) hours as needed for pain.    Marland Kitchen anastrozole (ARIMIDEX) 1 MG tablet Take 1 tablet (1 mg total) by mouth daily. 90 tablet 4  . Ascorbic Acid (VITAMIN C WITH ROSE HIPS) 500 MG tablet Take 500 mg by mouth daily.    Marland Kitchen aspirin-sod bicarb-citric acid (ALKA-SELTZER) 325 MG TBEF tablet Take 325 mg by mouth every 6 (six) hours as needed.    Marland Kitchen atorvastatin (LIPITOR) 20 MG tablet Take 20 mg by mouth at bedtime.     . benzonatate (TESSALON) 200 MG capsule Take 200 mg by mouth 3 (three) times daily as needed for cough.    . Calcium Carbonate-Vit D-Min (CALCIUM 600+D3 PLUS MINERALS) 600-800 MG-UNIT TABS Take 1 tablet by mouth at bedtime.    . Cholecalciferol (VITAMIN D) 2000 units CAPS Take 2,000 Units by mouth daily. Taking Vitamin D 3 2000 units daliy    . colchicine 0.6 MG tablet TAKE 1 TABLET DAILY BY  MOUTH. 90 tablet 1  . Diclofenac Sodium 1 % CREA Apply topically.    . docusate sodium (COLACE) 250 MG capsule Take 250 mg by mouth daily as needed for constipation.    . enoxaparin (LOVENOX) 150 MG/ML injection Inject 1 mL (150 mg total) into the skin daily. 30 Syringe 5  . furosemide (LASIX) 20 MG tablet Take 20 mg by mouth daily.      Marland Kitchen gabapentin (NEURONTIN) 300 MG capsule Take 300 mg by mouth 2 (two) times daily.     . hydrochlorothiazide (HYDRODIURIL) 25 MG tablet Take 25 mg by mouth daily.  1  . HYDROcodone-acetaminophen (NORCO/VICODIN) 5-325 MG tablet Take 1 tablet by mouth  every 6 (six) hours as needed for moderate pain. 20 tablet 0  . hydrocortisone (ANUSOL-HC) 2.5 % rectal cream Place 1 application rectally 2 (two) times daily as needed for hemorrhoids or itching.    . hydroxychloroquine (PLAQUENIL) 200 MG tablet TAKE 1 TABLET BY MOUTH DAILY 30 tablet 0  . lidocaine-prilocaine (EMLA) cream Apply 1 application topically as needed. 30 g 0  . losartan (COZAAR) 100 MG tablet Take 100 mg by mouth daily.  1  . magnesium oxide (MAG-OX) 400 MG tablet Take  400 mg by mouth daily.     . metoprolol succinate (TOPROL-XL) 25 MG 24 hr tablet Take 1 tablet (25 mg total) by mouth daily. Take with or immediately following a meal. 90 tablet 3  . Multiple Vitamin (MULTIVITAMIN) tablet Take 1 tablet by mouth daily. CENTRUM    . omeprazole (PRILOSEC) 40 MG capsule Take 40 mg by mouth daily as needed (FOR ACID REFLUX/INDIGESTION).   12  . potassium chloride SA (K-DUR,KLOR-CON) 20 MEQ tablet Take 10 mEq by mouth daily.    . Trastuzumab (HERCEPTIN IV) Inject into the vein every 21 ( twenty-one) days.    Marland Kitchen triamcinolone cream (KENALOG) 0.1 % Apply 1 application topically 2 (two) times daily as needed (Vaginal Itching).     No current facility-administered medications for this visit.     OBJECTIVE:  See CH[facility] for vitals There were no vitals filed for this visit.   There is no height or weight on file to calculate BMI.   Wt Readings from Last 3 Encounters:  01/17/18 230 lb 12.8 oz (104.7 kg)  12/17/17 231 lb 9.6 oz (105.1 kg)  11/22/17 227 lb 12.8 oz (103.3 kg)  ECOG FS:1 - Symptomatic but completely ambulatory GENERAL: Patient is a well appearing female in no acute distress (exam limited, examined in recliner in chemo infusion room) HEENT:  Sclerae anicteric.  Oropharynx clear and moist. No ulcerations or evidence of oropharyngeal candidiasis. Neck is supple.  NODES:  No cervical, supraclavicular, or axillary lymphadenopathy palpated.  BREAST EXAM:  Ecchymosis noted around the right  mastectomy lateral biopsy site, no erythema, drainage, or sign of infection LUNGS:  Clear to auscultation bilaterally.  No wheezes or rhonchi. HEART:  Regular rate and rhythm. No murmur appreciated. ABDOMEN:  Soft, nontender.  Positive, normoactive bowel sounds. No organomegaly palpated.  Ecchymosis noted on abdomen lower abdomen worse than upper abdomen (from lovenox injections) EXTREMITIES:  No peripheral edema.   SKIN:  Clear with no obvious rashes or skin changes. No nail dyscrasia. NEURO:  Nonfocal. Well oriented.  Appropriate affect.   LAB RESULTS:  CMP     Component Value Date/Time   NA 142 01/28/2018 0902   NA 142 09/22/2015   K 3.7 01/28/2018 0902   CL 104 01/28/2018 0902   CO2 29 01/28/2018 0902   GLUCOSE 109 (H) 01/28/2018 0902   BUN 22 01/28/2018 0902   BUN 19 09/22/2015   CREATININE 0.81 01/28/2018 0902   CREATININE 0.91 (H) 10/11/2016 1136   CALCIUM 9.9 01/28/2018 0902   PROT 6.7 01/28/2018 0902   ALBUMIN 3.7 01/28/2018 0902   AST 44 (H) 01/28/2018 0902   ALT 79 (H) 01/28/2018 0902   ALKPHOS 81 01/28/2018 0902   BILITOT 0.5 01/28/2018 0902   GFRNONAA >60 01/28/2018 0902   GFRNONAA 60 10/11/2016 1136   GFRAA >60 01/28/2018 0902   GFRAA 69 10/11/2016 1136    No results found for: TOTALPROTELP, ALBUMINELP, A1GS, A2GS, BETS, BETA2SER, GAMS, MSPIKE, SPEI  No results found for: KPAFRELGTCHN, LAMBDASER, KAPLAMBRATIO  Lab Results  Component Value Date   WBC 3.9 (L) 01/28/2018   NEUTROABS 1.9 01/28/2018   HGB 12.4 01/28/2018   HCT 40.0 01/28/2018   MCV 85.5 01/28/2018   PLT 166 01/28/2018    _0 @  No results found for: LABCA2  No components found for: BZJIRC789  No results for input(s): INR in the last 168 hours.  No results found for: LABCA2  No results found for: FYB017  No results found for: PZW258  No results found for: HUT654  No results found for: CA2729  No components found for: HGQUANT  No results found for: CEA1 / No  results found for: CEA1   No results found for: AFPTUMOR  No results found for: CHROMOGRNA  No results found for: PSA1  Appointment on 01/28/2018  Component Date Value Ref Range Status  . WBC 01/28/2018 3.9* 4.0 - 10.5 K/uL Final  . RBC 01/28/2018 4.68  3.87 - 5.11 MIL/uL Final  . Hemoglobin 01/28/2018 12.4  12.0 - 15.0 g/dL Final  . HCT 01/28/2018 40.0  36.0 - 46.0 % Final  . MCV 01/28/2018 85.5  80.0 - 100.0 fL Final  . MCH 01/28/2018 26.5  26.0 - 34.0 pg Final  . MCHC 01/28/2018 31.0  30.0 - 36.0 g/dL Final  . RDW 01/28/2018 14.4  11.5 - 15.5 % Final  . Platelets 01/28/2018 166  150 - 400 K/uL Final  . nRBC 01/28/2018 0.0  0.0 - 0.2 % Final  . Neutrophils Relative % 01/28/2018 48  % Final  . Neutro Abs 01/28/2018 1.9  1.7 - 7.7 K/uL Final  . Lymphocytes Relative 01/28/2018 39  % Final  . Lymphs Abs 01/28/2018 1.5  0.7 - 4.0 K/uL Final  . Monocytes Relative 01/28/2018 10  % Final  . Monocytes Absolute 01/28/2018 0.4  0.1 - 1.0 K/uL Final  . Eosinophils Relative 01/28/2018 1  % Final  . Eosinophils Absolute 01/28/2018 0.1  0.0 - 0.5 K/uL Final  . Basophils Relative 01/28/2018 1  % Final  . Basophils Absolute 01/28/2018 0.0  0.0 - 0.1 K/uL Final  . Immature Granulocytes 01/28/2018 1  % Final  . Abs Immature Granulocytes 01/28/2018 0.03  0.00 - 0.07 K/uL Final   Performed at [facility], [place] [address], [place], [place] [address]  . Sodium 01/28/2018 142  135 - 145 mmol/L Final  . Potassium 01/28/2018 3.7  3.5 - 5.1 mmol/L Final  . Chloride 01/28/2018 104  98 - 111 mmol/L Final  . CO2 01/28/2018 29  22 - 32 mmol/L Final  . Glucose, Bld 01/28/2018 109* 70 - 99 mg/dL Final  . BUN 01/28/2018 22  8 - 23 mg/dL Final  . Creatinine, Ser 01/28/2018 0.81  0.44 - 1.00 mg/dL Final  . Calcium 01/28/2018 9.9  8.9 - 10.3 mg/dL Final  . Total Protein 01/28/2018 6.7  6.5 - 8.1 g/dL Final  . Albumin 01/28/2018 3.7  3.5 - 5.0 g/dL Final  . AST 01/28/2018 44* 15 - 41  U/L Final  . ALT 01/28/2018 79* 0 - 44 U/L Final  . Alkaline Phosphatase 01/28/2018 81  38 - 126 U/L Final  . Total Bilirubin 01/28/2018 0.5  0.3 - 1.2 mg/dL Final  . GFR calc non Af Amer 01/28/2018 >60  >60 mL/min Final  . GFR calc Af Amer 01/28/2018 >60  >60 mL/min Final  . Anion gap 01/28/2018 9  5 - 15 Final   Performed at [facility], [place] [address] [place], [place] [address]    (this displays the last labs from the last 3 days)  No results found for: TOTALPROTELP, ALBUMINELP, A1GS, A2GS, BETS, BETA2SER, GAMS, MSPIKE, SPEI (this displays SPEP labs)  No results found for: KPAFRELGTCHN, LAMBDASER, KAPLAMBRATIO (kappa/lambda light chains)  No results found for: HGBA, HGBA2QUANT, HGBFQUANT, HGBSQUAN (Hemoglobinopathy evaluation)   Lab Results  Component Value Date   LDH 162 10/22/2006    No results found for: IRON, TIBC, IRONPCTSAT (Iron and  TIBC)  No results found for: FERRITIN  Urinalysis    Component Value Date/Time   COLORURINE YELLOW 09/27/2015 [place] 09/27/2015 1532   LABSPEC 1.015 09/27/2015 1532   PHURINE 6.5 09/27/2015 1532   GLUCOSEU NEGATIVE 09/27/2015 1532   HGBUR TRACE (A) 09/27/2015 1532   BILIRUBINUR NEGATIVE 09/27/2015 1532   KETONESUR NEGATIVE 09/27/2015 1532   PROTEINUR NEGATIVE 09/27/2015 1532   UROBILINOGEN 0.2 01/19/2013 0954   NITRITE NEGATIVE 09/27/2015 1532   LEUKOCYTESUR NEGATIVE 09/27/2015 1532     STUDIES: Echo on bone density results discussed with the patient  ELIGIBLE FOR AVAILABLE RESEARCH PROTOCOL: Exact sciences study  ASSESSMENT: 84 y.o. [place] woman status post central right breast biopsy 03/29/2017 for a clinical T1c N0, stage IA invasive lobular carcinoma, grade 2, strongly estrogen and progesterone receptor positive, also HER-2 amplified, with an MIB-1 of 25%  (1) status post right mastectomy with sentinel lymph node sampling 05/07/2017 for a pT3 pN1, stage IIB invasive  lobular carcinoma, grade 3, with negative margins.  (2) 07/02/2017 - 08/16/2017 Site/dose:   The patient initially received a dose of 50.4 Gy in 28 fractions to the right chest wall and supraclavicular region. This was delivered using a 3-D conformal, 4 field technique. The patient then received a boost to the mastectomy scar. This delivered an additional 10 Gy in 5 fractions using an en face electron field. The total dose was 60.4 Gy.  (3) trastuzumab adjuvantly for 1 year to start 06/04/2017  (a) echocardiogram 04/16/2017 shows an ejection fraction of 60-65%.  (b) echocardiogram 08/21/2017 shows an ejection fraction in the 60-65% range  (c) echo 11/22/2017 shows an ejection fraction in the 60-65% range  (4) anastrozole started 05/28/2017  (a) bone density 10/31/2017 shows a T score of -1.5  (5) small seroma right axilla, requiring follow-up  (6) Left jugular and subclavian DVT noted on 01/17/2018 while taking Warfarin   (a) started Enoxaparin 129m daily  PLAN: Tara Cox doing well today. She continues to tolerate the Trastuzumab well and will continue this.  She has repeat echocardiogram scheduled with Dr. BHaroldine Lawson 02/27/2018.     I reviewed with her that after Dr. MJana Hakimand I saw her, he noted that after review, she can actually go on Xarelto or Eliquis for the DVT and the h/o a fib.  I will review with Dr. MJana Hakimexactly which blood thinner he wants to place her on.  She is very interested in going to a pill considering her difficulty in tolerating the lovenox injections.  For now, she will continue the injections and I will call her with which blood thinner she will start on.    Tara Cox also undergo CT chest to look for occult disease as she has had a blood clot while on coumadin, with no other etiology of hypercoagulability.  I have placed these orders today.    Tara Cox requested some radiaplex gel for her mastectomy site because it is tender and the gel is helping comfort  the site.  I talked with AShona Simpson her radiation physician assistant and she notes that since her radiation is complete, insurance will not pay for it.  We called Ima and recommended vitamin e oil or vitamin e lotion.    Tara Cox will continue on Trastuzumab every 3 weeks.  She will see u[place]again at the end of March.  She knows to call for any questions or concerns prior to her next appointment with u[place]    A  total of (30) minutes of face-to-face time was spent with this patient with greater than 50% of that time in counseling and care-coordination.  Tara Bihari, NP  01/28/18 3:56 PM Medical Oncology and Hematology [facility] [address] [place], [place] [address] Tel. [phone]    Fax. [phone]

## 2018-01-28 NOTE — Telephone Encounter (Signed)
No los °

## 2018-01-29 ENCOUNTER — Encounter: Payer: Self-pay | Admitting: Adult Health

## 2018-01-29 ENCOUNTER — Other Ambulatory Visit: Payer: Self-pay | Admitting: Adult Health

## 2018-01-29 MED ORDER — RIVAROXABAN 20 MG PO TABS: 20.0000 mg | ORAL_TABLET | Freq: Every day | ORAL | 1 refills | Status: DC

## 2018-01-29 NOTE — Progress Notes (Signed)
Reviewed patient difficulty with tolerating Lovenox with Dr. Jana Hakim.  After discussion, she will transition to Xarelto 20mg  daily.  No need for 15mg  PO BID load as she has been on Lovenox x 1 week.  I sent in a 90 day supply to [facility].  I attempted to call patient to let her know about this twice, and was unable to connect or leave a voice mail.    Wilber Bihari, NP

## 2018-02-04 ENCOUNTER — Ambulatory Visit (HOSPITAL_COMMUNITY)
Admission: RE | Admit: 2018-02-04 | Discharge: 2018-02-04 | Disposition: A | Discharge status: Home / Self Care | Payer: Medicare Other | Source: Ambulatory Visit | Attending: Adult Health | Admitting: Adult Health

## 2018-02-04 DIAGNOSIS — Z170 Estrogen receptor positive status [ER+]: Secondary | ICD-10-CM | POA: Insufficient documentation

## 2018-02-04 DIAGNOSIS — C50111 Malignant neoplasm of central portion of right female breast: Secondary | ICD-10-CM | POA: Diagnosis not present

## 2018-02-04 MED ORDER — HEPARIN SOD (PORK) LOCK FLUSH 100 UNIT/ML IV SOLN
500.0000 [IU] | Freq: Once | INTRAVENOUS | Status: AC
  Administered 2018-02-04: 500 [IU]

## 2018-02-04 MED ORDER — HEPARIN SOD (PORK) LOCK FLUSH 100 UNIT/ML IV SOLN
INTRAVENOUS | Status: AC
  Filled 2018-02-04: qty 5

## 2018-02-04 MED ORDER — SODIUM CHLORIDE (PF) 0.9 % IJ SOLN
INTRAMUSCULAR | Status: AC
  Filled 2018-02-04: qty 50

## 2018-02-04 MED ORDER — IOHEXOL 300 MG/ML  SOLN
75.0000 mL | Freq: Once | INTRAMUSCULAR | Status: AC | PRN
  Administered 2018-02-04: 75 mL via INTRAVENOUS

## 2018-02-05 ENCOUNTER — Telehealth: Payer: Self-pay

## 2018-02-05 NOTE — Telephone Encounter (Signed)
Spoke with patient to inform that her Chest CT shows no cancer.  Patient voiced understanding and thanks for call. Knows to call center with any issues.

## 2018-02-05 NOTE — Telephone Encounter (Signed)
-----   Message from Gardenia Phlegm, NP sent at 02/05/2018 11:12 AM EST ----- Please call patient with good news results.  CT chest shows no cancer ----- Message ----- From: Interface, Rad Results In Sent: 02/05/2018   9:32 AM EST To: Gardenia Phlegm, NP

## 2018-02-18 ENCOUNTER — Other Ambulatory Visit: Payer: Self-pay | Admitting: Oncology

## 2018-02-18 ENCOUNTER — Inpatient Hospital Stay: Payer: Medicare Other | Attending: Oncology

## 2018-02-18 ENCOUNTER — Inpatient Hospital Stay: Payer: Medicare Other

## 2018-02-18 VITALS — BP 147/64 | HR 76 | Temp 98.4°F | Resp 18

## 2018-02-18 DIAGNOSIS — C50911 Malignant neoplasm of unspecified site of right female breast: Secondary | ICD-10-CM

## 2018-02-18 DIAGNOSIS — Z95828 Presence of other vascular implants and grafts: Secondary | ICD-10-CM

## 2018-02-18 DIAGNOSIS — Z5112 Encounter for antineoplastic immunotherapy: Secondary | ICD-10-CM | POA: Insufficient documentation

## 2018-02-18 DIAGNOSIS — Z170 Estrogen receptor positive status [ER+]: Secondary | ICD-10-CM

## 2018-02-18 DIAGNOSIS — C50111 Malignant neoplasm of central portion of right female breast: Secondary | ICD-10-CM | POA: Diagnosis present

## 2018-02-18 LAB — CBC WITH DIFFERENTIAL/PLATELET
Abs Immature Granulocytes: 0 10*3/uL (ref 0.00–0.07)
BASOS PCT: 1 %
Basophils Absolute: 0 10*3/uL (ref 0.0–0.1)
EOS ABS: 0.1 10*3/uL (ref 0.0–0.5)
Eosinophils Relative: 2 %
HCT: 41.5 % (ref 36.0–46.0)
Hemoglobin: 13 g/dL (ref 12.0–15.0)
Immature Granulocytes: 0 %
Lymphocytes Relative: 41 %
Lymphs Abs: 1.3 10*3/uL (ref 0.7–4.0)
MCH: 27.1 pg (ref 26.0–34.0)
MCHC: 31.3 g/dL (ref 30.0–36.0)
MCV: 86.6 fL (ref 80.0–100.0)
Monocytes Absolute: 0.4 10*3/uL (ref 0.1–1.0)
Monocytes Relative: 11 %
Neutro Abs: 1.5 10*3/uL — ABNORMAL LOW (ref 1.7–7.7)
Neutrophils Relative %: 45 %
Platelets: 170 10*3/uL (ref 150–400)
RBC: 4.79 MIL/uL (ref 3.87–5.11)
RDW: 14.2 % (ref 11.5–15.5)
WBC: 3.3 10*3/uL — ABNORMAL LOW (ref 4.0–10.5)
nRBC: 0 % (ref 0.0–0.2)

## 2018-02-18 LAB — COMPREHENSIVE METABOLIC PANEL
ALT: 37 U/L (ref 0–44)
AST: 26 U/L (ref 15–41)
Albumin: 3.8 g/dL (ref 3.5–5.0)
Alkaline Phosphatase: 78 U/L (ref 38–126)
Anion gap: 8 (ref 5–15)
BILIRUBIN TOTAL: 0.4 mg/dL (ref 0.3–1.2)
BUN: 21 mg/dL (ref 8–23)
CALCIUM: 9.8 mg/dL (ref 8.9–10.3)
CO2: 31 mmol/L (ref 22–32)
CREATININE: 0.94 mg/dL (ref 0.44–1.00)
Chloride: 104 mmol/L (ref 98–111)
GFR calc Af Amer: >60 mL/min (ref >60)
GFR calc non Af Amer: 57 mL/min — ABNORMAL LOW (ref >60)
Glucose, Bld: 111 mg/dL — ABNORMAL HIGH (ref 70–99)
Potassium: 3.8 mmol/L (ref 3.5–5.1)
Sodium: 143 mmol/L (ref 135–145)
Total Protein: 6.6 g/dL (ref 6.5–8.1)

## 2018-02-18 MED ORDER — ACETAMINOPHEN 325 MG PO TABS
650.0000 mg | ORAL_TABLET | Freq: Once | ORAL | Status: AC
  Administered 2018-02-18: 650 mg via ORAL

## 2018-02-18 MED ORDER — SODIUM CHLORIDE 0.9% FLUSH
10.0000 mL | INTRAVENOUS | Status: DC | PRN
  Administered 2018-02-18: 10 mL
  Filled 2018-02-18: qty 10

## 2018-02-18 MED ORDER — ACETAMINOPHEN 325 MG PO TABS
ORAL_TABLET | ORAL | Status: AC
  Filled 2018-02-18: qty 2

## 2018-02-18 MED ORDER — DIPHENHYDRAMINE HCL 25 MG PO CAPS
25.0000 mg | ORAL_CAPSULE | Freq: Once | ORAL | Status: AC
  Administered 2018-02-18: 25 mg via ORAL

## 2018-02-18 MED ORDER — SODIUM CHLORIDE 0.9 % IV SOLN
Freq: Once | INTRAVENOUS | Status: AC
  Administered 2018-02-18: 12:00:00 via INTRAVENOUS
  Filled 2018-02-18: qty 250

## 2018-02-18 MED ORDER — DIPHENHYDRAMINE HCL 25 MG PO CAPS
ORAL_CAPSULE | ORAL | Status: AC
  Filled 2018-02-18: qty 1

## 2018-02-18 MED ORDER — HEPARIN SOD (PORK) LOCK FLUSH 100 UNIT/ML IV SOLN
500.0000 [IU] | Freq: Once | INTRAVENOUS | Status: AC | PRN
  Administered 2018-02-18: 500 [IU]
  Filled 2018-02-18: qty 5

## 2018-02-18 MED ORDER — TRASTUZUMAB CHEMO 150 MG IV SOLR
6.0000 mg/kg | Freq: Once | INTRAVENOUS | Status: AC
  Administered 2018-02-18: 651 mg via INTRAVENOUS
  Filled 2018-02-18: qty 31

## 2018-02-18 NOTE — Progress Notes (Unsigned)
Ms. Fleury has been considering advanced directives.  She brought [place] a document today which she typed January 25, 2018, stating that she does not want a defibrillator in case of a terminal event.  In case of a terminal disease were no further treatment is possible she would want a DO NOT RESUSCITATE.  However if she is not bedridden unable to take care of herself she would go for CPR.    We will try to get her a document that she can complete at her leisure and notarize to make these other wishes more legally binding but at this point I am only documenting her comments and making sure her separate paper will be scanned

## 2018-02-18 NOTE — Progress Notes (Signed)
Patient stated that since she started the Xarelto and had bleeding from her hemorrhoids for 3 days ina row which has since resolved. Made Val RN aware to communicate to Dr. Jana Hakim and per Dr. Jana Hakim patient to remain on the 20 mg and monitor and to call with any concerns. Patient aware.

## 2018-02-18 NOTE — Patient Instructions (Signed)
Cancer Center Discharge Instructions for Patients Receiving Chemotherapy  Today you received the following chemotherapy agents Herceptin  To help prevent nausea and vomiting after your treatment, we encourage you to take your nausea medication as directed   If you develop nausea and vomiting that is not controlled by your nausea medication, call the clinic.   BELOW ARE SYMPTOMS THAT SHOULD BE REPORTED IMMEDIATELY:  *FEVER GREATER THAN 100.5 F  *CHILLS WITH OR WITHOUT FEVER  NAUSEA AND VOMITING THAT IS NOT CONTROLLED WITH YOUR NAUSEA MEDICATION  *UNUSUAL SHORTNESS OF BREATH  *UNUSUAL BRUISING OR BLEEDING  TENDERNESS IN MOUTH AND THROAT WITH OR WITHOUT PRESENCE OF ULCERS  *URINARY PROBLEMS  *BOWEL PROBLEMS  UNUSUAL RASH Items with * indicate a potential emergency and should be followed up as soon as possible.  Feel free to call the clinic should you have any questions or concerns. The clinic phone number is [phone].  Please show the CHEMO ALERT CARD at check-in to the Emergency Department and triage nurse.   

## 2018-02-27 ENCOUNTER — Ambulatory Visit (HOSPITAL_COMMUNITY)
Admission: RE | Admit: 2018-02-27 | Discharge: 2018-02-27 | Disposition: A | Discharge status: Home / Self Care | Payer: Medicare Other | Source: Ambulatory Visit | Attending: Cardiology | Admitting: Cardiology

## 2018-02-27 ENCOUNTER — Ambulatory Visit (HOSPITAL_BASED_OUTPATIENT_CLINIC_OR_DEPARTMENT_OTHER)
Admission: RE | Admit: 2018-02-27 | Discharge: 2018-02-27 | Disposition: A | Discharge status: Home / Self Care | Payer: Medicare Other | Source: Ambulatory Visit | Attending: Cardiology | Admitting: Cardiology

## 2018-02-27 ENCOUNTER — Other Ambulatory Visit (HOSPITAL_COMMUNITY): Payer: Self-pay

## 2018-02-27 ENCOUNTER — Encounter (HOSPITAL_COMMUNITY): Payer: Self-pay | Admitting: Cardiology

## 2018-02-27 VITALS — BP 146/88 | HR 76 | Wt 232.8 lb

## 2018-02-27 DIAGNOSIS — Z7901 Long term (current) use of anticoagulants: Secondary | ICD-10-CM | POA: Diagnosis not present

## 2018-02-27 DIAGNOSIS — I10 Essential (primary) hypertension: Secondary | ICD-10-CM | POA: Insufficient documentation

## 2018-02-27 DIAGNOSIS — Z79899 Other long term (current) drug therapy: Secondary | ICD-10-CM | POA: Insufficient documentation

## 2018-02-27 DIAGNOSIS — Z9011 Acquired absence of right breast and nipple: Secondary | ICD-10-CM | POA: Diagnosis not present

## 2018-02-27 DIAGNOSIS — C50911 Malignant neoplasm of unspecified site of right female breast: Secondary | ICD-10-CM

## 2018-02-27 DIAGNOSIS — I480 Paroxysmal atrial fibrillation: Secondary | ICD-10-CM

## 2018-02-27 DIAGNOSIS — I82622 Acute embolism and thrombosis of deep veins of left upper extremity: Secondary | ICD-10-CM | POA: Diagnosis not present

## 2018-02-27 DIAGNOSIS — Z833 Family history of diabetes mellitus: Secondary | ICD-10-CM | POA: Insufficient documentation

## 2018-02-27 DIAGNOSIS — Z8249 Family history of ischemic heart disease and other diseases of the circulatory system: Secondary | ICD-10-CM | POA: Insufficient documentation

## 2018-02-27 DIAGNOSIS — K219 Gastro-esophageal reflux disease without esophagitis: Secondary | ICD-10-CM | POA: Insufficient documentation

## 2018-02-27 DIAGNOSIS — Z79811 Long term (current) use of aromatase inhibitors: Secondary | ICD-10-CM | POA: Insufficient documentation

## 2018-02-27 DIAGNOSIS — I491 Atrial premature depolarization: Secondary | ICD-10-CM | POA: Insufficient documentation

## 2018-02-27 DIAGNOSIS — E785 Hyperlipidemia, unspecified: Secondary | ICD-10-CM | POA: Diagnosis not present

## 2018-02-27 DIAGNOSIS — Z87891 Personal history of nicotine dependence: Secondary | ICD-10-CM | POA: Insufficient documentation

## 2018-02-27 MED ORDER — METOPROLOL SUCCINATE ER 50 MG PO TB24: 50.0000 mg | ORAL_TABLET | Freq: Every day | ORAL | 6 refills | Status: DC

## 2018-02-27 NOTE — Patient Instructions (Signed)
Increase Metoprolol XL to 50 mg daily  Your physician recommends that you schedule a follow-up appointment in: 6 weeks with echocardiogram

## 2018-02-27 NOTE — Progress Notes (Signed)
  Echocardiogram 2D Echocardiogram has been performed.  Tara Cox 02/27/2018, 11:11 AM

## 2018-02-27 NOTE — Progress Notes (Signed)
Oncology: Dr. Jana Hakim  84 y.o.with history of pseudogout, paroxysmal atrial fibrillation, osteoarthritis, and breast cancer was referred by Dr. Jana Hakim for cardio-oncology evaluation.    She was diagnosed in 3/19 with right breast cancer, ER+/PR+/HER2+.  She had right mastectomy in 4/19 with Herceptin x 1 year starting afterwards.  She has now completed XRT.   She wore a Zio patch in 8/19 showing a few short runs of SVT, no atrial fibrillation.   In 1/20, she developed a LUE DVT. She was switched from warfarin to Xarelto.   She returns for cardio-oncology followup today.  No pain or swelling in her right arm or neck. Rare palpitations.  No exertional dyspnea or chest pain.   Labs (10/18): creatinine 0.91  ECG (personally reviewed): NSR, nonspecific inferior T wave flattening  PMH: 1. CPPD/pseudogout 2. GERD 3. Degenerative disc disease s/p discectomy 4. Right TKR 5. HTN 6. IBS 7. Hyperlipidemia 8. Atrial fibrillation: Paroxysmal, she is on warfarin.  - Zio patch (8/19): shorts runs SVT, no atrial fibrillation.  9. Cardiolite 12/14 was normal. 10. Breast cancer: Diagnosed 3/19 with right breast cancer, ER+/PR+/HER2+.  Right mastectomy in 4/19 with Herceptin x 1 year afterwards.  Completed XRT.  - Echo (4/19): EF 60-65%, mild focal basal septal hypertrophy, GLS -18.1%, normal RV size and systolic function.  - Echo (8/19): EF 60-65%, GLS -39.7%, grade 2 diastolic dysfunction, normal RV size and systolic function, mild MR.  - Echo (11/19): EF 60-65%, GLS -19.4%, normal RV size/function - Echo (2/20): EF 60-65%, GLS -67.3%, grade 2 diastolic dysfunction, normal RV size and systolic function.  11. LUE DVT  Social History   Socioeconomic History  . Marital status: Married    Spouse name: Not on file  . Number of children: Not on file  . Years of education: Not on file  . Highest education level: Not on file  Occupational History  . Not on file  Social Needs  . Financial  resource strain: Not on file  . Food insecurity:    Worry: Not on file    Inability: Not on file  . Transportation needs:    Medical: Not on file    Non-medical: Not on file  Tobacco Use  . Smoking status: Former Smoker    Packs/day: 1.00    Years: 20.00    Pack years: 20.00    Types: Cigarettes    Last attempt to quit: 02/19/1990    Years since quitting: 28.0  . Smokeless tobacco: Never Used  . Tobacco comment: ' i QUIT SMOKING MANY YEARS AGO "  Substance and Sexual Activity  . Alcohol use: No  . Drug use: Never  . Sexual activity: Not on file  Lifestyle  . Physical activity:    Days per week: Not on file    Minutes per session: Not on file  . Stress: Not on file  Relationships  . Social connections:    Talks on phone: Not on file    Gets together: Not on file    Attends religious service: Not on file    Active member of club or organization: Not on file    Attends meetings of clubs or organizations: Not on file    Relationship status: Not on file  . Intimate partner violence:    Fear of current or ex partner: Not on file    Emotionally abused: Not on file    Physically abused: Not on file    Forced sexual activity: Not on file  Other  Topics Concern  . Not on file  Social History Narrative   [phone] Unable to ask abuse questions husband with her today.   Family History  Problem Relation Age of Onset  . Other Mother   . Varicose Veins Mother   . Emphysema Mother   . Heart disease Father        heart attack  . Diabetes Sister   . Other Sister        pacemaker  . Diabetes Sister   . Heart disease Sister    ROS: All systems reviewed and negative except as per HPI.   Current Outpatient Medications  Medication Sig Dispense Refill  . acetaminophen (TYLENOL) 650 MG CR tablet Take 650 mg by mouth every 8 (eight) hours as needed for pain.    Marland Kitchen anastrozole (ARIMIDEX) 1 MG tablet Take 1 tablet (1 mg total) by mouth daily. 90 tablet 4  . Ascorbic Acid (VITAMIN C WITH  ROSE HIPS) 500 MG tablet Take 500 mg by mouth daily.    Marland Kitchen aspirin-sod bicarb-citric acid (ALKA-SELTZER) 325 MG TBEF tablet Take 325 mg by mouth every 6 (six) hours as needed.    Marland Kitchen atorvastatin (LIPITOR) 20 MG tablet Take 20 mg by mouth at bedtime.     . Calcium Carbonate-Vit D-Min (CALCIUM 600+D3 PLUS MINERALS) 600-800 MG-UNIT TABS Take 1 tablet by mouth at bedtime.    . Cholecalciferol (VITAMIN D) 2000 units CAPS Take 2,000 Units by mouth daily. Taking Vitamin D 3 2000 units daliy    . colchicine 0.6 MG tablet TAKE 1 TABLET DAILY BY  MOUTH. 90 tablet 1  . Diclofenac Sodium 1 % CREA Apply topically.    . furosemide (LASIX) 20 MG tablet Take 20 mg by mouth daily.      Marland Kitchen gabapentin (NEURONTIN) 300 MG capsule Take 300 mg by mouth 2 (two) times daily.     . hydrochlorothiazide (HYDRODIURIL) 25 MG tablet Take 25 mg by mouth daily.  1  . HYDROcodone-acetaminophen (NORCO/VICODIN) 5-325 MG tablet Take 1 tablet by mouth every 6 (six) hours as needed for moderate pain. 20 tablet 0  . hydrocortisone (ANUSOL-HC) 2.5 % rectal cream Place 1 application rectally 2 (two) times daily as needed for hemorrhoids or itching.    . hydroxychloroquine (PLAQUENIL) 200 MG tablet TAKE 1 TABLET BY MOUTH DAILY 30 tablet 0  . lidocaine-prilocaine (EMLA) cream Apply 1 application topically as needed. 30 g 0  . losartan (COZAAR) 100 MG tablet Take 100 mg by mouth daily.  1  . magnesium oxide (MAG-OX) 400 MG tablet Take 400 mg by mouth daily.     . Multiple Vitamin (MULTIVITAMIN) tablet Take 1 tablet by mouth daily. CENTRUM    . omeprazole (PRILOSEC) 40 MG capsule Take 40 mg by mouth daily as needed (FOR ACID REFLUX/INDIGESTION).   12  . potassium chloride SA (K-DUR,KLOR-CON) 20 MEQ tablet Take 10 mEq by mouth daily.    . rivaroxaban (XARELTO) 20 MG TABS tablet Take 1 tablet (20 mg total) by mouth daily with supper. 90 tablet 1  . Trastuzumab (HERCEPTIN IV) Inject into the vein every 21 ( twenty-one) days.    Marland Kitchen triamcinolone  cream (KENALOG) 0.1 % Apply 1 application topically 2 (two) times daily as needed (Vaginal Itching).    . metoprolol succinate (TOPROL-XL) 50 MG 24 hr tablet Take 1 tablet (50 mg total) by mouth daily. Take with or immediately following a meal. 30 tablet 6   No current facility-administered medications for this encounter.  BP (!) 146/88   Pulse 76   Wt 105.6 kg (232 lb 12.8 oz)   SpO2 94%   BMI 36.46 kg/m  General: NAD Neck: No JVD, no thyromegaly or thyroid nodule.  Lungs: Clear to auscultation bilaterally with normal respiratory effort. CV: Nondisplaced PMI.  Heart regular S1/S2, no S3/S4, no murmur.  No peripheral edema.  No carotid bruit.  Normal pedal pulses.  Abdomen: Soft, nontender, no hepatosplenomegaly, no distention.  Skin: Intact without lesions or rashes.  Neurologic: Alert and oriented x 3.  Psych: Normal affect. Extremities: No clubbing or cyanosis.  HEENT: Normal.   Assessment/Plan: 1. Breast cancer: She will be on Herceptin for a total of a year, until 5/20.  I reviewed today's echo, EF is stable but strain is less negative.  - With less negative strain, I will increase Toprol XL to 50 mg daily and repeat echo at a shorter interval, in 6 wks.    2. Atrial fibrillation: Paroxysmal.  She is in NSR today.  Zio patch in 8/19 showed a few short runs SVT.  - Increase Toprol XL as above.   - Continue Xarelto.  3. HTN: BP borderline high.  Increasing Toprol XL.  4. LUE DVT: Transitioned from warfarin to Xarelto.    Followup in 6 wks with echo    Loralie Champagne 02/27/2018

## 2018-03-03 ENCOUNTER — Telehealth (HOSPITAL_COMMUNITY): Payer: Self-pay | Admitting: *Deleted

## 2018-03-03 NOTE — Telephone Encounter (Signed)
Metoprolol should not be affecting sensation in her left leg.  May be a nerve problem from her back, would see her PCP.

## 2018-03-03 NOTE — Telephone Encounter (Signed)
Pt aware and agreeable with plan.  

## 2018-03-03 NOTE — Telephone Encounter (Signed)
Pt called c/o numbness in left leg. Pt stated yesterday she woke up and from her knee down was numb. Pt said this morning she woke up to the same thing. Pt has not made any changes except the increase in her metoprolol per Dr.McLean at her last office visit. Pt wants to know if she should decrease her metoprolol. No additional complaints or concerns at this time. Message routed to [place] for advice.

## 2018-03-09 ENCOUNTER — Other Ambulatory Visit: Payer: Self-pay | Admitting: Hematology

## 2018-03-09 MED ORDER — CIPROFLOXACIN HCL 250 MG PO TABS: 250.0000 mg | ORAL_TABLET | Freq: Two times a day (BID) | ORAL | 0 refills | Status: DC

## 2018-03-11 ENCOUNTER — Other Ambulatory Visit: Payer: Self-pay | Admitting: Oncology

## 2018-03-11 ENCOUNTER — Inpatient Hospital Stay: Payer: Medicare Other | Attending: Oncology

## 2018-03-11 ENCOUNTER — Inpatient Hospital Stay: Payer: Medicare Other

## 2018-03-11 VITALS — BP 131/54 | HR 72 | Temp 98.2°F | Resp 18

## 2018-03-11 DIAGNOSIS — I480 Paroxysmal atrial fibrillation: Secondary | ICD-10-CM | POA: Insufficient documentation

## 2018-03-11 DIAGNOSIS — Z9011 Acquired absence of right breast and nipple: Secondary | ICD-10-CM | POA: Insufficient documentation

## 2018-03-11 DIAGNOSIS — C50911 Malignant neoplasm of unspecified site of right female breast: Secondary | ICD-10-CM

## 2018-03-11 DIAGNOSIS — C50111 Malignant neoplasm of central portion of right female breast: Secondary | ICD-10-CM | POA: Insufficient documentation

## 2018-03-11 DIAGNOSIS — Z79811 Long term (current) use of aromatase inhibitors: Secondary | ICD-10-CM | POA: Insufficient documentation

## 2018-03-11 DIAGNOSIS — Z95828 Presence of other vascular implants and grafts: Secondary | ICD-10-CM

## 2018-03-11 DIAGNOSIS — Z170 Estrogen receptor positive status [ER+]: Secondary | ICD-10-CM | POA: Insufficient documentation

## 2018-03-11 DIAGNOSIS — Z7901 Long term (current) use of anticoagulants: Secondary | ICD-10-CM | POA: Insufficient documentation

## 2018-03-11 DIAGNOSIS — Z923 Personal history of irradiation: Secondary | ICD-10-CM | POA: Diagnosis not present

## 2018-03-11 DIAGNOSIS — Z5112 Encounter for antineoplastic immunotherapy: Secondary | ICD-10-CM | POA: Insufficient documentation

## 2018-03-11 DIAGNOSIS — Z86718 Personal history of other venous thrombosis and embolism: Secondary | ICD-10-CM | POA: Insufficient documentation

## 2018-03-11 LAB — COMPREHENSIVE METABOLIC PANEL
ALT: 32 U/L (ref 0–44)
AST: 25 U/L (ref 15–41)
Albumin: 3.7 g/dL (ref 3.5–5.0)
Alkaline Phosphatase: 84 U/L (ref 38–126)
Anion gap: 10 (ref 5–15)
BUN: 16 mg/dL (ref 8–23)
CO2: 30 mmol/L (ref 22–32)
Calcium: 9.6 mg/dL (ref 8.9–10.3)
Chloride: 103 mmol/L (ref 98–111)
Creatinine, Ser: 1.12 mg/dL — ABNORMAL HIGH (ref 0.44–1.00)
GFR calc Af Amer: 53 mL/min — ABNORMAL LOW (ref >60)
GFR calc non Af Amer: 46 mL/min — ABNORMAL LOW (ref >60)
GLUCOSE: 125 mg/dL — AB (ref 70–99)
Potassium: 3.6 mmol/L (ref 3.5–5.1)
Sodium: 143 mmol/L (ref 135–145)
Total Bilirubin: 0.5 mg/dL (ref 0.3–1.2)
Total Protein: 6.7 g/dL (ref 6.5–8.1)

## 2018-03-11 LAB — CBC WITH DIFFERENTIAL/PLATELET
Abs Immature Granulocytes: 0.01 10*3/uL (ref 0.00–0.07)
Basophils Absolute: 0 10*3/uL (ref 0.0–0.1)
Basophils Relative: 1 %
Eosinophils Absolute: 0.1 10*3/uL (ref 0.0–0.5)
Eosinophils Relative: 1 %
HCT: 40.6 % (ref 36.0–46.0)
Hemoglobin: 13.1 g/dL (ref 12.0–15.0)
Immature Granulocytes: 0 %
LYMPHS ABS: 1.3 10*3/uL (ref 0.7–4.0)
Lymphocytes Relative: 29 %
MCH: 27.4 pg (ref 26.0–34.0)
MCHC: 32.3 g/dL (ref 30.0–36.0)
MCV: 84.9 fL (ref 80.0–100.0)
MONOS PCT: 12 %
Monocytes Absolute: 0.5 10*3/uL (ref 0.1–1.0)
Neutro Abs: 2.5 10*3/uL (ref 1.7–7.7)
Neutrophils Relative %: 57 %
Platelets: 164 10*3/uL (ref 150–400)
RBC: 4.78 MIL/uL (ref 3.87–5.11)
RDW: 13.4 % (ref 11.5–15.5)
WBC: 4.4 10*3/uL (ref 4.0–10.5)
nRBC: 0 % (ref 0.0–0.2)

## 2018-03-11 MED ORDER — DIPHENHYDRAMINE HCL 25 MG PO CAPS
25.0000 mg | ORAL_CAPSULE | Freq: Once | ORAL | Status: AC
  Administered 2018-03-11: 25 mg via ORAL

## 2018-03-11 MED ORDER — TRASTUZUMAB CHEMO 150 MG IV SOLR
6.0000 mg/kg | Freq: Once | INTRAVENOUS | Status: AC
  Administered 2018-03-11: 651 mg via INTRAVENOUS
  Filled 2018-03-11: qty 31

## 2018-03-11 MED ORDER — HEPARIN SOD (PORK) LOCK FLUSH 100 UNIT/ML IV SOLN
500.0000 [IU] | Freq: Once | INTRAVENOUS | Status: AC | PRN
  Administered 2018-03-11: 500 [IU]
  Filled 2018-03-11: qty 5

## 2018-03-11 MED ORDER — SODIUM CHLORIDE 0.9 % IV SOLN
Freq: Once | INTRAVENOUS | Status: AC
  Administered 2018-03-11: 13:00:00 via INTRAVENOUS
  Filled 2018-03-11: qty 250

## 2018-03-11 MED ORDER — SODIUM CHLORIDE 0.9% FLUSH
10.0000 mL | INTRAVENOUS | Status: DC | PRN
  Administered 2018-03-11: 10 mL
  Filled 2018-03-11: qty 10

## 2018-03-11 MED ORDER — ACETAMINOPHEN 325 MG PO TABS
ORAL_TABLET | ORAL | Status: AC
  Filled 2018-03-11: qty 2

## 2018-03-11 MED ORDER — DIPHENHYDRAMINE HCL 25 MG PO CAPS
ORAL_CAPSULE | ORAL | Status: AC
  Filled 2018-03-11: qty 1

## 2018-03-11 MED ORDER — ACETAMINOPHEN 325 MG PO TABS
650.0000 mg | ORAL_TABLET | Freq: Once | ORAL | Status: AC
  Administered 2018-03-11: 650 mg via ORAL

## 2018-03-11 NOTE — Patient Instructions (Signed)
[facility] Discharge Instructions for Patients Receiving Chemotherapy  Today you received the following chemotherapy agents Herceptin  To help prevent nausea and vomiting after your treatment, we encourage you to take your nausea medication as directed   If you develop nausea and vomiting that is not controlled by your nausea medication, call the clinic.   BELOW ARE SYMPTOMS THAT SHOULD BE REPORTED IMMEDIATELY:  *FEVER GREATER THAN 100.5 F  *CHILLS WITH OR WITHOUT FEVER  NAUSEA AND VOMITING THAT IS NOT CONTROLLED WITH YOUR NAUSEA MEDICATION  *UNUSUAL SHORTNESS OF BREATH  *UNUSUAL BRUISING OR BLEEDING  TENDERNESS IN MOUTH AND THROAT WITH OR WITHOUT PRESENCE OF ULCERS  *URINARY PROBLEMS  *BOWEL PROBLEMS  UNUSUAL RASH Items with * indicate a potential emergency and should be followed up as soon as possible.  Feel free to call the clinic should you have any questions or concerns. The clinic phone number is [phone].  Please show the CHEMO ALERT CARD at check-in to the Emergency Department and triage nurse.   

## 2018-03-26 ENCOUNTER — Other Ambulatory Visit: Payer: Self-pay | Admitting: Adult Health

## 2018-03-26 DIAGNOSIS — Z853 Personal history of malignant neoplasm of breast: Secondary | ICD-10-CM

## 2018-03-27 ENCOUNTER — Other Ambulatory Visit: Payer: Self-pay | Admitting: *Deleted

## 2018-03-27 MED ORDER — HYDROXYCHLOROQUINE SULFATE 200 MG PO TABS: ORAL_TABLET | ORAL | 1 refills | Status: DC

## 2018-03-27 NOTE — Telephone Encounter (Signed)
Refill request received via fax  Last Visit: 11/13/17 Next visit: 04/14/18 Labs: 03/11/18 Creat 1.12 GFR 53 Glucose 125 PLQ eye exam: 08/16/2017 normal.   Okay to refill per Dr. Estanislado Pandy

## 2018-03-28 ENCOUNTER — Other Ambulatory Visit: Payer: Self-pay

## 2018-03-28 ENCOUNTER — Other Ambulatory Visit: Payer: Self-pay | Admitting: Adult Health

## 2018-03-28 ENCOUNTER — Ambulatory Visit
Admission: RE | Admit: 2018-03-28 | Discharge: 2018-03-28 | Disposition: A | Discharge status: Home / Self Care | Payer: Medicare Other | Source: Ambulatory Visit | Attending: Adult Health | Admitting: Adult Health

## 2018-03-28 DIAGNOSIS — Z853 Personal history of malignant neoplasm of breast: Secondary | ICD-10-CM

## 2018-03-28 HISTORY — DX: Malignant (primary) neoplasm, unspecified: C80.1

## 2018-03-28 HISTORY — DX: Personal history of antineoplastic chemotherapy: Z92.21

## 2018-03-28 HISTORY — DX: Malignant neoplasm of unspecified site of unspecified female breast: C50.919

## 2018-03-28 HISTORY — DX: Personal history of irradiation: Z92.3

## 2018-03-31 ENCOUNTER — Other Ambulatory Visit: Payer: Self-pay | Admitting: Rheumatology

## 2018-03-31 NOTE — Telephone Encounter (Signed)
Last Visit: 11/13/2017 Next Visit: 04/14/2018 Labs: 03/11/2018 creat 1.12, GFR 53, elevated glucose  Okay to refill per Dr. Estanislado Pandy.

## 2018-03-31 NOTE — Progress Notes (Signed)
[place]  [phone] [phone]    ID: Anaja Monts DOB: [birth year]  MR#: [id number]  TKP#:[id number]  Patient Care Team: [name], MD as PCP - General (Family Medicine) Camil Wilhelmsen, Virgie Dad, MD as Consulting Physician (Oncology) Erroll Luna, MD as Consulting Physician (General Surgery) Kyung Rudd, MD as Consulting Physician (Radiation Oncology) Vickey Huger, MD as Consulting Physician (Orthopedic Surgery) Bo Merino, MD as Consulting Physician (Rheumatology) Lorretta Harp, MD as Consulting Physician (Cardiology) OTHER MD:   CHIEF COMPLAINT: Triple positive breast cancer  CURRENT TREATMENT: Anastrozole, trastuzumab; rivaroxaban   HISTORY OF CURRENT ILLNESS: From the original intake note:  [name] had mammography at the breast center October 11, 2011 showing calcifications in the upper right breast.  Six-month follow-up was suggested, but  the patient did not follow-up.  More recently she noticed nipple inversion on the right breast and followed this without reporting it over the last several months. She did undergo bilateral diagnostic mammography with tomography and right breast ultrasonography at [facility] on 03/26/2017 showing: breast density category B. There is a highly suspicious right retroareolar mass  measuring approximately 2.3 x 1.5 x 2.8 cm  with associated calcifications, together the mass and calcifications measure up to 3.3 cm mammographically. There are 2 lymph nodes in the right axilla with borderline thickened cortices, 1 of which measures 1 x 0.9 x 0.6 cm with a cortex thickness of 0.4 mm.  Accordingly on 03/29/2017 she proceeded to biopsy of the right breast and one axillary lymph node.. The pathology from this procedure showed ([id number]): Invasive lobular carcinoma,grade II, E-cadherin negative. The right axillary lymph node was negative for carcinoma. Prognostic indicators  significant for: estrogen receptor, 100% positive and progesterone receptor, 100% positive, both with strong staining intensity. Proliferation marker Ki67 at 25%. HER2 amplified with ratios  HER2/CEP17 signals 2.22 and average HER2 copies per cell 3.00  The patient's subsequent history is as detailed below.   INTERVAL HISTORY: Haven returns today for follow-up and treatment of her triple positive breast cancer.   She continues on anastrozole. She notes that evey now and again she will get some hot flashes and will feel irritated.  She wonders if this can be due to the anastrozole or the trastuzumab.  She also continues on trastuzumab. Lecretia's last echocardiogram on 02/27/2018, showed an ejection fraction in the 60% - 65% range.  This was reviewed by Dr. Ander Slade who felt that the strain was less negative and therefore he increased her Toprol.    She of course has a history of paroxysmal atrial fibrillation and is tolerating Xarelto well.  (Recall she developed a left subclavian clot while on warfarin). She notes that her hemmeroids bled quite a bit the first three days. Yesterday (03/31/2018) her hemerroid flaired up again. She has had no other bleeding to her knowledge.  Tanijah's last bone density screening on 10/31/2017, showed a T-score of -1.5, which is considered osteopenic.    Since her last visit here, she underwent a chest CT with contrast on 02/04/2018 showing: Status post right mastectomy. Radiation changes in the right upper lung. No evidence of recurrent or metastatic disease. Aortic Atherosclerosis (ICD10-I70.0).  She also underwent a digital screening unilateral left mammogram with tomography on 03/28/2018 showing: Breast Density Category B. There is no mammographic evidence for malignancy.    REVIEW OF SYSTEMS: Rupinder is practicing social distancing and is staying home as much as possible for COVID-19 protection. She had been following this  since early January and has been  slowly stocking up since then to prepare. At home, she has been sewing at home, and she even sewed the face mask she is wearing at today's appointment. Areana states that her sinuses are currently running as well. The patient denies unusual headaches, visual changes, nausea, vomiting, or dizziness. There has been no unusual cough, phlegm production, or pleurisy. This been no change in bowel or bladder habits. The patient denies unexplained fatigue or unexplained weight loss, rash, or fever. A detailed review of systems was otherwise noncontributory.    PAST MEDICAL HISTORY: Past Medical History:  Diagnosis Date   A-fib [facility])    Arthritis    Breast cancer ([place]) 05/07/2017   Right Breast Cancer   Cancer ([place])    Chondromalacia, patella 12/08/2015   DDD (degenerative disc disease), lumbar 12/08/2015   S/P discectomy    Diaphragmatic hernia 12/08/2015   Dysrhythmia    A-Fib   Edema of lower extremity 06/29/09   Lower Venous Exam- normal. No evidence of thrombus or thrombophlebitis.   Esophagitis 12/08/2015   Frequent urination at night    GERD (gastroesophageal reflux disease)    Hemorrhoids    History of calcium pyrophosphate deposition disease (CPPD) 12/08/2015   History of miscarriage 12/08/2015   x3   History of total right knee replacement 12/08/2015   Hypertension 02/24/09   Echo-EF 62%; Myocardial Perfusion Study-Normal. No signifcant ischemia noted.   IBS (irritable bowel syndrome)    Migraine    Osteoarthritis of both feet 12/08/2015   Osteoarthritis of both hands 12/08/2015   Personal history of chemotherapy 2019   Right Breast Cancer   Personal history of radiation therapy 2019   Right Breast Cancer   PONV (postoperative nausea and vomiting)    Uterine fibroid 12/08/2015    PAST SURGICAL HISTORY: Past Surgical History:  Procedure Laterality Date   ABDOMINAL HYSTERECTOMY  1974   APPENDECTOMY     BACK SURGERY  2001   [place]   COLONOSCOPY     COLONOSCOPY WITH PROPOFOL N/A 05/28/2014   Procedure: COLONOSCOPY WITH PROPOFOL;  Surgeon: Carol Ada, MD;  Location: [facility];  Service: Endoscopy;  Laterality: N/A;   ESOPHAGOGASTRODUODENOSCOPY (EGD) WITH PROPOFOL N/A 05/28/2014   Procedure: ESOPHAGOGASTRODUODENOSCOPY (EGD) WITH PROPOFOL;  Surgeon: Carol Ada, MD;  Location: [facility];  Service: Endoscopy;  Laterality: N/A;   EXPLORATORY LAPAROTOMY     open procedure   EYE SURGERY Bilateral    cataract removal   MASTECTOMY Left 05/07/2017   PORTACATH PLACEMENT Right 05/07/2017   Procedure: INSERTION PORT-A-CATH;  Surgeon: Erroll Luna, MD;  Location: [facility];  Service: General;  Laterality: Right;   SHOULDER SURGERY Right    SIMPLE MASTECTOMY WITH AXILLARY SENTINEL NODE BIOPSY Right 05/07/2017   Procedure: RIGHT SIMPLE MASTECTOMY WITH AXILLARY SENTINEL NODE BIOPSY;  Surgeon: Erroll Luna, MD;  Location: [place];  Service: General;  Laterality: Right;   TOTAL KNEE ARTHROPLASTY Right 01/26/2013   DR Ronnie Derby   TOTAL KNEE ARTHROPLASTY Right 01/26/2013   Procedure: TOTAL KNEE ARTHROPLASTY;  Surgeon: Vickey Huger, MD;  Location: [facility];  Service: Orthopedics;  Laterality: Right;   UTERINE FIBROID SURGERY  1967    FAMILY HISTORY Family History  Problem Relation Age of Onset   Other Mother    Varicose Veins Mother    Emphysema Mother    Heart disease Father        heart attack  Diabetes Sister    Other Sister        pacemaker   Diabetes Sister    Heart disease Sister    The patient's father died in 31 (around age 55) in [place] due to sudden death. The patient's mother died around age 41 due to emphysema. The patient was born in [place], [place]. The patient has 2 brothers and 3 sisters. One sister had a lumpectomy in 1991, but this was not malignant. Another sister died of pancreatic cancer. The patient denies a family history of breast or ovarian cancer.      GYNECOLOGIC HISTORY:  No LMP recorded. Patient has had a hysterectomy. Menarche: 84 years old Age at first live birth: 84 years old She is GXP1.  Her LMP was at age 62 when she underwent hysterectomy, without salpingo-oophorectomy. She was on Premarin for more than 20 years.  She never used oral contraceptives.   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 Her husband, Broadus John, used to be in banking, and he is currently retired. The patient's biological daughter, Clarene Critchley, is a Health and safety inspector at a contact center in [place]. The patient's adopted daughter, Lenna Sciara, is an Social research officer, government in [place]. The patient's adopted son, Gaspar Bidding, is unemployed in [place]. The patient has 8 grandchildren, one of whom belongs to [place] and works as a Pharmacist, community.  The patient belongs to [place].    ADVANCED DIRECTIVES:    HEALTH MAINTENANCE: Social History   Tobacco Use   Smoking status: Former Smoker    Packs/day: 1.00    Years: 20.00    Pack years: 20.00    Types: Cigarettes    Last attempt to quit: 02/19/1990    Years since quitting: 28.1   Smokeless tobacco: Never Used   Tobacco comment: ' i QUIT SMOKING MANY YEARS AGO "  Substance Use Topics   Alcohol use: No   Drug use: Never    Colonoscopy: Dr. Benson Norway 2016  PAP:  Bone density:   Allergies  Allergen Reactions   Oxycodone Itching, Nausea And Vomiting and Other (See Comments)    Full body weakness    Hydrocodone Other (See Comments)    Reports they make her feel sick/drowsy   Percocet [Oxycodone-Acetaminophen] Itching    Pt says she can take tylenol    Soma [Carisoprodol] Nausea And Vomiting   Ultram [Tramadol Hcl] Nausea And Vomiting    Current Outpatient Medications  Medication Sig Dispense Refill   acetaminophen (TYLENOL) 650 MG CR tablet Take 650 mg by mouth every 8 (eight) hours as needed for pain.     anastrozole  (ARIMIDEX) 1 MG tablet Take 1 tablet (1 mg total) by mouth daily. 90 tablet 4   Ascorbic Acid (VITAMIN C WITH ROSE HIPS) 500 MG tablet Take 500 mg by mouth daily.     aspirin-sod bicarb-citric acid (ALKA-SELTZER) 325 MG TBEF tablet Take 325 mg by mouth every 6 (six) hours as needed.     atorvastatin (LIPITOR) 20 MG tablet Take 20 mg by mouth at bedtime.      Calcium Carbonate-Vit D-Min (CALCIUM 600+D3 PLUS MINERALS) 600-800 MG-UNIT TABS Take 1 tablet by mouth at bedtime.     Cholecalciferol (VITAMIN D) 2000 units CAPS Take 2,000 Units by mouth daily. Taking Vitamin D 3 2000 units daliy     ciprofloxacin (CIPRO) 250 MG tablet Take 1 tablet (250 mg total) by mouth 2 (two) times daily. 10  tablet 0   colchicine 0.6 MG tablet TAKE 1 TABLET BY MOUTH  DAILY 90 tablet 0   Diclofenac Sodium 1 % CREA Apply topically.     furosemide (LASIX) 20 MG tablet Take 20 mg by mouth daily.       gabapentin (NEURONTIN) 300 MG capsule Take 300 mg by mouth 2 (two) times daily.      hydrochlorothiazide (HYDRODIURIL) 25 MG tablet Take 25 mg by mouth daily.  1   HYDROcodone-acetaminophen (NORCO/VICODIN) 5-325 MG tablet Take 1 tablet by mouth every 6 (six) hours as needed for moderate pain. 20 tablet 0   hydrocortisone (ANUSOL-HC) 2.5 % rectal cream Place 1 application rectally 2 (two) times daily as needed for hemorrhoids or itching.     hydroxychloroquine (PLAQUENIL) 200 MG tablet TAKE 1 TABLET BY MOUTH DAILY 90 tablet 1   lidocaine-prilocaine (EMLA) cream Apply 1 application topically as needed. 30 g 0   losartan (COZAAR) 100 MG tablet Take 100 mg by mouth daily.  1   magnesium oxide (MAG-OX) 400 MG tablet Take 400 mg by mouth daily.      metoprolol succinate (TOPROL-XL) 50 MG 24 hr tablet Take 1 tablet (50 mg total) by mouth daily. Take with or immediately following a meal. 30 tablet 6   Multiple Vitamin (MULTIVITAMIN) tablet Take 1 tablet by mouth daily. CENTRUM     omeprazole (PRILOSEC) 40 MG  capsule Take 40 mg by mouth daily as needed (FOR ACID REFLUX/INDIGESTION).   12   potassium chloride SA (K-DUR,KLOR-CON) 20 MEQ tablet Take 10 mEq by mouth daily.     rivaroxaban (XARELTO) 20 MG TABS tablet Take 1 tablet (20 mg total) by mouth daily with supper. 90 tablet 1   Trastuzumab (HERCEPTIN IV) Inject into the vein every 21 ( twenty-one) days.     triamcinolone cream (KENALOG) 0.1 % Apply 1 application topically 2 (two) times daily as needed (Vaginal Itching).     No current facility-administered medications for this visit.     OBJECTIVE: Elderly African-American woman in no acute distress  Vitals:   04/01/18 1155  BP: (!) 150/83  Pulse: 83  Resp: 18  Temp: 98.5 F (36.9 C)  SpO2: 98%     Body mass index is 37.04 kg/m.   Wt Readings from Last 3 Encounters:  04/01/18 236 lb 8 oz (107.3 kg)  02/27/18 232 lb 12.8 oz (105.6 kg)  01/17/18 230 lb 12.8 oz (104.7 kg)  ECOG FS:1 - Symptomatic but completely ambulatory  Sclerae unicteric, EOMs intact Oropharynx clear and moist No cervical or supraclavicular adenopathy Lungs no rales or rhonchi Heart regular rate and rhythm Abd soft, nontender, positive bowel sounds MSK no focal spinal tenderness, no upper extremity lymphedema Neuro: nonfocal, well oriented, appropriate affect Breasts: Deferred    LAB RESULTS:  CMP     Component Value Date/Time   NA 141 04/01/2018 1123   NA 142 09/22/2015   K 3.6 04/01/2018 1123   CL 101 04/01/2018 1123   CO2 29 04/01/2018 1123   GLUCOSE 96 04/01/2018 1123   BUN 13 04/01/2018 1123   BUN 19 09/22/2015   CREATININE 0.85 04/01/2018 1123   CREATININE 0.91 (H) 10/11/2016 1136   CALCIUM 9.8 04/01/2018 1123   PROT 6.8 04/01/2018 1123   ALBUMIN 3.8 04/01/2018 1123   AST 28 04/01/2018 1123   ALT 33 04/01/2018 1123   ALKPHOS 89 04/01/2018 1123   BILITOT 0.7 04/01/2018 1123   GFRNONAA >60 04/01/2018 1123   GFRNONAA 60  10/11/2016 1136   GFRAA >60 04/01/2018 1123   GFRAA 69  10/11/2016 1136    No results found for: TOTALPROTELP, ALBUMINELP, A1GS, A2GS, BETS, BETA2SER, GAMS, MSPIKE, SPEI  No results found for: KPAFRELGTCHN, LAMBDASER, KAPLAMBRATIO  Lab Results  Component Value Date   WBC 3.6 (L) 04/01/2018   NEUTROABS 1.6 (L) 04/01/2018   HGB 13.2 04/01/2018   HCT 41.7 04/01/2018   MCV 85.6 04/01/2018   PLT 172 04/01/2018    '@LASTCHEMISTRY' @  No results found for: LABCA2  No components found for: [id number]  No results for input(s): INR in the last 168 hours.  No results found for: LABCA2  No results found for: TWS568  No results found for: LEX517  No results found for: GYF749  No results found for: CA2729  No components found for: HGQUANT  No results found for: CEA1 / No results found for: CEA1   No results found for: AFPTUMOR  No results found for: CHROMOGRNA  No results found for: PSA1  Appointment on 04/01/2018  Component Date Value Ref Range Status   Sodium 04/01/2018 141  135 - 145 mmol/L Final   Potassium 04/01/2018 3.6  3.5 - 5.1 mmol/L Final   Chloride 04/01/2018 101  98 - 111 mmol/L Final   CO2 04/01/2018 29  22 - 32 mmol/L Final   Glucose, Bld 04/01/2018 96  70 - 99 mg/dL Final   BUN 04/01/2018 13  8 - 23 mg/dL Final   Creatinine, Ser 04/01/2018 0.85  0.44 - 1.00 mg/dL Final   Calcium 04/01/2018 9.8  8.9 - 10.3 mg/dL Final   Total Protein 04/01/2018 6.8  6.5 - 8.1 g/dL Final   Albumin 04/01/2018 3.8  3.5 - 5.0 g/dL Final   AST 04/01/2018 28  15 - 41 U/L Final   ALT 04/01/2018 33  0 - 44 U/L Final   Alkaline Phosphatase 04/01/2018 89  38 - 126 U/L Final   Total Bilirubin 04/01/2018 0.7  0.3 - 1.2 mg/dL Final   GFR calc non Af Amer 04/01/2018 >60  >60 mL/min Final   GFR calc Af Amer 04/01/2018 >60  >60 mL/min Final   Anion gap 04/01/2018 11  5 - 15 Final   Performed at [facility], [place] [address] [place], [place] [address]   WBC 04/01/2018 3.6* 4.0 - 10.5 K/uL Final    RBC 04/01/2018 4.87  3.87 - 5.11 MIL/uL Final   Hemoglobin 04/01/2018 13.2  12.0 - 15.0 g/dL Final   HCT 04/01/2018 41.7  36.0 - 46.0 % Final   MCV 04/01/2018 85.6  80.0 - 100.0 fL Final   MCH 04/01/2018 27.1  26.0 - 34.0 pg Final   MCHC 04/01/2018 31.7  30.0 - 36.0 g/dL Final   RDW 04/01/2018 13.2  11.5 - 15.5 % Final   Platelets 04/01/2018 172  150 - 400 K/uL Final   nRBC 04/01/2018 0.0  0.0 - 0.2 % Final   Neutrophils Relative % 04/01/2018 43  % Final   Neutro Abs 04/01/2018 1.6* 1.7 - 7.7 K/uL Final   Lymphocytes Relative 04/01/2018 45  % Final   Lymphs Abs 04/01/2018 1.6  0.7 - 4.0 K/uL Final   Monocytes Relative 04/01/2018 10  % Final   Monocytes Absolute 04/01/2018 0.4  0.1 - 1.0 K/uL Final   Eosinophils Relative 04/01/2018 1  % Final   Eosinophils Absolute 04/01/2018 0.1  0.0 - 0.5 K/uL Final   Basophils Relative 04/01/2018 1  % Final   Basophils  Absolute 04/01/2018 0.0  0.0 - 0.1 K/uL Final   Immature Granulocytes 04/01/2018 0  % Final   Abs Immature Granulocytes 04/01/2018 0.00  0.00 - 0.07 K/uL Final   Performed at [facility], [address] [place], [place] [address]    (this displays the last labs from the last 3 days)  No results found for: TOTALPROTELP, ALBUMINELP, A1GS, A2GS, BETS, BETA2SER, GAMS, MSPIKE, SPEI (this displays SPEP labs)  No results found for: KPAFRELGTCHN, LAMBDASER, KAPLAMBRATIO (kappa/lambda light chains)  No results found for: HGBA, HGBA2QUANT, HGBFQUANT, HGBSQUAN (Hemoglobinopathy evaluation)   Lab Results  Component Value Date   LDH 162 10/22/2006    No results found for: IRON, TIBC, IRONPCTSAT (Iron and TIBC)  No results found for: FERRITIN  Urinalysis    Component Value Date/Time   COLORURINE YELLOW 09/27/2015 [place] 09/27/2015 1532   LABSPEC 1.015 09/27/2015 1532   PHURINE 6.5 09/27/2015 1532   GLUCOSEU NEGATIVE 09/27/2015 1532   HGBUR TRACE (A) 09/27/2015  1532   BILIRUBINUR NEGATIVE 09/27/2015 1532   KETONESUR NEGATIVE 09/27/2015 1532   PROTEINUR NEGATIVE 09/27/2015 1532   UROBILINOGEN 0.2 01/19/2013 0954   NITRITE NEGATIVE 09/27/2015 1532   LEUKOCYTESUR NEGATIVE 09/27/2015 1532     STUDIES: Mm 3d Screen Breast Uni Left  Result Date: 03/28/2018 CLINICAL DATA:  Screening. EXAM: DIGITAL SCREENING UNILATERAL LEFT MAMMOGRAM WITH CAD AND TOMO COMPARISON:  Previous exam(s). ACR Breast Density Category b: There are scattered areas of fibroglandular density. FINDINGS: There are no findings suspicious for malignancy. Images were processed with CAD. IMPRESSION: No mammographic evidence of malignancy. A result letter of this screening mammogram will be mailed directly to the patient. RECOMMENDATION: Screening mammogram in one year. (Code:[id number]) BI-RADS CATEGORY  1: Negative. Electronically Signed   By: Franki Cabot M.D.   On: 03/28/2018 10:03    ELIGIBLE FOR AVAILABLE RESEARCH PROTOCOL: [employer] sciences study   ASSESSMENT: 84 y.o. [place] woman status post central right breast biopsy 03/29/2017 for a clinical T1c N0, stage IA invasive lobular carcinoma, grade 2, strongly estrogen and progesterone receptor positive, also HER-2 amplified, with an MIB-1 of 25%  (1) status post right mastectomy with sentinel lymph node sampling 05/07/2017 for a pT3 pN1, stage IIB invasive lobular carcinoma, grade 3, with negative margins.  (2) adjuvant radiation 07/02/2017 - 08/16/2017 Site/dose:   The patient initially received a dose of 50.4 Gy in 28 fractions to the right chest wall and supraclavicular region. This was delivered using a 3-D conformal, 4 field technique. The patient then received a boost to the mastectomy scar. This delivered an additional 10 Gy in 5 fractions using an en face electron field. The total dose was 60.4 Gy.  (3) trastuzumab adjuvantly for 1 year started 06/04/2017, to be completed 05/13/2018  (a) echocardiogram 04/16/2017 shows an  ejection fraction of 60-65%.  (b) echocardiogram 08/21/2017 shows an ejection fraction in the 60-65% range  (c) echo 11/22/2017 shows an ejection fraction in the 60-65% range  (d) echocardiogram 02/27/2018 showed an ejection fraction in the 60-65% range.  (4) anastrozole started 05/28/2017  (a) bone density 10/31/2017 shows a T score of -1.5  (5) small seroma right axilla  (6) history of paroxysmal atrial fibrillation, on lifelong anticoagulation   (a) left jugular and subclavian DVT noted on 01/17/2018 while taking Warfarin   (a) started Enoxaparin 138m daily January 2020, switched to rivaroxaban February 2020   PLAN: EAdrieannais tolerating trastuzumab well.  She is being closely  followed by cardio oncology and she has only 2 more doses after today, her last dose being 05/13/2018.  She will continue on rivaroxaban indefinitely.  She is tolerating this well.  She will have her port removed anytime after 05/13/2018.  She is already in isolation except for her visits here and some food shopping.  I am not going to see her again for another 6 months.  We discussed repeating a Doppler before that visit but it really would not change the overall plan and so we are not doing that.  She received some bills from her recent scans and I told her how she may question them if there are any issues outstanding  Otherwise she knows to call for any other problems that may develop before her next visit.  The plan remains to continue anastrozole a total of 5 years.  Claus Silvestro, Virgie Dad, MD  04/01/18 12:13 PM Medical Oncology and Hematology [facility] [address] [place], [place] [address] Tel. [phone]    Fax. [phone]  I, Jacqualyn Posey am acting as a Education administrator for Chauncey Cruel, MD.   I, Lurline Del MD, have reviewed the above documentation for accuracy and completeness, and I agree with the above.

## 2018-04-01 ENCOUNTER — Inpatient Hospital Stay (HOSPITAL_BASED_OUTPATIENT_CLINIC_OR_DEPARTMENT_OTHER): Payer: Medicare Other | Admitting: Oncology

## 2018-04-01 ENCOUNTER — Other Ambulatory Visit: Payer: Self-pay

## 2018-04-01 ENCOUNTER — Inpatient Hospital Stay: Payer: Medicare Other

## 2018-04-01 VITALS — BP 150/83 | HR 83 | Temp 98.5°F | Resp 18 | Ht 67.0 in | Wt 236.5 lb

## 2018-04-01 DIAGNOSIS — C50111 Malignant neoplasm of central portion of right female breast: Secondary | ICD-10-CM

## 2018-04-01 DIAGNOSIS — Z170 Estrogen receptor positive status [ER+]: Secondary | ICD-10-CM

## 2018-04-01 DIAGNOSIS — Z7901 Long term (current) use of anticoagulants: Secondary | ICD-10-CM

## 2018-04-01 DIAGNOSIS — Z9011 Acquired absence of right breast and nipple: Secondary | ICD-10-CM

## 2018-04-01 DIAGNOSIS — I480 Paroxysmal atrial fibrillation: Secondary | ICD-10-CM | POA: Diagnosis not present

## 2018-04-01 DIAGNOSIS — Z79811 Long term (current) use of aromatase inhibitors: Secondary | ICD-10-CM | POA: Diagnosis not present

## 2018-04-01 DIAGNOSIS — Z923 Personal history of irradiation: Secondary | ICD-10-CM

## 2018-04-01 DIAGNOSIS — C50911 Malignant neoplasm of unspecified site of right female breast: Secondary | ICD-10-CM

## 2018-04-01 DIAGNOSIS — Z86718 Personal history of other venous thrombosis and embolism: Secondary | ICD-10-CM

## 2018-04-01 DIAGNOSIS — Z95828 Presence of other vascular implants and grafts: Secondary | ICD-10-CM

## 2018-04-01 DIAGNOSIS — I700 Atherosclerosis of aorta: Secondary | ICD-10-CM | POA: Insufficient documentation

## 2018-04-01 DIAGNOSIS — Z5112 Encounter for antineoplastic immunotherapy: Secondary | ICD-10-CM | POA: Diagnosis not present

## 2018-04-01 LAB — COMPREHENSIVE METABOLIC PANEL
ALT: 33 U/L (ref 0–44)
AST: 28 U/L (ref 15–41)
Albumin: 3.8 g/dL (ref 3.5–5.0)
Alkaline Phosphatase: 89 U/L (ref 38–126)
Anion gap: 11 (ref 5–15)
BUN: 13 mg/dL (ref 8–23)
CO2: 29 mmol/L (ref 22–32)
Calcium: 9.8 mg/dL (ref 8.9–10.3)
Chloride: 101 mmol/L (ref 98–111)
Creatinine, Ser: 0.85 mg/dL (ref 0.44–1.00)
GFR calc Af Amer: >60 mL/min (ref >60)
GFR calc non Af Amer: >60 mL/min (ref >60)
Glucose, Bld: 96 mg/dL (ref 70–99)
Potassium: 3.6 mmol/L (ref 3.5–5.1)
Sodium: 141 mmol/L (ref 135–145)
Total Bilirubin: 0.7 mg/dL (ref 0.3–1.2)
Total Protein: 6.8 g/dL (ref 6.5–8.1)

## 2018-04-01 LAB — CBC WITH DIFFERENTIAL/PLATELET
Abs Immature Granulocytes: 0 10*3/uL (ref 0.00–0.07)
BASOS ABS: 0 10*3/uL (ref 0.0–0.1)
Basophils Relative: 1 %
EOS PCT: 1 %
Eosinophils Absolute: 0.1 10*3/uL (ref 0.0–0.5)
HCT: 41.7 % (ref 36.0–46.0)
Hemoglobin: 13.2 g/dL (ref 12.0–15.0)
Immature Granulocytes: 0 %
LYMPHS PCT: 45 %
Lymphs Abs: 1.6 10*3/uL (ref 0.7–4.0)
MCH: 27.1 pg (ref 26.0–34.0)
MCHC: 31.7 g/dL (ref 30.0–36.0)
MCV: 85.6 fL (ref 80.0–100.0)
Monocytes Absolute: 0.4 10*3/uL (ref 0.1–1.0)
Monocytes Relative: 10 %
NRBC: 0 % (ref 0.0–0.2)
Neutro Abs: 1.6 10*3/uL — ABNORMAL LOW (ref 1.7–7.7)
Neutrophils Relative %: 43 %
Platelets: 172 10*3/uL (ref 150–400)
RBC: 4.87 MIL/uL (ref 3.87–5.11)
RDW: 13.2 % (ref 11.5–15.5)
WBC: 3.6 10*3/uL — ABNORMAL LOW (ref 4.0–10.5)

## 2018-04-01 MED ORDER — TRASTUZUMAB CHEMO 150 MG IV SOLR
6.0000 mg/kg | Freq: Once | INTRAVENOUS | Status: AC
  Administered 2018-04-01: 651 mg via INTRAVENOUS
  Filled 2018-04-01: qty 31

## 2018-04-01 MED ORDER — SODIUM CHLORIDE 0.9 % IV SOLN
Freq: Once | INTRAVENOUS | Status: AC
  Administered 2018-04-01: 13:00:00 via INTRAVENOUS
  Filled 2018-04-01: qty 250

## 2018-04-01 MED ORDER — HEPARIN SOD (PORK) LOCK FLUSH 100 UNIT/ML IV SOLN
500.0000 [IU] | Freq: Once | INTRAVENOUS | Status: AC | PRN
  Administered 2018-04-01: 500 [IU]
  Filled 2018-04-01: qty 5

## 2018-04-01 MED ORDER — DIPHENHYDRAMINE HCL 25 MG PO CAPS
25.0000 mg | ORAL_CAPSULE | Freq: Once | ORAL | Status: AC
  Administered 2018-04-01: 25 mg via ORAL

## 2018-04-01 MED ORDER — SODIUM CHLORIDE 0.9% FLUSH
10.0000 mL | INTRAVENOUS | Status: DC | PRN
  Administered 2018-04-01: 10 mL
  Filled 2018-04-01: qty 10

## 2018-04-01 MED ORDER — DIPHENHYDRAMINE HCL 25 MG PO CAPS
ORAL_CAPSULE | ORAL | Status: AC
  Filled 2018-04-01: qty 1

## 2018-04-01 MED ORDER — ACETAMINOPHEN 325 MG PO TABS
650.0000 mg | ORAL_TABLET | Freq: Once | ORAL | Status: AC
  Administered 2018-04-01: 650 mg via ORAL

## 2018-04-01 MED ORDER — ACETAMINOPHEN 325 MG PO TABS
ORAL_TABLET | ORAL | Status: AC
  Filled 2018-04-01: qty 2

## 2018-04-01 NOTE — Patient Instructions (Signed)
Coronavirus (COVID-19) Are you at risk?  Are you at risk for the Coronavirus (COVID-19)?  To be considered HIGH RISK for Coronavirus (COVID-19), you have to meet the following criteria:  . Traveled to [place], [place], [place], [place] or [place]; or in the [place] to [place], [place], [place], or [place]; and have fever, cough, and shortness of breath within the last 2 weeks of travel OR . Been in close contact with a person diagnosed with COVID-19 within the last 2 weeks and have fever, cough, and shortness of breath . IF YOU DO NOT MEET THESE CRITERIA, YOU ARE CONSIDERED LOW RISK FOR COVID-19.  What to do if you are HIGH RISK for COVID-19?  . If you are having a medical emergency, call 911. . Seek medical care right away. Before you go to a doctor's office, urgent care or emergency department, call ahead and tell them about your recent travel, contact with someone diagnosed with COVID-19, and your symptoms. You should receive instructions from your physician's office regarding next steps of care.  . When you arrive at healthcare provider, tell the healthcare staff immediately you have returned from visiting [place], [place], [place], [place] or [place]; or traveled in the [place] to [place], [place], [place], or [place]; in the last two weeks or you have been in close contact with a person diagnosed with COVID-19 in the last 2 weeks.   . Tell the health care staff about your symptoms: fever, cough and shortness of breath. . After you have been seen by a medical provider, you will be either: o Tested for (COVID-19) and discharged home on quarantine except to seek medical care if symptoms worsen, and asked to  - Stay home and avoid contact with others until you get your results (4-5 days)  - Avoid travel on public transportation if possible (such as bus, train, or airplane) or o Sent to the Emergency Department by EMS for evaluation, COVID-19 testing, and possible  admission depending on your condition and test results.  What to do if you are LOW RISK for COVID-19?  Reduce your risk of any infection by using the same precautions used for avoiding the common cold or flu:  . Wash your hands often with soap and warm water for at least 20 seconds.  If soap and water are not readily available, use an alcohol-based hand sanitizer with at least 60% alcohol.  . If coughing or sneezing, cover your mouth and nose by coughing or sneezing into the elbow areas of your shirt or coat, into a tissue or into your sleeve (not your hands). . Avoid shaking hands with others and consider head nods or verbal greetings only. . Avoid touching your eyes, nose, or mouth with unwashed hands.  . Avoid close contact with people who are sick. . Avoid places or events with large numbers of people in one location, like concerts or sporting events. . Carefully consider travel plans you have or are making. . If you are planning any travel outside or inside the [place], visit the CDC's Travelers' Health webpage for the latest health notices. . If you have some symptoms but not all symptoms, continue to monitor at home and seek medical attention if your symptoms worsen. . If you are having a medical emergency, call 911.   ADDITIONAL HEALTHCARE OPTIONS FOR PATIENTS  [facility] / e-Visit: [email or web address]         [facility]: [phone]  [facility]: [phone]                   [facility]: [phone]    [facility] Discharge Instructions for Patients Receiving Chemotherapy  Today you received the following chemotherapy agents Herceptin   To help prevent nausea and vomiting after your treatment, we encourage you to take your nausea medication as directed.    If you develop nausea and vomiting that is not controlled by your nausea medication, call the clinic.   BELOW  ARE SYMPTOMS THAT SHOULD BE REPORTED IMMEDIATELY:  *FEVER GREATER THAN 100.5 F  *CHILLS WITH OR WITHOUT FEVER  NAUSEA AND VOMITING THAT IS NOT CONTROLLED WITH YOUR NAUSEA MEDICATION  *UNUSUAL SHORTNESS OF BREATH  *UNUSUAL BRUISING OR BLEEDING  TENDERNESS IN MOUTH AND THROAT WITH OR WITHOUT PRESENCE OF ULCERS  *URINARY PROBLEMS  *BOWEL PROBLEMS  UNUSUAL RASH Items with * indicate a potential emergency and should be followed up as soon as possible.  Feel free to call the clinic should you have any questions or concerns. The clinic phone number is [phone].  Please show the CHEMO ALERT CARD at check-in to the Emergency Department and triage nurse.   

## 2018-04-08 NOTE — Progress Notes (Signed)
Virtual Visit via Telephone Note  I connected with Tara Cox on 04/08/18 at 10:15 AM EDT by telephone and verified that I am speaking with the correct person using two identifiers.   I discussed the limitations, risks, security and privacy concerns of performing an evaluation and management service by telephone and the availability of in person appointments. I also discussed with the patient that there may be a patient responsible charge related to this service. The patient expressed understanding and agreed to proceed.  CC: Intermittent joint pain   History of Present Illness: Patient is a 84 year old female with a past medical history of seronegative rheumatoid arthritis, CPPD, and osteoarthritis.  She is taking PLQ 200 mg 1 tablet po daily.  She takes colchicine 0.6 mg 1 tablet by mouth daily for CPPD. She has intermittent left wrist pain and swelling.  She continues to have intermittent knee joint pain.  She continues to follow up with Dr. Ronnie Derby. She uses voltaren gel topically which is effective for pain relief.    Review of Systems  Constitutional: Negative for fever and malaise/fatigue.  HENT:       + mouth  dryness  Eyes: Negative for photophobia, pain, discharge and redness.  Respiratory: Negative for cough, shortness of breath and wheezing.   Cardiovascular: Negative for chest pain and palpitations.  Gastrointestinal: Negative for blood in stool, constipation and diarrhea.  Genitourinary: Negative for dysuria.  Musculoskeletal: Positive for joint pain. Negative for back pain, myalgias and neck pain.  Skin: Negative for rash.  Neurological: Negative for dizziness and headaches.  Psychiatric/Behavioral: Negative for depression. The patient is nervous/anxious. The patient does not have insomnia.    Observations/Objective: Physical Exam  Constitutional: She is oriented to person, place, and time.  Neurological: She is alert and oriented to person, place, and time.   Psychiatric: Mood, memory, affect and judgment normal.   Patient reports morning stiffness for 9minutes.   Patient reports nocturnal pain.  Difficulty dressing/grooming: Denies Difficulty climbing stairs: Reports Difficulty getting out of chair: Reports Difficulty using hands for taps, buttons, cutlery, and/or writing: Denies  Assessment and Plan: Rheumatoid arthritis of multiple sites with negative rheumatoid factor-She has not had any recent flares.  She has intermittent left wrist pain.  She uses voltaren gel topically for pain relief.  She is clinically doing well on PLQ 200 mg 1 tablet po daily. She does not need any refills at this time.  She was advised to notify us if she develops increased joint pain or joint swelling. She will follow up in 4 months.   Calcium pyrophosphate deposition disease-She has not had any  flares since she taking colchicine.  She is taking Colchicine 0.6 mg 1 tablet by mouth daily.   High risk medication use - Current regimen includes Plaquenil 200 mg 1 tablet po daily. Last PLQ eye exam within normal limits on 08/16/2017.  She had CBC and CMP drawn on 04/01/18.  WBC count was low-3.6 but rest of lab work was WNL.   A refill of PLQ was sent on 03/27/18.    Primary osteoarthritis of both hands-She has no joint pain or joint swelling in her hands at this time.  She has no difficulty with ADLs. Joint protection and muscle strengthening were discussed.   Primary osteoarthritis of both feet-She has chronic pain in both feet, especially at night.  She uses voltaren gel topically for pain relief.   History of total right knee replacement - Performed by Dr. Ronnie Derby.  She has intermittent discomfort. She uses voltaren gel topically PRN for pain relief. She experiences stiffness in both knees after sitting/laying for prolonged periods of time.   DDD (degenerative disc disease), lumbar-She has no lower back pain at this time.  Osteopenia-last bone density screening on  10/31/2017, showed a T-score of -1.5, which is considered osteopenic.  Use of calcium vitamin D and resistive exercises were discussed.  History of breast cancer- right breast.She is followed by Dr. Jana Hakim. Her current treatment includes: Anastrozole, Trastuzumab, and Rivaroxaban.  She was diagnosed with a left jugular and left subclavin DVT on 01/17/18 while taking warfarin.   She discontinued warfarin and was switched to xareltoat that time.    Follow Up Instructions: She will follow up in 4 months.    I discussed the assessment and treatment plan with the patient. The patient was provided an opportunity to ask questions and all were answered. The patient agreed with the plan and demonstrated an understanding of the instructions.   The patient was advised to call back or seek an in-person evaluation if the symptoms worsen or if the condition fails to improve as anticipated.  I provided 22 minutes of non-face-to-face time during this encounter. Bo Merino, MD   Scribed by-  Ofilia Neas, PA-C

## 2018-04-14 ENCOUNTER — Telehealth (INDEPENDENT_AMBULATORY_CARE_PROVIDER_SITE_OTHER): Payer: Medicare Other | Admitting: Rheumatology

## 2018-04-14 ENCOUNTER — Encounter: Payer: Self-pay | Admitting: Rheumatology

## 2018-04-14 DIAGNOSIS — M5136 Other intervertebral disc degeneration, lumbar region: Secondary | ICD-10-CM

## 2018-04-14 DIAGNOSIS — M19072 Primary osteoarthritis, left ankle and foot: Secondary | ICD-10-CM

## 2018-04-14 DIAGNOSIS — R600 Localized edema: Secondary | ICD-10-CM

## 2018-04-14 DIAGNOSIS — E7800 Pure hypercholesterolemia, unspecified: Secondary | ICD-10-CM

## 2018-04-14 DIAGNOSIS — M722 Plantar fascial fibromatosis: Secondary | ICD-10-CM

## 2018-04-14 DIAGNOSIS — M19042 Primary osteoarthritis, left hand: Secondary | ICD-10-CM

## 2018-04-14 DIAGNOSIS — Z170 Estrogen receptor positive status [ER+]: Secondary | ICD-10-CM

## 2018-04-14 DIAGNOSIS — M19071 Primary osteoarthritis, right ankle and foot: Secondary | ICD-10-CM

## 2018-04-14 DIAGNOSIS — C50111 Malignant neoplasm of central portion of right female breast: Secondary | ICD-10-CM

## 2018-04-14 DIAGNOSIS — M0609 Rheumatoid arthritis without rheumatoid factor, multiple sites: Secondary | ICD-10-CM | POA: Diagnosis not present

## 2018-04-14 DIAGNOSIS — I10 Essential (primary) hypertension: Secondary | ICD-10-CM

## 2018-04-14 DIAGNOSIS — Z79899 Other long term (current) drug therapy: Secondary | ICD-10-CM

## 2018-04-14 DIAGNOSIS — M8589 Other specified disorders of bone density and structure, multiple sites: Secondary | ICD-10-CM

## 2018-04-14 DIAGNOSIS — Z96651 Presence of right artificial knee joint: Secondary | ICD-10-CM

## 2018-04-14 DIAGNOSIS — I480 Paroxysmal atrial fibrillation: Secondary | ICD-10-CM

## 2018-04-14 DIAGNOSIS — M19041 Primary osteoarthritis, right hand: Secondary | ICD-10-CM

## 2018-04-14 DIAGNOSIS — Z86718 Personal history of other venous thrombosis and embolism: Secondary | ICD-10-CM

## 2018-04-14 DIAGNOSIS — M1120 Other chondrocalcinosis, unspecified site: Secondary | ICD-10-CM

## 2018-04-15 ENCOUNTER — Telehealth: Payer: Self-pay | Admitting: Rheumatology

## 2018-04-15 NOTE — Telephone Encounter (Signed)
-----   Message from [place] sent at 04/14/2018 11:43 AM EDT ----- Patient had a virtual visit today with Dr. Estanislado Pandy. Please call to schedule 4 month follow up. Thanks!

## 2018-04-15 NOTE — Telephone Encounter (Signed)
I LMOM for patient to call, and schedule a 4 month rov with Dr. Estanislado Pandy.

## 2018-04-18 ENCOUNTER — Ambulatory Visit (HOSPITAL_COMMUNITY): Payer: Medicare Other

## 2018-04-18 ENCOUNTER — Encounter (HOSPITAL_COMMUNITY): Payer: Medicare Other | Admitting: Cardiology

## 2018-04-22 ENCOUNTER — Inpatient Hospital Stay: Payer: Medicare Other

## 2018-04-22 ENCOUNTER — Other Ambulatory Visit: Payer: Self-pay

## 2018-04-22 ENCOUNTER — Inpatient Hospital Stay: Payer: Medicare Other | Attending: Oncology

## 2018-04-22 VITALS — BP 170/81 | HR 83 | Temp 98.4°F | Resp 17

## 2018-04-22 DIAGNOSIS — C50111 Malignant neoplasm of central portion of right female breast: Secondary | ICD-10-CM | POA: Diagnosis present

## 2018-04-22 DIAGNOSIS — Z170 Estrogen receptor positive status [ER+]: Secondary | ICD-10-CM | POA: Insufficient documentation

## 2018-04-22 DIAGNOSIS — Z5112 Encounter for antineoplastic immunotherapy: Secondary | ICD-10-CM | POA: Diagnosis present

## 2018-04-22 DIAGNOSIS — Z79811 Long term (current) use of aromatase inhibitors: Secondary | ICD-10-CM | POA: Insufficient documentation

## 2018-04-22 DIAGNOSIS — C50911 Malignant neoplasm of unspecified site of right female breast: Secondary | ICD-10-CM

## 2018-04-22 DIAGNOSIS — Z95828 Presence of other vascular implants and grafts: Secondary | ICD-10-CM

## 2018-04-22 LAB — COMPREHENSIVE METABOLIC PANEL
ALT: 33 U/L (ref 0–44)
AST: 28 U/L (ref 15–41)
Albumin: 3.8 g/dL (ref 3.5–5.0)
Alkaline Phosphatase: 91 U/L (ref 38–126)
Anion gap: 12 (ref 5–15)
BUN: 14 mg/dL (ref 8–23)
CO2: 28 mmol/L (ref 22–32)
Calcium: 9.5 mg/dL (ref 8.9–10.3)
Chloride: 100 mmol/L (ref 98–111)
Creatinine, Ser: 0.92 mg/dL (ref 0.44–1.00)
GFR calc Af Amer: >60 mL/min (ref >60)
GFR calc non Af Amer: 58 mL/min — ABNORMAL LOW (ref >60)
Glucose, Bld: 90 mg/dL (ref 70–99)
Potassium: 3.7 mmol/L (ref 3.5–5.1)
Sodium: 140 mmol/L (ref 135–145)
Total Bilirubin: 0.6 mg/dL (ref 0.3–1.2)
Total Protein: 6.8 g/dL (ref 6.5–8.1)

## 2018-04-22 LAB — CBC WITH DIFFERENTIAL/PLATELET
Abs Immature Granulocytes: 0 10*3/uL (ref 0.00–0.07)
Basophils Absolute: 0 10*3/uL (ref 0.0–0.1)
Basophils Relative: 1 %
Eosinophils Absolute: 0.1 10*3/uL (ref 0.0–0.5)
Eosinophils Relative: 1 %
HCT: 40.8 % (ref 36.0–46.0)
Hemoglobin: 13.2 g/dL (ref 12.0–15.0)
Immature Granulocytes: 0 %
Lymphocytes Relative: 43 %
Lymphs Abs: 1.7 10*3/uL (ref 0.7–4.0)
MCH: 27.2 pg (ref 26.0–34.0)
MCHC: 32.4 g/dL (ref 30.0–36.0)
MCV: 84.1 fL (ref 80.0–100.0)
Monocytes Absolute: 0.4 10*3/uL (ref 0.1–1.0)
Monocytes Relative: 9 %
Neutro Abs: 1.8 10*3/uL (ref 1.7–7.7)
Neutrophils Relative %: 46 %
Platelets: 149 10*3/uL — ABNORMAL LOW (ref 150–400)
RBC: 4.85 MIL/uL (ref 3.87–5.11)
RDW: 13.2 % (ref 11.5–15.5)
WBC: 3.8 10*3/uL — ABNORMAL LOW (ref 4.0–10.5)
nRBC: 0 % (ref 0.0–0.2)

## 2018-04-22 MED ORDER — DIPHENHYDRAMINE HCL 25 MG PO CAPS
25.0000 mg | ORAL_CAPSULE | Freq: Once | ORAL | Status: AC
  Administered 2018-04-22: 25 mg via ORAL

## 2018-04-22 MED ORDER — HEPARIN SOD (PORK) LOCK FLUSH 100 UNIT/ML IV SOLN
500.0000 [IU] | Freq: Once | INTRAVENOUS | Status: AC | PRN
  Administered 2018-04-22: 14:00:00 500 [IU]
  Filled 2018-04-22: qty 5

## 2018-04-22 MED ORDER — ACETAMINOPHEN 325 MG PO TABS
650.0000 mg | ORAL_TABLET | Freq: Once | ORAL | Status: AC
  Administered 2018-04-22: 650 mg via ORAL

## 2018-04-22 MED ORDER — SODIUM CHLORIDE 0.9% FLUSH
10.0000 mL | INTRAVENOUS | Status: DC | PRN
  Administered 2018-04-22: 10 mL
  Filled 2018-04-22: qty 10

## 2018-04-22 MED ORDER — SODIUM CHLORIDE 0.9 % IV SOLN
Freq: Once | INTRAVENOUS | Status: AC
  Administered 2018-04-22: 13:00:00 via INTRAVENOUS
  Filled 2018-04-22: qty 250

## 2018-04-22 MED ORDER — TRASTUZUMAB CHEMO 150 MG IV SOLR
6.0000 mg/kg | Freq: Once | INTRAVENOUS | Status: AC
  Administered 2018-04-22: 651 mg via INTRAVENOUS
  Filled 2018-04-22: qty 31

## 2018-04-22 MED ORDER — ACETAMINOPHEN 325 MG PO TABS
ORAL_TABLET | ORAL | Status: AC
  Filled 2018-04-22: qty 2

## 2018-04-22 MED ORDER — DIPHENHYDRAMINE HCL 25 MG PO CAPS
ORAL_CAPSULE | ORAL | Status: AC
  Filled 2018-04-22: qty 1

## 2018-04-22 NOTE — Patient Instructions (Signed)
[facility] Discharge Instructions for Patients Receiving Chemotherapy  Today you received the following chemotherapy agents Herceptin  To help prevent nausea and vomiting after your treatment, we encourage you to take your nausea medication as directed   If you develop nausea and vomiting that is not controlled by your nausea medication, call the clinic.   BELOW ARE SYMPTOMS THAT SHOULD BE REPORTED IMMEDIATELY:  *FEVER GREATER THAN 100.5 F  *CHILLS WITH OR WITHOUT FEVER  NAUSEA AND VOMITING THAT IS NOT CONTROLLED WITH YOUR NAUSEA MEDICATION  *UNUSUAL SHORTNESS OF BREATH  *UNUSUAL BRUISING OR BLEEDING  TENDERNESS IN MOUTH AND THROAT WITH OR WITHOUT PRESENCE OF ULCERS  *URINARY PROBLEMS  *BOWEL PROBLEMS  UNUSUAL RASH Items with * indicate a potential emergency and should be followed up as soon as possible.  Feel free to call the clinic should you have any questions or concerns. The clinic phone number is [phone].  Please show the CHEMO ALERT CARD at check-in to the Emergency Department and triage nurse.   

## 2018-05-09 ENCOUNTER — Encounter: Payer: Self-pay | Admitting: General Practice

## 2018-05-09 NOTE — Progress Notes (Signed)
[facility] Support Services   [facility] Support Services Team contacted patient to assess for food insecurity and other psychosocial needs during current COVID19 pandemic.    Patient/family expressed no needs at this time.  Support Team member encouraged patient to call if changes occur or they have any other questions/concerns.    Maui Britten C Seabron Iannello, LCSW  [facility]        

## 2018-05-13 ENCOUNTER — Inpatient Hospital Stay: Payer: Medicare Other | Attending: Oncology

## 2018-05-13 ENCOUNTER — Other Ambulatory Visit: Payer: Self-pay

## 2018-05-13 ENCOUNTER — Inpatient Hospital Stay: Payer: Medicare Other

## 2018-05-13 ENCOUNTER — Telehealth: Payer: Self-pay | Admitting: Adult Health

## 2018-05-13 ENCOUNTER — Encounter: Payer: Self-pay | Admitting: *Deleted

## 2018-05-13 VITALS — BP 143/77 | HR 70 | Temp 98.2°F | Resp 16

## 2018-05-13 DIAGNOSIS — C50111 Malignant neoplasm of central portion of right female breast: Secondary | ICD-10-CM

## 2018-05-13 DIAGNOSIS — Z170 Estrogen receptor positive status [ER+]: Secondary | ICD-10-CM

## 2018-05-13 DIAGNOSIS — Z5112 Encounter for antineoplastic immunotherapy: Secondary | ICD-10-CM | POA: Insufficient documentation

## 2018-05-13 DIAGNOSIS — M542 Cervicalgia: Secondary | ICD-10-CM

## 2018-05-13 DIAGNOSIS — C50911 Malignant neoplasm of unspecified site of right female breast: Secondary | ICD-10-CM

## 2018-05-13 DIAGNOSIS — Z95828 Presence of other vascular implants and grafts: Secondary | ICD-10-CM

## 2018-05-13 LAB — COMPREHENSIVE METABOLIC PANEL
ALT: 28 U/L (ref 0–44)
AST: 25 U/L (ref 15–41)
Albumin: 3.9 g/dL (ref 3.5–5.0)
Alkaline Phosphatase: 89 U/L (ref 38–126)
Anion gap: 9 (ref 5–15)
BUN: 16 mg/dL (ref 8–23)
CO2: 31 mmol/L (ref 22–32)
Calcium: 9.9 mg/dL (ref 8.9–10.3)
Chloride: 103 mmol/L (ref 98–111)
Creatinine, Ser: 0.95 mg/dL (ref 0.44–1.00)
GFR calc Af Amer: >60 mL/min (ref >60)
GFR calc non Af Amer: 56 mL/min — ABNORMAL LOW (ref >60)
Glucose, Bld: 104 mg/dL — ABNORMAL HIGH (ref 70–99)
Potassium: 3.7 mmol/L (ref 3.5–5.1)
Sodium: 143 mmol/L (ref 135–145)
Total Bilirubin: 0.6 mg/dL (ref 0.3–1.2)
Total Protein: 6.7 g/dL (ref 6.5–8.1)

## 2018-05-13 LAB — CBC WITH DIFFERENTIAL/PLATELET
Abs Immature Granulocytes: 0 10*3/uL (ref 0.00–0.07)
Basophils Absolute: 0 10*3/uL (ref 0.0–0.1)
Basophils Relative: 1 %
Eosinophils Absolute: 0.1 10*3/uL (ref 0.0–0.5)
Eosinophils Relative: 1 %
HCT: 41.5 % (ref 36.0–46.0)
Hemoglobin: 12.9 g/dL (ref 12.0–15.0)
Immature Granulocytes: 0 %
Lymphocytes Relative: 42 %
Lymphs Abs: 1.5 10*3/uL (ref 0.7–4.0)
MCH: 26.8 pg (ref 26.0–34.0)
MCHC: 31.1 g/dL (ref 30.0–36.0)
MCV: 86.3 fL (ref 80.0–100.0)
Monocytes Absolute: 0.4 10*3/uL (ref 0.1–1.0)
Monocytes Relative: 11 %
Neutro Abs: 1.6 10*3/uL — ABNORMAL LOW (ref 1.7–7.7)
Neutrophils Relative %: 45 %
Platelets: 165 10*3/uL (ref 150–400)
RBC: 4.81 MIL/uL (ref 3.87–5.11)
RDW: 13.7 % (ref 11.5–15.5)
WBC: 3.7 10*3/uL — ABNORMAL LOW (ref 4.0–10.5)
nRBC: 0 % (ref 0.0–0.2)

## 2018-05-13 MED ORDER — DIPHENHYDRAMINE HCL 25 MG PO CAPS
ORAL_CAPSULE | ORAL | Status: AC
  Filled 2018-05-13: qty 1

## 2018-05-13 MED ORDER — TRASTUZUMAB CHEMO 150 MG IV SOLR
6.0000 mg/kg | Freq: Once | INTRAVENOUS | Status: AC
  Administered 2018-05-13: 651 mg via INTRAVENOUS
  Filled 2018-05-13: qty 31

## 2018-05-13 MED ORDER — SODIUM CHLORIDE 0.9% FLUSH
10.0000 mL | INTRAVENOUS | Status: DC | PRN
  Administered 2018-05-13: 11:00:00 10 mL
  Filled 2018-05-13: qty 10

## 2018-05-13 MED ORDER — ACETAMINOPHEN 325 MG PO TABS
ORAL_TABLET | ORAL | Status: AC
  Filled 2018-05-13: qty 2

## 2018-05-13 MED ORDER — SODIUM CHLORIDE 0.9% FLUSH
10.0000 mL | INTRAVENOUS | Status: DC | PRN
  Administered 2018-05-13: 10 mL
  Filled 2018-05-13: qty 10

## 2018-05-13 MED ORDER — SODIUM CHLORIDE 0.9 % IV SOLN
Freq: Once | INTRAVENOUS | Status: AC
  Administered 2018-05-13: 12:00:00 via INTRAVENOUS
  Filled 2018-05-13: qty 250

## 2018-05-13 MED ORDER — HEPARIN SOD (PORK) LOCK FLUSH 100 UNIT/ML IV SOLN
500.0000 [IU] | Freq: Once | INTRAVENOUS | Status: AC | PRN
  Administered 2018-05-13: 500 [IU]
  Filled 2018-05-13: qty 5

## 2018-05-13 MED ORDER — DIPHENHYDRAMINE HCL 25 MG PO CAPS
25.0000 mg | ORAL_CAPSULE | Freq: Once | ORAL | Status: AC
  Administered 2018-05-13: 25 mg via ORAL

## 2018-05-13 MED ORDER — ACETAMINOPHEN 325 MG PO TABS
650.0000 mg | ORAL_TABLET | Freq: Once | ORAL | Status: AC
  Administered 2018-05-13: 650 mg via ORAL

## 2018-05-13 NOTE — Patient Instructions (Signed)
[facility] Discharge Instructions for Patients Receiving Chemotherapy  Today you received the following chemotherapy agents Herceptin  To help prevent nausea and vomiting after your treatment, we encourage you to take your nausea medication as directed   If you develop nausea and vomiting that is not controlled by your nausea medication, call the clinic.   BELOW ARE SYMPTOMS THAT SHOULD BE REPORTED IMMEDIATELY:  *FEVER GREATER THAN 100.5 F  *CHILLS WITH OR WITHOUT FEVER  NAUSEA AND VOMITING THAT IS NOT CONTROLLED WITH YOUR NAUSEA MEDICATION  *UNUSUAL SHORTNESS OF BREATH  *UNUSUAL BRUISING OR BLEEDING  TENDERNESS IN MOUTH AND THROAT WITH OR WITHOUT PRESENCE OF ULCERS  *URINARY PROBLEMS  *BOWEL PROBLEMS  UNUSUAL RASH Items with * indicate a potential emergency and should be followed up as soon as possible.  Feel free to call the clinic should you have any questions or concerns. The clinic phone number is [phone].  Please show the CHEMO ALERT CARD at check-in to the Emergency Department and triage nurse.   

## 2018-05-13 NOTE — Telephone Encounter (Signed)
Scheduled SCP per sch msg. Mailed printout

## 2018-05-30 ENCOUNTER — Ambulatory Visit (HOSPITAL_BASED_OUTPATIENT_CLINIC_OR_DEPARTMENT_OTHER)
Admission: RE | Admit: 2018-05-30 | Discharge: 2018-05-30 | Disposition: A | Discharge status: Home / Self Care | Payer: Medicare Other | Source: Ambulatory Visit | Attending: Cardiology | Admitting: Cardiology

## 2018-05-30 ENCOUNTER — Ambulatory Visit (HOSPITAL_COMMUNITY)
Admission: RE | Admit: 2018-05-30 | Discharge: 2018-05-30 | Disposition: A | Discharge status: Home / Self Care | Payer: Medicare Other | Source: Ambulatory Visit | Attending: Cardiology | Admitting: Cardiology

## 2018-05-30 ENCOUNTER — Encounter (HOSPITAL_COMMUNITY): Payer: Medicare Other | Admitting: Cardiology

## 2018-05-30 ENCOUNTER — Other Ambulatory Visit: Payer: Self-pay

## 2018-05-30 DIAGNOSIS — K589 Irritable bowel syndrome without diarrhea: Secondary | ICD-10-CM | POA: Diagnosis not present

## 2018-05-30 DIAGNOSIS — Z87891 Personal history of nicotine dependence: Secondary | ICD-10-CM | POA: Diagnosis not present

## 2018-05-30 DIAGNOSIS — Z7901 Long term (current) use of anticoagulants: Secondary | ICD-10-CM | POA: Insufficient documentation

## 2018-05-30 DIAGNOSIS — C50911 Malignant neoplasm of unspecified site of right female breast: Secondary | ICD-10-CM

## 2018-05-30 DIAGNOSIS — I480 Paroxysmal atrial fibrillation: Secondary | ICD-10-CM | POA: Diagnosis not present

## 2018-05-30 DIAGNOSIS — I509 Heart failure, unspecified: Secondary | ICD-10-CM | POA: Insufficient documentation

## 2018-05-30 DIAGNOSIS — Z853 Personal history of malignant neoplasm of breast: Secondary | ICD-10-CM | POA: Diagnosis not present

## 2018-05-30 DIAGNOSIS — E785 Hyperlipidemia, unspecified: Secondary | ICD-10-CM | POA: Insufficient documentation

## 2018-05-30 DIAGNOSIS — Z7982 Long term (current) use of aspirin: Secondary | ICD-10-CM | POA: Insufficient documentation

## 2018-05-30 DIAGNOSIS — I340 Nonrheumatic mitral (valve) insufficiency: Secondary | ICD-10-CM | POA: Diagnosis not present

## 2018-05-30 DIAGNOSIS — I471 Supraventricular tachycardia: Secondary | ICD-10-CM | POA: Diagnosis not present

## 2018-05-30 DIAGNOSIS — Z86718 Personal history of other venous thrombosis and embolism: Secondary | ICD-10-CM | POA: Insufficient documentation

## 2018-05-30 DIAGNOSIS — I110 Hypertensive heart disease with heart failure: Secondary | ICD-10-CM | POA: Insufficient documentation

## 2018-05-30 DIAGNOSIS — Z79899 Other long term (current) drug therapy: Secondary | ICD-10-CM | POA: Insufficient documentation

## 2018-05-30 DIAGNOSIS — Z9011 Acquired absence of right breast and nipple: Secondary | ICD-10-CM | POA: Insufficient documentation

## 2018-05-30 DIAGNOSIS — K219 Gastro-esophageal reflux disease without esophagitis: Secondary | ICD-10-CM | POA: Insufficient documentation

## 2018-05-30 DIAGNOSIS — Z170 Estrogen receptor positive status [ER+]: Secondary | ICD-10-CM | POA: Insufficient documentation

## 2018-05-30 DIAGNOSIS — Z923 Personal history of irradiation: Secondary | ICD-10-CM | POA: Diagnosis not present

## 2018-05-30 DIAGNOSIS — Z8249 Family history of ischemic heart disease and other diseases of the circulatory system: Secondary | ICD-10-CM | POA: Insufficient documentation

## 2018-05-30 DIAGNOSIS — K625 Hemorrhage of anus and rectum: Secondary | ICD-10-CM | POA: Diagnosis not present

## 2018-06-02 NOTE — Progress Notes (Signed)
Heart Failure TeleHealth Note  Due to national recommendations of social distancing due to [place] 19, Audio/video telehealth visit is felt to be most appropriate for this patient at this time.  See MyChart message from today for patient consent regarding telehealth for [facility].  Date:  06/02/2018   ID:  Erik Nessel, DOB 19-Jan-1934, MRN [id number]  Location: Home  Provider location: [facility] Type of Visit: Established patient  PCP:  [name], MD  Oncology: Dr. Jana Hakim  Chief Complaint: Rectal bleeding   History of Present Illness: Jackelyn Illingworth is a 84 y.o. female who presents via audio/video conferencing for a telehealth visit today.     she denies symptoms worrisome for COVID 19.   Patient has a history of pseudogout, paroxysmal atrial fibrillation, osteoarthritis, and breast cancer.  She was referred by Dr. Jana Hakim for cardio-oncology evaluation.    She was diagnosed in 3/19 with right breast cancer, ER+/PR+/HER2+.  She had right mastectomy in 4/19 with Herceptin x 1 year starting afterwards.  She has now completed XRT.   She wore a Zio patch in 8/19 showing a few short runs of SVT, no atrial fibrillation.   In 1/20, she developed a LUE DVT. She was switched from warfarin to Xarelto.   She has now completed Herceptin. No chest pain or exertional dyspnea.  No palpitations.  She is on Xarelto and has noted some rectal bleeding that she attributes to hemorrhoidal bleeding.  Recent CBC showed stable hgb.     Labs (10/18): creatinine 0.91 Labs (5/20): hgb 12.9, K 3.7, creatinine 0.95  PMH: 1. CPPD/pseudogout 2. GERD 3. Degenerative disc disease s/p discectomy 4. Right TKR 5. HTN 6. IBS 7. Hyperlipidemia 8. Atrial fibrillation: Paroxysmal, she is on warfarin.  - Zio patch (8/19): shorts runs SVT, no atrial fibrillation.  9. Cardiolite 12/14 was normal. 10. Breast cancer: Diagnosed 3/19 with right breast  cancer, ER+/PR+/HER2+.  Right mastectomy in 4/19 with Herceptin x 1 year afterwards.  Completed XRT.  - Echo (4/19): EF 60-65%, mild focal basal septal hypertrophy, GLS -18.1%, normal RV size and systolic function.  - Echo (8/19): EF 60-65%, GLS -79.0%, grade 2 diastolic dysfunction, normal RV size and systolic function, mild MR.  - Echo (11/19): EF 60-65%, GLS -19.4%, normal RV size/function - Echo (2/20): EF 60-65%, GLS -24.0%, grade 2 diastolic dysfunction, normal RV size and systolic function.  - Echo (5/20): EF 60-65%, GLS -23.2%.  11. LUE DVT  Current Outpatient Medications  Medication Sig Dispense Refill  . acetaminophen (TYLENOL) 650 MG CR tablet Take 650 mg by mouth every 8 (eight) hours as needed for pain.    Marland Kitchen anastrozole (ARIMIDEX) 1 MG tablet Take 1 tablet (1 mg total) by mouth daily. 90 tablet 4  . Ascorbic Acid (VITAMIN C WITH ROSE HIPS) 500 MG tablet Take 500 mg by mouth daily.    Marland Kitchen aspirin-sod bicarb-citric acid (ALKA-SELTZER) 325 MG TBEF tablet Take 325 mg by mouth every 6 (six) hours as needed.    Marland Kitchen atorvastatin (LIPITOR) 20 MG tablet Take 20 mg by mouth at bedtime.     . Calcium Carbonate-Vit D-Min (CALCIUM 600+D3 PLUS MINERALS) 600-800 MG-UNIT TABS Take 1 tablet by mouth at bedtime.    . Cholecalciferol (VITAMIN D) 2000 units CAPS Take 2,000 Units by mouth daily. Taking Vitamin D 3 2000 units daliy    . colchicine 0.6 MG tablet TAKE 1 TABLET BY MOUTH  DAILY 90 tablet 0  .  Diclofenac Sodium 1 % CREA Apply topically.    . furosemide (LASIX) 20 MG tablet Take 20 mg by mouth daily.      Marland Kitchen gabapentin (NEURONTIN) 300 MG capsule Take 300 mg by mouth 2 (two) times daily.     . hydrochlorothiazide (HYDRODIURIL) 25 MG tablet Take 25 mg by mouth daily.  1  . HYDROcodone-acetaminophen (NORCO/VICODIN) 5-325 MG tablet Take 1 tablet by mouth every 6 (six) hours as needed for moderate pain. 20 tablet 0  . hydrocortisone (ANUSOL-HC) 2.5 % rectal cream Place 1 application rectally 2 (two)  times daily as needed for hemorrhoids or itching.    . hydroxychloroquine (PLAQUENIL) 200 MG tablet TAKE 1 TABLET BY MOUTH DAILY 90 tablet 1  . lidocaine-prilocaine (EMLA) cream Apply 1 application topically as needed. 30 g 0  . losartan (COZAAR) 100 MG tablet Take 100 mg by mouth daily.  1  . magnesium oxide (MAG-OX) 400 MG tablet Take 400 mg by mouth daily.     . metoprolol succinate (TOPROL-XL) 50 MG 24 hr tablet Take 1 tablet (50 mg total) by mouth daily. Take with or immediately following a meal. 30 tablet 6  . Multiple Vitamin (MULTIVITAMIN) tablet Take 1 tablet by mouth daily. CENTRUM    . omeprazole (PRILOSEC) 40 MG capsule Take 40 mg by mouth daily as needed (FOR ACID REFLUX/INDIGESTION).   12  . potassium chloride SA (K-DUR,KLOR-CON) 20 MEQ tablet Take 10 mEq by mouth daily.    . rivaroxaban (XARELTO) 20 MG TABS tablet Take 1 tablet (20 mg total) by mouth daily with supper. 90 tablet 1  . Trastuzumab (HERCEPTIN IV) Inject into the vein every 21 ( twenty-one) days.    Marland Kitchen triamcinolone cream (KENALOG) 0.1 % Apply 1 application topically 2 (two) times daily as needed (Vaginal Itching).     No current facility-administered medications for this encounter.     Allergies:   Oxycodone; Hydrocodone; Percocet [oxycodone-acetaminophen]; Soma [carisoprodol]; and Ultram [tramadol hcl]   Social History:  The patient  reports that she quit smoking about 28 years ago. Her smoking use included cigarettes. She has a 20.00 pack-year smoking history. She has never used smokeless tobacco. She reports that she does not drink alcohol or use drugs.   Family History:  The patient's family history includes Diabetes in her sister and sister; Emphysema in her mother; Heart disease in her father and sister; Other in her mother and sister; Varicose Veins in her mother.   ROS:  Please see the history of present illness.   All other systems are personally reviewed and negative.   Exam:  (Video/Tele Health Call;  Exam is subjective and or/visual.) General:  Speaks in full sentences. No resp difficulty. Lungs: Normal respiratory effort with conversation.  Abdomen: Non-distended per patient report Extremities: Pt denies edema. Neuro: Alert & oriented x 3.   Recent Labs: 05/13/2018: ALT 28; BUN 16; Creatinine, Ser 0.95; Hemoglobin 12.9; Platelets 165; Potassium 3.7; Sodium 143  Personally reviewed   Wt Readings from Last 3 Encounters:  04/01/18 107.3 kg (236 lb 8 oz)  02/27/18 105.6 kg (232 lb 12.8 oz)  01/17/18 104.7 kg (230 lb 12.8 oz)      ASSESSMENT AND PLAN:  1. Breast cancer: She has completed Herceptin.  I reviewed today's echo, EF is 60-65% and strain is normal, more negative than on the prior study. No evidence for Herceptin-related cardiotoxicity, she does not need any more screening echoes.  2. Atrial fibrillation: Paroxysmal.  No palpitations.  Zio  patch in 8/19 showed a few short runs SVT.  - Continue Toprol XL.   - Continue Xarelto. She has had some rectal bleeding recently but hgb has been stable.  She will be following up with Dr. Benson Norway.  3. HTN: Controlled on current meds.  4. LUE DVT: Continue Xarelto.    COVID screen The patient does not have any symptoms that suggest any further testing/ screening at this time.  Social distancing reinforced today.  Patient Risk: After full review of this patients clinical status, I feel that they are at moderate risk for cardiac decompensation at this time.  Relevant cardiac medications were reviewed at length with the patient today. The patient does not have concerns regarding their medications at this time.   Recommended follow-up:  6 months  Today, I have spent 17 minutes with the patient with telehealth technology discussing the above issues .    Signed, Loralie Champagne, MD  06/02/2018  [facility] [address] [facility] [place] [address] [phone] (office) [phone]  (fax)

## 2018-06-04 ENCOUNTER — Other Ambulatory Visit: Payer: Self-pay | Admitting: Oncology

## 2018-06-16 ENCOUNTER — Other Ambulatory Visit: Payer: Self-pay | Admitting: Oncology

## 2018-06-16 ENCOUNTER — Ambulatory Visit: Payer: Self-pay | Admitting: Surgery

## 2018-06-16 NOTE — H&P (Signed)
Tara Cox Documented: 06/16/2018 12:09 PM Location: [facility] Patient #: [id number] DOB: 1934/03/15 Married / Language: English / Race: Black or African American Female  History of Present Illness Tara Cox A. Addi Pak MD; 06/16/2018 12:24 PM) Patient words: Patient returns after completion of chemotherapy. 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 She's now on blood thinners. She is due to have her Port-A-Cath removed after completion of chemotherapy.  The patient is a 84 year old female.   Allergies [name], CMA; 06/16/2018 12:09 PM) [name] *MUSCULOSKELETAL THERAPY AGENTS* Vomiting. Ultram *ANALGESICS - OPIOID* Vomiting. No Known Drug Allergies [04/17/2017]: (Marked as Inactive) Norco *ANALGESICS - OPIOID* Vomiting. Percocet *ANALGESICS - OPIOID* Itching. Allergies Reconciled  Medication History [name], CMA; 06/16/2018 12:09 PM) Atorvastatin Calcium (20MG  Tablet, Oral) Active. Colchicine (0.6MG  Tablet, Oral) Active. Furosemide (20MG  Tablet, Oral) Active. Gabapentin (300MG  Capsule, Oral) Active. Hydroxychloroquine Sulfate (200MG  Tablet, Oral) Active. Losartan Potassium-HCTZ (100-25MG  Tablet, Oral) Active. Metoprolol Tartrate (25MG  Tablet, Oral) Active. Omeprazole (40MG  Capsule DR, Oral) Active. Potassium Chloride Crys ER (20MEQ Tablet ER, Oral) Active. Warfarin Sodium (5MG  Tablet, Oral) Active. Benzonatate (200MG  Capsule, Oral) Active. Combination Attach Set #1100 Active. Benadryl Allergy (Oral) Specific strength unknown - Active. Tylenol Arthritis Ext Relief (Oral) Specific strength unknown - Active. Feverfew (Oral) Specific strength unknown - Active. Calcium (Oral) Specific strength unknown - Active. Centrum Adults (Oral) Active. Magnesium (Oral) Specific strength unknown - Active. Vitamin D3 (Oral) Specific strength unknown - Active. Vitamin C (Oral) Specific strength unknown -  Active. Medications Reconciled    Vitals ([name] CMA; 06/16/2018 12:10 PM) 06/16/2018 12:09 PM Weight: 237.38 lb Height: 67.5in Body Surface Area: 2.19 m Body Mass Index: 36.63 kg/m  Temp.: 39F(Temporal)  Pulse: 92 (Regular)  BP: 168/92 (Sitting, Left Arm, Standard)      Physical Exam ([name] MD; 06/16/2018 12:25 PM)  Head and Neck Head-normocephalic, atraumatic with no lesions or palpable masses.  Chest and Lung Exam Note: Port below right clavicle  Breast Note: Right Breast absent . No Masses    Assessment & Plan ([name] MD; 06/16/2018 12:24 PM)  HISTORY OF BREAST CANCER (Z85.3) Impression: port removal Holds xaralto 48 hours prior to procedure We'll discuss with oncology she needs a Lovenox bridge Risks of bleeding, infection, catheter migration, clot migration, cardiovascular risks, anesthesia risks, complications and need for the procedures and surgery discussed  Current Plans Pt Education - [facility] Free Text Education/Instructions: discussed with patient and provided information.

## 2018-06-23 ENCOUNTER — Other Ambulatory Visit: Payer: Self-pay | Admitting: Adult Health

## 2018-07-09 ENCOUNTER — Encounter (HOSPITAL_BASED_OUTPATIENT_CLINIC_OR_DEPARTMENT_OTHER): Payer: Self-pay

## 2018-07-09 ENCOUNTER — Other Ambulatory Visit: Payer: Self-pay

## 2018-07-14 ENCOUNTER — Other Ambulatory Visit: Payer: Self-pay

## 2018-07-14 ENCOUNTER — Other Ambulatory Visit (HOSPITAL_COMMUNITY)
Admission: RE | Admit: 2018-07-14 | Discharge: 2018-07-14 | Disposition: A | Discharge status: Home / Self Care | Payer: Medicare Other | Source: Ambulatory Visit | Attending: Surgery | Admitting: Surgery

## 2018-07-14 ENCOUNTER — Encounter (HOSPITAL_BASED_OUTPATIENT_CLINIC_OR_DEPARTMENT_OTHER)
Admission: RE | Admit: 2018-07-14 | Discharge: 2018-07-14 | Disposition: A | Discharge status: Home / Self Care | Payer: Medicare Other | Source: Ambulatory Visit | Attending: Surgery | Admitting: Surgery

## 2018-07-14 DIAGNOSIS — Z1159 Encounter for screening for other viral diseases: Secondary | ICD-10-CM | POA: Insufficient documentation

## 2018-07-14 DIAGNOSIS — Z01812 Encounter for preprocedural laboratory examination: Secondary | ICD-10-CM | POA: Diagnosis present

## 2018-07-14 LAB — BASIC METABOLIC PANEL
Anion gap: 8 (ref 5–15)
BUN: 22 mg/dL (ref 8–23)
CO2: 28 mmol/L (ref 22–32)
Calcium: 9.8 mg/dL (ref 8.9–10.3)
Chloride: 104 mmol/L (ref 98–111)
Creatinine, Ser: 0.88 mg/dL (ref 0.44–1.00)
GFR calc Af Amer: >60 mL/min (ref >60)
GFR calc non Af Amer: >60 mL/min (ref >60)
Glucose, Bld: 105 mg/dL — ABNORMAL HIGH (ref 70–99)
Potassium: 3.9 mmol/L (ref 3.5–5.1)
Sodium: 140 mmol/L (ref 135–145)

## 2018-07-14 LAB — SARS CORONAVIRUS 2 (TAT 6-24 HRS): SARS Coronavirus 2: NEGATIVE

## 2018-07-14 NOTE — Progress Notes (Signed)
Ensure pre surgery drink given with instructions to complete by [facility], pt verbalized understanding.

## 2018-07-17 ENCOUNTER — Encounter (HOSPITAL_BASED_OUTPATIENT_CLINIC_OR_DEPARTMENT_OTHER)
Admission: RE | Disposition: A | Discharge status: Home / Self Care | Payer: Self-pay | Source: Home / Self Care | Attending: Surgery

## 2018-07-17 ENCOUNTER — Ambulatory Visit (HOSPITAL_BASED_OUTPATIENT_CLINIC_OR_DEPARTMENT_OTHER): Payer: Medicare Other | Admitting: Anesthesiology

## 2018-07-17 ENCOUNTER — Encounter (HOSPITAL_BASED_OUTPATIENT_CLINIC_OR_DEPARTMENT_OTHER): Payer: Self-pay

## 2018-07-17 ENCOUNTER — Other Ambulatory Visit: Payer: Self-pay

## 2018-07-17 ENCOUNTER — Ambulatory Visit (HOSPITAL_BASED_OUTPATIENT_CLINIC_OR_DEPARTMENT_OTHER)
Admission: RE | Admit: 2018-07-17 | Discharge: 2018-07-17 | Disposition: A | Discharge status: Home / Self Care | Payer: Medicare Other | Attending: Surgery | Admitting: Surgery

## 2018-07-17 ENCOUNTER — Other Ambulatory Visit (HOSPITAL_COMMUNITY): Payer: Self-pay | Admitting: Cardiology

## 2018-07-17 DIAGNOSIS — Z452 Encounter for adjustment and management of vascular access device: Secondary | ICD-10-CM | POA: Diagnosis present

## 2018-07-17 DIAGNOSIS — R51 Headache: Secondary | ICD-10-CM | POA: Diagnosis not present

## 2018-07-17 DIAGNOSIS — Z7901 Long term (current) use of anticoagulants: Secondary | ICD-10-CM | POA: Diagnosis not present

## 2018-07-17 DIAGNOSIS — Z6836 Body mass index (BMI) 36.0-36.9, adult: Secondary | ICD-10-CM | POA: Insufficient documentation

## 2018-07-17 DIAGNOSIS — Z87891 Personal history of nicotine dependence: Secondary | ICD-10-CM | POA: Insufficient documentation

## 2018-07-17 DIAGNOSIS — I10 Essential (primary) hypertension: Secondary | ICD-10-CM | POA: Insufficient documentation

## 2018-07-17 DIAGNOSIS — Z79899 Other long term (current) drug therapy: Secondary | ICD-10-CM | POA: Insufficient documentation

## 2018-07-17 DIAGNOSIS — Z885 Allergy status to narcotic agent status: Secondary | ICD-10-CM | POA: Diagnosis not present

## 2018-07-17 DIAGNOSIS — Z853 Personal history of malignant neoplasm of breast: Secondary | ICD-10-CM | POA: Diagnosis not present

## 2018-07-17 DIAGNOSIS — Z9221 Personal history of antineoplastic chemotherapy: Secondary | ICD-10-CM | POA: Diagnosis not present

## 2018-07-17 DIAGNOSIS — Z886 Allergy status to analgesic agent status: Secondary | ICD-10-CM | POA: Diagnosis not present

## 2018-07-17 DIAGNOSIS — I4891 Unspecified atrial fibrillation: Secondary | ICD-10-CM | POA: Insufficient documentation

## 2018-07-17 DIAGNOSIS — E669 Obesity, unspecified: Secondary | ICD-10-CM | POA: Insufficient documentation

## 2018-07-17 DIAGNOSIS — D696 Thrombocytopenia, unspecified: Secondary | ICD-10-CM | POA: Diagnosis not present

## 2018-07-17 HISTORY — PX: PORT-A-CATH REMOVAL: SHX5289

## 2018-07-17 HISTORY — DX: Personal history of other diseases of the digestive system: Z87.19

## 2018-07-17 SURGERY — REMOVAL PORT-A-CATH
Anesthesia: Monitor Anesthesia Care | Site: Chest | Laterality: Right

## 2018-07-17 MED ORDER — PROMETHAZINE HCL 25 MG/ML IJ SOLN: 6.2500 mg | INTRAMUSCULAR | Status: DC | PRN

## 2018-07-17 MED ORDER — CEFAZOLIN SODIUM-DEXTROSE 2-4 GM/100ML-% IV SOLN
INTRAVENOUS | Status: AC
  Filled 2018-07-17: qty 100

## 2018-07-17 MED ORDER — BUPIVACAINE-EPINEPHRINE 0.25% -1:200000 IJ SOLN
INTRAMUSCULAR | Status: DC | PRN
  Administered 2018-07-17: 10 mL

## 2018-07-17 MED ORDER — ONDANSETRON HCL 4 MG/2ML IJ SOLN
INTRAMUSCULAR | Status: AC
  Filled 2018-07-17: qty 2

## 2018-07-17 MED ORDER — LACTATED RINGERS IV SOLN
INTRAVENOUS | Status: DC
  Administered 2018-07-17: 13:00:00 via INTRAVENOUS

## 2018-07-17 MED ORDER — CEFAZOLIN SODIUM-DEXTROSE 2-4 GM/100ML-% IV SOLN
2.0000 g | INTRAVENOUS | Status: AC
  Administered 2018-07-17: 2 g via INTRAVENOUS

## 2018-07-17 MED ORDER — HYDROMORPHONE HCL 1 MG/ML IJ SOLN: 0.2500 mg | INTRAMUSCULAR | Status: DC | PRN

## 2018-07-17 MED ORDER — GABAPENTIN 300 MG PO CAPS: 300.0000 mg | ORAL_CAPSULE | ORAL | Status: DC

## 2018-07-17 MED ORDER — CHLORHEXIDINE GLUCONATE CLOTH 2 % EX PADS: 6.0000 | MEDICATED_PAD | Freq: Once | CUTANEOUS | Status: DC

## 2018-07-17 MED ORDER — PROPOFOL 500 MG/50ML IV EMUL
INTRAVENOUS | Status: DC | PRN
  Administered 2018-07-17: 50 ug/kg/min via INTRAVENOUS

## 2018-07-17 MED ORDER — FENTANYL CITRATE (PF) 100 MCG/2ML IJ SOLN
INTRAMUSCULAR | Status: AC
  Filled 2018-07-17: qty 2

## 2018-07-17 MED ORDER — ONDANSETRON HCL 4 MG/2ML IJ SOLN
INTRAMUSCULAR | Status: DC | PRN
  Administered 2018-07-17: 4 mg via INTRAVENOUS

## 2018-07-17 MED ORDER — FENTANYL CITRATE (PF) 100 MCG/2ML IJ SOLN
INTRAMUSCULAR | Status: DC | PRN
  Administered 2018-07-17 (×2): 50 ug via INTRAVENOUS

## 2018-07-17 MED ORDER — GABAPENTIN 300 MG PO CAPS
ORAL_CAPSULE | ORAL | Status: AC
  Filled 2018-07-17: qty 1

## 2018-07-17 SURGICAL SUPPLY — 34 items
ADH SKN CLS APL DERMABOND .7 (GAUZE/BANDAGES/DRESSINGS) ×1
APL PRP STRL LF DISP 70% ISPRP (MISCELLANEOUS) ×1
APL SKNCLS STERI-STRIP NONHPOA (GAUZE/BANDAGES/DRESSINGS)
BENZOIN TINCTURE PRP APPL 2/3 (GAUZE/BANDAGES/DRESSINGS) IMPLANT
BLADE SURG 15 STRL LF DISP TIS (BLADE) ×1 IMPLANT
BLADE SURG 15 STRL SS (BLADE) ×2
CHLORAPREP W/TINT 26 (MISCELLANEOUS) ×2 IMPLANT
COVER BACK TABLE REUSABLE LG (DRAPES) ×2 IMPLANT
COVER MAYO STAND REUSABLE (DRAPES) ×2 IMPLANT
COVER WAND RF STERILE (DRAPES) IMPLANT
DECANTER SPIKE VIAL GLASS SM (MISCELLANEOUS) ×1 IMPLANT
DERMABOND ADVANCED (GAUZE/BANDAGES/DRESSINGS) ×1
DERMABOND ADVANCED .7 DNX12 (GAUZE/BANDAGES/DRESSINGS) ×1 IMPLANT
DRAPE LAPAROTOMY 100X72 PEDS (DRAPES) ×2 IMPLANT
DRAPE UTILITY XL STRL (DRAPES) ×2 IMPLANT
ELECT REM PT RETURN 9FT ADLT (ELECTROSURGICAL) ×2
ELECTRODE REM PT RTRN 9FT ADLT (ELECTROSURGICAL) ×1 IMPLANT
GLOVE BIOGEL PI IND STRL 8 (GLOVE) ×1 IMPLANT
GLOVE BIOGEL PI INDICATOR 8 (GLOVE) ×2
GLOVE ECLIPSE 8.0 STRL XLNG CF (GLOVE) ×3 IMPLANT
GOWN STRL REUS W/ TWL LRG LVL3 (GOWN DISPOSABLE) ×2 IMPLANT
GOWN STRL REUS W/TWL LRG LVL3 (GOWN DISPOSABLE) ×4
NDL HYPO 25X1 1.5 SAFETY (NEEDLE) ×1 IMPLANT
NEEDLE HYPO 25X1 1.5 SAFETY (NEEDLE) ×2 IMPLANT
NS IRRIG 1000ML POUR BTL (IV SOLUTION) ×2 IMPLANT
PACK BASIN DAY SURGERY FS (CUSTOM PROCEDURE TRAY) ×2 IMPLANT
PENCIL BUTTON HOLSTER BLD 10FT (ELECTRODE) IMPLANT
SLEEVE SCD COMPRESS KNEE MED (MISCELLANEOUS) IMPLANT
SPONGE LAP 4X18 RFD (DISPOSABLE) ×1 IMPLANT
STRIP CLOSURE SKIN 1/2X4 (GAUZE/BANDAGES/DRESSINGS) IMPLANT
SUT MON AB 4-0 PC3 18 (SUTURE) ×2 IMPLANT
SUT VICRYL 3-0 CR8 SH (SUTURE) ×2 IMPLANT
SYR CONTROL 10ML LL (SYRINGE) ×2 IMPLANT
TOWEL GREEN STERILE FF (TOWEL DISPOSABLE) ×2 IMPLANT

## 2018-07-17 NOTE — Discharge Instructions (Signed)
GENERAL SURGERY: POST OP INSTRUCTIONS  ######################################################################  EAT Gradually transition to a high fiber diet with a fiber supplement over the next few weeks after discharge.  Start with a pureed / full liquid diet (see below)  WALK Walk an hour a day.  Control your pain to do that.    CONTROL PAIN Control pain so that you can walk, sleep, tolerate sneezing/coughing, go up/down stairs.  HAVE A BOWEL MOVEMENT DAILY Keep your bowels regular to avoid problems.  OK to try a laxative to override constipation.  OK to use an antidairrheal to slow down diarrhea.  Call if not better after 2 tries  CALL IF YOU HAVE PROBLEMS/CONCERNS Call if you are still struggling despite following these instructions. Call if you have concerns not answered by these instructions  ######################################################################    1. DIET: Follow a light bland diet & liquids the first 24 hours after arrival home, such as soup, liquids, starches, etc.  Be sure to drink plenty of fluids.  Quickly advance to a usual solid diet within a few days.  Avoid fast food or heavy meals as your are more likely to get nauseated or have irregular bowels.  A low-fat, high-fiber diet for the rest of your life is ideal.   2. Take your usually prescribed home medications unless otherwise directed. 3. PAIN CONTROL: a. Pain is best controlled by a usual combination of three different methods TOGETHER: i. Ice/Heat ii. Over the counter pain medication iii. Prescription pain medication b. Most patients will experience some swelling and bruising around the incisions.  Ice packs or heating pads (30-60 minutes up to 6 times a day) will help. Use ice for the first few days to help decrease swelling and bruising, then switch to heat to help relax tight/sore spots and speed recovery.  Some people prefer to use ice alone, heat alone, alternating between ice & heat.   Experiment to what works for you.  Swelling and bruising can take several weeks to resolve.   c. It is helpful to take an over-the-counter pain medication regularly for the first few weeks.  Choose one of the following that works best for you: i. Naproxen (Aleve, etc)  Two 220mg tabs twice a day ii. Ibuprofen (Advil, etc) Three 200mg tabs four times a day (every meal & bedtime) iii. Acetaminophen (Tylenol, etc) 500-650mg four times a day (every meal & bedtime) d. A  prescription for pain medication (such as oxycodone, hydrocodone, etc) should be given to you upon discharge.  Take your pain medication as prescribed.  i. If you are having problems/concerns with the prescription medicine (does not control pain, nausea, vomiting, rash, itching, etc), please call us [phone] to see if we need to switch you to a different pain medicine that will work better for you and/or control your side effect better. ii. If you need a refill on your pain medication, please contact your pharmacy.  They will contact our office to request authorization. Prescriptions will not be filled after 5 pm or on week-ends. 4. Avoid getting constipated.  Between the surgery and the pain medications, it is common to experience some constipation.  Increasing fluid intake and taking a fiber supplement (such as Metamucil, Citrucel, FiberCon, MiraLax, etc) 1-2 times a day regularly will usually help prevent this problem from occurring.  A mild laxative (prune juice, Milk of Magnesia, MiraLax, etc) should be taken according to package directions if there are no bowel movements after 48 hours.   5. Wash /   shower every day.  You may shower over the dressings as they are waterproof.  Continue to shower over incision(s) after the dressing is off. 6. Remove your waterproof bandages 5 days after surgery.  You may leave the incision open to air.  You may have skin tapes (Steri Strips) covering the incision(s).  Leave them on until one week, then  remove.  You may replace a dressing/Band-Aid to cover the incision for comfort if you wish.      7. ACTIVITIES as tolerated:   a. You may resume regular (light) daily activities beginning the next day--such as daily self-care, walking, climbing stairs--gradually increasing activities as tolerated.  If you can walk 30 minutes without difficulty, it is safe to try more intense activity such as jogging, treadmill, bicycling, low-impact aerobics, swimming, etc. b. Save the most intensive and strenuous activity for last such as sit-ups, heavy lifting, contact sports, etc  Refrain from any heavy lifting or straining until you are off narcotics for pain control.   c. DO NOT PUSH THROUGH PAIN.  Let pain be your guide: If it hurts to do something, don't do it.  Pain is your body warning you to avoid that activity for another week until the pain goes down. d. You may drive when you are no longer taking prescription pain medication, you can comfortably wear a seatbelt, and you can safely maneuver your car and apply brakes. e. You may have sexual intercourse when it is comfortable.  8. FOLLOW UP in our office a. Please call [facility] at [phone] to set up an appointment to see your surgeon in the office for a follow-up appointment approximately 2-3 weeks after your surgery. b. Make sure that you call for this appointment the day you arrive home to insure a convenient appointment time. 9. IF YOU HAVE DISABILITY OR FAMILY LEAVE FORMS, BRING THEM TO THE OFFICE FOR PROCESSING.  DO NOT GIVE THEM TO YOUR DOCTOR.   WHEN TO CALL US [phone]: 1. Poor pain control 2. Reactions / problems with new medications (rash/itching, nausea, etc)  3. Fever over 101.5 F (38.5 C) 4. Worsening swelling or bruising 5. Continued bleeding from incision. 6. Increased pain, redness, or drainage from the incision 7. Difficulty breathing / swallowing   The clinic staff is available to answer your questions during regular  business hours (8:30am-5pm).  Please don't hesitate to call and ask to speak to one of our nurses for clinical concerns.   If you have a medical emergency, go to the nearest emergency room or call 911.  A surgeon from [facility] is always on call at the hospitals   [facility] [address], [address], [place], [place]  [address] ? MAIN: [phone] ? TOLL FREE: [phone] ?  FAX [phone] [email or web address]    Post Anesthesia Home Care Instructions  Activity: Get plenty of rest for the remainder of the day. A responsible individual must stay with you for 24 hours following the procedure.  For the next 24 hours, DO NOT: -Drive a car -Operate machinery -Drink alcoholic beverages -Take any medication unless instructed by your physician -Make any legal decisions or sign important papers.  Meals: Start with liquid foods such as gelatin or soup. Progress to regular foods as tolerated. Avoid greasy, spicy, heavy foods. If nausea and/or vomiting occur, drink only clear liquids until the nausea and/or vomiting subsides. Call your physician if vomiting continues.  Special Instructions/Symptoms: Your throat may feel dry or   sore from the anesthesia or the breathing tube placed in your throat during surgery. If this causes discomfort, gargle with warm salt water. The discomfort should disappear within 24 hours.  If you had a scopolamine patch placed behind your ear for the management of post- operative nausea and/or vomiting:  1. The medication in the patch is effective for 72 hours, after which it should be removed.  Wrap patch in a tissue and discard in the trash. Wash hands thoroughly with soap and water. 2. You may remove the patch earlier than 72 hours if you experience unpleasant side effects which may include dry mouth, dizziness or visual disturbances. 3. Avoid touching the patch. Wash your hands with soap and water after  contact with the patch.     

## 2018-07-17 NOTE — Transfer of Care (Signed)
Immediate Anesthesia Transfer of Care Note  Patient: Tara Cox  Procedure(s) Performed: PORT REMOVAL (Right Chest)  Patient Location: PACU  Anesthesia Type:MAC  Level of Consciousness: awake, alert  and oriented  Airway & Oxygen Therapy: Patient Spontanous Breathing and Patient connected to nasal cannula oxygen  Post-op Assessment: Report given to RN and Post -op Vital signs reviewed and stable  Post vital signs: Reviewed and stable  Last Vitals:  Vitals Value Taken Time  BP 123/55 07/17/18 1406  Temp    Pulse 77 07/17/18 1408  Resp 15 07/17/18 1408  SpO2 100 % 07/17/18 1408  Vitals shown include unvalidated device data.  Last Pain:  Vitals:   07/17/18 1212  TempSrc: Oral  PainSc: 0-No pain         Complications: No apparent anesthesia complications

## 2018-07-17 NOTE — Op Note (Signed)

## 2018-07-17 NOTE — Anesthesia Postprocedure Evaluation (Signed)
Anesthesia Post Note  Patient: Taneika Constancia Kinnamon  Procedure(s) Performed: PORT REMOVAL (Right Chest)     Patient location during evaluation: PACU Anesthesia Type: MAC Level of consciousness: awake and alert Pain management: pain level controlled Vital Signs Assessment: post-procedure vital signs reviewed and stable Respiratory status: spontaneous breathing, nonlabored ventilation and respiratory function stable Cardiovascular status: stable and blood pressure returned to baseline Postop Assessment: no apparent nausea or vomiting Anesthetic complications: no    Last Vitals:  Vitals:   07/17/18 1500 07/17/18 1514  BP: (!) 145/67 (!) 165/81  Pulse: 67 69  Resp: 16 16  Temp:  36.5 C  SpO2: 99% 96%    Last Pain:  Vitals:   07/17/18 1514  TempSrc:   PainSc: 0-No pain                 Lynda Rainwater

## 2018-07-17 NOTE — H&P (Signed)
Tara Cox Documented: 06/16/2018 12:09 PM Location: [facility] Patient #: [id number] DOB: [birth year] Married / Language: English / Race: Black or African American Female  History of Present Illness Tara Cox A. [name] MD Patient words: Patient returns after completion of chemotherapy. 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 She's now on blood thinners. She is due to have her Port-A-Cath removed after completion of chemotherapy.  The patient is a 84 year old female.   Allergies  Soma *MUSCULOSKELETAL THERAPY AGENTS* Vomiting. Ultram *ANALGESICS - OPIOID* Vomiting. No Known Drug Allergies [04/17/2017]: (Marked as Inactive) Norco *ANALGESICS - OPIOID* Vomiting. Percocet *ANALGESICS - OPIOID* Itching. Allergies Reconciled  Medication History  Atorvastatin Calcium (20MG  Tablet, Oral) Active. Colchicine (0.6MG  Tablet, Oral) Active. Furosemide (20MG  Tablet, Oral) Active. Gabapentin (300MG  Capsule, Oral) Active. Hydroxychloroquine Sulfate (200MG  Tablet, Oral) Active. Losartan Potassium-HCTZ (100-25MG  Tablet, Oral) Active. Metoprolol Tartrate (25MG  Tablet, Oral) Active. Omeprazole (40MG  Capsule DR, Oral) Active. Potassium Chloride Crys ER (20MEQ Tablet ER, Oral) Active. Warfarin Sodium (5MG  Tablet, Oral) Active. Benzonatate (200MG  Capsule, Oral) Active. Combination Attach Set #1100 Active. Benadryl Allergy (Oral) Specific strength unknown - Active. Tylenol Arthritis Ext Relief (Oral) Specific strength unknown - Active. Feverfew (Oral) Specific strength unknown - Active. Calcium (Oral) Specific strength unknown - Active. Centrum Adults (Oral) Active. Magnesium (Oral) Specific strength unknown - Active. Vitamin D3 (Oral) Specific strength unknown - Active. Vitamin C (Oral) Specific strength unknown - Active. Medications Reconciled    Vitals  06/16/2018 12:09 PM Weight: 237.38 lb Height:  67.5in Body Surface Area: 2.19 m Body Mass Index: 36.63 kg/m  Temp.: 97F(Temporal)  Pulse: 92 (Regular)  BP: 168/92 (Sitting, Left Arm, Standard)      Physical Exam   Head and Neck Head-normocephalic, atraumatic with no lesions or palpable masses.  Chest and Lung Exam Note: Port below right clavicle  Breast Note: Right Breast absent . No Masses    Assessment & Plan  HISTORY OF BREAST CANCER (Z85.3) Impression: port removal Holds xaralto 48 hours prior to procedure We'll discuss with oncology she needs a Lovenox bridge Risks of bleeding, infection, catheter migration, clot migration, cardiovascular risks, anesthesia risks, complications and need for the procedures and surgery discussed  Current Plans Pt Education - [facility] Free Text Education/Instructions: discussed with patient and provided information.

## 2018-07-17 NOTE — Interval H&P Note (Signed)
History and Physical Interval Note:  07/17/2018 1:05 PM  Tara Cox  has presented today for surgery, with the diagnosis of PORT.  The various methods of treatment have been discussed with the patient and family. After consideration of risks, benefits and other options for treatment, the patient has consented to  Procedure(s): PORT REMOVAL (N/A) as a surgical intervention.  The patient's history has been reviewed, patient examined, no change in status, stable for surgery.  I have reviewed the patient's chart and labs.  Questions were answered to the patient's satisfaction.     [place]

## 2018-07-17 NOTE — Anesthesia Preprocedure Evaluation (Signed)
Anesthesia Evaluation  Patient identified by MRN, date of birth, ID band Patient awake    Reviewed: Allergy & Precautions, NPO status , Patient's Chart, lab work & pertinent test results, reviewed documented beta blocker date and time   History of Anesthesia Complications (+) PONV and history of anesthetic complications  Airway Mallampati: II  TM Distance: >3 FB Neck ROM: Full    Dental  (+) Dental Advisory Given, Missing, Partial Lower, Partial Upper   Pulmonary former smoker,    Pulmonary exam normal breath sounds clear to auscultation       Cardiovascular hypertension, Pt. on medications and Pt. on home beta blockers Normal cardiovascular exam+ dysrhythmias Atrial Fibrillation  Rhythm:Regular Rate:Normal  04/16/17: Impressions:  - Normal LV size with mild focal basal septal hypertrophy. EF 60-65%. Strain as above. Normal RV size and systolic function. No significant valvular abnormalities.   Neuro/Psych  Headaches, negative psych ROS   GI/Hepatic Neg liver ROS, GERD  Medicated,  Endo/Other  negative endocrine ROSObesity   Renal/GU negative Renal ROS     Musculoskeletal  (+) Arthritis ,   Abdominal (+) + obese,   Peds  Hematology  (+) Blood dyscrasia (Thrombocytopenia; coumadin), ,   Anesthesia Other Findings Day of surgery medications reviewed with the patient.  Right breast cancer  Reproductive/Obstetrics                             Anesthesia Physical  Anesthesia Plan  ASA: III  Anesthesia Plan: MAC   Post-op Pain Management:    Induction: Intravenous  PONV Risk Score and Plan: 3 and Dexamethasone, Ondansetron and Midazolam  Airway Management Planned: LMA  Additional Equipment:   Intra-op Plan:   Post-operative Plan: Extubation in OR  Informed Consent: I have reviewed the patients History and Physical, chart, labs and discussed the procedure including the risks,  benefits and alternatives for the proposed anesthesia with the patient or authorized representative who has indicated his/her understanding and acceptance.     Dental advisory given  Plan Discussed with: CRNA  Anesthesia Plan Comments:         Anesthesia Quick Evaluation

## 2018-07-18 ENCOUNTER — Encounter (HOSPITAL_BASED_OUTPATIENT_CLINIC_OR_DEPARTMENT_OTHER): Payer: Self-pay | Admitting: Surgery

## 2018-09-11 ENCOUNTER — Other Ambulatory Visit: Payer: Self-pay | Admitting: Rheumatology

## 2018-09-11 NOTE — Progress Notes (Signed)
Office Visit Note  Patient: Tara Cox             Date of Birth: Oct 08, 1934           MRN: [id number]             PCP: [name], MD Referring: [name], MD Visit Date: 09/17/2018 Occupation: @GUAROCC @  Subjective:  Pain in hands and feet  History of Present Illness: Tara Cox is a 84 y.o. female with history of rheumatoid arthritis, osteoarthritis, chondrocalcinosis and degenerative disc disease.  She states she has been having pain and discomfort in multiple joints.  She complains of increased discomfort in her feet.  She states she has burning sensation in her feet at nighttime.  She has been taking Plaquenil only 1 tablet a day.  She could not tolerate twice daily dosage secondary to diarrhea.  She has intermittent plantar fasciitis.  She states she uses diclofenac gel to the bottom of her feet and also to her knee joints.  Her right knee replacement is still causing some problems with mobility.  Activities of Daily Living:  Patient reports morning stiffness for 3 minute.   Patient Reports nocturnal pain.  Difficulty dressing/grooming: Denies Difficulty climbing stairs: Denies Difficulty getting out of chair: Reports Difficulty using hands for taps, buttons, cutlery, and/or writing: Reports  Review of Systems  Constitutional: Negative for fatigue, night sweats, weight gain and weight loss.  HENT: Positive for mouth dryness. Negative for mouth sores, trouble swallowing, trouble swallowing and nose dryness.   Eyes: Negative for pain, redness, visual disturbance and dryness.  Respiratory: Negative for cough, shortness of breath and difficulty breathing.   Cardiovascular: Positive for swelling in legs/feet. Negative for chest pain, palpitations, hypertension and irregular heartbeat.  Gastrointestinal: Negative for blood in stool, constipation and diarrhea.  Endocrine: Negative for excessive thirst and increased urination.  Genitourinary:  Negative for difficulty urinating and vaginal dryness.  Musculoskeletal: Positive for arthralgias, joint pain, joint swelling and morning stiffness. Negative for myalgias, muscle weakness, muscle tenderness and myalgias.  Skin: Negative for color change, rash, hair loss, redness, skin tightness, ulcers and sensitivity to sunlight.  Allergic/Immunologic: Negative for susceptible to infections.  Neurological: Positive for light-headedness. Negative for dizziness, headaches, memory loss, night sweats and weakness.  Hematological: Negative for bruising/bleeding tendency and swollen glands.  Psychiatric/Behavioral: Negative for depressed mood and sleep disturbance. The patient is not nervous/anxious.     PMFS History:  Patient Active Problem List   Diagnosis Date Noted  . Aortic atherosclerosis ([place]) 04/01/2018  . Chronic deep vein thrombosis (DVT) of left upper extremity ([place]) 01/17/2018  . Rheumatoid arthritis of multiple sites with negative rheumatoid factor ([place]) 11/13/2017  . Port-A-Cath in place 07/24/2017  . Breast cancer, stage 2, right ([place]) 05/07/2017  . Malignant neoplasm of central portion of right breast in female, estrogen receptor positive ([place]) 04/02/2017  . Primary insomnia 09/27/2016  . ANA positive 09/27/2016  . Complete tear of right rotator cuff 05/11/2016  . High risk medication use 05/07/2016  . Uterine fibroid 12/08/2015  . History of miscarriage 12/08/2015  . Esophagitis 12/08/2015  . Diaphragmatic hernia 12/08/2015  . Calcium pyrophosphate deposition disease 12/08/2015  . Osteoarthritis of both hands 12/08/2015  . Osteoarthritis of both feet 12/08/2015  . History of total right knee replacement 12/08/2015  . Chondromalacia of both patellae 12/08/2015  . DDD (degenerative disc disease), lumbar 12/08/2015  . Essential hypertension 06/26/2013  . Paroxysmal atrial fibrillation ([place]) 06/26/2013  .  Hyperlipidemia 06/26/2013  . DJD (degenerative joint disease) of knee  01/26/2013  . Hemorrhoids 07/11/2010  . IBS (irritable bowel syndrome) 07/11/2010  . Fatigue/loss of sleep 07/11/2010  . Night sweats 07/11/2010  . Chills 07/11/2010  . Weight gain 07/11/2010  . Inflammatory arthritis 07/11/2010  . Incontinence of urine 07/11/2010  . Thrombosed external hemorrhoids 07/11/2010    Past Medical History:  Diagnosis Date  . A-fib ([place])   . Arthritis   . Breast cancer ([place]) 05/07/2017   Right Breast Cancer  . Cancer ([place])   . 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 DDD (degenerative disc disease), lumbar 12/08/2015   S/P discectomy   . Diaphragmatic hernia 12/08/2015  . Dysrhythmia    A-Fib  . Edema of lower extremity 06/29/09   Lower Venous Exam- normal. No evidence of thrombus or thrombophlebitis.  . Esophagitis 12/08/2015  . Frequent urination at night   . GERD (gastroesophageal reflux disease)   . Hemorrhoids   . History of calcium pyrophosphate deposition disease (CPPD) 12/08/2015  . History of hiatal hernia   . History of miscarriage 12/08/2015   x3  . History of total right knee replacement 12/08/2015  . Hypertension 02/24/09   Echo-EF 62%; Myocardial Perfusion Study-Normal. No signifcant ischemia noted.  . IBS (irritable bowel syndrome)   . Migraine   . Osteoarthritis of both feet 12/08/2015  . Osteoarthritis of both hands 12/08/2015  . Personal history of chemotherapy 2019   Right Breast Cancer  . Personal history of radiation therapy 2019   Right Breast Cancer  . PONV (postoperative nausea and vomiting)   . Uterine fibroid 12/08/2015    Family History  Problem Relation Age of Onset  . Other Mother   . Varicose Veins Mother   . Emphysema Mother   . Heart disease Father        heart attack  . Diabetes Sister   . Other Sister        pacemaker  . Diabetes Sister   . Heart disease Sister    Past Surgical History:  Procedure Laterality Date  . ABDOMINAL HYSTERECTOMY  1974  . APPENDECTOMY    . BACK SURGERY  2001  .  BUNIONECTOMY  1986  . CESAREAN SECTION  1971  . COLONOSCOPY    . COLONOSCOPY WITH PROPOFOL N/A 05/28/2014   Procedure: COLONOSCOPY WITH PROPOFOL;  Surgeon: Carol Ada, MD;  Location: [facility];  Service: Endoscopy;  Laterality: N/A;  . ESOPHAGOGASTRODUODENOSCOPY (EGD) WITH PROPOFOL N/A 05/28/2014   Procedure: ESOPHAGOGASTRODUODENOSCOPY (EGD) WITH PROPOFOL;  Surgeon: Carol Ada, MD;  Location: [facility];  Service: Endoscopy;  Laterality: N/A;  . EXPLORATORY LAPAROTOMY     open procedure  . EYE SURGERY Bilateral    cataract removal  . MASTECTOMY Left 05/07/2017  . PORT-A-CATH REMOVAL Right 07/17/2018   Procedure: PORT REMOVAL;  Surgeon: Erroll Luna, MD;  Location: [facility];  Service: General;  Laterality: Right;  . PORTACATH PLACEMENT Right 05/07/2017   Procedure: INSERTION PORT-A-CATH;  Surgeon: Erroll Luna, MD;  Location: [place] [facility];  Service: General;  Laterality: Right;  . SHOULDER SURGERY Right   . SIMPLE MASTECTOMY WITH AXILLARY SENTINEL NODE BIOPSY Right 05/07/2017   Procedure: RIGHT SIMPLE MASTECTOMY WITH AXILLARY SENTINEL NODE BIOPSY;  Surgeon: Erroll Luna, MD;  Location: [facility];  Service: General;  Laterality: Right;  . TOTAL KNEE ARTHROPLASTY Right 01/26/2013   DR Ronnie Derby  . TOTAL KNEE ARTHROPLASTY Right 01/26/2013   Procedure: TOTAL KNEE ARTHROPLASTY;  Surgeon: Vickey Huger, MD;  Location: [facility];  Service: Orthopedics;  Laterality: Right;  . [place]   Social History   Social History Narrative   [phone] Unable to ask abuse questions husband with her today.   Immunization History  Administered Date(s) Administered  . Influenza, High Dose Seasonal PF 10/07/2015, 10/19/2016, 10/25/2017  . Pneumococcal Conjugate-13 11/24/2013  . Pneumococcal Polysaccharide-23 01/10/2003  . Tdap 03/21/2017, 03/21/2017  . Zoster 08/01/2012  . Zoster Recombinat (Shingrix) 03/21/2017, 03/21/2017     Objective: Vital Signs: BP 120/69 (BP Location:  Left Arm, Patient Position: Sitting, Cuff Size: Normal)   Pulse 75   Ht 5\' 7"  (1.702 m)   Wt 241 lb (109.3 kg)   BMI 37.75 kg/m    Physical Exam Vitals signs and nursing note reviewed.  Constitutional:      Appearance: She is well-developed.  HENT:     Head: Normocephalic and atraumatic.  Eyes:     Conjunctiva/sclera: Conjunctivae normal.  Neck:     Musculoskeletal: Normal range of motion.  Cardiovascular:     Rate and Rhythm: Normal rate and regular rhythm.     Heart sounds: Normal heart sounds.  Pulmonary:     Effort: Pulmonary effort is normal.     Breath sounds: Normal breath sounds.  Abdominal:     General: Bowel sounds are normal.     Palpations: Abdomen is soft.  Lymphadenopathy:     Cervical: No cervical adenopathy.  Skin:    General: Skin is warm and dry.     Capillary Refill: Capillary refill takes less than 2 seconds.  Neurological:     Mental Status: She is alert and oriented to person, place, and time.  Psychiatric:        Behavior: Behavior normal.      Musculoskeletal Exam: C-spine and lumbar spine are limited range of motion.  Shoulder joints elbow joints wrist joints with good range of motion.  She had no synovitis over MCP joints.  PIP DIP and CMC thickening was noted.  Her right total knee replacement is doing well.  She has discomfort range of motion of bilateral knee joints without any warmth swelling or effusion.  She has pedal edema on her bilateral lower extremities.  She has some discomfort range of motion of her left ankle joint.  No MTP synovitis was noted.  Although she has discomfort across her MTPs.  CDAI Exam: CDAI Score: - Patient Global: -; Provider Global: - Swollen: -; Tender: - Joint Exam   No joint exam has been documented for this visit   There is currently no information documented on the homunculus. Go to the Rheumatology activity and complete the homunculus joint exam.  Investigation: No additional findings.  Imaging: Xr  Foot 2 Views Left  Result Date: 09/17/2018 PIP and DIP narrowing was noted.  No MTP joint narrowing was noted.  No erosive changes were noted.  No intertarsal joint space narrowing was noted.  Juxta-articular osteopenia was noted. Impression: These findings are consistent with osteoarthritis and rheumatoid arthritis of the foot.  Xr Foot 2 Views Right  Result Date: 09/17/2018 First MTP pain was noted.  Postsurgical changes were noted.  PIP and DIP narrowing was noted.  No erosive changes were noted.  No intertarsal joint space narrowing was noted.  Dorsal spurring was noted.  Juxta-articular osteopenia was noted. Impression: These findings are consistent with rheumatoid arthritis and osteoarthritis overlap.  Xr Hand 2 View Left  Result Date: 09/17/2018 Severe CMC PIP and DIP narrowing  was noted.  Juxta-articular osteopenia was noted.  No MCP joint narrowing or erosive changes were noted.  No intercarpal radiocarpal joint space narrowing was noted.  Erosive versus chest changes were noted in some of the carpal bones. Impression: These findings are consistent with rheumatoid arthritis of the hand.  Xr Hand 2 View Right  Result Date: 09/17/2018 PIP and DIP joint space narrowing was noted.  CMC narrowing was noted.  No significant MCP or intercarpal joint space narrowing was noted.  No erosive changes were noted in the MCP joints.  Few cystic changes were noted in the carpal bones. Impression: These findings are consistent with rheumatoid arthritis and osteoarthritis overlap.   Recent Labs: Lab Results  Component Value Date   WBC 3.7 (L) 05/13/2018   HGB 12.9 05/13/2018   PLT 165 05/13/2018   NA 140 07/14/2018   K 3.9 07/14/2018   CL 104 07/14/2018   CO2 28 07/14/2018   GLUCOSE 105 (H) 07/14/2018   BUN 22 07/14/2018   CREATININE 0.88 07/14/2018   BILITOT 0.6 05/13/2018   ALKPHOS 89 05/13/2018   AST 25 05/13/2018   ALT 28 05/13/2018   PROT 6.7 05/13/2018   ALBUMIN 3.9 05/13/2018   CALCIUM  9.8 07/14/2018   GFRAA >60 07/14/2018    Speciality Comments: PLQ eye exam: 08/21/18 normal. Dr. Katy Fitch. Follow up in 1 year.  Procedures:  No procedures performed Allergies: Oxycodone, Hydrocodone, Percocet [oxycodone-acetaminophen], Soma [carisoprodol], and Ultram [tramadol hcl]   Assessment / Plan:     Visit Diagnoses: Rheumatoid arthritis of multiple sites with negative rheumatoid factor (HCC)-patient states she continues to have some pain and discomfort in her joints.  I did not see much synovitis today.  She has some edema in her lower extremities and difficult to assess if she has inflammation in her ankles.  As she has been having lot of discomfort in her hands and feet will obtain x-rays today.  As she is been experiencing increased discomfort I discussed with her about increasing Plaquenil to 1 tablet in the morning and half in the evening.  She had problems with diarrhea in the past.  If she tolerates it we will change her prescription.  Calcium pyrophosphate deposition disease - Colchicine 0.6 mg 1 tablet by mouth daily.  She has not had any recent flares of CPPD. High risk medication use - Plaquenil 200 mg 1 tablet daily. Last Plaquenil eye exam normal on 08/21/2018.  Most recent CBC/CMP within normal limits except for borderline low WBC on 05/13/2018 and will monitor every 5 months. - Plan: CBC with Differential/Platelet, COMPLETE METABOLIC PANEL WITH GFR  Primary osteoarthritis of both hands -she complains of pain in her hands and intermittent swelling.  Plan: XR Hand 2 View Right, XR Hand 2 View Left.  No radiographic progression was noted.  X-ray findings were consistent with rheumatoid arthritis and osteoarthritis overlap.  Primary osteoarthritis of both feet -he complains of pain in her feet and ongoing discomfort.  Plan: XR Foot 2 Views Right, XR Foot 2 Views Left no radiographic progression was noted.  X-ray findings were consistent with osteoarthritis and rheumatoid arthritis  overlap.  Plantar fasciitis, bilateral-she states she has recurrent plantar fasciitis.  History of total right knee replacement-doing well although she still have some discomfort.  DDD (degenerative disc disease), lumbar-chronic pain.  Osteopenia of multiple sites - Osteopenia followed by oncologist Dr. Jana Hakim.  Last DEXA on 10/31/2017 showed T score of -1.5 at AP spine.  She is on a calcium  and vitamin D supplement daily.  Due for repeat DEXA in October 2021.  Other medical problems are listed as follows:  Pure hypercholesterolemia  Paroxysmal atrial fibrillation (HCC)  Essential hypertension  Malignant neoplasm of central portion of right breast in female, estrogen receptor positive ([place])  History of DVT (deep vein thrombosis)  Pedal edema  BMI 37.0-37.9, adult -a handout on weight loss program was given.  Orders: Orders Placed This Encounter  Procedures  . XR Hand 2 View Right  . XR Hand 2 View Left  . XR Foot 2 Views Right  . XR Foot 2 Views Left  . CBC with Differential/Platelet  . COMPLETE METABOLIC PANEL WITH GFR   No orders of the defined types were placed in this encounter.   Face-to-face time spent with patient was 30 minutes. Greater than 50% of time was spent in counseling and coordination of care.  Follow-Up Instructions: Return in about 5 months (around 02/17/2019).   Bo Merino, MD  Note - This record has been created using Editor, commissioning.  Chart creation errors have been sought, but may not always  have been located. Such creation errors do not reflect on  the standard of medical care.

## 2018-09-11 NOTE — Telephone Encounter (Signed)
Please schedule patient for a follow up visit. Patient was due July 2020. Thanks!

## 2018-09-11 NOTE — Telephone Encounter (Signed)
Last Visit: 04/14/18 Next Visit: was due July 2020. Message sent to the front to schedule patient. Labs: 05/13/18 WBC 3.7 Neutro Abs 1.6 Glucose 104  Okay to refill per Dr. Estanislado Pandy

## 2018-09-17 ENCOUNTER — Encounter: Payer: Self-pay | Admitting: Rheumatology

## 2018-09-17 ENCOUNTER — Other Ambulatory Visit: Payer: Self-pay

## 2018-09-17 ENCOUNTER — Ambulatory Visit: Payer: Self-pay

## 2018-09-17 ENCOUNTER — Ambulatory Visit (INDEPENDENT_AMBULATORY_CARE_PROVIDER_SITE_OTHER): Payer: Medicare Other | Admitting: Rheumatology

## 2018-09-17 VITALS — BP 120/69 | HR 75 | Ht 67.0 in | Wt 241.0 lb

## 2018-09-17 DIAGNOSIS — M1120 Other chondrocalcinosis, unspecified site: Secondary | ICD-10-CM | POA: Diagnosis not present

## 2018-09-17 DIAGNOSIS — M19071 Primary osteoarthritis, right ankle and foot: Secondary | ICD-10-CM

## 2018-09-17 DIAGNOSIS — M5136 Other intervertebral disc degeneration, lumbar region: Secondary | ICD-10-CM

## 2018-09-17 DIAGNOSIS — Z6837 Body mass index (BMI) 37.0-37.9, adult: Secondary | ICD-10-CM

## 2018-09-17 DIAGNOSIS — M722 Plantar fascial fibromatosis: Secondary | ICD-10-CM

## 2018-09-17 DIAGNOSIS — M0609 Rheumatoid arthritis without rheumatoid factor, multiple sites: Secondary | ICD-10-CM | POA: Diagnosis not present

## 2018-09-17 DIAGNOSIS — M19042 Primary osteoarthritis, left hand: Secondary | ICD-10-CM

## 2018-09-17 DIAGNOSIS — C50111 Malignant neoplasm of central portion of right female breast: Secondary | ICD-10-CM

## 2018-09-17 DIAGNOSIS — Z79899 Other long term (current) drug therapy: Secondary | ICD-10-CM | POA: Diagnosis not present

## 2018-09-17 DIAGNOSIS — Z170 Estrogen receptor positive status [ER+]: Secondary | ICD-10-CM

## 2018-09-17 DIAGNOSIS — I480 Paroxysmal atrial fibrillation: Secondary | ICD-10-CM

## 2018-09-17 DIAGNOSIS — M8589 Other specified disorders of bone density and structure, multiple sites: Secondary | ICD-10-CM

## 2018-09-17 DIAGNOSIS — R600 Localized edema: Secondary | ICD-10-CM

## 2018-09-17 DIAGNOSIS — M19072 Primary osteoarthritis, left ankle and foot: Secondary | ICD-10-CM

## 2018-09-17 DIAGNOSIS — I10 Essential (primary) hypertension: Secondary | ICD-10-CM

## 2018-09-17 DIAGNOSIS — Z86718 Personal history of other venous thrombosis and embolism: Secondary | ICD-10-CM

## 2018-09-17 DIAGNOSIS — Z96651 Presence of right artificial knee joint: Secondary | ICD-10-CM

## 2018-09-17 DIAGNOSIS — M19041 Primary osteoarthritis, right hand: Secondary | ICD-10-CM

## 2018-09-17 DIAGNOSIS — E7800 Pure hypercholesterolemia, unspecified: Secondary | ICD-10-CM

## 2018-09-18 LAB — CBC WITH DIFFERENTIAL/PLATELET
Absolute Monocytes: 546 cells/uL (ref 200–950)
Basophils Absolute: 21 cells/uL (ref 0–200)
Basophils Relative: 0.5 %
Eosinophils Absolute: 59 cells/uL (ref 15–500)
Eosinophils Relative: 1.4 %
HCT: 45.9 % — ABNORMAL HIGH (ref 35.0–45.0)
Hemoglobin: 15 g/dL (ref 11.7–15.5)
Lymphs Abs: 2024 cells/uL (ref 850–3900)
MCH: 26.8 pg — ABNORMAL LOW (ref 27.0–33.0)
MCHC: 32.7 g/dL (ref 32.0–36.0)
MCV: 82 fL (ref 80.0–100.0)
MPV: 11.5 fL (ref 7.5–12.5)
Monocytes Relative: 13 %
Neutro Abs: 1550 cells/uL (ref 1500–7800)
Neutrophils Relative %: 36.9 %
Platelets: 172 10*3/uL (ref 140–400)
RBC: 5.6 10*6/uL — ABNORMAL HIGH (ref 3.80–5.10)
RDW: 13.3 % (ref 11.0–15.0)
Total Lymphocyte: 48.2 %
WBC: 4.2 10*3/uL (ref 3.8–10.8)

## 2018-09-18 LAB — COMPLETE METABOLIC PANEL WITH GFR
AG Ratio: 1.5 (calc) (ref 1.0–2.5)
ALT: 36 U/L — ABNORMAL HIGH (ref 6–29)
AST: 28 U/L (ref 10–35)
Albumin: 4.3 g/dL (ref 3.6–5.1)
Alkaline phosphatase (APISO): 94 U/L (ref 37–153)
BUN/Creatinine Ratio: 19 (calc) (ref 6–22)
BUN: 18 mg/dL (ref 7–25)
CO2: 28 mmol/L (ref 20–32)
Calcium: 10.4 mg/dL (ref 8.6–10.4)
Chloride: 100 mmol/L (ref 98–110)
Creat: 0.96 mg/dL — ABNORMAL HIGH (ref 0.60–0.88)
GFR, Est African American: 64 mL/min/{1.73_m2} (ref >=60)
GFR, Est Non African American: 55 mL/min/{1.73_m2} — ABNORMAL LOW (ref >=60)
Globulin: 2.8 g/dL (calc) (ref 1.9–3.7)
Glucose, Bld: 87 mg/dL (ref 65–99)
Potassium: 4.1 mmol/L (ref 3.5–5.3)
Sodium: 142 mmol/L (ref 135–146)
Total Bilirubin: 0.5 mg/dL (ref 0.2–1.2)
Total Protein: 7.1 g/dL (ref 6.1–8.1)

## 2018-09-18 NOTE — Progress Notes (Signed)
Labs are stable. Please forward labs to her PCP.

## 2018-09-29 ENCOUNTER — Other Ambulatory Visit: Payer: Self-pay | Admitting: Rheumatology

## 2018-09-29 NOTE — Telephone Encounter (Signed)
Last Visit: 09/17/18 Next Visit: 02/18/19 Labs: 09/17/18 Stable  PLQ eye exam: 08/21/18 normal.  Okay to refill per Dr. Estanislado Pandy

## 2018-10-01 ENCOUNTER — Telehealth: Payer: Self-pay | Admitting: Oncology

## 2018-10-01 NOTE — Telephone Encounter (Signed)
Contacted patient verify telephone visit for pre reg °

## 2018-10-01 NOTE — Telephone Encounter (Signed)
Called patient regarding upcoming Webex appointment, left a voicemail. This will be a phone visit due to no communication to set this up as virtual.

## 2018-10-02 ENCOUNTER — Other Ambulatory Visit: Payer: Medicare Other

## 2018-10-02 ENCOUNTER — Inpatient Hospital Stay: Payer: Medicare Other | Attending: Oncology | Admitting: Oncology

## 2018-10-02 DIAGNOSIS — C50911 Malignant neoplasm of unspecified site of right female breast: Secondary | ICD-10-CM | POA: Diagnosis not present

## 2018-10-02 MED ORDER — ANASTROZOLE 1 MG PO TABS: 1.0000 mg | ORAL_TABLET | Freq: Every day | ORAL | 4 refills | Status: DC

## 2018-10-02 NOTE — Progress Notes (Signed)
[facility]  [phone] [phone]    ID: Tara Cox DOB: 29-Nov-1934  MR#: [id number]  LKG#:[id number]  Patient Care Team: [name], MD as PCP - General (Family Medicine) Larey Dresser, MD as PCP - Advanced Heart Failure (Cardiology) Magrinat, Virgie Dad, MD as Consulting Physician (Oncology) Erroll Luna, MD as Consulting Physician (General Surgery) Kyung Rudd, MD as Consulting Physician (Radiation Oncology) Vickey Huger, MD as Consulting Physician (Orthopedic Surgery) Bo Merino, MD as Consulting Physician (Rheumatology) Lorretta Harp, MD as Consulting Physician (Cardiology) OTHER MD:  I connected with Tara Cox on 10/02/18 at 12:00 PM EDT by telephone visit and verified that I am speaking with the correct person using two identifiers.   I discussed the limitations, risks, security and privacy concerns of performing an evaluation and management service by telemedicine and the availability of in-person appointments. I also discussed with the patient that there may be a patient responsible charge related to this service. The patient expressed understanding and agreed to proceed.   Other persons participating in the visit and their role in the encounter:    Patient's location: home  Provider's location: [place]: Triple positive breast cancer (s/p right mastectomy)  CURRENT TREATMENT: Anastrozole, rivaroxaban   INTERVAL HISTORY: Tara Cox returns today for follow-up and treatment of her triple positive breast cancer.   She continues on anastrozole. She thinks she is having sightly more frequent hot flashes. Occasionally she feels irritated. Otherwise she is tolerating it well.  She is on rivaroxaban for A fib. She has had some hemorrhoidal bleeding. Otherwise she is tolerating it well.  Since her last visit, she underwent repeat echocardiogram on 05/30/2018. This  showed an ejection fraction of 60-65%, which is stable since prior in 02/2018.  Tara Cox's last bone density screening on 10/31/2017, showed a T-score of -1.5, which is considered osteopenic.    REVIEW OF SYSTEMS: Tara Cox is exercising by taking regular walks.  She is using some stool softeners and some local pad for the hemorrhoidal issue.  She tells me that she wanted to fill out a questionnaire regarding her experience here but did not receive it.  I have asked her to call Gabriela Eves regarding that.  She is taking appropriate pandemic precautions.  A detailed review of systems today was otherwise stable   HISTORY OF CURRENT ILLNESS: From the original intake note:  Tara Cox had mammography at the breast center October 11, 2011 showing calcifications in the upper right breast.  Six-month follow-up was suggested, but  the patient did not follow-up.  More recently she noticed nipple inversion on the right breast and followed this without reporting it over the last several months. She did undergo bilateral diagnostic mammography with tomography and right breast ultrasonography at [facility] on 03/26/2017 showing: breast density category B. There is a highly suspicious right retroareolar mass  measuring approximately 2.3 x 1.5 x 2.8 cm  with associated calcifications, together the mass and calcifications measure up to 3.3 cm mammographically. There are 2 lymph nodes in the right axilla with borderline thickened cortices, 1 of which measures 1 x 0.9 x 0.6 cm with a cortex thickness of 0.4 mm.  Accordingly on 03/29/2017 she proceeded to biopsy of the right breast and one axillary lymph node.. The pathology from this procedure showed ([id number]): Invasive lobular carcinoma,grade II, E-cadherin negative. The right axillary lymph node was negative for carcinoma. Prognostic indicators significant for: estrogen receptor,  100% positive and progesterone receptor, 100% positive, both with  strong staining intensity. Proliferation marker Ki67 at 25%. HER2 amplified with ratios  HER2/CEP17 signals 2.22 and average HER2 copies per cell 3.00  The patient's subsequent history is as detailed below.   PAST MEDICAL HISTORY: Past Medical History:  Diagnosis Date  . A-fib ([place])   . Arthritis   . Breast cancer ([place]) 05/07/2017   Right Breast Cancer  . Cancer ([place])   . 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 DDD (degenerative disc disease), lumbar 12/08/2015   S/P discectomy   . Diaphragmatic hernia 12/08/2015  . Dysrhythmia    A-Fib  . Edema of lower extremity 06/29/09   Lower Venous Exam- normal. No evidence of thrombus or thrombophlebitis.  . Esophagitis 12/08/2015  . Frequent urination at night   . GERD (gastroesophageal reflux disease)   . Hemorrhoids   . History of calcium pyrophosphate deposition disease (CPPD) 12/08/2015  . History of hiatal hernia   . History of miscarriage 12/08/2015   x3  . History of total right knee replacement 12/08/2015  . Hypertension 02/24/09   Echo-EF 62%; Myocardial Perfusion Study-Normal. No signifcant ischemia noted.  . IBS (irritable bowel syndrome)   . Migraine   . Osteoarthritis of both feet 12/08/2015  . Osteoarthritis of both hands 12/08/2015  . Personal history of chemotherapy 2019   Right Breast Cancer  . Personal history of radiation therapy 2019   Right Breast Cancer  . PONV (postoperative nausea and vomiting)   . Uterine fibroid 12/08/2015    PAST SURGICAL HISTORY: Past Surgical History:  Procedure Laterality Date  . ABDOMINAL HYSTERECTOMY  1974  . APPENDECTOMY    . BACK SURGERY  2001  . BUNIONECTOMY  1986  . CESAREAN SECTION  1971  . COLONOSCOPY    . COLONOSCOPY WITH PROPOFOL N/A 05/28/2014   Procedure: COLONOSCOPY WITH PROPOFOL;  Surgeon: Carol Ada, MD;  Location: [facility];  Service: Endoscopy;  Laterality: N/A;  . ESOPHAGOGASTRODUODENOSCOPY (EGD) WITH PROPOFOL N/A 05/28/2014   Procedure:  ESOPHAGOGASTRODUODENOSCOPY (EGD) WITH PROPOFOL;  Surgeon: Carol Ada, MD;  Location: [facility];  Service: Endoscopy;  Laterality: N/A;  . EXPLORATORY LAPAROTOMY     open procedure  . EYE SURGERY Bilateral    cataract removal  . MASTECTOMY Left 05/07/2017  . PORT-A-CATH REMOVAL Right 07/17/2018   Procedure: PORT REMOVAL;  Surgeon: Erroll Luna, MD;  Location: [facility];  Service: General;  Laterality: Right;  . PORTACATH PLACEMENT Right 05/07/2017   Procedure: INSERTION PORT-A-CATH;  Surgeon: Erroll Luna, MD;  Location: [facility];  Service: General;  Laterality: Right;  . SHOULDER SURGERY Right   . SIMPLE MASTECTOMY WITH AXILLARY SENTINEL NODE BIOPSY Right 05/07/2017   Procedure: RIGHT SIMPLE MASTECTOMY WITH AXILLARY SENTINEL NODE BIOPSY;  Surgeon: Erroll Luna, MD;  Location: [facility];  Service: General;  Laterality: Right;  . TOTAL KNEE ARTHROPLASTY Right 01/26/2013   DR Ronnie Derby  . TOTAL KNEE ARTHROPLASTY Right 01/26/2013   Procedure: TOTAL KNEE ARTHROPLASTY;  Surgeon: Vickey Huger, MD;  Location: [facility];  Service: Orthopedics;  Laterality: Right;  . UTERINE FIBROID SURGERY  1967    FAMILY HISTORY Family History  Problem Relation Age of Onset  . Other Mother   . Varicose Veins Mother   . Emphysema Mother   . Heart disease Father        heart attack  . Diabetes Sister   . Other Sister        pacemaker  . Diabetes Sister   .  Heart disease Sister    The patient's father died in 30 (around age 31) in [place] due to sudden death. The patient's mother died around age 28 due to emphysema. The patient was born in [place], [place]. The patient has 2 brothers and 3 sisters. One sister had a lumpectomy in 1991, but this was not malignant. Another sister died of pancreatic cancer. The patient denies a family history of breast or ovarian cancer.     GYNECOLOGIC HISTORY:  No LMP recorded. Patient has had a hysterectomy. Menarche: 84 years old Age at first live birth: 84 years  old She is GXP1.  Her LMP was at age 58 when she underwent hysterectomy, without salpingo-oophorectomy. She was on Premarin for more than 20 years.  She never used oral contraceptives.   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 Her husband, Broadus John, used to be in banking, and he is currently retired. The patient's biological daughter, Clarene Critchley, is a Health and safety inspector at a contact center in [place]. The patient's adopted daughter, Lenna Sciara, is an Social research officer, government in [place]. The patient's adopted son, Gaspar Bidding, is unemployed in [place]. The patient has 8 grandchildren, one of whom belongs to [place] and works as a Pharmacist, community.  The patient belongs to [employer].    ADVANCED DIRECTIVES:    HEALTH MAINTENANCE: Social History   Tobacco Use  . Smoking status: Former Smoker    Packs/day: 1.00    Years: 20.00    Pack years: 20.00    Types: Cigarettes    Quit date: 02/19/1990    Years since quitting: 28.6  . Smokeless tobacco: Never Used  . Tobacco comment: ' i QUIT SMOKING MANY YEARS AGO "  Substance Use Topics  . Alcohol use: No  . Drug use: Never    Colonoscopy: Dr. Benson Norway 2016  PAP:  Bone density:   Allergies  Allergen Reactions  . Oxycodone Itching, Nausea And Vomiting and Other (See Comments)    Full body weakness   . Hydrocodone Other (See Comments)    Reports they make her feel sick/drowsy Can take in small amounts  . Percocet [Oxycodone-Acetaminophen] Itching    Pt says she can take tylenol   . Soma [Carisoprodol] Nausea And Vomiting  . Ultram [Tramadol Hcl] Nausea And Vomiting    Current Outpatient Medications  Medication Sig Dispense Refill  . acetaminophen (TYLENOL) 650 MG CR tablet Take 650 mg by mouth every 8 (eight) hours as needed for pain.    Marland Kitchen anastrozole (ARIMIDEX) 1 MG tablet Take 1 tablet (1 mg total) by mouth daily. 90 tablet 4  . Ascorbic Acid (VITAMIN C WITH ROSE HIPS)  500 MG tablet Take 500 mg by mouth daily.    Marland Kitchen aspirin-sod bicarb-citric acid (ALKA-SELTZER) 325 MG TBEF tablet Take 325 mg by mouth every 6 (six) hours as needed.    Marland Kitchen atorvastatin (LIPITOR) 20 MG tablet Take 20 mg by mouth at bedtime.     . benzonatate (TESSALON) 200 MG capsule Take 200 mg by mouth 3 (three) times daily as needed for cough.    . Calcium Carbonate-Vit D-Min (CALCIUM 600+D3 PLUS MINERALS) 600-800 MG-UNIT TABS Take 1 tablet by mouth at bedtime.    . Cholecalciferol (VITAMIN D) 2000 units CAPS Take 2,000 Units by mouth daily. Taking Vitamin D 3 2000 units daliy    . colchicine 0.6 MG tablet TAKE 1 TABLET BY MOUTH  DAILY 90 tablet 0  .  Diclofenac Sodium 1 % CREA Apply topically.    . furosemide (LASIX) 20 MG tablet Take 20 mg by mouth daily.      Marland Kitchen gabapentin (NEURONTIN) 300 MG capsule Take 300 mg by mouth 2 (two) times daily.     . hydrochlorothiazide (HYDRODIURIL) 25 MG tablet Take 25 mg by mouth daily.  1  . hydrocortisone (ANUSOL-HC) 2.5 % rectal cream Place 1 application rectally 2 (two) times daily as needed for hemorrhoids or itching.    . hydroxychloroquine (PLAQUENIL) 200 MG tablet TAKE 1 TABLET BY MOUTH  DAILY 90 tablet 0  . lidocaine-prilocaine (EMLA) cream Apply 1 application topically as needed. 30 g 0  . losartan (COZAAR) 100 MG tablet Take 100 mg by mouth daily.  1  . losartan-hydrochlorothiazide (HYZAAR) 100-25 MG tablet     . magnesium oxide (MAG-OX) 400 MG tablet Take 400 mg by mouth daily.     . metoprolol succinate (TOPROL-XL) 50 MG 24 hr tablet TAKE 1 TABLET BY MOUTH  DAILY , TAKE WITH OR  IMMEDIATELY FOLLOWING A  MEAL 90 tablet 3  . Multiple Vitamin (MULTIVITAMIN) tablet Take 1 tablet by mouth daily. CENTRUM    . omeprazole (PRILOSEC) 40 MG capsule Take 40 mg by mouth daily as needed (FOR ACID REFLUX/INDIGESTION).   12  . potassium chloride SA (K-DUR,KLOR-CON) 20 MEQ tablet Take 10 mEq by mouth daily.    . Trastuzumab (HERCEPTIN IV) Inject into the vein every  21 ( twenty-one) days.    Marland Kitchen triamcinolone cream (KENALOG) 0.1 % Apply 1 application topically 2 (two) times daily as needed (Vaginal Itching).    Alveda Reasons 20 MG TABS tablet TAKE 1 TABLET BY MOUTH  DAILY WITH SUPPER 90 tablet 1   No current facility-administered medications for this visit.     OBJECTIVE: Elderly African-American woman in no acute distress  There were no vitals filed for this visit.   There is no height or weight on file to calculate BMI.   Wt Readings from Last 3 Encounters:  09/17/18 241 lb (109.3 kg)  07/17/18 235 lb 10.8 oz (106.9 kg)  04/01/18 236 lb 8 oz (107.3 kg)  ECOG FS:1 - Symptomatic but completely ambulatory  Tele-visit  LAB RESULTS:  CMP     Component Value Date/Time   NA 142 09/17/2018 1352   NA 142 09/22/2015   K 4.1 09/17/2018 1352   CL 100 09/17/2018 1352   CO2 28 09/17/2018 1352   GLUCOSE 87 09/17/2018 1352   BUN 18 09/17/2018 1352   BUN 19 09/22/2015   CREATININE 0.96 (H) 09/17/2018 1352   CALCIUM 10.4 09/17/2018 1352   PROT 7.1 09/17/2018 1352   ALBUMIN 3.9 05/13/2018 1116   AST 28 09/17/2018 1352   ALT 36 (H) 09/17/2018 1352   ALKPHOS 89 05/13/2018 1116   BILITOT 0.5 09/17/2018 1352   GFRNONAA 55 (L) 09/17/2018 1352   GFRAA 64 09/17/2018 1352    No results found for: TOTALPROTELP, ALBUMINELP, A1GS, A2GS, BETS, BETA2SER, GAMS, MSPIKE, SPEI  No results found for: KPAFRELGTCHN, LAMBDASER, KAPLAMBRATIO  Lab Results  Component Value Date   WBC 4.2 09/17/2018   NEUTROABS 1,550 09/17/2018   HGB 15.0 09/17/2018   HCT 45.9 (H) 09/17/2018   MCV 82.0 09/17/2018   PLT 172 09/17/2018    '@LASTCHEMISTRY' @  No results found for: LABCA2  No components found for: ZGYFVC944  No results for input(s): INR in the last 168 hours.  No results found for: LABCA2  No results found  for: ZCH885  No results found for: OYD741  No results found for: OIN867  No results found for: CA2729  No components found for: HGQUANT  No results  found for: CEA1 / No results found for: CEA1   No results found for: AFPTUMOR  No results found for: CHROMOGRNA  No results found for: PSA1  No visits with results within 3 Day(s) from this visit.  Latest known visit with results is:  Office Visit on 09/17/2018  Component Date Value Ref Range Status  . WBC 09/17/2018 4.2  3.8 - 10.8 Thousand/uL Final  . RBC 09/17/2018 5.60* 3.80 - 5.10 Million/uL Final  . Hemoglobin 09/17/2018 15.0  11.7 - 15.5 g/dL Final  . HCT 09/17/2018 45.9* 35.0 - 45.0 % Final  . MCV 09/17/2018 82.0  80.0 - 100.0 fL Final  . MCH 09/17/2018 26.8* 27.0 - 33.0 pg Final  . MCHC 09/17/2018 32.7  32.0 - 36.0 g/dL Final  . RDW 09/17/2018 13.3  11.0 - 15.0 % Final  . Platelets 09/17/2018 172  140 - 400 Thousand/uL Final  . MPV 09/17/2018 11.5  7.5 - 12.5 fL Final  . Neutro Abs 09/17/2018 1,550  1,500 - 7,800 cells/uL Final  . Lymphs Abs 09/17/2018 2,024  850 - 3,900 cells/uL Final  . Absolute Monocytes 09/17/2018 546  200 - 950 cells/uL Final  . Eosinophils Absolute 09/17/2018 59  15 - 500 cells/uL Final  . Basophils Absolute 09/17/2018 21  0 - 200 cells/uL Final  . Neutrophils Relative % 09/17/2018 36.9  % Final  . Total Lymphocyte 09/17/2018 48.2  % Final  . Monocytes Relative 09/17/2018 13.0  % Final  . Eosinophils Relative 09/17/2018 1.4  % Final  . Basophils Relative 09/17/2018 0.5  % Final  . Glucose, Bld 09/17/2018 87  65 - 99 mg/dL Final   Comment: .            Fasting reference interval .   . BUN 09/17/2018 18  7 - 25 mg/dL Final  . Creat 09/17/2018 0.96* 0.60 - 0.88 mg/dL Final   Comment: For patients >63 years of age, the reference limit for Creatinine is approximately 13% higher for people identified as African-American. .   . GFR, Est Non African American 09/17/2018 55* > OR = 60 mL/min/1.75m Final  . GFR, Est African American 09/17/2018 64  > OR = 60 mL/min/1.729mFinal  . BUN/Creatinine Ratio 09/17/2018 19  6 - 22 (calc) Final  . Sodium  09/17/2018 142  135 - 146 mmol/L Final  . Potassium 09/17/2018 4.1  3.5 - 5.3 mmol/L Final  . Chloride 09/17/2018 100  98 - 110 mmol/L Final  . CO2 09/17/2018 28  20 - 32 mmol/L Final  . Calcium 09/17/2018 10.4  8.6 - 10.4 mg/dL Final  . Total Protein 09/17/2018 7.1  6.1 - 8.1 g/dL Final  . Albumin 09/17/2018 4.3  3.6 - 5.1 g/dL Final  . Globulin 09/17/2018 2.8  1.9 - 3.7 g/dL (calc) Final  . AG Ratio 09/17/2018 1.5  1.0 - 2.5 (calc) Final  . Total Bilirubin 09/17/2018 0.5  0.2 - 1.2 mg/dL Final  . Alkaline phosphatase (APISO) 09/17/2018 94  37 - 153 U/L Final  . AST 09/17/2018 28  10 - 35 U/L Final  . ALT 09/17/2018 36* 6 - 29 U/L Final    (this displays the last labs from the last 3 days)  No results found for: TOTALPROTELP, ALBUMINELP, A1GS, A2GS, BETS, BETA2SER, GAMS, MSPIKE, SPEI (this displays SPEP  labs)  No results found for: KPAFRELGTCHN, LAMBDASER, KAPLAMBRATIO (kappa/lambda light chains)  No results found for: HGBA, HGBA2QUANT, HGBFQUANT, HGBSQUAN (Hemoglobinopathy evaluation)   Lab Results  Component Value Date   LDH 162 10/22/2006    No results found for: IRON, TIBC, IRONPCTSAT (Iron and TIBC)  No results found for: FERRITIN  Urinalysis    Component Value Date/Time   COLORURINE YELLOW 09/27/2015 [place] 09/27/2015 1532   LABSPEC 1.015 09/27/2015 1532   PHURINE 6.5 09/27/2015 1532   GLUCOSEU NEGATIVE 09/27/2015 1532   HGBUR TRACE (A) 09/27/2015 1532   BILIRUBINUR NEGATIVE 09/27/2015 1532   KETONESUR NEGATIVE 09/27/2015 1532   PROTEINUR NEGATIVE 09/27/2015 1532   UROBILINOGEN 0.2 01/19/2013 0954   NITRITE NEGATIVE 09/27/2015 1532   LEUKOCYTESUR NEGATIVE 09/27/2015 1532     STUDIES: Xr Foot 2 Views Left  Result Date: 09/17/2018 PIP and DIP narrowing was noted.  No MTP joint narrowing was noted.  No erosive changes were noted.  No intertarsal joint space narrowing was noted.  Juxta-articular osteopenia was noted. Impression: These  findings are consistent with osteoarthritis and rheumatoid arthritis of the foot.  Xr Foot 2 Views Right  Result Date: 09/17/2018 First MTP pain was noted.  Postsurgical changes were noted.  PIP and DIP narrowing was noted.  No erosive changes were noted.  No intertarsal joint space narrowing was noted.  Dorsal spurring was noted.  Juxta-articular osteopenia was noted. Impression: These findings are consistent with rheumatoid arthritis and osteoarthritis overlap.  Xr Hand 2 View Left  Result Date: 09/17/2018 Severe CMC PIP and DIP narrowing was noted.  Juxta-articular osteopenia was noted.  No MCP joint narrowing or erosive changes were noted.  No intercarpal radiocarpal joint space narrowing was noted.  Erosive versus chest changes were noted in some of the carpal bones. Impression: These findings are consistent with rheumatoid arthritis of the hand.  Xr Hand 2 View Right  Result Date: 09/17/2018 PIP and DIP joint space narrowing was noted.  CMC narrowing was noted.  No significant MCP or intercarpal joint space narrowing was noted.  No erosive changes were noted in the MCP joints.  Few cystic changes were noted in the carpal bones. Impression: These findings are consistent with rheumatoid arthritis and osteoarthritis overlap.   ELIGIBLE FOR AVAILABLE RESEARCH PROTOCOL: Exact sciences study   ASSESSMENT: 84 y.o. [place] woman status post central right breast biopsy 03/29/2017 for a clinical T1c N0, stage IA invasive lobular carcinoma, grade 2, strongly estrogen and progesterone receptor positive, also HER-2 amplified, with an MIB-1 of 25%  (1) status post right mastectomy with sentinel lymph node sampling 05/07/2017 for a pT3 pN1, stage IIB invasive lobular carcinoma, grade 3, with negative margins.  (2) adjuvant radiation 07/02/2017 - 08/16/2017 Site/dose:   The patient initially received a dose of 50.4 Gy in 28 fractions to the right chest wall and supraclavicular region. This was  delivered using a 3-D conformal, 4 field technique. The patient then received a boost to the mastectomy scar. This delivered an additional 10 Gy in 5 fractions using an en face electron field. The total dose was 60.4 Gy.  (3) trastuzumab adjuvantly for 1 year started 06/04/2017, to be completed 05/13/2018  (a) echocardiogram 04/16/2017 shows an ejection fraction of 60-65%.  (b) echocardiogram 08/21/2017 shows an ejection fraction in the 60-65% range  (c) echo 11/22/2017 shows an ejection fraction in the 60-65% range  (d) echocardiogram 02/27/2018 showed an ejection fraction in the 60-65% range.  (4) anastrozole  started 05/28/2017  (a) bone density 10/31/2017 shows a T score of -1.5  (5) small seroma right axilla  (6) history of paroxysmal atrial fibrillation, on lifelong anticoagulation   (a) left jugular and subclavian DVT noted on 01/17/2018 while taking Warfarin   (a) started Enoxaparin 16m daily January 2020, switched to rivaroxaban February 2020   PLAN: ELovenia Kim Is tolerating anastrozole with no side effects that she is aware of. The plan is to continue that a minimum of 5 years.  She is tolerating the rivaroxaban with some hemorrhoidal bleeding. She is making sure her BMs are soft and was advised bt Dr TGeorgette Doveron appropriate local therapy.  She is concerned about the cognitive dysfunction.  What she is describing sounds age-appropriate and does not sound like developing Alzheimer's or serious dementia.  The plan is to continue anastrozole for a minimum of 5 years.  She will have her next mammography in March of next year and she will see me again in May.  I commended her excellent exercise program and her carefulness with the current pandemic  She knows to call for any other issues that may develop before the next visit.   Magrinat, GVirgie Dad MD  10/02/18 12:43 PM Medical Oncology and Hematology [facility]AWinona Lake Hackett [id number].  [phone]   Fax. [phone]  I, KWilburn Mylar am acting as scribe for Dr. GVirgie Dad Magrinat.  I, GLurline DelMD, have reviewed the above documentation for accuracy and completeness, and I agree with the above.

## 2018-10-03 ENCOUNTER — Telehealth: Payer: Self-pay | Admitting: Oncology

## 2018-10-03 NOTE — Telephone Encounter (Signed)
I talk with patient regarding schedule  

## 2018-11-22 ENCOUNTER — Other Ambulatory Visit: Payer: Self-pay | Admitting: Adult Health

## 2018-11-22 ENCOUNTER — Other Ambulatory Visit: Payer: Self-pay | Admitting: Rheumatology

## 2018-11-27 ENCOUNTER — Other Ambulatory Visit (HOSPITAL_COMMUNITY): Payer: Self-pay | Admitting: *Deleted

## 2018-11-27 DIAGNOSIS — I272 Pulmonary hypertension, unspecified: Secondary | ICD-10-CM

## 2018-12-01 ENCOUNTER — Other Ambulatory Visit: Payer: Self-pay

## 2018-12-01 ENCOUNTER — Encounter (HOSPITAL_COMMUNITY): Payer: Self-pay | Admitting: Cardiology

## 2018-12-01 ENCOUNTER — Ambulatory Visit (HOSPITAL_COMMUNITY)
Admission: RE | Admit: 2018-12-01 | Discharge: 2018-12-01 | Disposition: A | Discharge status: Home / Self Care | Payer: Medicare Other | Source: Ambulatory Visit | Attending: Cardiology | Admitting: Cardiology

## 2018-12-01 ENCOUNTER — Ambulatory Visit (HOSPITAL_BASED_OUTPATIENT_CLINIC_OR_DEPARTMENT_OTHER)
Admission: RE | Admit: 2018-12-01 | Discharge: 2018-12-01 | Disposition: A | Discharge status: Home / Self Care | Payer: Medicare Other | Source: Ambulatory Visit | Attending: Cardiology | Admitting: Cardiology

## 2018-12-01 VITALS — BP 159/71 | HR 67 | Wt 241.8 lb

## 2018-12-01 DIAGNOSIS — Z87891 Personal history of nicotine dependence: Secondary | ICD-10-CM | POA: Diagnosis not present

## 2018-12-01 DIAGNOSIS — Z825 Family history of asthma and other chronic lower respiratory diseases: Secondary | ICD-10-CM | POA: Insufficient documentation

## 2018-12-01 DIAGNOSIS — G43909 Migraine, unspecified, not intractable, without status migrainosus: Secondary | ICD-10-CM | POA: Insufficient documentation

## 2018-12-01 DIAGNOSIS — Z7901 Long term (current) use of anticoagulants: Secondary | ICD-10-CM | POA: Diagnosis not present

## 2018-12-01 DIAGNOSIS — Z885 Allergy status to narcotic agent status: Secondary | ICD-10-CM | POA: Insufficient documentation

## 2018-12-01 DIAGNOSIS — I480 Paroxysmal atrial fibrillation: Secondary | ICD-10-CM

## 2018-12-01 DIAGNOSIS — K219 Gastro-esophageal reflux disease without esophagitis: Secondary | ICD-10-CM | POA: Insufficient documentation

## 2018-12-01 DIAGNOSIS — Z96651 Presence of right artificial knee joint: Secondary | ICD-10-CM | POA: Diagnosis not present

## 2018-12-01 DIAGNOSIS — Z9011 Acquired absence of right breast and nipple: Secondary | ICD-10-CM | POA: Diagnosis not present

## 2018-12-01 DIAGNOSIS — I10 Essential (primary) hypertension: Secondary | ICD-10-CM | POA: Diagnosis not present

## 2018-12-01 DIAGNOSIS — Z8249 Family history of ischemic heart disease and other diseases of the circulatory system: Secondary | ICD-10-CM | POA: Diagnosis not present

## 2018-12-01 DIAGNOSIS — Z86718 Personal history of other venous thrombosis and embolism: Secondary | ICD-10-CM | POA: Insufficient documentation

## 2018-12-01 DIAGNOSIS — Z923 Personal history of irradiation: Secondary | ICD-10-CM | POA: Insufficient documentation

## 2018-12-01 DIAGNOSIS — Z79811 Long term (current) use of aromatase inhibitors: Secondary | ICD-10-CM | POA: Diagnosis not present

## 2018-12-01 DIAGNOSIS — C50911 Malignant neoplasm of unspecified site of right female breast: Secondary | ICD-10-CM | POA: Diagnosis not present

## 2018-12-01 DIAGNOSIS — Z79899 Other long term (current) drug therapy: Secondary | ICD-10-CM | POA: Diagnosis not present

## 2018-12-01 DIAGNOSIS — I272 Pulmonary hypertension, unspecified: Secondary | ICD-10-CM | POA: Diagnosis not present

## 2018-12-01 DIAGNOSIS — Z833 Family history of diabetes mellitus: Secondary | ICD-10-CM | POA: Insufficient documentation

## 2018-12-01 DIAGNOSIS — Z170 Estrogen receptor positive status [ER+]: Secondary | ICD-10-CM | POA: Diagnosis not present

## 2018-12-01 NOTE — Patient Instructions (Signed)
No medication changes today!  Please check your blood pressure daily and record for 2 weeks. Please send [place] results either by calling us or faxing results to [phone].   Your physician recommends that you schedule a follow-up appointment in: 6 months with Dr Aundra Dubin.  You will get a call to schedule this appointment. If you do not get a call by April, please call our office.  Please call office at [phone] option 2 if you have any questions or concerns.    At the [facility], you and your health needs are our priority. As part of our continuing mission to provide you with exceptional heart care, we have created designated Provider Care Teams. These Care Teams include your primary Cardiologist (physician) and Advanced Practice Providers (APPs- Physician Assistants and Nurse Practitioners) who all work together to provide you with the care you need, when you need it.   You may see any of the following providers on your designated Care Team at your next follow up: Marland Kitchen Dr Glori Bickers . Dr Loralie Champagne . Darrick Grinder, NP . Lyda Jester, PA   Please be sure to bring in all your medications bottles to every appointment.

## 2018-12-01 NOTE — Progress Notes (Signed)
Date:  12/01/2018   ID:  Tara Cox, DOB Sep 13, 1934, MRN [id number]   Provider location: [facility] Type of Visit: Established patient  PCP:  [name], MD  Oncology: Dr. Jana Hakim  Chief Complaint: Migraine headaches   History of Present Illness: Tara Cox is a 84 y.o. female who has a history of pseudogout, paroxysmal atrial fibrillation, osteoarthritis, and breast cancer.  She was referred by Dr. Jana Hakim for cardio-oncology evaluation.    She was diagnosed in 3/19 with right breast cancer, ER+/PR+/HER2+.  She had right mastectomy in 4/19 with Herceptin x 1 year starting afterwards.  She has now completed XRT and Herceptin.    She wore a Zio patch in 8/19 showing a few short runs of SVT, no atrial fibrillation.   In 1/20, she developed a LUE DVT. She was switched from warfarin to Xarelto.   Echo was done today and reviewed, EF 09-38%, grade 2 diastolic dysfunction, normal RV.    Main complaint today is ongoing migraine headaches.  He has had these periodically for years.   No significant exertional dyspnea or chest pain. She has ongoing joint pains from arthritis. No palpitations.   Labs (10/18): creatinine 0.91 Labs (5/20): hgb 12.9, K 3.7, creatinine 0.95 Labs (9/20): K 4.1, creatinine 0.96, hgb 15  ECG (personally reviewed): NSR, inferior TWIs  PMH: 1. CPPD/pseudogout 2. GERD 3. Degenerative disc disease s/p discectomy 4. Right TKR 5. HTN 6. IBS 7. Hyperlipidemia 8. Atrial fibrillation: Paroxysmal, she is on warfarin.  - Zio patch (8/19): shorts runs SVT, no atrial fibrillation.  9. Cardiolite 12/14 was normal. 10. Breast cancer: Diagnosed 3/19 with right breast cancer, ER+/PR+/HER2+.  Right mastectomy in 4/19 with Herceptin x 1 year afterwards.  Completed XRT.  - Echo (4/19): EF 60-65%, mild focal basal septal hypertrophy, GLS -18.1%, normal RV size and systolic function.  - Echo (8/19): EF 60-65%, GLS  -18.2%, grade 2 diastolic dysfunction, normal RV size and systolic function, mild MR.  - Echo (11/19): EF 60-65%, GLS -19.4%, normal RV size/function - Echo (2/20): EF 60-65%, GLS -99.3%, grade 2 diastolic dysfunction, normal RV size and systolic function.  - Echo (5/20): EF 60-65%, GLS -23.2%.  - Echo (11/20): EF 71-69%, grade 2 diastolic dysfunction, RV normal. 11. LUE DVT  Current Outpatient Medications  Medication Sig Dispense Refill  . acetaminophen (TYLENOL) 650 MG CR tablet Take 650 mg by mouth every 8 (eight) hours as needed for pain.    Marland Kitchen anastrozole (ARIMIDEX) 1 MG tablet Take 1 tablet (1 mg total) by mouth daily. 90 tablet 4  . Ascorbic Acid (VITAMIN C WITH ROSE HIPS) 500 MG tablet Take 500 mg by mouth daily.    Marland Kitchen aspirin-sod bicarb-citric acid (ALKA-SELTZER) 325 MG TBEF tablet Take 325 mg by mouth every 6 (six) hours as needed.    Marland Kitchen atorvastatin (LIPITOR) 20 MG tablet Take 20 mg by mouth at bedtime.     . benzonatate (TESSALON) 200 MG capsule Take 200 mg by mouth 3 (three) times daily as needed for cough.    . Calcium Carbonate-Vit D-Min (CALCIUM 600+D3 PLUS MINERALS) 600-800 MG-UNIT TABS Take 1 tablet by mouth at bedtime.    . Cholecalciferol (VITAMIN D) 2000 units CAPS Take 2,000 Units by mouth daily. Taking Vitamin D 3 2000 units daliy    . colchicine 0.6 MG tablet TAKE 1 TABLET BY MOUTH  DAILY 90 tablet 0  . Diclofenac Sodium 1 % CREA Apply topically.    Marland Kitchen  furosemide (LASIX) 20 MG tablet Take 20 mg by mouth daily.      Marland Kitchen gabapentin (NEURONTIN) 300 MG capsule Take 300 mg by mouth 2 (two) times daily.     . hydrocortisone (ANUSOL-HC) 2.5 % rectal cream Place 1 application rectally 2 (two) times daily as needed for hemorrhoids or itching.    . hydroxychloroquine (PLAQUENIL) 200 MG tablet TAKE 1 TABLET BY MOUTH  DAILY 90 tablet 0  . losartan-hydrochlorothiazide (HYZAAR) 100-25 MG tablet     . magnesium oxide (MAG-OX) 400 MG tablet Take 400 mg by mouth daily.     . metoprolol  succinate (TOPROL-XL) 50 MG 24 hr tablet TAKE 1 TABLET BY MOUTH  DAILY , TAKE WITH OR  IMMEDIATELY FOLLOWING A  MEAL 90 tablet 3  . Multiple Vitamin (MULTIVITAMIN) tablet Take 1 tablet by mouth daily. CENTRUM    . omeprazole (PRILOSEC) 40 MG capsule Take 40 mg by mouth daily as needed (FOR ACID REFLUX/INDIGESTION).   12  . potassium chloride SA (K-DUR,KLOR-CON) 20 MEQ tablet Take 10 mEq by mouth daily.    . SUMAtriptan (IMITREX) 50 MG tablet SMARTSIG:1 Tablet(s) By Mouth Every 2 Hours PRN    . topiramate (TOPAMAX) 25 MG tablet Take 25 mg by mouth 2 (two) times daily.    Marland Kitchen triamcinolone cream (KENALOG) 0.1 % Apply 1 application topically 2 (two) times daily as needed (Vaginal Itching).    Alveda Reasons 20 MG TABS tablet TAKE 1 TABLET BY MOUTH  DAILY WITH SUPPER 90 tablet 3   No current facility-administered medications for this encounter.     Allergies:   Oxycodone, Hydrocodone, Percocet [oxycodone-acetaminophen], Soma [carisoprodol], and Ultram [tramadol hcl]   Social History:  The patient  reports that she quit smoking about 28 years ago. Her smoking use included cigarettes. She has a 20.00 pack-year smoking history. She has never used smokeless tobacco. She reports that she does not drink alcohol or use drugs.   Family History:  The patient's family history includes Diabetes in her sister and sister; Emphysema in her mother; Heart disease in her father and sister; Other in her mother and sister; Varicose Veins in her mother.   ROS:  Please see the history of present illness.   All other systems are personally reviewed and negative.   Exam:   BP (!) 159/71   Pulse 67   Wt 109.7 kg (241 lb 12.8 oz)   SpO2 98%   BMI 37.87 kg/m  General: NAD Neck: No JVD, no thyromegaly or thyroid nodule.  Lungs: Clear to auscultation bilaterally with normal respiratory effort. CV: Nondisplaced PMI.  Heart regular S1/S2, no S3/S4, no murmur.  No peripheral edema.  No carotid bruit.  Normal pedal pulses.   Abdomen: Soft, nontender, no hepatosplenomegaly, no distention.  Skin: Intact without lesions or rashes.  Neurologic: Alert and oriented x 3.  Psych: Normal affect. Extremities: No clubbing or cyanosis.  HEENT: Normal.    Recent Labs: 09/17/2018: ALT 36; BUN 18; Creat 0.96; Hemoglobin 15.0; Platelets 172; Potassium 4.1; Sodium 142  Personally reviewed   Wt Readings from Last 3 Encounters:  12/01/18 109.7 kg (241 lb 12.8 oz)  09/17/18 109.3 kg (241 lb)  07/17/18 106.9 kg (235 lb 10.8 oz)      ASSESSMENT AND PLAN:  1. Atrial fibrillation: Paroxysmal.  No palpitations.  Zio patch in 8/19 showed a few short runs SVT.  - Continue Toprol XL.   - Continue Xarelto. Recent CBC was stable.  - Echo was done  today and reviewed, EF remains normal and no significant abnormalities.   2. HTN: BP high today. - She will check BP daily x 2 wks and call me in the results. May need additional antihypertensive medication.  3. LUE DVT: Continue Xarelto.    Recommended follow-up:  6 months  Signed, Loralie Champagne, MD  12/01/2018  [facility] [address] [facility] [place] [address] [phone] (office) [phone] (fax)

## 2018-12-01 NOTE — Progress Notes (Signed)
  Echocardiogram 2D Echocardiogram has been performed.  Burnett Kanaris 12/01/2018, 10:48 AM

## 2018-12-02 ENCOUNTER — Telehealth (HOSPITAL_COMMUNITY): Payer: Self-pay | Admitting: Cardiology

## 2018-12-02 NOTE — Telephone Encounter (Signed)
BP ok 

## 2018-12-02 NOTE — Telephone Encounter (Signed)
Patient called to report b/p reading 135/70

## 2018-12-15 ENCOUNTER — Encounter: Payer: Medicare Other | Admitting: Adult Health

## 2018-12-19 ENCOUNTER — Other Ambulatory Visit: Payer: Self-pay

## 2018-12-19 ENCOUNTER — Ambulatory Visit: Payer: Medicare Other | Admitting: Diagnostic Neuroimaging

## 2018-12-19 DIAGNOSIS — M0609 Rheumatoid arthritis without rheumatoid factor, multiple sites: Secondary | ICD-10-CM

## 2018-12-19 MED ORDER — COLCHICINE 0.6 MG PO TABS: 0.6000 mg | ORAL_TABLET | Freq: Every day | ORAL | 0 refills | Status: DC

## 2018-12-19 MED ORDER — HYDROXYCHLOROQUINE SULFATE 200 MG PO TABS: ORAL_TABLET | ORAL | 0 refills | Status: DC

## 2018-12-19 NOTE — Telephone Encounter (Signed)
Per note on 09/17/2018 "As she is been experiencing increased discomfort I discussed with her about increasing Plaquenil to 1 tablet in the morning and half in the evening."   Spoke with patient and patient states she has thought about increasing the dose several times after her appointment with Dr. Estanislado Pandy and she will increase it tomorrow. I advised patient to contact the office if she cannot tolerate it. Advised patient new prescription would have the new instructions. Patient verbalized understanding.

## 2018-12-19 NOTE — Telephone Encounter (Signed)
Refill request received via fax from [facility] for colchicine and PLQ.   Last Visit: 09/17/2018 Next Visit: 02/18/2019 Labs: 09/17/2018 Labs are stable.  Eye exam: 08/21/2018  Okay to refill per Dr. Estanislado Pandy.

## 2019-01-25 ENCOUNTER — Ambulatory Visit: Payer: Medicare Other | Attending: Internal Medicine

## 2019-01-25 DIAGNOSIS — Z23 Encounter for immunization: Secondary | ICD-10-CM

## 2019-01-25 NOTE — Progress Notes (Signed)
   Covid-19 Vaccination Clinic  Name:  Tara Cox    MRN: [id number] DOB: 02-26-1934  01/25/2019  Ms. Malpass was observed post Covid-19 immunization for 84 MINS without incidence. She was provided with Vaccine Information Sheet and instruction to access the V-Safe system.   Ms. Rolle was instructed to call 911 with any severe reactions post vaccine: Marland Kitchen Difficulty breathing  . Swelling of your face and throat  . A fast heartbeat  . A bad rash all over your body  . Dizziness and weakness

## 2019-02-13 ENCOUNTER — Ambulatory Visit: Payer: Medicare Other | Attending: Internal Medicine

## 2019-02-13 DIAGNOSIS — Z23 Encounter for immunization: Secondary | ICD-10-CM | POA: Insufficient documentation

## 2019-02-13 NOTE — Progress Notes (Signed)
   Covid-19 Vaccination Clinic  Name:  Tara Cox    MRN: [id number] DOB: [birth year]  02/13/2019  Ms. Tara Cox was observed post Covid-19 immunization for 15 minutes without incidence. She was provided with Vaccine Information Sheet and instruction to access the V-Safe system.   Tara Cox was instructed to call 911 with any severe reactions post vaccine: Marland Kitchen Difficulty breathing  . Swelling of your face and throat  . A fast heartbeat  . A bad rash all over your body  . Dizziness and weakness    Immunizations Administered    Name Date Dose VIS Date Route   Pfizer COVID-19 Vaccine 02/13/2019 11:42 AM 0.3 mL 12/19/2018 Intramuscular   Manufacturer: [place]   Lot: [id number]   [place]: [id number]

## 2019-02-16 NOTE — Progress Notes (Signed)
Office Visit Note  Patient: Tara Cox             Date of Birth: Feb 17, 1934           MRN: [id number]             PCP: [name], MD Referring: [name], MD Visit Date: 02/18/2019 Occupation: @GUAROCC @  Subjective:  Pain in both knees   History of Present Illness: Tara Cox is a 84 y.o. female with history of seronegative rheumatoid arthritis, CPPD, and osteoarthritis.  She is taking plaquenil 200 mg 1 tablet in the morning and 1/2 tablet in the evening for management of RA.  She is tolerating PLQ without any side effects.  She takes colchicine 0.6 mg 1 tablet by mouth for management of CPPD.  She denies any recent RA or pseudogout flares.  She reports chronic pain in both knee joints.  She states her right knee replacement is very tender at times. She uses voltaren gel topically as needed for pain relief.    Activities of Daily Living:  Patient reports morning stiffness for 0 minutes.   Patient Reports nocturnal pain.  Difficulty dressing/grooming: Denies Difficulty climbing stairs: Denies Difficulty getting out of chair: Denies Difficulty using hands for taps, buttons, cutlery, and/or writing: Denies  Review of Systems  Constitutional: Negative for fatigue.  HENT: Positive for mouth dryness and nose dryness. Negative for mouth sores.   Eyes: Positive for dryness.  Respiratory: Negative for shortness of breath, wheezing and difficulty breathing.   Cardiovascular: Negative for chest pain and palpitations.  Gastrointestinal: Negative for blood in stool, constipation and diarrhea.  Endocrine: Negative for increased urination.  Genitourinary: Negative for difficulty urinating and painful urination.  Musculoskeletal: Positive for arthralgias, joint pain and joint swelling. Negative for morning stiffness.  Skin: Negative for rash.  Allergic/Immunologic: Negative for susceptible to infections.  Neurological: Negative for dizziness, headaches,  memory loss and weakness.  Hematological: Negative for bruising/bleeding tendency.  Psychiatric/Behavioral: Negative for confusion. The patient is not nervous/anxious.     PMFS History:  Patient Active Problem List   Diagnosis Date Noted  . Aortic atherosclerosis ([place]) 04/01/2018  . Chronic deep vein thrombosis (DVT) of left upper extremity ([place]) 01/17/2018  . Rheumatoid arthritis of multiple sites with negative rheumatoid factor ([place]) 11/13/2017  . Port-A-Cath in place 07/24/2017  . Breast cancer, stage 2, right ([place]) 05/07/2017  . Malignant neoplasm of central portion of right breast in female, estrogen receptor positive ([place]) 04/02/2017  . Primary insomnia 09/27/2016  . ANA positive 09/27/2016  . Complete tear of right rotator cuff 05/11/2016  . High risk medication use 05/07/2016  . Uterine fibroid 12/08/2015  . History of miscarriage 12/08/2015  . Esophagitis 12/08/2015  . Diaphragmatic hernia 12/08/2015  . Calcium pyrophosphate deposition disease 12/08/2015  . Osteoarthritis of both hands 12/08/2015  . Osteoarthritis of both feet 12/08/2015  . History of total right knee replacement 12/08/2015  . Chondromalacia of both patellae 12/08/2015  . DDD (degenerative disc disease), lumbar 12/08/2015  . Essential hypertension 06/26/2013  . Paroxysmal atrial fibrillation ([place]) 06/26/2013  . Hyperlipidemia 06/26/2013  . DJD (degenerative joint disease) of knee 01/26/2013  . Hemorrhoids 07/11/2010  . IBS (irritable bowel syndrome) 07/11/2010  . Fatigue/loss of sleep 07/11/2010  . Night sweats 07/11/2010  . Chills 07/11/2010  . Weight gain 07/11/2010  . Inflammatory arthritis 07/11/2010  . Incontinence of urine 07/11/2010  . Thrombosed external hemorrhoids 07/11/2010    Past Medical History:  Diagnosis Date  . A-fib ([place])   . Arthritis   . Breast cancer ([place]) 05/07/2017   Right Breast Cancer  . Cancer ([place])   . 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 DDD (degenerative disc  disease), lumbar 12/08/2015   S/P discectomy   . Diaphragmatic hernia 12/08/2015  . Dysrhythmia    A-Fib  . Edema of lower extremity 06/29/09   Lower Venous Exam- normal. No evidence of thrombus or thrombophlebitis.  . Esophagitis 12/08/2015  . Frequent urination at night   . GERD (gastroesophageal reflux disease)   . Hemorrhoids   . History of calcium pyrophosphate deposition disease (CPPD) 12/08/2015  . History of hiatal hernia   . History of miscarriage 12/08/2015   x3  . History of total right knee replacement 12/08/2015  . Hypertension 02/24/09   Echo-EF 62%; Myocardial Perfusion Study-Normal. No signifcant ischemia noted.  . IBS (irritable bowel syndrome)   . Migraine   . Osteoarthritis of both feet 12/08/2015  . Osteoarthritis of both hands 12/08/2015  . Personal history of chemotherapy 2019   Right Breast Cancer  . Personal history of radiation therapy 2019   Right Breast Cancer  . PONV (postoperative nausea and vomiting)   . Uterine fibroid 12/08/2015    Family History  Problem Relation Age of Onset  . Other Mother   . Varicose Veins Mother   . Emphysema Mother   . Heart disease Father        heart attack  . Diabetes Sister   . Other Sister        pacemaker  . Diabetes Sister   . Heart disease Sister    Past Surgical History:  Procedure Laterality Date  . ABDOMINAL HYSTERECTOMY  1974  . APPENDECTOMY    . BACK SURGERY  2001  . BUNIONECTOMY  1986  . CESAREAN SECTION  1971  . COLONOSCOPY    . COLONOSCOPY WITH PROPOFOL N/A 05/28/2014   Procedure: COLONOSCOPY WITH PROPOFOL;  Surgeon: Carol Ada, MD;  Location: [facility];  Service: Endoscopy;  Laterality: N/A;  . ESOPHAGOGASTRODUODENOSCOPY (EGD) WITH PROPOFOL N/A 05/28/2014   Procedure: ESOPHAGOGASTRODUODENOSCOPY (EGD) WITH PROPOFOL;  Surgeon: Carol Ada, MD;  Location: [facility];  Service: Endoscopy;  Laterality: N/A;  . EXPLORATORY LAPAROTOMY     open procedure  . EYE SURGERY Bilateral    cataract  removal  . MASTECTOMY Left 05/07/2017  . PORT-A-CATH REMOVAL Right 07/17/2018   Procedure: PORT REMOVAL;  Surgeon: Erroll Luna, MD;  Location: [facility];  Service: General;  Laterality: Right;  . PORTACATH PLACEMENT Right 05/07/2017   Procedure: INSERTION PORT-A-CATH;  Surgeon: Erroll Luna, MD;  Location: [facility];  Service: General;  Laterality: Right;  . SHOULDER SURGERY Right   . SIMPLE MASTECTOMY WITH AXILLARY SENTINEL NODE BIOPSY Right 05/07/2017   Procedure: RIGHT SIMPLE MASTECTOMY WITH AXILLARY SENTINEL NODE BIOPSY;  Surgeon: Erroll Luna, MD;  Location: [facility];  Service: General;  Laterality: Right;  . TOTAL KNEE ARTHROPLASTY Right 01/26/2013   DR Ronnie Derby  . TOTAL KNEE ARTHROPLASTY Right 01/26/2013   Procedure: TOTAL KNEE ARTHROPLASTY;  Surgeon: Vickey Huger, MD;  Location: [facility];  Service: Orthopedics;  Laterality: Right;  . [place]   Social History   Social History Narrative   [phone] Unable to ask abuse questions husband with her today.   Immunization History  Administered Date(s) Administered  . Influenza, High Dose Seasonal PF 10/07/2015, 10/19/2016, 10/25/2017  . PFIZER SARS-COV-2 Vaccination 01/25/2019, 02/13/2019  . Pneumococcal Conjugate-13  11/24/2013  . Pneumococcal Polysaccharide-23 01/10/2003  . Tdap 03/21/2017, 03/21/2017  . Zoster 08/01/2012  . Zoster Recombinat (Shingrix) 03/21/2017, 03/21/2017     Objective: Vital Signs: BP (!) 144/76 (BP Location: Left Arm, Patient Position: Sitting, Cuff Size: Large)   Pulse 69   Resp 15   Ht 5' 7.5" (1.715 m)   Wt 242 lb (109.8 kg)   BMI 37.34 kg/m    Physical Exam Vitals and nursing note reviewed.  Constitutional:      Appearance: She is well-developed.  HENT:     Head: Normocephalic and atraumatic.  Eyes:     Conjunctiva/sclera: Conjunctivae normal.  Pulmonary:     Effort: Pulmonary effort is normal.     Breath sounds: Normal breath sounds.  Abdominal:     General:  Bowel sounds are normal.     Palpations: Abdomen is soft.  Musculoskeletal:     Cervical back: Normal range of motion.  Lymphadenopathy:     Cervical: No cervical adenopathy.  Skin:    General: Skin is warm and dry.     Capillary Refill: Capillary refill takes less than 2 seconds.  Neurological:     Mental Status: She is alert and oriented to person, place, and time.  Psychiatric:        Behavior: Behavior normal.      Musculoskeletal Exam: C-spine, thoracic spine, and lumbar spine good ROM.  Shoulder joints, elbow joints, wrist joints, MCPs, PIPs, and DIPs good ROM with no synovitis.  Complete fist formation bilaterally. Hip joints good ROM with no discomfort.  Knee joints good ROM bilaterally. Right knee replacement mild warmth but no effusion.  Left knee has no warmth or effusion.  Ankle joints good ROM with no discomfort.    CDAI Exam: CDAI Score: -- Patient Global: --; Provider Global: -- Swollen: --; Tender: -- Joint Exam 02/18/2019   No joint exam has been documented for this visit   There is currently no information documented on the homunculus. Go to the Rheumatology activity and complete the homunculus joint exam.  Investigation: No additional findings.  Imaging: No results found.  Recent Labs: Lab Results  Component Value Date   WBC 4.2 09/17/2018   HGB 15.0 09/17/2018   PLT 172 09/17/2018   NA 142 09/17/2018   K 4.1 09/17/2018   CL 100 09/17/2018   CO2 28 09/17/2018   GLUCOSE 87 09/17/2018   BUN 18 09/17/2018   CREATININE 0.96 (H) 09/17/2018   BILITOT 0.5 09/17/2018   ALKPHOS 89 05/13/2018   AST 28 09/17/2018   ALT 36 (H) 09/17/2018   PROT 7.1 09/17/2018   ALBUMIN 3.9 05/13/2018   CALCIUM 10.4 09/17/2018   GFRAA 64 09/17/2018    Speciality Comments: PLQ eye exam: 08/21/18 normal. Dr. Katy Fitch. Follow up in 1 year.  Procedures:  No procedures performed Allergies: Oxycodone, Hydrocodone, Percocet [oxycodone-acetaminophen], Soma [carisoprodol], and  Ultram [tramadol hcl]   Assessment / Plan:     Visit Diagnoses: Rheumatoid arthritis of multiple sites with negative rheumatoid factor ([place]): She has no synovitis on exam.  She has not had any recent rheumatoid arthritis flares.  She is clinically doing well on Plaquenil 200 mg 1 tablet a morning half tablet in the evening.  She has been tolerating Plaquenil without any side effects.  She continues to have chronic pain in both knee joints.  Her right knee joint is replaced and she plans on following up with Dr. Ronnie Derby for further evaluation.  No joint effusion was noted  on exam today.  She has no other joint pain or joint swelling at this time.  She will continue taking Plaquenil as prescribed.  She was advised to notify us if she develops increased joint pain or joint swelling.  She will follow-up in the office in  Calcium pyrophosphate deposition disease - She has not had any recent flares.  She is clinically doing well on Colchicine 0.6 mg 1 tablet daily.    High risk medication use - Plaquenil 200 mg 1 tablet in the morning and half table in the evening. PLQ eye exam: 08/21/18 normal. Dr. Katy Fitch. Follow up in 1 year.  CBC and CMP will be drawn today to monitor for drug toxicity. - Plan: CBC with Differential/Platelet, COMPLETE METABOLIC PANEL WITH GFR  Primary osteoarthritis of both hands: She has no tenderness or synovitis on exam.  She has complete fist formation bilaterally.  Joint protection and muscle strengthening were discussed.  Primary osteoarthritis of both feet: She is not having any discomfort in her feet at this time.  Plantar fasciitis, bilateral: Resolved   History of total right knee replacement: Chronic pain.  She has mild warmth but no effusion on exam.  She is planning on following up with Dr. Ronnie Derby.   DDD (degenerative disc disease), lumbar: She is not having any lower back pain at this time.  No symptoms of radiculopathy.   Osteopenia of multiple sites: DEXA on 10/31/2017:  The BMD measured at [facility] Spine L1-L4 is 1.006 g/cm2 with a T-score of -1.5.  She is taking calcium and vitamin D supplement.  Other medical conditions are listed as follows:  Paroxysmal atrial fibrillation ([place])  Pure hypercholesterolemia  Essential hypertension  Malignant neoplasm of central portion of right breast in female, estrogen receptor positive ([place])  History of DVT (deep vein thrombosis)  Pedal edema  Orders: Orders Placed This Encounter  Procedures  . CBC with Differential/Platelet  . COMPLETE METABOLIC PANEL WITH GFR   No orders of the defined types were placed in this encounter.    Follow-Up Instructions: Return in about 5 months (around 07/18/2019) for Rheumatoid arthritis, Osteoarthritis.   Hazel Sams, PA-C  I examined and evaluated the patient with Hazel Sams PA.  Patient had no active synovitis on my examination.  She has some ongoing discomfort due to underlying osteoarthritis.  She is also concerned about her right total knee replacement.  I have advised her to schedule an appointment with Dr. Lorre Nick.  The plan of care was discussed as noted above.  Bo Merino, MD  Note - This record has been created using Editor, commissioning.  Chart creation errors have been sought, but may not always  have been located. Such creation errors do not reflect on  the standard of medical care.

## 2019-02-18 ENCOUNTER — Other Ambulatory Visit: Payer: Self-pay

## 2019-02-18 ENCOUNTER — Ambulatory Visit: Payer: Medicare Other | Admitting: Rheumatology

## 2019-02-18 ENCOUNTER — Encounter: Payer: Self-pay | Admitting: Rheumatology

## 2019-02-18 VITALS — BP 144/76 | HR 69 | Resp 15 | Ht 67.5 in | Wt 242.0 lb

## 2019-02-18 DIAGNOSIS — M5136 Other intervertebral disc degeneration, lumbar region: Secondary | ICD-10-CM

## 2019-02-18 DIAGNOSIS — M1120 Other chondrocalcinosis, unspecified site: Secondary | ICD-10-CM | POA: Diagnosis not present

## 2019-02-18 DIAGNOSIS — Z79899 Other long term (current) drug therapy: Secondary | ICD-10-CM

## 2019-02-18 DIAGNOSIS — E7800 Pure hypercholesterolemia, unspecified: Secondary | ICD-10-CM

## 2019-02-18 DIAGNOSIS — Z170 Estrogen receptor positive status [ER+]: Secondary | ICD-10-CM

## 2019-02-18 DIAGNOSIS — Z96651 Presence of right artificial knee joint: Secondary | ICD-10-CM

## 2019-02-18 DIAGNOSIS — M722 Plantar fascial fibromatosis: Secondary | ICD-10-CM

## 2019-02-18 DIAGNOSIS — M51369 Other intervertebral disc degeneration, lumbar region without mention of lumbar back pain or lower extremity pain: Secondary | ICD-10-CM

## 2019-02-18 DIAGNOSIS — M19042 Primary osteoarthritis, left hand: Secondary | ICD-10-CM

## 2019-02-18 DIAGNOSIS — M0609 Rheumatoid arthritis without rheumatoid factor, multiple sites: Secondary | ICD-10-CM | POA: Diagnosis not present

## 2019-02-18 DIAGNOSIS — M19072 Primary osteoarthritis, left ankle and foot: Secondary | ICD-10-CM

## 2019-02-18 DIAGNOSIS — I10 Essential (primary) hypertension: Secondary | ICD-10-CM

## 2019-02-18 DIAGNOSIS — M19041 Primary osteoarthritis, right hand: Secondary | ICD-10-CM

## 2019-02-18 DIAGNOSIS — R600 Localized edema: Secondary | ICD-10-CM

## 2019-02-18 DIAGNOSIS — I480 Paroxysmal atrial fibrillation: Secondary | ICD-10-CM

## 2019-02-18 DIAGNOSIS — M19071 Primary osteoarthritis, right ankle and foot: Secondary | ICD-10-CM

## 2019-02-18 DIAGNOSIS — Z86718 Personal history of other venous thrombosis and embolism: Secondary | ICD-10-CM

## 2019-02-18 DIAGNOSIS — C50111 Malignant neoplasm of central portion of right female breast: Secondary | ICD-10-CM

## 2019-02-18 DIAGNOSIS — M8589 Other specified disorders of bone density and structure, multiple sites: Secondary | ICD-10-CM

## 2019-02-19 LAB — COMPLETE METABOLIC PANEL WITH GFR
AG Ratio: 1.8 (calc) (ref 1.0–2.5)
ALT: 46 U/L — ABNORMAL HIGH (ref 6–29)
AST: 36 U/L — ABNORMAL HIGH (ref 10–35)
Albumin: 4.3 g/dL (ref 3.6–5.1)
Alkaline phosphatase (APISO): 103 U/L (ref 37–153)
BUN/Creatinine Ratio: 17 (calc) (ref 6–22)
BUN: 16 mg/dL (ref 7–25)
CO2: 26 mmol/L (ref 20–32)
Calcium: 10 mg/dL (ref 8.6–10.4)
Chloride: 104 mmol/L (ref 98–110)
Creat: 0.93 mg/dL — ABNORMAL HIGH (ref 0.60–0.88)
GFR, Est African American: 66 mL/min/{1.73_m2} (ref >=60)
GFR, Est Non African American: 57 mL/min/{1.73_m2} — ABNORMAL LOW (ref >=60)
Globulin: 2.4 g/dL (calc) (ref 1.9–3.7)
Glucose, Bld: 82 mg/dL (ref 65–99)
Potassium: 3.5 mmol/L (ref 3.5–5.3)
Sodium: 143 mmol/L (ref 135–146)
Total Bilirubin: 0.5 mg/dL (ref 0.2–1.2)
Total Protein: 6.7 g/dL (ref 6.1–8.1)

## 2019-02-19 LAB — CBC WITH DIFFERENTIAL/PLATELET
Absolute Monocytes: 400 cells/uL (ref 200–950)
Basophils Absolute: 30 cells/uL (ref 0–200)
Basophils Relative: 0.7 %
Eosinophils Absolute: 90 cells/uL (ref 15–500)
Eosinophils Relative: 2.1 %
HCT: 43.8 % (ref 35.0–45.0)
Hemoglobin: 14.1 g/dL (ref 11.7–15.5)
Lymphs Abs: 2081 cells/uL (ref 850–3900)
MCH: 26.8 pg — ABNORMAL LOW (ref 27.0–33.0)
MCHC: 32.2 g/dL (ref 32.0–36.0)
MCV: 83.3 fL (ref 80.0–100.0)
MPV: 11.2 fL (ref 7.5–12.5)
Monocytes Relative: 9.3 %
Neutro Abs: 1699 cells/uL (ref 1500–7800)
Neutrophils Relative %: 39.5 %
Platelets: 163 10*3/uL (ref 140–400)
RBC: 5.26 10*6/uL — ABNORMAL HIGH (ref 3.80–5.10)
RDW: 13.1 % (ref 11.0–15.0)
Total Lymphocyte: 48.4 %
WBC: 4.3 10*3/uL (ref 3.8–10.8)

## 2019-02-19 NOTE — Progress Notes (Signed)
CBC stable. Creatinine borderline elevated. LFTs are mildly elevated. Please advise patient to avoid taking NSAIDs, tylenol, and alcohol.   Please forward labs to PCP.

## 2019-03-30 ENCOUNTER — Other Ambulatory Visit: Payer: Self-pay

## 2019-03-30 ENCOUNTER — Ambulatory Visit
Admission: RE | Admit: 2019-03-30 | Discharge: 2019-03-30 | Disposition: A | Discharge status: Home / Self Care | Payer: Medicare Other | Source: Ambulatory Visit | Attending: Oncology | Admitting: Oncology

## 2019-03-30 DIAGNOSIS — C50911 Malignant neoplasm of unspecified site of right female breast: Secondary | ICD-10-CM

## 2019-04-21 ENCOUNTER — Other Ambulatory Visit: Payer: Self-pay | Admitting: *Deleted

## 2019-04-21 MED ORDER — COLCHICINE 0.6 MG PO TABS: 0.6000 mg | ORAL_TABLET | Freq: Every day | ORAL | 0 refills | Status: DC

## 2019-04-21 NOTE — Telephone Encounter (Signed)
Refill request received via fax  Last Visit: 02/18/19 Next Visit: 07/16/19 Labs: 02/18/19 CBC stable. Creatinine borderline elevated. LFTs are mildly elevated  Okay to refill per Dr. Estanislado Pandy

## 2019-05-20 NOTE — Progress Notes (Signed)
Tara Cox  [phone] [phone]    ID: Tara Cox DOB: December 04, 1934  MR#: [id number]  SPQ#:[id number]  Patient Care Team: Tara Sacramento, MD as PCP - General (Family Medicine) Tara Dresser, MD as PCP - Advanced Heart Failure (Cardiology) Tara Cox, Virgie Dad, MD as Consulting Physician (Oncology) Tara Luna, MD as Consulting Physician (General Surgery) Tara Rudd, MD as Consulting Physician (Radiation Oncology) Tara Huger, MD as Consulting Physician (Orthopedic Surgery) Tara Merino, MD as Consulting Physician (Rheumatology) Tara Harp, MD as Consulting Physician (Cardiology) OTHER MD:   CHIEF COMPLAINT: Triple positive breast cancer (s/p right mastectomy)  CURRENT TREATMENT: Anastrozole, rivaroxaban   INTERVAL HISTORY: Tara Cox returns today for follow-up of her triple positive breast cancer.   She continues on anastrozole.  She generally tolerates this well, without unusual hot flashes or vaginal dryness issues.  Since her last visit, she underwent repeat echocardiogram on 12/01/2018. This showed an ejection fraction of 60-65%, which has been stable since 02/2018.  Also since her last visit, she underwent left screening mammography with tomography at [facility] on 03/30/2019 showing: breast density category B; no evidence of malignancy in either breast.   She received both of her Covid-19 vaccine doses-- on 01/25/2019 and 02/13/2019.   REVIEW OF SYSTEMS: Tara Cox has a variety of symptoms she describes as minor and which are also inked constant.  Some days she sleeps a lot, other days she is more active.  [facility]  She had an episode of thrush which she treated with clotrimazole.  She has some remnant hemorrhoidal bleeding.  She got migraines, was started on topiramate and then read that topiramate might cause blindness and stopped the medication.  Interestingly sometimes when she  sleeps on her right side she has a phantom right breast pain.  Aside from these issues a detailed review of systems today was stable   HISTORY OF CURRENT ILLNESS: From the original intake note:  Tara Cox had mammography at the breast center October 11, 2011 showing calcifications in the upper right breast.  Six-month follow-up was suggested, but  the patient did not follow-up.  More recently she noticed nipple inversion on the right breast and followed this without reporting it over the last several months. She did undergo bilateral diagnostic mammography with tomography and right breast ultrasonography at [facility] on 03/26/2017 showing: breast density category B. There is a highly suspicious right retroareolar mass  measuring approximately 2.3 x 1.5 x 2.8 cm  with associated calcifications, together the mass and calcifications measure up to 3.3 cm mammographically. There are 2 lymph nodes in the right axilla with borderline thickened cortices, 1 of which measures 1 x 0.9 x 0.6 cm with a cortex thickness of 0.4 mm.  Accordingly on 03/29/2017 she proceeded to biopsy of the right breast and one axillary lymph node.. The pathology from this procedure showed ([id number]): Invasive lobular carcinoma,grade II, E-cadherin negative. The right axillary lymph node was negative for carcinoma. Prognostic indicators significant for: estrogen receptor, 100% positive and progesterone receptor, 100% positive, both with strong staining intensity. Proliferation marker Ki67 at 25%. HER2 amplified with ratios  HER2/CEP17 signals 2.22 and average HER2 copies per cell 3.00  The patient's subsequent history is as detailed below.   PAST MEDICAL HISTORY: Past Medical History:  Diagnosis Date  . A-fib (Tara Cox)   . Arthritis   . Breast cancer (Tara Cox) 05/07/2017   Right Breast Cancer  .  Cancer (Tara Cox)   . 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 DDD (degenerative disc disease), lumbar 12/08/2015   S/P  discectomy   . Diaphragmatic hernia 12/08/2015  . Dysrhythmia    A-Fib  . Edema of lower extremity 06/29/09   Lower Venous Exam- normal. No evidence of thrombus or thrombophlebitis.  . Esophagitis 12/08/2015  . Frequent urination at night   . GERD (gastroesophageal reflux disease)   . Hemorrhoids   . History of calcium pyrophosphate deposition disease (CPPD) 12/08/2015  . History of hiatal hernia   . History of miscarriage 12/08/2015   x3  . History of total right knee replacement 12/08/2015  . Hypertension 02/24/09   Echo-EF 62%; Myocardial Perfusion Study-Normal. No signifcant ischemia noted.  . IBS (irritable bowel syndrome)   . Migraine   . Osteoarthritis of both feet 12/08/2015  . Osteoarthritis of both hands 12/08/2015  . Personal history of chemotherapy 2019   Right Breast Cancer  . Personal history of radiation therapy 2019   Right Breast Cancer  . PONV (postoperative nausea and vomiting)   . Uterine fibroid 12/08/2015    PAST SURGICAL HISTORY: Past Surgical History:  Procedure Laterality Date  . ABDOMINAL HYSTERECTOMY  1974  . APPENDECTOMY    . BACK SURGERY  2001  . BUNIONECTOMY  1986  . CESAREAN SECTION  1971  . COLONOSCOPY    . COLONOSCOPY WITH PROPOFOL N/A 05/28/2014   Procedure: COLONOSCOPY WITH PROPOFOL;  Surgeon: Carol Ada, MD;  Location: [facility];  Service: Cox;  Laterality: N/A;  . ESOPHAGOGASTRODUODENOSCOPY (EGD) WITH PROPOFOL N/A 05/28/2014   Procedure: ESOPHAGOGASTRODUODENOSCOPY (EGD) WITH PROPOFOL;  Surgeon: Carol Ada, MD;  Location: [facility];  Service: Cox;  Laterality: N/A;  . EXPLORATORY LAPAROTOMY     open procedure  . EYE SURGERY Bilateral    cataract removal  . MASTECTOMY Left 05/07/2017  . PORT-A-CATH REMOVAL Right 07/17/2018   Procedure: PORT REMOVAL;  Surgeon: Tara Luna, MD;  Location: [facility];  Service: General;  Laterality: Right;  . PORTACATH PLACEMENT Right 05/07/2017   Procedure: INSERTION  PORT-A-CATH;  Surgeon: Tara Luna, MD;  Location: [facility];  Service: General;  Laterality: Right;  . SHOULDER SURGERY Right   . SIMPLE MASTECTOMY WITH AXILLARY SENTINEL NODE BIOPSY Right 05/07/2017   Procedure: RIGHT SIMPLE MASTECTOMY WITH AXILLARY SENTINEL NODE BIOPSY;  Surgeon: Tara Luna, MD;  Location: [facility];  Service: General;  Laterality: Right;  . TOTAL KNEE ARTHROPLASTY Right 01/26/2013   DR Ronnie Derby  . TOTAL KNEE ARTHROPLASTY Right 01/26/2013   Procedure: TOTAL KNEE ARTHROPLASTY;  Surgeon: Tara Huger, MD;  Location: [facility];  Service: Orthopedics;  Laterality: Right;  . UTERINE FIBROID SURGERY  1967    FAMILY HISTORY Family History  Problem Relation Age of Onset  . Other Mother   . Varicose Veins Mother   . Emphysema Mother   . Heart disease Father        heart attack  . Diabetes Sister   . Other Sister        pacemaker  . Diabetes Sister   . Heart disease Sister    The patient's father died in 61 (around age 5) in [place] due to sudden death. The patient's mother died around age 53 due to emphysema. The patient was born in [place], [place]. The patient has 2 brothers and 3 sisters. One sister had a lumpectomy in 1991, but this was not malignant. Another sister died of pancreatic cancer. The patient denies a family history of  breast or ovarian cancer.     GYNECOLOGIC HISTORY:  No LMP recorded. Patient has had a hysterectomy. Menarche: 85 years old Age at first live birth: 85 years old She is GXP1.  Her LMP was at age 8 when she underwent hysterectomy, without salpingo-oophorectomy. She was on Premarin for more than 20 years.  She never used oral contraceptives.   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 Her husband, Tara Cox, used to be in banking, and he is currently retired. The patient's biological daughter, Tara Cox, is a Health and safety inspector at a contact center in [place]. The patient's adopted daughter, Tara Cox, is an  Social research officer, government in [place]. The patient's adopted son, Tara Cox, is unemployed in [place]. The patient has 8 grandchildren, one of whom belongs to [place] and works as a Pharmacist, community.  The patient belongs to [place].    ADVANCED DIRECTIVES:    HEALTH MAINTENANCE: Social History   Tobacco Use  . Smoking status: Former Smoker    Packs/day: 1.00    Years: 20.00    Pack years: 20.00    Types: Cigarettes    Quit date: 02/19/1990    Years since quitting: 29.2  . Smokeless tobacco: Never Used  . Tobacco comment: ' i QUIT SMOKING MANY YEARS AGO "  Substance Use Topics  . Alcohol use: No  . Drug use: Never    Colonoscopy: Dr. Benson Norway 2016  PAP:  Bone density:   Allergies  Allergen Reactions  . Oxycodone Itching, Nausea And Vomiting and Other (See Comments)    Full body weakness   . Hydrocodone Other (See Comments)    Reports they make her feel sick/drowsy Can take in small amounts  . Percocet [Oxycodone-Acetaminophen] Itching    Pt says she can take tylenol   . Soma [Carisoprodol] Nausea And Vomiting  . Ultram [Tramadol Hcl] Nausea And Vomiting    Current Outpatient Medications  Medication Sig Dispense Refill  . acetaminophen (TYLENOL) 650 MG CR tablet Take 650 mg by mouth every 8 (eight) hours as needed for pain.    Marland Kitchen anastrozole (ARIMIDEX) 1 MG tablet Take 1 tablet (1 mg total) by mouth daily. 90 tablet 4  . Ascorbic Acid (VITAMIN C WITH ROSE HIPS) 500 MG tablet Take 500 mg by mouth daily.    Marland Kitchen aspirin-sod bicarb-citric acid (ALKA-SELTZER) 325 MG TBEF tablet Take 325 mg by mouth every 6 (six) hours as needed.    Marland Kitchen atorvastatin (LIPITOR) 20 MG tablet Take 20 mg by mouth at bedtime.     . benzonatate (TESSALON) 200 MG capsule Take 200 mg by mouth 3 (three) times daily as needed for cough.    . Calcium Carbonate-Vit D-Min (CALCIUM 600+D3 PLUS MINERALS) 600-800 MG-UNIT TABS Take 1 tablet by mouth at bedtime.    . colchicine 0.6 MG tablet  Take 1 tablet (0.6 mg total) by mouth daily. 90 tablet 0  . Diclofenac Sodium 1 % CREA Apply topically.    . furosemide (LASIX) 20 MG tablet Take 20 mg by mouth daily.      Marland Kitchen gabapentin (NEURONTIN) 300 MG capsule Take 300 mg by mouth 2 (two) times daily.     . hydrocortisone (ANUSOL-HC) 2.5 % rectal cream Place 1 application rectally 2 (two) times daily as needed for hemorrhoids or itching.    . hydroxychloroquine (PLAQUENIL) 200 MG tablet Take 1 tablet by mouth daily in the morning and 1/2 tablet in the evening. [place]  tablet 0  . losartan-hydrochlorothiazide (HYZAAR) 100-25 MG tablet     . magnesium oxide (MAG-OX) 400 MG tablet Take 400 mg by mouth daily.     . metoprolol succinate (TOPROL-XL) 50 MG 24 hr tablet TAKE 1 TABLET BY MOUTH  DAILY , TAKE WITH OR  IMMEDIATELY FOLLOWING A  MEAL 90 tablet 3  . Multiple Vitamin (MULTIVITAMIN) tablet Take 1 tablet by mouth daily. CENTRUM    . omeprazole (PRILOSEC) 40 MG capsule Take 40 mg by mouth daily as needed (FOR ACID REFLUX/INDIGESTION).   12  . potassium chloride SA (K-DUR,KLOR-CON) 20 MEQ tablet Take 10 mEq by mouth daily.    . SUMAtriptan (IMITREX) 50 MG tablet SMARTSIG:1 Tablet(s) By Mouth Every 2 Hours PRN    . topiramate (TOPAMAX) 25 MG tablet Take 25 mg by mouth 2 (two) times daily.    Marland Kitchen triamcinolone cream (KENALOG) 0.1 % Apply 1 application topically 2 (two) times daily as needed (Vaginal Itching).    Tara Cox 20 MG TABS tablet TAKE 1 TABLET BY MOUTH  DAILY WITH SUPPER 90 tablet 3   No current facility-administered medications for this visit.    OBJECTIVE: African-American woman in no acute distress  Vitals:   05/21/19 1332  BP: (!) 158/65  Pulse: 79  Resp: 18  Temp: 97.8 F (36.6 C)  SpO2: 98%     Body mass index is 37 kg/m.   Wt Readings from Last 3 Encounters:  05/21/19 239 lb 12.8 oz (108.8 kg)  02/18/19 242 lb (109.8 kg)  12/01/18 241 lb 12.8 oz (109.7 kg)  ECOG FS:1 - Symptomatic but completely ambulatory  Sclerae  unicteric, EOMs intact Wearing a mask No cervical or supraclavicular adenopathy Lungs no rales or rhonchi Heart regular rate and rhythm Abd soft, obese, nontender, positive bowel sounds MSK no focal spinal tenderness, no upper extremity lymphedema Neuro: nonfocal, well oriented, appropriate affect Breasts: The right breast is status post mastectomy.  There is no evidence of local recurrence.  The left breast is benign.  Both axillae are benign.     LAB RESULTS:  CMP     Component Value Date/Time   NA 140 05/21/2019 1229   NA 142 09/22/2015 0000   K 4.2 05/21/2019 1229   CL 101 05/21/2019 1229   CO2 28 05/21/2019 1229   GLUCOSE 106 (H) 05/21/2019 1229   BUN 19 05/21/2019 1229   BUN 19 09/22/2015 0000   CREATININE 0.90 05/21/2019 1229   CREATININE 0.93 (H) 02/18/2019 1108   CALCIUM 9.8 05/21/2019 1229   PROT 7.3 05/21/2019 1229   ALBUMIN 4.1 05/21/2019 1229   AST 35 05/21/2019 1229   ALT 49 (H) 05/21/2019 1229   ALKPHOS 121 05/21/2019 1229   BILITOT 0.6 05/21/2019 1229   GFRNONAA 60 (L) 05/21/2019 1229   GFRNONAA 57 (L) 02/18/2019 1108   GFRAA >60 05/21/2019 1229   GFRAA 66 02/18/2019 1108    No results found for: TOTALPROTELP, ALBUMINELP, A1GS, A2GS, BETS, BETA2SER, GAMS, MSPIKE, SPEI  No results found for: KPAFRELGTCHN, LAMBDASER, KAPLAMBRATIO  Lab Results  Component Value Date   WBC 4.7 05/21/2019   NEUTROABS 1.9 05/21/2019   HGB 14.1 05/21/2019   HCT 44.1 05/21/2019   MCV 83.4 05/21/2019   PLT 146 (L) 05/21/2019   No results found for: LABCA2  No components found for: [id number]  No results for input(s): INR in the last 168 hours.  No results found for: LABCA2  No results found for: WNU272  No results found  for: JIR678  No results found for: LFY101  No results found for: CA2729  No components found for: HGQUANT  No results found for: CEA1 / No results found for: CEA1   No results found for: AFPTUMOR  No results found for: CHROMOGRNA  No  results found for: HGBA, HGBA2QUANT, HGBFQUANT, HGBSQUAN (Hemoglobinopathy evaluation)   Lab Results  Component Value Date   LDH 162 10/22/2006    No results found for: IRON, TIBC, IRONPCTSAT (Iron and TIBC)  No results found for: FERRITIN  Urinalysis    Component Value Date/Time   COLORURINE YELLOW 09/27/2015 [place] 09/27/2015 1532   LABSPEC 1.015 09/27/2015 1532   PHURINE 6.5 09/27/2015 1532   GLUCOSEU NEGATIVE 09/27/2015 1532   HGBUR TRACE (A) 09/27/2015 1532   BILIRUBINUR NEGATIVE 09/27/2015 1532   KETONESUR NEGATIVE 09/27/2015 1532   PROTEINUR NEGATIVE 09/27/2015 1532   UROBILINOGEN 0.2 01/19/2013 0954   NITRITE NEGATIVE 09/27/2015 1532   LEUKOCYTESUR NEGATIVE 09/27/2015 1532    STUDIES: No results found.   ELIGIBLE FOR AVAILABLE RESEARCH PROTOCOL: Exact sciences study   ASSESSMENT: 85 y.o. [place] woman status post central right breast biopsy 03/29/2017 for a clinical T1c N0, stage IA invasive lobular carcinoma, grade 2, strongly estrogen and progesterone receptor positive, also HER-2 amplified, with an MIB-1 of 25%  (1) status post right mastectomy with sentinel lymph node sampling 05/07/2017 for a pT3 pN1, stage IIB invasive lobular carcinoma, grade 3, with negative margins.  (2) adjuvant radiation 07/02/2017 - 08/16/2017 Site/dose:   The patient initially received a dose of 50.4 Gy in 28 fractions to the right chest wall and supraclavicular region. This was delivered using a 3-D conformal, 4 field technique. The patient then received a boost to the mastectomy scar. This delivered an additional 10 Gy in 5 fractions using an en face electron field. The total dose was 60.4 Gy.  (3) trastuzumab adjuvantly for 1 year started 06/04/2017, to be completed 05/13/2018  (a) echocardiogram 04/16/2017 shows an ejection fraction of 60-65%.  (b) echocardiogram 08/21/2017 shows an ejection fraction in the 60-65% range  (c) echo 11/22/2017 shows an  ejection fraction in the 60-65% range  (d) echocardiogram 02/27/2018 showed an ejection fraction in the 60-65% range.  (4) anastrozole started 05/28/2017  (a) bone density 10/31/2017 shows a T score of -1.5  (5) small seroma right axilla  (6) history of paroxysmal atrial fibrillation, on lifelong anticoagulation   (a) left jugular and subclavian DVT noted on 01/17/2018 while taking Warfarin   (a) started Enoxaparin '150mg'$  daily January 2020, switched to rivaroxaban February 2020   PLAN: Laina is now a little more than 2 years out from definitive surgery for her breast cancer, with no evidence of disease recurrence.  This is favorable.  She is tolerating anastrozole well and the plan will be to continue that a minimum of 5 years.  She will have her next mammography in March and see me again in April 2022.  She knows to call for any other issue that may develop before the next visit  Total encounter time 25 minutes.*   Tara Cox, Virgie Dad, MD  05/21/19 6:01 PM Medical Oncology and Hematology [facility] [place], [place] [address] Tel. [phone]    Fax. [phone]   I, Wilburn Mylar, am acting as scribe for Dr. Virgie Dad. [facility].  I, Lurline Del MD, have reviewed the above documentation for accuracy and completeness, and I agree with the above.   *  Total Encounter Time as defined by the Centers for Medicare and Medicaid Services includes, in addition to the face-to-face time of a patient visit (documented in the note above) non-face-to-face time: obtaining and reviewing outside history, ordering and reviewing medications, tests or procedures, care coordination (communications with other health care professionals or caregivers) and documentation in the medical record.

## 2019-05-21 ENCOUNTER — Inpatient Hospital Stay: Payer: Medicare Other | Attending: Oncology

## 2019-05-21 ENCOUNTER — Other Ambulatory Visit: Payer: Self-pay

## 2019-05-21 ENCOUNTER — Inpatient Hospital Stay: Payer: Medicare Other | Admitting: Oncology

## 2019-05-21 VITALS — BP 158/65 | HR 79 | Temp 97.8°F | Resp 18 | Ht 67.5 in | Wt 239.8 lb

## 2019-05-21 DIAGNOSIS — I480 Paroxysmal atrial fibrillation: Secondary | ICD-10-CM | POA: Insufficient documentation

## 2019-05-21 DIAGNOSIS — Z923 Personal history of irradiation: Secondary | ICD-10-CM | POA: Diagnosis not present

## 2019-05-21 DIAGNOSIS — I82C12 Acute embolism and thrombosis of left internal jugular vein: Secondary | ICD-10-CM | POA: Diagnosis not present

## 2019-05-21 DIAGNOSIS — Z9011 Acquired absence of right breast and nipple: Secondary | ICD-10-CM | POA: Diagnosis not present

## 2019-05-21 DIAGNOSIS — C50111 Malignant neoplasm of central portion of right female breast: Secondary | ICD-10-CM | POA: Diagnosis not present

## 2019-05-21 DIAGNOSIS — Z170 Estrogen receptor positive status [ER+]: Secondary | ICD-10-CM | POA: Diagnosis not present

## 2019-05-21 DIAGNOSIS — Z9221 Personal history of antineoplastic chemotherapy: Secondary | ICD-10-CM | POA: Insufficient documentation

## 2019-05-21 DIAGNOSIS — I82B11 Acute embolism and thrombosis of right subclavian vein: Secondary | ICD-10-CM | POA: Diagnosis not present

## 2019-05-21 DIAGNOSIS — C50911 Malignant neoplasm of unspecified site of right female breast: Secondary | ICD-10-CM | POA: Diagnosis not present

## 2019-05-21 DIAGNOSIS — Z7901 Long term (current) use of anticoagulants: Secondary | ICD-10-CM | POA: Diagnosis not present

## 2019-05-21 DIAGNOSIS — Z79811 Long term (current) use of aromatase inhibitors: Secondary | ICD-10-CM | POA: Insufficient documentation

## 2019-05-21 LAB — CBC WITH DIFFERENTIAL/PLATELET
Abs Immature Granulocytes: 0.01 10*3/uL (ref 0.00–0.07)
Basophils Absolute: 0 10*3/uL (ref 0.0–0.1)
Basophils Relative: 0 %
Eosinophils Absolute: 0 10*3/uL (ref 0.0–0.5)
Eosinophils Relative: 1 %
HCT: 44.1 % (ref 36.0–46.0)
Hemoglobin: 14.1 g/dL (ref 12.0–15.0)
Immature Granulocytes: 0 %
Lymphocytes Relative: 52 %
Lymphs Abs: 2.4 10*3/uL (ref 0.7–4.0)
MCH: 26.7 pg (ref 26.0–34.0)
MCHC: 32 g/dL (ref 30.0–36.0)
MCV: 83.4 fL (ref 80.0–100.0)
Monocytes Absolute: 0.4 10*3/uL (ref 0.1–1.0)
Monocytes Relative: 7 %
Neutro Abs: 1.9 10*3/uL (ref 1.7–7.7)
Neutrophils Relative %: 40 %
Platelets: 146 10*3/uL — ABNORMAL LOW (ref 150–400)
RBC: 5.29 MIL/uL — ABNORMAL HIGH (ref 3.87–5.11)
RDW: 14.1 % (ref 11.5–15.5)
WBC: 4.7 10*3/uL (ref 4.0–10.5)
nRBC: 0 % (ref 0.0–0.2)

## 2019-05-21 LAB — COMPREHENSIVE METABOLIC PANEL
ALT: 49 U/L — ABNORMAL HIGH (ref 0–44)
AST: 35 U/L (ref 15–41)
Albumin: 4.1 g/dL (ref 3.5–5.0)
Alkaline Phosphatase: 121 U/L (ref 38–126)
Anion gap: 11 (ref 5–15)
BUN: 19 mg/dL (ref 8–23)
CO2: 28 mmol/L (ref 22–32)
Calcium: 9.8 mg/dL (ref 8.9–10.3)
Chloride: 101 mmol/L (ref 98–111)
Creatinine, Ser: 0.9 mg/dL (ref 0.44–1.00)
GFR calc Af Amer: >60 mL/min (ref >60)
GFR calc non Af Amer: 60 mL/min — ABNORMAL LOW (ref >60)
Glucose, Bld: 106 mg/dL — ABNORMAL HIGH (ref 70–99)
Potassium: 4.2 mmol/L (ref 3.5–5.1)
Sodium: 140 mmol/L (ref 135–145)
Total Bilirubin: 0.6 mg/dL (ref 0.3–1.2)
Total Protein: 7.3 g/dL (ref 6.5–8.1)

## 2019-05-21 MED ORDER — ANASTROZOLE 1 MG PO TABS: 1.0000 mg | ORAL_TABLET | Freq: Every day | ORAL | 4 refills | Status: DC

## 2019-05-27 ENCOUNTER — Other Ambulatory Visit: Payer: Self-pay | Admitting: Rheumatology

## 2019-05-27 DIAGNOSIS — M0609 Rheumatoid arthritis without rheumatoid factor, multiple sites: Secondary | ICD-10-CM

## 2019-05-27 NOTE — Telephone Encounter (Signed)
Ok to refill PLQ.

## 2019-05-27 NOTE — Telephone Encounter (Signed)
Last Visit: 02/18/2019 Next Visit: 07/16/2019 Labs: 05/21/2019 glucose 106, ALT 49, RBC 5.29, platelets 146 Eye exam: 08/21/2018   Okay to refill plaquenil?

## 2019-06-17 NOTE — Progress Notes (Signed)
Office Visit Note  Patient: Tara Cox             Date of Birth: 03/12/1934           MRN: [id number]             PCP: [name], MD Referring: [name], MD Visit Date: 06/29/2019 Occupation: @GUAROCC @  Subjective:  Tingling in feet.   History of Present Illness: Tara Cox is a 85 y.o. female  with history of seronegative rheumatoid arthritis, CPPD, and osteoarthritis.  She is taking Plaquenil 200 mg 1 tablet in the morning and 1/2 tablet in the evening.  She states she experienced increased in anxiety. She stopped taking for 1 week and it improved. Then she had increased back pain and resumed for the last 2-3 weeks and has not experienced an increase in anxiety. Scheduled appointment with Dr. Katy Fitch in August for plaquenil eye exam.  She takes colchicine 0.6 mg 1 tablet by mouth for management of CPPD.  She reports RA and pseudogout flare since last visit.  She states she has been experiencing some tingling in the bottom of her feet. She had right knee replacement in the past. .  She uses voltaren gel topically as needed.    Activities of Daily Living:  Patient reports morning stiffness for 0  minutes.   Patient Reports nocturnal pain.  Difficulty dressing/grooming: Reports Difficulty climbing stairs: Denies Difficulty getting out of chair: Denies Difficulty using hands for taps, buttons, cutlery, and/or writing: Denies  Review of Systems  Constitutional: Negative for fatigue, night sweats, weight gain and weight loss.  HENT: Positive for mouth dryness and sore tongue. Negative for mouth sores, trouble swallowing, trouble swallowing and nose dryness.   Eyes: Negative for pain, redness, itching, visual disturbance and dryness.  Respiratory: Negative for cough, shortness of breath and difficulty breathing.   Cardiovascular: Negative for chest pain, palpitations, hypertension, irregular heartbeat and swelling in legs/feet.  Gastrointestinal:  Negative for blood in stool, constipation and diarrhea.  Endocrine: Negative for increased urination.  Genitourinary: Negative for difficulty urinating and vaginal dryness.  Musculoskeletal: Positive for arthralgias, joint pain, myalgias and myalgias. Negative for joint swelling, muscle weakness, morning stiffness and muscle tenderness.  Skin: Negative for color change, rash, hair loss, redness, skin tightness, ulcers and sensitivity to sunlight.  Allergic/Immunologic: Negative for susceptible to infections.  Neurological: Positive for numbness and headaches. Negative for dizziness, memory loss, night sweats and weakness.  Hematological: Negative for bruising/bleeding tendency and swollen glands.  Psychiatric/Behavioral: Negative for depressed mood, confusion and sleep disturbance. The patient is nervous/anxious.     PMFS History:  Patient Active Problem List   Diagnosis Date Noted   Aortic atherosclerosis ([place]) 04/01/2018   Chronic deep vein thrombosis (DVT) of left upper extremity ([place]) 01/17/2018   Rheumatoid arthritis of multiple sites with negative rheumatoid factor ([place]) 11/13/2017   Port-A-Cath in place 07/24/2017   Breast cancer, stage 2, right ([place]) 05/07/2017   Malignant neoplasm of central portion of right breast in female, estrogen receptor positive ([place]) 04/02/2017   Primary insomnia 09/27/2016   ANA positive 09/27/2016   Complete tear of right rotator cuff 05/11/2016   High risk medication use 05/07/2016   Uterine fibroid 12/08/2015   History of miscarriage 12/08/2015   Esophagitis 12/08/2015   Diaphragmatic hernia 12/08/2015   Calcium pyrophosphate deposition disease 12/08/2015   Osteoarthritis of both hands 12/08/2015   Osteoarthritis of both feet 12/08/2015   History of total right  knee replacement 12/08/2015   Chondromalacia of both patellae 12/08/2015   DDD (degenerative disc disease), lumbar 12/08/2015   Essential hypertension 06/26/2013    Paroxysmal atrial fibrillation (HCC) 06/26/2013   Hyperlipidemia 06/26/2013   DJD (degenerative joint disease) of knee 01/26/2013   Hemorrhoids 07/11/2010   IBS (irritable bowel syndrome) 07/11/2010   Fatigue/loss of sleep 07/11/2010   Night sweats 07/11/2010   Chills 07/11/2010   Weight gain 07/11/2010   Inflammatory arthritis 07/11/2010   Incontinence of urine 07/11/2010   Thrombosed external hemorrhoids 07/11/2010    Past Medical History:  Diagnosis Date   A-fib [facility])    Arthritis    Breast cancer ([place]) 05/07/2017   Right Breast Cancer   Cancer ([place])    Chondromalacia, patella 12/08/2015   DDD (degenerative disc disease), lumbar 12/08/2015   S/P discectomy    Diaphragmatic hernia 12/08/2015   Dysrhythmia    A-Fib   Edema of lower extremity 06/29/09   Lower Venous Exam- normal. No evidence of thrombus or thrombophlebitis.   Esophagitis 12/08/2015   Frequent urination at night    GERD (gastroesophageal reflux disease)    Hemorrhoids    History of calcium pyrophosphate deposition disease (CPPD) 12/08/2015   History of hiatal hernia    History of miscarriage 12/08/2015   x3   History of total right knee replacement 12/08/2015   Hypertension 02/24/09   Echo-EF 62%; Myocardial Perfusion Study-Normal. No signifcant ischemia noted.   IBS (irritable bowel syndrome)    Migraine    Osteoarthritis of both feet 12/08/2015   Osteoarthritis of both hands 12/08/2015   Personal history of chemotherapy 2019   Right Breast Cancer   Personal history of radiation therapy 2019   Right Breast Cancer   PONV (postoperative nausea and vomiting)    Uterine fibroid 12/08/2015    Family History  Problem Relation Age of Onset   Other Mother    Varicose Veins Mother    Emphysema Mother    Heart disease Father        heart attack   Diabetes Sister    Other Sister        pacemaker   Diabetes Sister    Heart disease Sister    Past Surgical  History:  Procedure Laterality Date   ABDOMINAL HYSTERECTOMY  1974   APPENDECTOMY     BACK SURGERY  2001   [place]   COLONOSCOPY     COLONOSCOPY WITH PROPOFOL N/A 05/28/2014   Procedure: COLONOSCOPY WITH PROPOFOL;  Surgeon: Carol Ada, MD;  Location: [facility];  Service: Endoscopy;  Laterality: N/A;   ESOPHAGOGASTRODUODENOSCOPY (EGD) WITH PROPOFOL N/A 05/28/2014   Procedure: ESOPHAGOGASTRODUODENOSCOPY (EGD) WITH PROPOFOL;  Surgeon: Carol Ada, MD;  Location: [facility];  Service: Endoscopy;  Laterality: N/A;   EXPLORATORY LAPAROTOMY     open procedure   EYE SURGERY Bilateral    cataract removal   MASTECTOMY Left 05/07/2017   PORT-A-CATH REMOVAL Right 07/17/2018   Procedure: PORT REMOVAL;  Surgeon: Erroll Luna, MD;  Location: [place];  Service: General;  Laterality: Right;   PORTACATH PLACEMENT Right 05/07/2017   Procedure: INSERTION PORT-A-CATH;  Surgeon: Erroll Luna, MD;  Location: [place];  Service: General;  Laterality: Right;   SHOULDER SURGERY Right    SIMPLE MASTECTOMY WITH AXILLARY SENTINEL NODE BIOPSY Right 05/07/2017   Procedure: RIGHT SIMPLE MASTECTOMY WITH AXILLARY SENTINEL NODE BIOPSY;  Surgeon: Erroll Luna, MD;  Location: [place];  Service: General;  Laterality: Right;   TOTAL KNEE ARTHROPLASTY Right 01/26/2013   DR Ronnie Derby   TOTAL KNEE ARTHROPLASTY Right 01/26/2013   Procedure: TOTAL KNEE ARTHROPLASTY;  Surgeon: Vickey Huger, MD;  Location: [place];  Service: Orthopedics;  Laterality: Right;   [place]   Social History   Social History Narrative   [phone] Unable to ask abuse questions husband with her today.   Immunization History  Administered Date(s) Administered   Influenza, High Dose Seasonal PF 10/07/2015, 10/19/2016, 10/25/2017   PFIZER SARS-COV-2 Vaccination 01/25/2019, 02/13/2019   Pneumococcal Conjugate-13 11/24/2013   Pneumococcal Polysaccharide-23  01/10/2003   Tdap 03/21/2017, 03/21/2017   Zoster 08/01/2012   Zoster Recombinat (Shingrix) 03/21/2017, 03/21/2017     Objective: Vital Signs: BP 139/81 (BP Location: Left Arm, Patient Position: Sitting, Cuff Size: Normal)    Pulse 69    Resp 15    Ht 5\' 7"  (1.702 m)    Wt 245 lb 9.6 oz (111.4 kg)    BMI 38.47 kg/m    Physical Exam Vitals and nursing note reviewed.  Constitutional:      Appearance: She is well-developed.  HENT:     Head: Normocephalic and atraumatic.  Eyes:     Conjunctiva/sclera: Conjunctivae normal.  Cardiovascular:     Rate and Rhythm: Normal rate and regular rhythm.     Heart sounds: Normal heart sounds.  Pulmonary:     Effort: Pulmonary effort is normal.     Breath sounds: Normal breath sounds.  Abdominal:     General: Bowel sounds are normal.     Palpations: Abdomen is soft.  Musculoskeletal:     Cervical back: Normal range of motion.     Right lower leg: Edema present.     Left lower leg: Edema present.  Lymphadenopathy:     Cervical: No cervical adenopathy.  Skin:    General: Skin is warm and dry.     Capillary Refill: Capillary refill takes less than 2 seconds.  Neurological:     Mental Status: She is alert and oriented to person, place, and time.  Psychiatric:        Behavior: Behavior normal.      Musculoskeletal Exam: C-spine was in good range of motion.  Shoulder joints, elbow joints, wrist joints, PIPs and DIPs with good range of motion with no synovitis.  Hip joints in good range of motion.  Her right knee joint is replaced without any warmth swelling or effusion.  She had pedal edema bilaterally without any synovitis.  CDAI Exam: CDAI Score: 0.6  Patient Global: 3 mm; Provider Global: 3 mm Swollen: 0 ; Tender: 0  Joint Exam 06/29/2019   No joint exam has been documented for this visit   There is currently no information documented on the homunculus. Go to the Rheumatology activity and complete the homunculus joint  exam.  Investigation: No additional findings.  Imaging: No results found.  Recent Labs: Lab Results  Component Value Date   WBC 4.7 05/21/2019   HGB 14.1 05/21/2019   PLT 146 (L) 05/21/2019   NA 140 05/21/2019   K 4.2 05/21/2019   CL 101 05/21/2019   CO2 28 05/21/2019   GLUCOSE 106 (H) 05/21/2019   BUN 19 05/21/2019   CREATININE 0.90 05/21/2019   BILITOT 0.6 05/21/2019   ALKPHOS 121 05/21/2019   AST 35 05/21/2019   ALT 49 (H) 05/21/2019   PROT 7.3 05/21/2019   ALBUMIN 4.1 05/21/2019   CALCIUM 9.8 05/21/2019   GFRAA >  60 05/21/2019    Speciality Comments: PLQ eye exam: 08/21/18 normal. Dr. Katy Fitch. Follow up in 1 year.  Procedures:  No procedures performed Allergies: Oxycodone, Hydrocodone, Percocet [oxycodone-acetaminophen], Soma [carisoprodol], and Ultram [tramadol hcl]   Assessment / Plan:     Visit Diagnoses: Rheumatoid arthritis of multiple sites with negative rheumatoid factor (HCC)-patient had no synovitis on examination.  She has been tolerating Plaquenil well.  Patient states she initially had anxiety when she took Plaquenil but it resolved.  Calcium pyrophosphate deposition disease-she is on colchicine and had no pseudogout flare.  High risk medication use - Plaquenil 200 mg 1 tablet in the morning and half table in the evening. PLQ eye exam: 08/21/18 normal. Dr. Katy Fitch.  Primary osteoarthritis of both hands-joint protection muscle strengthening was discussed.  Primary osteoarthritis of both feet-she has some underlying osteoarthritis.  Numbness in feet-she has been experiencing some numbness in her feet.  We discussed possible neuropathy.  Patient states she was referred to a neurologist by her PCP for headaches.  She will discuss this further with the neurologist.  Plantar fasciitis, bilateral-resolved.  History of total right knee replacement-doing well.  DDD (degenerative disc disease), lumbar-she has chronic lower back pain.  Osteopenia of multiple  sites-use of calcium vitamin D and resistive exercise was discussed.  DEXA is followed by her oncologist.  Other medical problems are listed as follows:  Paroxysmal atrial fibrillation ([place])  Pure hypercholesterolemia  Essential hypertension  Malignant neoplasm of central portion of right breast in female, estrogen receptor positive ([place])  History of DVT (deep vein thrombosis)  Pedal edema  Orders: No orders of the defined types were placed in this encounter.  No orders of the defined types were placed in this encounter.     Follow-Up Instructions: Return in about 5 months (around 11/29/2019) for Rheumatoid arthritis, Osteoarthritis.   Bo Merino, MD  Note - This record has been created using Editor, commissioning.  Chart creation errors have been sought, but may not always  have been located. Such creation errors do not reflect on  the standard of medical care.

## 2019-06-22 ENCOUNTER — Other Ambulatory Visit: Payer: Self-pay | Admitting: Rheumatology

## 2019-06-29 ENCOUNTER — Encounter: Payer: Self-pay | Admitting: Rheumatology

## 2019-06-29 ENCOUNTER — Other Ambulatory Visit: Payer: Self-pay

## 2019-06-29 ENCOUNTER — Ambulatory Visit: Payer: Medicare Other | Admitting: Rheumatology

## 2019-06-29 VITALS — BP 139/81 | HR 69 | Resp 15 | Ht 67.0 in | Wt 245.6 lb

## 2019-06-29 DIAGNOSIS — Z96651 Presence of right artificial knee joint: Secondary | ICD-10-CM

## 2019-06-29 DIAGNOSIS — Z79899 Other long term (current) drug therapy: Secondary | ICD-10-CM | POA: Diagnosis not present

## 2019-06-29 DIAGNOSIS — M0609 Rheumatoid arthritis without rheumatoid factor, multiple sites: Secondary | ICD-10-CM | POA: Diagnosis not present

## 2019-06-29 DIAGNOSIS — C50111 Malignant neoplasm of central portion of right female breast: Secondary | ICD-10-CM

## 2019-06-29 DIAGNOSIS — R200 Anesthesia of skin: Secondary | ICD-10-CM

## 2019-06-29 DIAGNOSIS — I10 Essential (primary) hypertension: Secondary | ICD-10-CM

## 2019-06-29 DIAGNOSIS — Z170 Estrogen receptor positive status [ER+]: Secondary | ICD-10-CM

## 2019-06-29 DIAGNOSIS — M19041 Primary osteoarthritis, right hand: Secondary | ICD-10-CM

## 2019-06-29 DIAGNOSIS — M8589 Other specified disorders of bone density and structure, multiple sites: Secondary | ICD-10-CM

## 2019-06-29 DIAGNOSIS — R600 Localized edema: Secondary | ICD-10-CM

## 2019-06-29 DIAGNOSIS — E7800 Pure hypercholesterolemia, unspecified: Secondary | ICD-10-CM

## 2019-06-29 DIAGNOSIS — M1120 Other chondrocalcinosis, unspecified site: Secondary | ICD-10-CM | POA: Diagnosis not present

## 2019-06-29 DIAGNOSIS — M5136 Other intervertebral disc degeneration, lumbar region: Secondary | ICD-10-CM

## 2019-06-29 DIAGNOSIS — Z86718 Personal history of other venous thrombosis and embolism: Secondary | ICD-10-CM

## 2019-06-29 DIAGNOSIS — M19072 Primary osteoarthritis, left ankle and foot: Secondary | ICD-10-CM

## 2019-06-29 DIAGNOSIS — M19071 Primary osteoarthritis, right ankle and foot: Secondary | ICD-10-CM

## 2019-06-29 DIAGNOSIS — M722 Plantar fascial fibromatosis: Secondary | ICD-10-CM

## 2019-06-29 DIAGNOSIS — M19042 Primary osteoarthritis, left hand: Secondary | ICD-10-CM

## 2019-06-29 DIAGNOSIS — I480 Paroxysmal atrial fibrillation: Secondary | ICD-10-CM

## 2019-06-29 NOTE — Patient Instructions (Signed)
Standing Labs We placed an order today for your standing lab work.   Please have your standing labs drawn in October   If possible, please have your labs drawn 2 weeks prior to your appointment so that the provider can discuss your results at your appointment.  We have open lab daily Monday through Thursday from 8:30-12:30 PM and 1:30-4:30 PM and Friday from 8:30-12:30 PM and 1:30-4:00 PM at the office of Dr. Bo Merino, [facility].   You may experience shorter wait times on Monday and Friday afternoons. The office is located at [address], [place], [place], [place] [address] No appointment is necessary.   Labs are drawn by [facility].  You may receive a bill from [facility] for your lab work.  If you wish to have your labs drawn at another location, please call the office 24 hours in advance to send orders.  If you have any questions regarding directions or hours of operation,  please call [phone].   As a reminder, please drink plenty of water prior to coming for your lab work. Thanks!

## 2019-07-07 ENCOUNTER — Other Ambulatory Visit: Payer: Self-pay | Admitting: Rheumatology

## 2019-07-08 NOTE — Telephone Encounter (Signed)
Last Visit: 06/29/2019 Next Visit: 12/07/2019 Labs: 05/21/2019 RBC 5.29, Platelets 146, Glucose 106, ALT 49  Current Dose per office note 02/18/2019: Colchicine 0.6 mg 1 tablet daily.     Okay to refill Colchicine?

## 2019-07-16 ENCOUNTER — Ambulatory Visit: Payer: Medicare Other | Admitting: Rheumatology

## 2019-07-28 ENCOUNTER — Other Ambulatory Visit (HOSPITAL_COMMUNITY): Payer: Self-pay | Admitting: Cardiology

## 2019-07-28 ENCOUNTER — Other Ambulatory Visit: Payer: Self-pay | Admitting: Physician Assistant

## 2019-07-28 DIAGNOSIS — M0609 Rheumatoid arthritis without rheumatoid factor, multiple sites: Secondary | ICD-10-CM

## 2019-08-19 ENCOUNTER — Other Ambulatory Visit: Payer: Self-pay | Admitting: Physician Assistant

## 2019-08-19 DIAGNOSIS — M0609 Rheumatoid arthritis without rheumatoid factor, multiple sites: Secondary | ICD-10-CM

## 2019-08-20 NOTE — Telephone Encounter (Signed)
Last Visit: 06/29/2019 Next Visit: 12/07/2019 Labs: 05/21/2019 RBC 5.29, Platelets 146, Glucose 106, ALT 49 PLQ eye exam: 08/21/18 normal.  Current Dose per office note on 06/29/2019: Plaquenil 200 mg 1 tablet in the morning and half table in the evening  Patient advised she is due to update her PLQ eye exam. Patient will call to schedule an appointment.   Okay to refill per Dr. Estanislado Pandy

## 2019-08-26 ENCOUNTER — Telehealth: Payer: Self-pay | Admitting: Rheumatology

## 2019-08-26 NOTE — Telephone Encounter (Signed)
Patient called stating she had her Plaquenil eye exam with Dr. Katy Fitch on Monday, 08/24/19.  Patient states Dr. Katy Fitch was not happy with what he saw in her left eye and is recommending that she discontinue taking Plaquenil and try a different medication.  Patient states Dr. Katy Fitch was going to get in touch with Dr. Estanislado Pandy regarding her results.  Patient states she doesn't need the refill of Plaquenil if she will be starting another medication and requesting a return call.

## 2019-08-26 NOTE — Telephone Encounter (Signed)
Please call the patient to schedule an appointment to discuss other treatment options since she was advised to discontinue PLQ.

## 2019-08-27 NOTE — Progress Notes (Signed)
Office Visit Note  Patient: Tara Cox             Date of Birth: 11/24/1934           MRN: [id number]             PCP: [name], MD Referring: [name], MD Visit Date: 08/31/2019 Occupation: @GUAROCC @  Subjective:  Medication Management (Discuss PLQ)   History of Present Illness: Tara Cox is a 85 y.o. female with history of rheumatoid arthritis and pseudogout.  Patient states that her ophthalmology had advised her to switch Plaquenil to some other medication based on her eye examination.  We do not have records available..  She states she has been taking colchicine on a daily basis.  She continues to have some discomfort in her hands and feet.  She gets occasional swelling in her feet and her ankles.  Activities of Daily Living:  Patient reports morning stiffness for 0 none.   Patient Reports nocturnal pain.  Difficulty dressing/grooming: Denies Difficulty climbing stairs: Denies Difficulty getting out of chair: Denies Difficulty using hands for taps, buttons, cutlery, and/or writing: Denies  Review of Systems  Constitutional: Negative for fatigue.  HENT: Positive for mouth dryness.   Eyes: Positive for dryness.  Respiratory: Negative for shortness of breath.   Cardiovascular: Negative for swelling in legs/feet.  Gastrointestinal: Negative for constipation.  Endocrine: Positive for cold intolerance.  Genitourinary: Negative for difficulty urinating.  Musculoskeletal: Positive for arthralgias, gait problem, joint pain and joint swelling.  Skin: Negative for rash.  Allergic/Immunologic: Negative for susceptible to infections.  Neurological: Positive for numbness.  Hematological: Negative for bruising/bleeding tendency.  Psychiatric/Behavioral: Positive for sleep disturbance.    PMFS History:  Patient Active Problem List   Diagnosis Date Noted  . Aortic atherosclerosis ([place]) 04/01/2018  . Chronic deep vein thrombosis (DVT) of left  upper extremity ([place]) 01/17/2018  . Rheumatoid arthritis of multiple sites with negative rheumatoid factor ([place]) 11/13/2017  . Port-A-Cath in place 07/24/2017  . Breast cancer, stage 2, right ([place]) 05/07/2017  . Malignant neoplasm of central portion of right breast in female, estrogen receptor positive ([place]) 04/02/2017  . Primary insomnia 09/27/2016  . ANA positive 09/27/2016  . Complete tear of right rotator cuff 05/11/2016  . High risk medication use 05/07/2016  . Uterine fibroid 12/08/2015  . History of miscarriage 12/08/2015  . Esophagitis 12/08/2015  . Diaphragmatic hernia 12/08/2015  . Calcium pyrophosphate deposition disease 12/08/2015  . Osteoarthritis of both hands 12/08/2015  . Osteoarthritis of both feet 12/08/2015  . History of total right knee replacement 12/08/2015  . Chondromalacia of both patellae 12/08/2015  . DDD (degenerative disc disease), lumbar 12/08/2015  . Essential hypertension 06/26/2013  . Paroxysmal atrial fibrillation ([place]) 06/26/2013  . Hyperlipidemia 06/26/2013  . DJD (degenerative joint disease) of knee 01/26/2013  . Hemorrhoids 07/11/2010  . IBS (irritable bowel syndrome) 07/11/2010  . Fatigue/loss of sleep 07/11/2010  . Night sweats 07/11/2010  . Chills 07/11/2010  . Weight gain 07/11/2010  . Inflammatory arthritis 07/11/2010  . Incontinence of urine 07/11/2010  . Thrombosed external hemorrhoids 07/11/2010    Past Medical History:  Diagnosis Date  . A-fib ([place])   . Arthritis   . Breast cancer ([place]) 05/07/2017   Right Breast Cancer  . Cancer ([place])   . 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 DDD (degenerative disc disease), lumbar 12/08/2015   S/P discectomy   . Diaphragmatic hernia 12/08/2015  . Dysrhythmia    A-Fib  .  Edema of lower extremity 06/29/09   Lower Venous Exam- normal. No evidence of thrombus or thrombophlebitis.  . Esophagitis 12/08/2015  . Frequent urination at night   . GERD (gastroesophageal reflux disease)   .  Hemorrhoids   . History of calcium pyrophosphate deposition disease (CPPD) 12/08/2015  . History of hiatal hernia   . History of miscarriage 12/08/2015   x3  . History of total right knee replacement 12/08/2015  . Hypertension 02/24/09   Echo-EF 62%; Myocardial Perfusion Study-Normal. No signifcant ischemia noted.  . IBS (irritable bowel syndrome)   . Migraine   . Osteoarthritis of both feet 12/08/2015  . Osteoarthritis of both hands 12/08/2015  . Personal history of chemotherapy 2019   Right Breast Cancer  . Personal history of radiation therapy 2019   Right Breast Cancer  . PONV (postoperative nausea and vomiting)   . Uterine fibroid 12/08/2015    Family History  Problem Relation Age of Onset  . Other Mother   . Varicose Veins Mother   . Emphysema Mother   . Heart disease Father        heart attack  . Diabetes Sister   . Other Sister        pacemaker  . Diabetes Sister   . Heart disease Sister    Past Surgical History:  Procedure Laterality Date  . ABDOMINAL HYSTERECTOMY  1974  . APPENDECTOMY    . BACK SURGERY  2001  . BUNIONECTOMY  1986  . CESAREAN SECTION  1971  . COLONOSCOPY    . COLONOSCOPY WITH PROPOFOL N/A 05/28/2014   Procedure: COLONOSCOPY WITH PROPOFOL;  Surgeon: Carol Ada, MD;  Location: [facility];  Service: Endoscopy;  Laterality: N/A;  . ESOPHAGOGASTRODUODENOSCOPY (EGD) WITH PROPOFOL N/A 05/28/2014   Procedure: ESOPHAGOGASTRODUODENOSCOPY (EGD) WITH PROPOFOL;  Surgeon: Carol Ada, MD;  Location: [facility];  Service: Endoscopy;  Laterality: N/A;  . EXPLORATORY LAPAROTOMY     open procedure  . EYE SURGERY Bilateral    cataract removal  . MASTECTOMY Left 05/07/2017  . PORT-A-CATH REMOVAL Right 07/17/2018   Procedure: PORT REMOVAL;  Surgeon: Erroll Luna, MD;  Location: [place];  Service: General;  Laterality: Right;  . PORTACATH PLACEMENT Right 05/07/2017   Procedure: INSERTION PORT-A-CATH;  Surgeon: Erroll Luna, MD;   Location: [place];  Service: General;  Laterality: Right;  . SHOULDER SURGERY Right   . SIMPLE MASTECTOMY WITH AXILLARY SENTINEL NODE BIOPSY Right 05/07/2017   Procedure: RIGHT SIMPLE MASTECTOMY WITH AXILLARY SENTINEL NODE BIOPSY;  Surgeon: Erroll Luna, MD;  Location: [place];  Service: General;  Laterality: Right;  . TOTAL KNEE ARTHROPLASTY Right 01/26/2013   DR Ronnie Derby  . TOTAL KNEE ARTHROPLASTY Right 01/26/2013   Procedure: TOTAL KNEE ARTHROPLASTY;  Surgeon: Vickey Huger, MD;  Location: [place];  Service: Orthopedics;  Laterality: Right;  . [place]   Social History   Social History Narrative   [phone] Unable to ask abuse questions husband with her today.   Immunization History  Administered Date(s) Administered  . Influenza, High Dose Seasonal PF 10/07/2015, 10/19/2016, 10/25/2017  . PFIZER SARS-COV-2 Vaccination 01/25/2019, 02/13/2019  . Pneumococcal Conjugate-13 11/24/2013  . Pneumococcal Polysaccharide-23 01/10/2003  . Tdap 03/21/2017, 03/21/2017  . Zoster 08/01/2012  . Zoster Recombinat (Shingrix) 03/21/2017, 03/21/2017     Objective: Vital Signs: BP (!) 163/79 (BP Location: Left Arm, Patient Position: Sitting, Cuff Size: Normal)   Pulse 71   Resp 18   Ht 5\' 7"  (1.702 m)  Wt 243 lb 12.8 oz (110.6 kg)   BMI 38.18 kg/m    Physical Exam Vitals and nursing note reviewed.  Constitutional:      Appearance: She is well-developed.  HENT:     Head: Normocephalic and atraumatic.  Eyes:     Conjunctiva/sclera: Conjunctivae normal.  Cardiovascular:     Rate and Rhythm: Normal rate and regular rhythm.     Heart sounds: Normal heart sounds.  Pulmonary:     Effort: Pulmonary effort is normal.     Breath sounds: Normal breath sounds.  Abdominal:     General: Bowel sounds are normal.     Palpations: Abdomen is soft.  Musculoskeletal:     Cervical back: Normal range of motion.  Lymphadenopathy:     Cervical: No cervical adenopathy.  Skin:    General: Skin  is warm and dry.     Capillary Refill: Capillary refill takes less than 2 seconds.  Neurological:     Mental Status: She is alert and oriented to person, place, and time.  Psychiatric:        Behavior: Behavior normal.      Musculoskeletal Exam: C-spine was in good range of motion.  She has pain with range of motion of the lumbar spine.  Shoulder joints, elbow joints, wrist joints, MCPs, PIPs and DIPs had no synovitis on examination.  Hip joints, knee joints with good range of motion.  She has discomfort range of motion of her knee joints.  No warmth swelling or effusion was noted.  She has bilateral pedal edema with no synovitis.  There was no tenderness across MTPs.  CDAI Exam: CDAI Score: 0.6  Patient Global: 4 mm; Provider Global: 2 mm Swollen: 0 ; Tender: 0  Joint Exam 08/31/2019   No joint exam has been documented for this visit   There is currently no information documented on the homunculus. Go to the Rheumatology activity and complete the homunculus joint exam.  Investigation: No additional findings.  Imaging: No results found.  Recent Labs: Lab Results  Component Value Date   WBC 4.7 05/21/2019   HGB 14.1 05/21/2019   PLT 146 (L) 05/21/2019   NA 140 05/21/2019   K 4.2 05/21/2019   CL 101 05/21/2019   CO2 28 05/21/2019   GLUCOSE 106 (H) 05/21/2019   BUN 19 05/21/2019   CREATININE 0.90 05/21/2019   BILITOT 0.6 05/21/2019   ALKPHOS 121 05/21/2019   AST 35 05/21/2019   ALT 49 (H) 05/21/2019   PROT 7.3 05/21/2019   ALBUMIN 4.1 05/21/2019   CALCIUM 9.8 05/21/2019   GFRAA >60 05/21/2019    Speciality Comments: PLQ eye exam: 08/21/18 normal. Dr. Katy Fitch. Follow up in 1 year.  Procedures:  No procedures performed Allergies: Oxycodone, Hydrocodone, Percocet [oxycodone-acetaminophen], Soma [carisoprodol], and Ultram [tramadol hcl]   Assessment / Plan:     Visit Diagnoses: Rheumatoid arthritis of multiple sites with negative rheumatoid factor (HCC)-patient has been  doing very well on Plaquenil.  She had no synovitis on examination.  Patient states that her ophthalmologist advised her to discontinue Plaquenil.  I discussed the option of switching her to [place].  Indications side effects contraindications were discussed.  The plan is to start her on leflunomide 10 mg p.o. daily after the lab work is available.  As her LFTs are slightly elevated we will have to monitor it closely.  We will check labs in 2 weeks and then 2 months.  She most likely will do very well on low-dose  leflunomide.  Medication counseling:   Baseline Immunosuppressant Therapy Labs        No results found for: HIV     Serum Protein Electrophoresis Latest Ref Rng & Units 05/21/2019  Total Protein 6.5 - 8.1 g/dL 7.3    No results found for: G6PDH  No results found for: TPMT   Patient was counseled on the purpose, proper use, and adverse effects of leflunomide including risk of infection, nausea/diarrhea/weight loss, increase in blood pressure, rash, hair loss, tingling in the hands and feet, and signs and symptoms of interstitial lung disease.   Also counseled on Black Box warning of liver injury and importance of avoiding alcohol while on therapy. Discussed that there is the possibility of an increased risk of malignancy but it is not well understood if this increased risk is due to the medication or the disease state.  Counseled patient to avoid live vaccines. Recommend annual influenza, Pneumovax 23, Prevnar 13, and Shingrix as indicated.   Discussed the importance of frequent monitoring of liver function and blood count.  Standing orders placed.  Provided patient with educational materials on leflunomide and answered all questions.  Patient consented to [place] use, and consent will be uploaded into the media tab.   Patient dose will be 10 mg p.o. daily.  Prescription pending lab results and/or insurance approval.  Calcium pyrophosphate deposition disease -she takes colchicine.  I  have advised her to take colchicine on as needed basis if tolerated.  High risk medication use - Plaquenil 200 mg 1 tablet in the morning and half table in the evening.   Primary osteoarthritis of both hands-she has osteoarthritic changes in her hands which causes some discomfort.  Primary osteoarthritis of both feet-she is osteoarthritis in her feet but currently not having much discomfort.  Plantar fasciitis, bilateral - resolved.  History of total right knee replacement-she has chronic pain.  DDD (degenerative disc disease), lumbar-she has chronic pain.  Osteopenia of multiple sites-followed by her PCP.  Other medical problems are listed as follows:  Paroxysmal atrial fibrillation ([place])  Pure hypercholesterolemia  History of DVT (deep vein thrombosis)  Essential hypertension  Malignant neoplasm of central portion of right breast in female, estrogen receptor positive ([place])  Pedal edema  She is fully vaccinated against Covid.  I have advised her to get booster when available.  Instructions regarding Covid vaccination and monoclonal antibodies were placed in her AVS.  Orders: No orders of the defined types were placed in this encounter.  No orders of the defined types were placed in this encounter.    Follow-Up Instructions: Return in about 6 weeks (around 10/12/2019) for Rheumatoid arthritis,CPPD.   Bo Merino, MD  Note - This record has been created using Editor, commissioning.  Chart creation errors have been sought, but may not always  have been located. Such creation errors do not reflect on  the standard of medical care.

## 2019-08-27 NOTE — Telephone Encounter (Signed)
Patient scheduled for 08/31/2019 at 9:15 am to discuss treatment options.

## 2019-08-31 ENCOUNTER — Other Ambulatory Visit: Payer: Self-pay

## 2019-08-31 ENCOUNTER — Ambulatory Visit (HOSPITAL_COMMUNITY)
Admission: RE | Admit: 2019-08-31 | Discharge: 2019-08-31 | Disposition: A | Discharge status: Home / Self Care | Payer: Medicare Other | Source: Ambulatory Visit | Attending: Rheumatology | Admitting: Rheumatology

## 2019-08-31 ENCOUNTER — Encounter: Payer: Self-pay | Admitting: Rheumatology

## 2019-08-31 ENCOUNTER — Ambulatory Visit: Payer: Medicare Other | Admitting: Rheumatology

## 2019-08-31 ENCOUNTER — Telehealth: Payer: Self-pay | Admitting: Pharmacist

## 2019-08-31 VITALS — BP 163/79 | HR 71 | Resp 18 | Ht 67.0 in | Wt 243.8 lb

## 2019-08-31 DIAGNOSIS — M19042 Primary osteoarthritis, left hand: Secondary | ICD-10-CM

## 2019-08-31 DIAGNOSIS — Z79899 Other long term (current) drug therapy: Secondary | ICD-10-CM | POA: Insufficient documentation

## 2019-08-31 DIAGNOSIS — M0609 Rheumatoid arthritis without rheumatoid factor, multiple sites: Secondary | ICD-10-CM

## 2019-08-31 DIAGNOSIS — M1120 Other chondrocalcinosis, unspecified site: Secondary | ICD-10-CM

## 2019-08-31 DIAGNOSIS — I10 Essential (primary) hypertension: Secondary | ICD-10-CM

## 2019-08-31 DIAGNOSIS — M19071 Primary osteoarthritis, right ankle and foot: Secondary | ICD-10-CM

## 2019-08-31 DIAGNOSIS — Z170 Estrogen receptor positive status [ER+]: Secondary | ICD-10-CM

## 2019-08-31 DIAGNOSIS — M19072 Primary osteoarthritis, left ankle and foot: Secondary | ICD-10-CM

## 2019-08-31 DIAGNOSIS — I480 Paroxysmal atrial fibrillation: Secondary | ICD-10-CM

## 2019-08-31 DIAGNOSIS — E7800 Pure hypercholesterolemia, unspecified: Secondary | ICD-10-CM

## 2019-08-31 DIAGNOSIS — M5136 Other intervertebral disc degeneration, lumbar region: Secondary | ICD-10-CM

## 2019-08-31 DIAGNOSIS — M19041 Primary osteoarthritis, right hand: Secondary | ICD-10-CM | POA: Diagnosis not present

## 2019-08-31 DIAGNOSIS — C50111 Malignant neoplasm of central portion of right female breast: Secondary | ICD-10-CM

## 2019-08-31 DIAGNOSIS — M51369 Other intervertebral disc degeneration, lumbar region without mention of lumbar back pain or lower extremity pain: Secondary | ICD-10-CM

## 2019-08-31 DIAGNOSIS — Z86718 Personal history of other venous thrombosis and embolism: Secondary | ICD-10-CM

## 2019-08-31 DIAGNOSIS — M722 Plantar fascial fibromatosis: Secondary | ICD-10-CM

## 2019-08-31 DIAGNOSIS — Z96651 Presence of right artificial knee joint: Secondary | ICD-10-CM

## 2019-08-31 DIAGNOSIS — R600 Localized edema: Secondary | ICD-10-CM

## 2019-08-31 DIAGNOSIS — M8589 Other specified disorders of bone density and structure, multiple sites: Secondary | ICD-10-CM

## 2019-08-31 NOTE — Progress Notes (Signed)
Active lung disease is noted.  She had some calcification in the blood vessels.  Arthritis was noted in the thoracic spine.  Please forward x-ray report to her PCP.

## 2019-08-31 NOTE — Progress Notes (Signed)
Pharmacy Note  Subjective: Patient presents today to the [facility] for follow up office visit.  Patient seen by the pharmacist for counseling on leflunomide Tara Cox) for rheumatoid arthritis.  She was on Plaquenil but opthalmologist recommended to discontinue due to decreased cube volume OS.  Objective: CBC    Component Value Date/Time   WBC 4.7 05/21/2019 1229   RBC 5.29 (H) 05/21/2019 1229   HGB 14.1 05/21/2019 1229   HGB 13.4 10/22/2006 0841   HCT 44.1 05/21/2019 1229   HCT 40.1 10/22/2006 0841   PLT 146 (L) 05/21/2019 1229   PLT 203 10/22/2006 0841   MCV 83.4 05/21/2019 1229   MCV 81.4 10/22/2006 0841   MCH 26.7 05/21/2019 1229   MCHC 32.0 05/21/2019 1229   RDW 14.1 05/21/2019 1229   RDW 14.6 (H) 10/22/2006 0841   LYMPHSABS 2.4 05/21/2019 1229   LYMPHSABS 3.4 (H) 10/22/2006 0841   MONOABS 0.4 05/21/2019 1229   MONOABS 0.4 10/22/2006 0841   EOSABS 0.0 05/21/2019 1229   EOSABS 0.0 10/22/2006 0841   BASOSABS 0.0 05/21/2019 1229   BASOSABS 0.0 10/22/2006 0841    CMP     Component Value Date/Time   NA 140 05/21/2019 1229   NA 142 09/22/2015 0000   K 4.2 05/21/2019 1229   CL 101 05/21/2019 1229   CO2 28 05/21/2019 1229   GLUCOSE 106 (H) 05/21/2019 1229   BUN 19 05/21/2019 1229   BUN 19 09/22/2015 0000   CREATININE 0.90 05/21/2019 1229   CREATININE 0.93 (H) 02/18/2019 1108   CALCIUM 9.8 05/21/2019 1229   PROT 7.3 05/21/2019 1229   ALBUMIN 4.1 05/21/2019 1229   AST 35 05/21/2019 1229   ALT 49 (H) 05/21/2019 1229   ALKPHOS 121 05/21/2019 1229   BILITOT 0.6 05/21/2019 1229   GFRNONAA 60 (L) 05/21/2019 1229   GFRNONAA 57 (L) 02/18/2019 1108   GFRAA >60 05/21/2019 1229   GFRAA 66 02/18/2019 1108    Baseline Immunosuppressant Therapy Labs      No results found for: HIV     Serum Protein Electrophoresis Latest Ref Rng & Units 05/21/2019  Total Protein 6.5 - 8.1 g/dL 7.3    No results found for: G6PDH  No results found for: TPMT    Pregnancy status:  Hysterectomy   Assessment/Plan:  Patient was counseled on the purpose, proper use, and adverse effects of leflunomide including risk of infection, nausea/diarrhea/weight loss, increase in blood pressure, rash, hair loss, tingling in the hands and feet, and signs and symptoms of interstitial lung disease.   Also counseled on Black Box warning of liver injury and importance of avoiding alcohol while on therapy. Discussed that there is the possibility of an increased risk of malignancy but it is not well understood if this increased risk is due to the medication or the disease state.  Counseled patient to avoid live vaccines. Recommend annual influenza, Pneumovax 23, Prevnar 13, and Shingrix as indicated.   Discussed the importance of frequent monitoring of liver function and blood count.  Standing orders placed.  Discussed importance of birth control while on leflunomide due to risk of congenital abnormalities, and patient confirms hysterectomy.  Provided patient with educational materials on leflunomide and answered all questions.  Patient consented to [place] use, and consent will be uploaded into the media tab.    Patient dose will be Arava 10 mg daily. We will keep her on low dose Arava due to elevated LFT's and can consider 10 mg every other day  if need be in the future. Prescription pending lab results and/or insurance approval.   Mariella Saa, PharmD, BCACP, CPP Clinical Specialty Pharmacist (Rheumatology and Pulmonology)  08/31/2019 10:21 AM

## 2019-08-31 NOTE — Patient Instructions (Signed)

## 2019-08-31 NOTE — Telephone Encounter (Signed)
Dose will be Arava 10 mg daily pending lab results and chest x-ray.  Request prescription be sent [facility] not [facility] order.  She also reports recurrent oral thrush and will call her oncologist to discuss.

## 2019-09-01 ENCOUNTER — Other Ambulatory Visit: Payer: Self-pay | Admitting: *Deleted

## 2019-09-01 ENCOUNTER — Telehealth: Payer: Self-pay | Admitting: *Deleted

## 2019-09-01 DIAGNOSIS — R7989 Other specified abnormal findings of blood chemistry: Secondary | ICD-10-CM

## 2019-09-01 MED ORDER — NYSTATIN 100000 UNIT/ML MT SUSP: OROMUCOSAL | 3 refills | Status: DC

## 2019-09-01 NOTE — Telephone Encounter (Signed)
Per MD prescription for nystatin suspension sent to pt's pharmacy ( diflucan had noted interaction with colchicine).  Pt notified of above.

## 2019-09-01 NOTE — Progress Notes (Signed)
CBC and CMP are stable.

## 2019-09-01 NOTE — Telephone Encounter (Addendum)
Pt is calling Dr Jannifer Rodney per follow up from previous appointment and noted " infection in my mouth" " he said if it did not get better to call him and let him know and would prescribe another medication "  Pt states she is in the process at Dr Graylon Good ( rheumatology ) of switching from Plaquenil to Avapro.  The pharmacist at the rheumatology office spoke with pt and it was noted the thrush in her mouth was not fully healed up enough to start the Avapro- pt stated your comment at last visit and told them she would contact this office.  This note will be given to MD- pt's pharmacy verified.  Per MD prescription for nystatin suspension sent to pharmacy and pt made aware.  Pt request notification of above to be sent to Dr Crissie Figures " because they did not want to dispense the Avapro until I got new medication from Dr Jana Hakim for my mouth ".  This note will be routed to the pt's rheumatologist.

## 2019-09-02 LAB — HEPATITIS B SURFACE ANTIGEN: Hepatitis B Surface Ag: NONREACTIVE

## 2019-09-02 LAB — CBC WITH DIFFERENTIAL/PLATELET
Absolute Monocytes: 396 cells/uL (ref 200–950)
Basophils Absolute: 32 cells/uL (ref 0–200)
Basophils Relative: 0.7 %
Eosinophils Absolute: 99 cells/uL (ref 15–500)
Eosinophils Relative: 2.2 %
HCT: 41.9 % (ref 35.0–45.0)
Hemoglobin: 13.5 g/dL (ref 11.7–15.5)
Lymphs Abs: 2367 cells/uL (ref 850–3900)
MCH: 26.5 pg — ABNORMAL LOW (ref 27.0–33.0)
MCHC: 32.2 g/dL (ref 32.0–36.0)
MCV: 82.3 fL (ref 80.0–100.0)
MPV: 11.6 fL (ref 7.5–12.5)
Monocytes Relative: 8.8 %
Neutro Abs: 1607 cells/uL (ref 1500–7800)
Neutrophils Relative %: 35.7 %
Platelets: 150 10*3/uL (ref 140–400)
RBC: 5.09 10*6/uL (ref 3.80–5.10)
RDW: 13.5 % (ref 11.0–15.0)
Total Lymphocyte: 52.6 %
WBC: 4.5 10*3/uL (ref 3.8–10.8)

## 2019-09-02 LAB — HEPATITIS B CORE ANTIBODY, IGM: Hep B C IgM: NONREACTIVE

## 2019-09-02 LAB — QUANTIFERON-TB GOLD PLUS
Mitogen-NIL: >10 IU/mL
NIL: 0.03 IU/mL
QuantiFERON-TB Gold Plus: NEGATIVE
TB1-NIL: 0 IU/mL
TB2-NIL: 0 IU/mL

## 2019-09-02 LAB — COMPLETE METABOLIC PANEL WITH GFR
AG Ratio: 1.6 (calc) (ref 1.0–2.5)
ALT: 45 U/L — ABNORMAL HIGH (ref 6–29)
AST: 38 U/L — ABNORMAL HIGH (ref 10–35)
Albumin: 4.1 g/dL (ref 3.6–5.1)
Alkaline phosphatase (APISO): 116 U/L (ref 37–153)
BUN/Creatinine Ratio: 16 (calc) (ref 6–22)
BUN: 15 mg/dL (ref 7–25)
CO2: 28 mmol/L (ref 20–32)
Calcium: 9.9 mg/dL (ref 8.6–10.4)
Chloride: 102 mmol/L (ref 98–110)
Creat: 0.92 mg/dL — ABNORMAL HIGH (ref 0.60–0.88)
GFR, Est African American: 67 mL/min/{1.73_m2} (ref >=60)
GFR, Est Non African American: 58 mL/min/{1.73_m2} — ABNORMAL LOW (ref >=60)
Globulin: 2.6 g/dL (calc) (ref 1.9–3.7)
Glucose, Bld: 82 mg/dL (ref 65–99)
Potassium: 3.9 mmol/L (ref 3.5–5.3)
Sodium: 142 mmol/L (ref 135–146)
Total Bilirubin: 0.6 mg/dL (ref 0.2–1.2)
Total Protein: 6.7 g/dL (ref 6.1–8.1)

## 2019-09-02 LAB — HEPATITIS C ANTIBODY
Hepatitis C Ab: NONREACTIVE
SIGNAL TO CUT-OFF: 0.01 (ref <1.00)

## 2019-09-02 LAB — IGG, IGA, IGM
IgG (Immunoglobin G), Serum: 918 mg/dL (ref 600–1540)
IgM, Serum: 119 mg/dL (ref 50–300)
Immunoglobulin A: 179 mg/dL (ref 70–320)

## 2019-09-02 LAB — PROTEIN ELECTROPHORESIS, SERUM, WITH REFLEX
Albumin ELP: 4 g/dL (ref 3.8–4.8)
Alpha 1: 0.3 g/dL (ref 0.2–0.3)
Alpha 2: 0.8 g/dL (ref 0.5–0.9)
Beta 2: 0.4 g/dL (ref 0.2–0.5)
Beta Globulin: 0.5 g/dL (ref 0.4–0.6)
Gamma Globulin: 0.9 g/dL (ref 0.8–1.7)
Total Protein: 6.7 g/dL (ref 6.1–8.1)

## 2019-09-02 LAB — HIV ANTIBODY (ROUTINE TESTING W REFLEX): HIV 1&2 Ab, 4th Generation: NONREACTIVE

## 2019-09-02 NOTE — Telephone Encounter (Signed)
Per Dr. Estanislado Pandy we discussed starting her on leflunomide.   Her LFTs are elevated so she will not be able to start on leflunomide unless her LFTs normalize.  We will have to schedule an appointment to discuss other treatment options. Per Dr.Deveshwar advised to get AST and ALT in 2 weeks. We can schedule appointment after lab results are available.

## 2019-09-02 NOTE — Telephone Encounter (Signed)
Please get AST and ALT in 2 weeks.  We can schedule appointment after lab results are available.

## 2019-09-02 NOTE — Addendum Note (Signed)
Addended by: Carole Binning on: 09/02/2019 04:31 PM   Modules accepted: Orders

## 2019-09-02 NOTE — Telephone Encounter (Signed)
Patient advised to get AST and ALT in 2 weeks.  We can schedule appointment after lab results are available.

## 2019-09-02 NOTE — Telephone Encounter (Signed)
We discussed starting her on leflunomide.   Her LFTs are elevated so she will not be able to start on leflunomide unless her LFTs normalize.  We will have to schedule an appointment to discuss other treatment options.

## 2019-09-03 ENCOUNTER — Telehealth: Payer: Self-pay | Admitting: Rheumatology

## 2019-09-03 NOTE — Telephone Encounter (Signed)
Correction on chest x-ray note from August 31, 2019-there was no active lung disease.  Calcification was noted in the blood vessels. Bo Merino, MD

## 2019-09-03 NOTE — Progress Notes (Signed)
LFTs are still elevated but is stable.  We discussed adding Arava at the last visit.  Please confirm that we have consent on file.  Please send  a prescription for leflunomide 10 mg p.o. daily.  She should get labs in 2 weeks which should include CBC with differential and CMP with GFR and then in 2 months.  We will not increase the dose of leflunomide.

## 2019-09-15 ENCOUNTER — Telehealth: Payer: Self-pay | Admitting: Rheumatology

## 2019-09-15 ENCOUNTER — Ambulatory Visit: Payer: Medicare Other | Attending: Internal Medicine

## 2019-09-15 DIAGNOSIS — Z23 Encounter for immunization: Secondary | ICD-10-CM

## 2019-09-15 NOTE — Telephone Encounter (Signed)
Attempted to contact the patient and left message for patient to call the office.  

## 2019-09-15 NOTE — Telephone Encounter (Signed)
Patient called requesting a return call to let her know if Dr. Estanislado Pandy recommends that she get the booster vaccine.

## 2019-09-15 NOTE — Telephone Encounter (Signed)

## 2019-09-15 NOTE — Progress Notes (Signed)
   Covid-19 Vaccination Clinic  Name:  Tara Cox    MRN: [id number] DOB: 1934-11-02  09/15/2019  Tara Cox was observed post Covid-19 immunization for 30 minutes based on pre-vaccination screening without incident. She was provided with Vaccine Information Sheet and instruction to access the V-Safe system.   Tara Cox was instructed to call 911 with any severe reactions post vaccine: Marland Kitchen Difficulty breathing  . Swelling of face and throat  . A fast heartbeat  . A bad rash all over body  . Dizziness and weakness

## 2019-09-16 ENCOUNTER — Other Ambulatory Visit: Payer: Self-pay

## 2019-09-16 ENCOUNTER — Other Ambulatory Visit: Payer: Self-pay | Admitting: *Deleted

## 2019-09-16 DIAGNOSIS — C50111 Malignant neoplasm of central portion of right female breast: Secondary | ICD-10-CM

## 2019-09-16 DIAGNOSIS — R7989 Other specified abnormal findings of blood chemistry: Secondary | ICD-10-CM

## 2019-09-16 DIAGNOSIS — Z170 Estrogen receptor positive status [ER+]: Secondary | ICD-10-CM

## 2019-09-17 LAB — AST: AST: 34 U/L (ref 10–35)

## 2019-09-17 LAB — ALT: ALT: 42 U/L — ABNORMAL HIGH (ref 6–29)

## 2019-09-17 NOTE — Progress Notes (Signed)
ALT is mildly elevated most likely due to statin use.  Please forward labs to her PCP.

## 2019-09-22 IMAGING — [facility] DIGITAL DIAGNOSTIC BILATERAL MAMMOGRAM WITH TOMO AND CAD
8 of 12 series · 8 of 32 positions shown · non-contrast
Comparison: Previous exam(s).

CLINICAL DATA: Delayed follow-up for probably benign right breast
calcifications. The patient states she has noticed right nipple
inversion over course of the past several months.

EXAM:
DIGITAL DIAGNOSTIC BILATERAL MAMMOGRAM WITH CAD AND TOMO
RIGHT BREAST ULTRASOUND

[R CC]
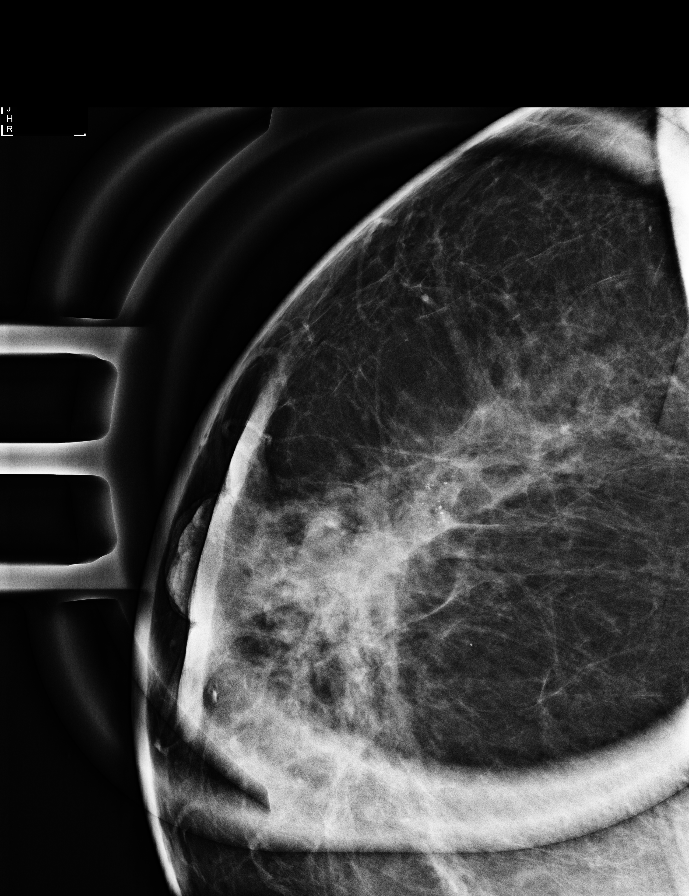

[R ML]
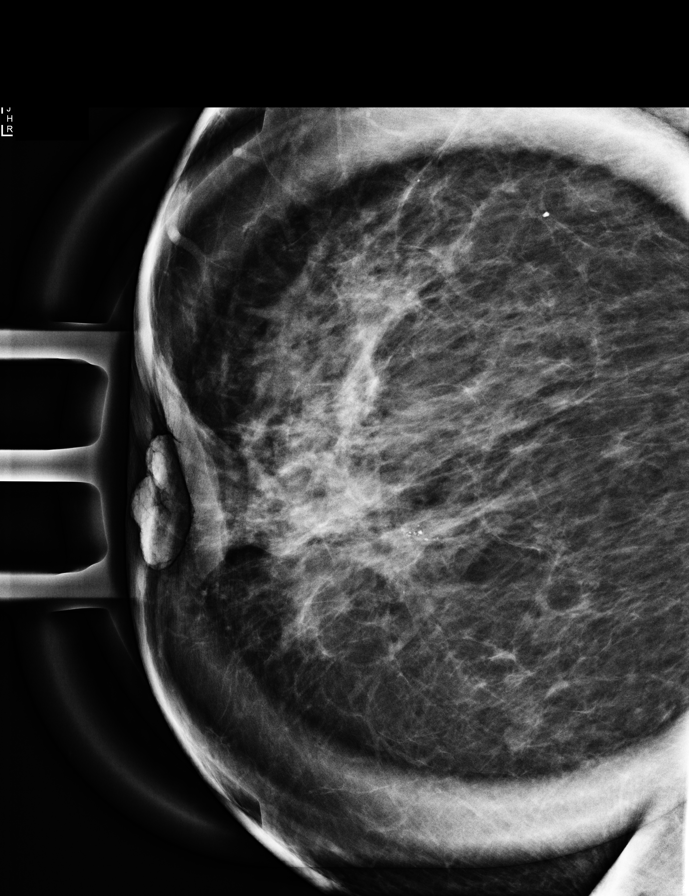

[L CC synth-2D]
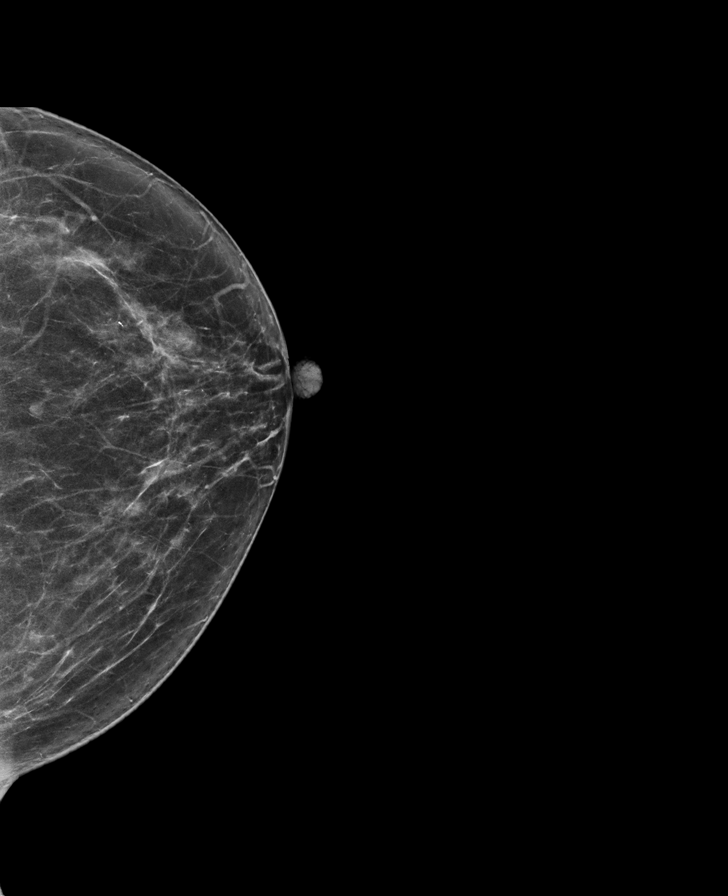

[R MLO synth-2D (1 of 2)]
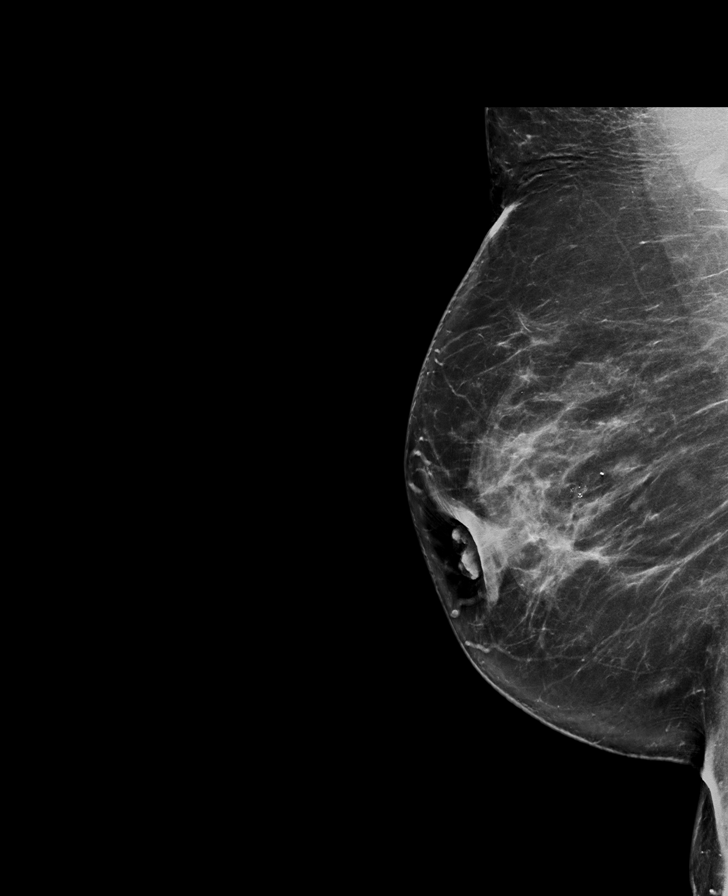

[R CC synth-2D]
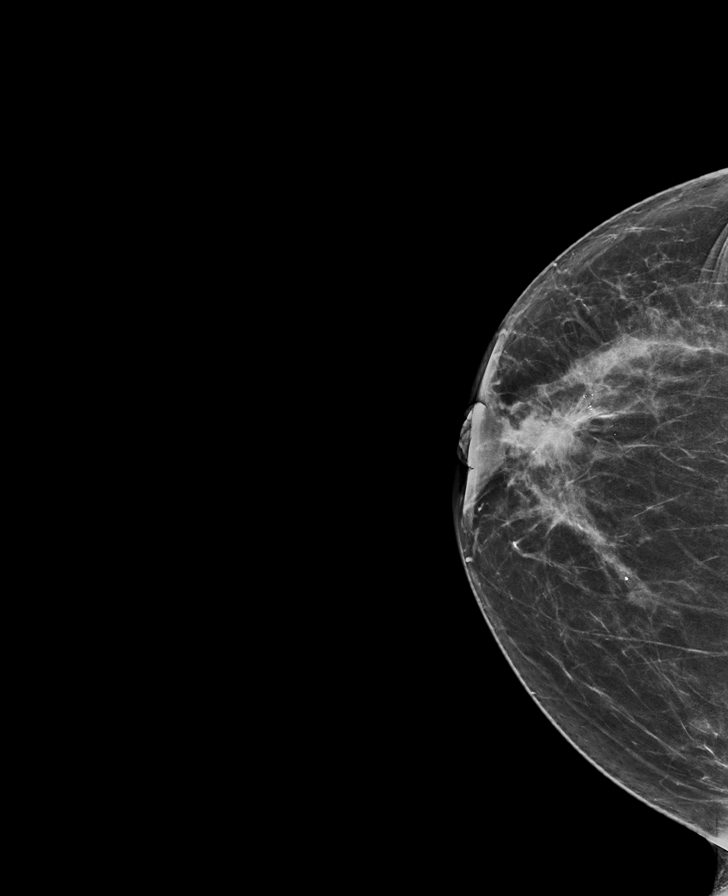

[R MLO synth-2D (2 of 2)]
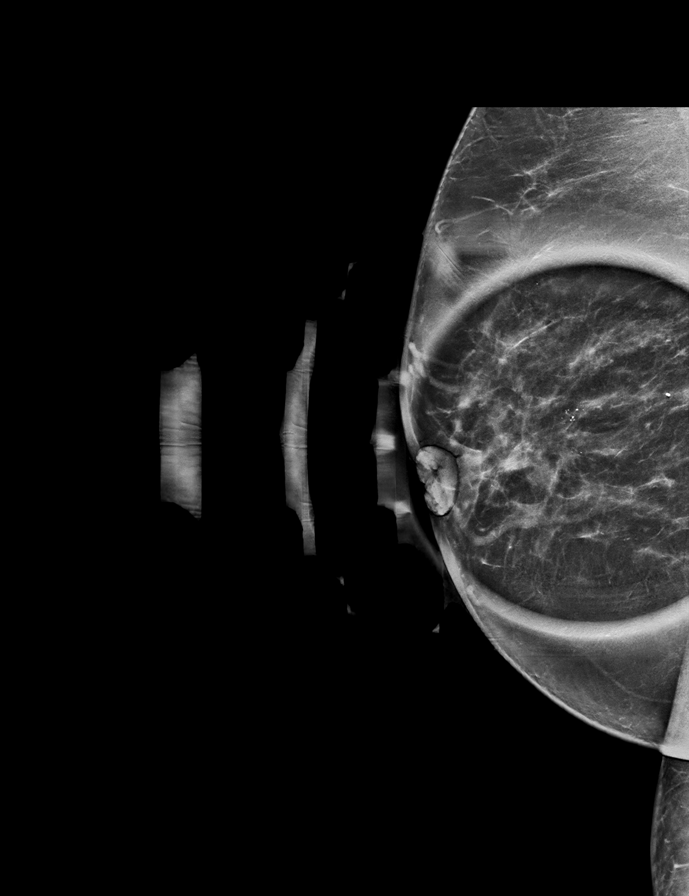

[L MLO synth-2D]
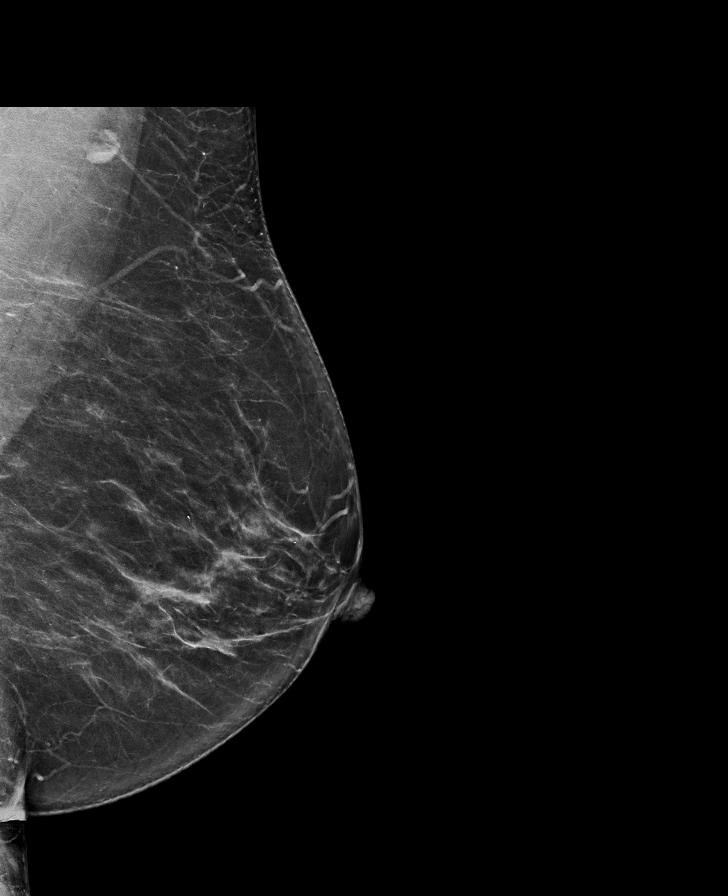

[L MLO tomo · tomo slice 39/76.0]
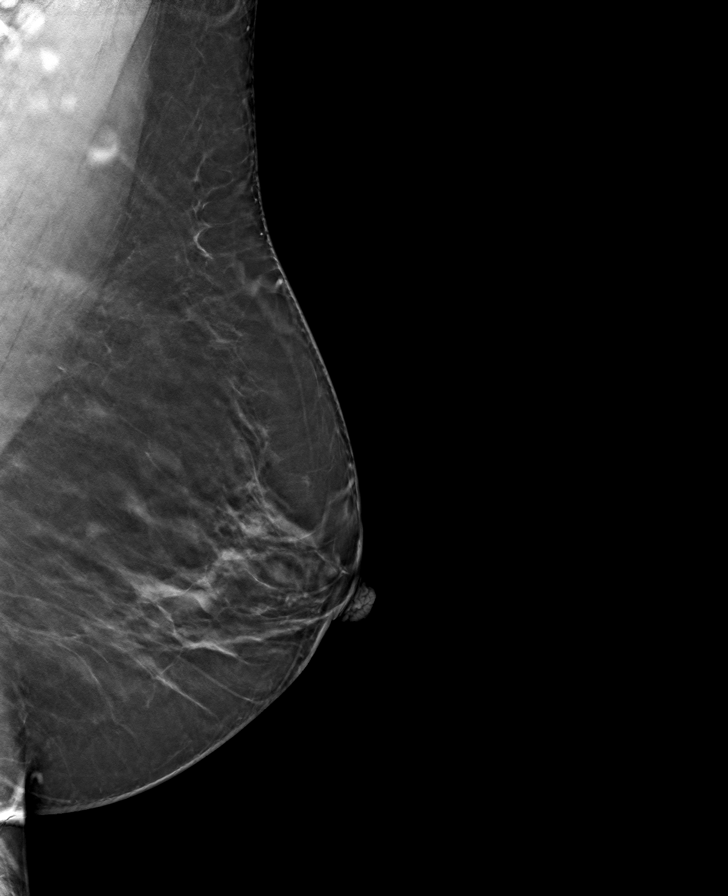

[8 of 32 positions shown; findings below may reference images not displayed]

ACR Breast Density Category b: There are scattered areas of
fibroglandular density.
FINDINGS: There is an irregular spiculated mass in the retroareolar right
breast with associated calcifications with mass and calcifications
together measuring approximately 3.3 cm. There is associated
retraction of the right nipple. No suspicious masses or
calcifications are seen in the left breast.

Mammographic images were processed with CAD.

Physical examination of the right breast reveals a firm fixed right
retroareolar mass with associated flattening of the nipple. This is
tender to palpation.

Targeted ultrasound of the retroareolar right breast was performed
demonstrating an irregular hypoechoic mass in the immediate
retroareolar right breast measuring approximately 2.3 x 1.5 x
cm. This corresponds well with the mass seen in the right breast at
mammography. There are 2 lymph nodes in the right axilla with
borderline thickened cortices, 1 of which measures 1 x 0.9 x 0.6 cm
with a cortex thickness of 0.4 mm.
IMPRESSION: 1. Highly suspicious right retroareolar mass with associated
calcifications, together the mass and calcifications measure up to
3.3 cm mammographically.

2.  Borderline right axillary lymph node.

RECOMMENDATION:
1. Ultrasound-guided biopsy of the retroareolar right breast mass is
recommended.

2. Ultrasound-guided biopsy of 1 of the lymph nodes in the low right
axilla containing a borderline thickened cortex.

I have discussed the findings and recommendations with the patient.
Results were also provided in writing at the conclusion of the
visit. If applicable, a reminder letter will be sent to the patient
regarding the next appointment.

BI-RADS CATEGORY  5: Highly suggestive of malignancy.

## 2019-10-13 NOTE — Progress Notes (Deleted)
Office Visit Note  Patient: Tara Cox             Date of Birth: 09/12/1934           MRN: [id number]             PCP: [name], MD Referring: [name], MD Visit Date: 10/27/2019 Occupation: @GUAROCC @  Subjective:  No chief complaint on file.   History of Present Illness: Tara Cox is a 85 y.o. female ***   Activities of Daily Living:  Patient reports morning stiffness for *** {minute/hour:19697}.   Patient {ACTIONS;DENIES/REPORTS:21021675::"Denies"} nocturnal pain.  Difficulty dressing/grooming: {ACTIONS;DENIES/REPORTS:21021675::"Denies"} Difficulty climbing stairs: {ACTIONS;DENIES/REPORTS:21021675::"Denies"} Difficulty getting out of chair: {ACTIONS;DENIES/REPORTS:21021675::"Denies"} Difficulty using hands for taps, buttons, cutlery, and/or writing: {ACTIONS;DENIES/REPORTS:21021675::"Denies"}  No Rheumatology ROS completed.   PMFS History:  Patient Active Problem List   Diagnosis Date Noted  . Aortic atherosclerosis ([place]) 04/01/2018  . Chronic deep vein thrombosis (DVT) of left upper extremity ([place]) 01/17/2018  . Rheumatoid arthritis of multiple sites with negative rheumatoid factor ([place]) 11/13/2017  . Port-A-Cath in place 07/24/2017  . Breast cancer, stage 2, right ([place]) 05/07/2017  . Malignant neoplasm of central portion of right breast in female, estrogen receptor positive ([place]) 04/02/2017  . Primary insomnia 09/27/2016  . ANA positive 09/27/2016  . Complete tear of right rotator cuff 05/11/2016  . High risk medication use 05/07/2016  . Uterine fibroid 12/08/2015  . History of miscarriage 12/08/2015  . Esophagitis 12/08/2015  . Diaphragmatic hernia 12/08/2015  . Calcium pyrophosphate deposition disease 12/08/2015  . Osteoarthritis of both hands 12/08/2015  . Osteoarthritis of both feet 12/08/2015  . History of total right knee replacement 12/08/2015  . Chondromalacia of both patellae 12/08/2015  . DDD (degenerative disc  disease), lumbar 12/08/2015  . Essential hypertension 06/26/2013  . Paroxysmal atrial fibrillation ([place]) 06/26/2013  . Hyperlipidemia 06/26/2013  . DJD (degenerative joint disease) of knee 01/26/2013  . Hemorrhoids 07/11/2010  . IBS (irritable bowel syndrome) 07/11/2010  . Fatigue/loss of sleep 07/11/2010  . Night sweats 07/11/2010  . Chills 07/11/2010  . Weight gain 07/11/2010  . Inflammatory arthritis 07/11/2010  . Incontinence of urine 07/11/2010  . Thrombosed external hemorrhoids 07/11/2010    Past Medical History:  Diagnosis Date  . A-fib ([place])   . Arthritis   . Breast cancer ([place]) 05/07/2017   Right Breast Cancer  . Cancer ([place])   . 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 DDD (degenerative disc disease), lumbar 12/08/2015   S/P discectomy   . Diaphragmatic hernia 12/08/2015  . Dysrhythmia    A-Fib  . Edema of lower extremity 06/29/09   Lower Venous Exam- normal. No evidence of thrombus or thrombophlebitis.  . Esophagitis 12/08/2015  . Frequent urination at night   . GERD (gastroesophageal reflux disease)   . Hemorrhoids   . History of calcium pyrophosphate deposition disease (CPPD) 12/08/2015  . History of hiatal hernia   . History of miscarriage 12/08/2015   x3  . History of total right knee replacement 12/08/2015  . Hypertension 02/24/09   Echo-EF 62%; Myocardial Perfusion Study-Normal. No signifcant ischemia noted.  . IBS (irritable bowel syndrome)   . Migraine   . Osteoarthritis of both feet 12/08/2015  . Osteoarthritis of both hands 12/08/2015  . Personal history of chemotherapy 2019   Right Breast Cancer  . Personal history of radiation therapy 2019   Right Breast Cancer  . PONV (postoperative nausea and vomiting)   . Uterine fibroid 12/08/2015  Family History  Problem Relation Age of Onset  . Other Mother   . Varicose Veins Mother   . Emphysema Mother   . Heart disease Father        heart attack  . Diabetes Sister   . Other Sister         pacemaker  . Diabetes Sister   . Heart disease Sister    Past Surgical History:  Procedure Laterality Date  . ABDOMINAL HYSTERECTOMY  1974  . APPENDECTOMY    . BACK SURGERY  2001  . BUNIONECTOMY  1986  . CESAREAN SECTION  1971  . COLONOSCOPY    . COLONOSCOPY WITH PROPOFOL N/A 05/28/2014   Procedure: COLONOSCOPY WITH PROPOFOL;  Surgeon: Carol Ada, MD;  [facility];  Service: Endoscopy;  Laterality: N/A;  . ESOPHAGOGASTRODUODENOSCOPY (EGD) WITH PROPOFOL N/A 05/28/2014   Procedure: ESOPHAGOGASTRODUODENOSCOPY (EGD) WITH PROPOFOL;  Surgeon: Carol Ada, MD;  [facility];  Service: Endoscopy;  Laterality: N/A;  . EXPLORATORY LAPAROTOMY     open procedure  . EYE SURGERY Bilateral    cataract removal  . MASTECTOMY Left 05/07/2017  . PORT-A-CATH REMOVAL Right 07/17/2018   Procedure: PORT REMOVAL;  Surgeon: Erroll Luna, MD;  Location: [facility];  Service: General;  Laterality: Right;  . PORTACATH PLACEMENT Right 05/07/2017   Procedure: INSERTION PORT-A-CATH;  Surgeon: Erroll Luna, MD;  Location: [facility];  Service: General;  Laterality: Right;  . SHOULDER SURGERY Right   . SIMPLE MASTECTOMY WITH AXILLARY SENTINEL NODE BIOPSY Right 05/07/2017   Procedure: RIGHT SIMPLE MASTECTOMY WITH AXILLARY SENTINEL NODE BIOPSY;  Surgeon: Erroll Luna, MD;  Location: [facility];  Service: General;  Laterality: Right;  . TOTAL KNEE ARTHROPLASTY Right 01/26/2013   DR Ronnie Derby  . TOTAL KNEE ARTHROPLASTY Right 01/26/2013   Procedure: TOTAL KNEE ARTHROPLASTY;  Surgeon: Vickey Huger, MD;  Location: [facility];  Service: Orthopedics;  Laterality: Right;  . [place]   Social History   Social History Narrative   [phone] Unable to ask abuse questions husband with her today.   Immunization History  Administered Date(s) Administered  . Influenza, High Dose Seasonal PF 10/07/2015, 10/19/2016, 10/25/2017  . PFIZER SARS-COV-2 Vaccination 01/25/2019, 02/13/2019,  09/15/2019  . Pneumococcal Conjugate-13 11/24/2013  . Pneumococcal Polysaccharide-23 01/10/2003  . Tdap 03/21/2017, 03/21/2017  . Zoster 08/01/2012  . Zoster Recombinat (Shingrix) 03/21/2017, 03/21/2017     Objective: Vital Signs: There were no vitals taken for this visit.   Physical Exam   Musculoskeletal Exam: ***  CDAI Exam: CDAI Score: -- Patient Global: --; Provider Global: -- Swollen: --; Tender: -- Joint Exam 10/27/2019   No joint exam has been documented for this visit   There is currently no information documented on the homunculus. Go to the Rheumatology activity and complete the homunculus joint exam.  Investigation: No additional findings.  Imaging: No results found.  Recent Labs: Lab Results  Component Value Date   WBC 4.5 08/31/2019   HGB 13.5 08/31/2019   PLT 150 08/31/2019   NA 142 08/31/2019   K 3.9 08/31/2019   CL 102 08/31/2019   CO2 28 08/31/2019   GLUCOSE 82 08/31/2019   BUN 15 08/31/2019   CREATININE 0.92 (H) 08/31/2019   BILITOT 0.6 08/31/2019   ALKPHOS 121 05/21/2019   AST 34 09/16/2019   ALT 42 (H) 09/16/2019   PROT 6.7 08/31/2019   PROT 6.7 08/31/2019   ALBUMIN 4.1 05/21/2019   CALCIUM 9.9 08/31/2019   GFRAA 67  08/31/2019   QFTBGOLDPLUS NEGATIVE 08/31/2019    Speciality Comments: PLQ eye exam: 08/21/18 normal. Dr. Katy Fitch. Follow up in 1 year.  Procedures:  No procedures performed Allergies: Oxycodone, Hydrocodone, Percocet [oxycodone-acetaminophen], Soma [carisoprodol], and Ultram [tramadol hcl]   Assessment / Plan:     Visit Diagnoses: No diagnosis found.  Orders: No orders of the defined types were placed in this encounter.  No orders of the defined types were placed in this encounter.   Face-to-face time spent with patient was *** minutes. Greater than 50% of time was spent in counseling and coordination of care.  Follow-Up Instructions: No follow-ups on file.   Earnestine Mealing, CMA  Note - This record has been  created using Editor, commissioning.  Chart creation errors have been sought, but may not always  have been located. Such creation errors do not reflect on  the standard of medical care.

## 2019-10-20 ENCOUNTER — Telehealth: Payer: Self-pay

## 2019-10-20 NOTE — Telephone Encounter (Signed)
FYI:  Patient called stating she had labwork at her appointment with Dr. Kathryne Eriksson, PCP and will have the results faxed to Dr. Estanislado Pandy.

## 2019-10-20 NOTE — Telephone Encounter (Signed)
Noted. Will document in the chart once received.

## 2019-10-27 ENCOUNTER — Ambulatory Visit: Payer: Medicare Other | Admitting: Rheumatology

## 2019-10-28 ENCOUNTER — Telehealth: Payer: Self-pay

## 2019-10-28 NOTE — Telephone Encounter (Signed)
Labs in [place].   Patient advised she should not need any additional labs prior to her appointment. Patient expressed understanding.

## 2019-10-28 NOTE — Telephone Encounter (Signed)
Patient called stating she had labwork at Dr. Josph Macho Wilson's office on 10/05/19.  Patient states the results are in Epic through [facility].  Patient is requesting a return call to let her know if Dr. Estanislado Pandy needs additional labwork for her appointment in November.

## 2019-11-24 NOTE — Progress Notes (Signed)
Office Visit Note  Patient: Tara Cox             Date of Birth: [birth year]           MRN: [id number]             PCP: [name], MD Referring: [name], MD Visit Date: 12/07/2019 Occupation: @GUAROCC @  Subjective:  Other (trigger finger right 4th digit. patient would also like to discuss visco )   History of Present Illness: Tara Cox is a 85 y.o. female with history of rheumatoid arthritis, osteoarthritis and pseudogout.  She states she has been taking colchicine on as needed basis.  She has been off Plaquenil since August 31, 2019 due to macular changes seen by Dr. Katy Fitch.  We discussed starting on other medications but she wanted to wait.  She has not noticed any increased joint swelling since then.  She states she has been having some discomfort in her right hand and also bilateral knee joints and lower back.  She has been using topical Voltaren gel in those areas.  She reports pseudogout flare off and on for which she uses colchicine..  Activities of Daily Living:  Patient reports morning stiffness for several minutes.   Patient Denies nocturnal pain.  Difficulty dressing/grooming: Reports Difficulty climbing stairs: Denies Difficulty getting out of chair: Denies Difficulty using hands for taps, buttons, cutlery, and/or writing: Reports  Review of Systems  Constitutional: Negative for fatigue.  HENT: Positive for sore tongue. Negative for mouth sores, mouth dryness and nose dryness.   Eyes: Negative for pain, itching and dryness.  Respiratory: Positive for shortness of breath. Negative for difficulty breathing.   Cardiovascular: Negative for chest pain and palpitations.  Gastrointestinal: Negative for blood in stool, constipation and diarrhea.  Endocrine: Negative for increased urination.  Genitourinary: Negative for difficulty urinating.  Musculoskeletal: Positive for arthralgias, joint pain, morning stiffness and muscle tenderness.  Negative for joint swelling, myalgias and myalgias.  Skin: Negative for color change, rash and redness.  Allergic/Immunologic: Negative for susceptible to infections.  Neurological: Positive for numbness. Negative for dizziness, headaches, memory loss and weakness.  Hematological: Positive for bruising/bleeding tendency.  Psychiatric/Behavioral: Positive for sleep disturbance. Negative for confusion.    PMFS History:  Patient Active Problem List   Diagnosis Date Noted  . Aortic atherosclerosis ([place]) 04/01/2018  . Chronic deep vein thrombosis (DVT) of left upper extremity ([place]) 01/17/2018  . Rheumatoid arthritis of multiple sites with negative rheumatoid factor ([place]) 11/13/2017  . Port-A-Cath in place 07/24/2017  . Breast cancer, stage 2, right ([place]) 05/07/2017  . Malignant neoplasm of central portion of right breast in female, estrogen receptor positive ([place]) 04/02/2017  . Primary insomnia 09/27/2016  . ANA positive 09/27/2016  . Complete tear of right rotator cuff 05/11/2016  . High risk medication use 05/07/2016  . Uterine fibroid 12/08/2015  . History of miscarriage 12/08/2015  . Esophagitis 12/08/2015  . Diaphragmatic hernia 12/08/2015  . Calcium pyrophosphate deposition disease 12/08/2015  . Osteoarthritis of both hands 12/08/2015  . Osteoarthritis of both feet 12/08/2015  . History of total right knee replacement 12/08/2015  . Chondromalacia of both patellae 12/08/2015  . DDD (degenerative disc disease), lumbar 12/08/2015  . Essential hypertension 06/26/2013  . Paroxysmal atrial fibrillation ([place]) 06/26/2013  . Hyperlipidemia 06/26/2013  . DJD (degenerative joint disease) of knee 01/26/2013  . Hemorrhoids 07/11/2010  . IBS (irritable bowel syndrome) 07/11/2010  . Fatigue/loss of sleep 07/11/2010  . Night sweats 07/11/2010  .  Chills 07/11/2010  . Weight gain 07/11/2010  . Inflammatory arthritis 07/11/2010  . Incontinence of urine 07/11/2010  . Thrombosed external  hemorrhoids 07/11/2010    Past Medical History:  Diagnosis Date  . A-fib ([place])   . Arthritis   . Breast cancer ([place]) 05/07/2017   Right Breast Cancer  . Cancer ([place])   . 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 DDD (degenerative disc disease), lumbar 12/08/2015   S/P discectomy   . Diaphragmatic hernia 12/08/2015  . Dysrhythmia    A-Fib  . Edema of lower extremity 06/29/09   Lower Venous Exam- normal. No evidence of thrombus or thrombophlebitis.  . Esophagitis 12/08/2015  . Frequent urination at night   . GERD (gastroesophageal reflux disease)   . Hemorrhoids   . History of calcium pyrophosphate deposition disease (CPPD) 12/08/2015  . History of hiatal hernia   . History of miscarriage 12/08/2015   x3  . History of total right knee replacement 12/08/2015  . Hypertension 02/24/09   Echo-EF 62%; Myocardial Perfusion Study-Normal. No signifcant ischemia noted.  . IBS (irritable bowel syndrome)   . Migraine   . Osteoarthritis of both feet 12/08/2015  . Osteoarthritis of both hands 12/08/2015  . Personal history of chemotherapy 2019   Right Breast Cancer  . Personal history of radiation therapy 2019   Right Breast Cancer  . PONV (postoperative nausea and vomiting)   . Uterine fibroid 12/08/2015    Family History  Problem Relation Age of Onset  . Other Mother   . Varicose Veins Mother   . Emphysema Mother   . Heart disease Father        heart attack  . Diabetes Sister   . Other Sister        pacemaker  . Diabetes Sister   . Heart disease Sister    Past Surgical History:  Procedure Laterality Date  . ABDOMINAL HYSTERECTOMY  1974  . APPENDECTOMY    . BACK SURGERY  2001  . BUNIONECTOMY  1986  . CESAREAN SECTION  1971  . COLONOSCOPY    . COLONOSCOPY WITH PROPOFOL N/A 05/28/2014   Procedure: COLONOSCOPY WITH PROPOFOL;  Surgeon: Carol Ada, MD;  [facility];  Service: Endoscopy;  Laterality: N/A;  . ESOPHAGOGASTRODUODENOSCOPY (EGD) WITH PROPOFOL N/A 05/28/2014     Procedure: ESOPHAGOGASTRODUODENOSCOPY (EGD) WITH PROPOFOL;  Surgeon: Carol Ada, MD;  [facility];  Service: Endoscopy;  Laterality: N/A;  . EXPLORATORY LAPAROTOMY     open procedure  . EYE SURGERY Bilateral    cataract removal  . MASTECTOMY Left 05/07/2017  . PORT-A-CATH REMOVAL Right 07/17/2018   Procedure: PORT REMOVAL;  Surgeon: Erroll Luna, MD;  Location: [facility];  Service: General;  Laterality: Right;  . PORTACATH PLACEMENT Right 05/07/2017   Procedure: INSERTION PORT-A-CATH;  Surgeon: Erroll Luna, MD;  Location: [facility];  Service: General;  Laterality: Right;  . SHOULDER SURGERY Right   . SIMPLE MASTECTOMY WITH AXILLARY SENTINEL NODE BIOPSY Right 05/07/2017   Procedure: RIGHT SIMPLE MASTECTOMY WITH AXILLARY SENTINEL NODE BIOPSY;  Surgeon: Erroll Luna, MD;  Location: [facility];  Service: General;  Laterality: Right;  . TOTAL KNEE ARTHROPLASTY Right 01/26/2013   DR Ronnie Derby  . TOTAL KNEE ARTHROPLASTY Right 01/26/2013   Procedure: TOTAL KNEE ARTHROPLASTY;  Surgeon: Vickey Huger, MD;  Location: [facility];  Service: Orthopedics;  Laterality: Right;  . [place]   Social History   Social History Narrative   [phone] Unable to ask abuse questions husband  with her today.   Immunization History  Administered Date(s) Administered  . Influenza, High Dose Seasonal PF 10/07/2015, 10/19/2016, 10/25/2017  . PFIZER SARS-COV-2 Vaccination 01/25/2019, 02/13/2019, 09/15/2019  . Pneumococcal Conjugate-13 11/24/2013  . Pneumococcal Polysaccharide-23 01/10/2003  . Tdap 03/21/2017, 03/21/2017  . Zoster 08/01/2012  . Zoster Recombinat (Shingrix) 03/21/2017, 03/21/2017     Objective: Vital Signs: BP 135/84 (BP Location: Left Arm, Patient Position: Sitting, Cuff Size: Normal)   Pulse 65   Resp 17   Ht 5' 7.5" (1.715 m)   Wt 239 lb 9.6 oz (108.7 kg)   BMI 36.97 kg/m    Physical Exam Vitals and nursing note reviewed.  Constitutional:       Appearance: She is well-developed.  HENT:     Head: Normocephalic and atraumatic.  Eyes:     Conjunctiva/sclera: Conjunctivae normal.  Cardiovascular:     Rate and Rhythm: Normal rate and regular rhythm.     Heart sounds: Normal heart sounds.  Pulmonary:     Effort: Pulmonary effort is normal.     Breath sounds: Normal breath sounds.  Abdominal:     General: Bowel sounds are normal.     Palpations: Abdomen is soft.  Musculoskeletal:     Cervical back: Normal range of motion.  Lymphadenopathy:     Cervical: No cervical adenopathy.  Skin:    General: Skin is warm and dry.     Capillary Refill: Capillary refill takes less than 2 seconds.  Neurological:     Mental Status: She is alert and oriented to person, place, and time.  Psychiatric:        Behavior: Behavior normal.      Musculoskeletal Exam: She has stiffness with range of motion of her cervical spine.  Shoulder joints are in good range of motion.  She has some discomfort range of motion of her left shoulder joint.  Elbow joints, wrist joints, MCPs, PIPs and DIPs with good range of motion with no synovitis.  She had left trigger ring finger.  Hip joints with good range of motion.  She has some discomfort range of motion of her left knee joint without any warmth swelling or effusion.  She had no tenderness over ankles or MTPs or PIPs.  CDAI Exam: CDAI Score: 0.8  Patient Global: 4 mm; Provider Global: 4 mm Swollen: 0 ; Tender: 0  Joint Exam 12/07/2019   No joint exam has been documented for this visit   There is currently no information documented on the homunculus. Go to the Rheumatology activity and complete the homunculus joint exam.  Investigation: No additional findings.  Imaging: No results found.  Recent Labs: Lab Results  Component Value Date   WBC 4.5 08/31/2019   HGB 13.5 08/31/2019   PLT 150 08/31/2019   NA 142 08/31/2019   K 3.9 08/31/2019   CL 102 08/31/2019   CO2 28 08/31/2019   GLUCOSE 82  08/31/2019   BUN 15 08/31/2019   CREATININE 0.92 (H) 08/31/2019   BILITOT 0.6 08/31/2019   ALKPHOS 121 05/21/2019   AST 34 09/16/2019   ALT 42 (H) 09/16/2019   PROT 6.7 08/31/2019   PROT 6.7 08/31/2019   ALBUMIN 4.1 05/21/2019   CALCIUM 9.9 08/31/2019   GFRAA 67 08/31/2019   QFTBGOLDPLUS NEGATIVE 08/31/2019    Speciality Comments: PLQ eye exam: 08/21/18 normal. Dr. Katy Fitch. Follow up in 1 year.  Procedures:  Hand/UE Inj: R ring A1 for trigger finger on 12/07/2019 12:20 PM Indications: pain, tendon swelling and  therapeutic Details: 27 G needle, ultrasound-guided volar approach Medications: 0.5 mL lidocaine 1 %; 10 mg triamcinolone acetonide 40 MG/ML Aspirate: 0 mL Procedure, treatment alternatives, risks and benefits explained, specific risks discussed. Immediately prior to procedure a time out was called to verify the correct patient, procedure, equipment, support staff and site/side marked as required. Patient was prepped and draped in the usual sterile fashion.     Allergies: Oxycodone, Hydrocodone, Percocet [oxycodone-acetaminophen], Soma [carisoprodol], and Ultram [tramadol hcl]   Assessment / Plan:     Visit Diagnoses: Rheumatoid arthritis of multiple sites with negative rheumatoid factor (HCC)-patient has been off Plaquenil since August due to macular changes.  She had no synovitis on my examination.  She states denies any joint swelling.  She has been having some discomfort in the left shoulder and left knee joint.  I do not see a need for  DMARDs at this point.  She will contact me if she develops new symptoms.  Trigger finger, right ring finger-this finger has been bothersome for her.  She requests cortisone injection.  We discussed ultrasound-guided cortisone injection.  Patient tolerated the procedure well.  Postprocedure instructions were given and a splint was applied.  Calcium pyrophosphate deposition disease-she reports infrequent flares of pseudogout.  She takes  colchicine on as needed basis.  Primary osteoarthritis of both hands-joint protection muscle strengthening was discussed.  Primary osteoarthritis of both feet-she has discomfort off and on.  Plantar fasciitis, bilateral-resolved.  History of total right knee replacement-doing well.  DDD (degenerative disc disease), lumbar-she has chronic lower back pain.  Osteopenia of multiple sites-she is on calcium and vitamin D.  Other medical problems are listed as follows:  Paroxysmal atrial fibrillation ([place])  Pure hypercholesterolemia  History of DVT (deep vein thrombosis)  Essential hypertension  Malignant neoplasm of central portion of right breast in female, estrogen receptor positive (HCC)  BMI 37.0-37.9, adult  Orders: No orders of the defined types were placed in this encounter.  No orders of the defined types were placed in this encounter.    Follow-Up Instructions: Return in about 3 months (around 03/07/2020) for Rheumatoid arthritis, Osteoarthritis.   Bo Merino, MD  Note - This record has been created using Editor, commissioning.  Chart creation errors have been sought, but may not always  have been located. Such creation errors do not reflect on  the standard of medical care.

## 2019-12-07 ENCOUNTER — Other Ambulatory Visit: Payer: Self-pay

## 2019-12-07 ENCOUNTER — Ambulatory Visit: Payer: Self-pay

## 2019-12-07 ENCOUNTER — Encounter: Payer: Self-pay | Admitting: Rheumatology

## 2019-12-07 ENCOUNTER — Ambulatory Visit: Payer: Medicare Other | Admitting: Rheumatology

## 2019-12-07 VITALS — BP 135/84 | HR 65 | Resp 17 | Ht 67.5 in | Wt 239.6 lb

## 2019-12-07 DIAGNOSIS — M19071 Primary osteoarthritis, right ankle and foot: Secondary | ICD-10-CM

## 2019-12-07 DIAGNOSIS — M5136 Other intervertebral disc degeneration, lumbar region: Secondary | ICD-10-CM

## 2019-12-07 DIAGNOSIS — Z6837 Body mass index (BMI) 37.0-37.9, adult: Secondary | ICD-10-CM

## 2019-12-07 DIAGNOSIS — M19042 Primary osteoarthritis, left hand: Secondary | ICD-10-CM

## 2019-12-07 DIAGNOSIS — C50111 Malignant neoplasm of central portion of right female breast: Secondary | ICD-10-CM

## 2019-12-07 DIAGNOSIS — M19041 Primary osteoarthritis, right hand: Secondary | ICD-10-CM | POA: Diagnosis not present

## 2019-12-07 DIAGNOSIS — M65341 Trigger finger, right ring finger: Secondary | ICD-10-CM

## 2019-12-07 DIAGNOSIS — M722 Plantar fascial fibromatosis: Secondary | ICD-10-CM

## 2019-12-07 DIAGNOSIS — Z170 Estrogen receptor positive status [ER+]: Secondary | ICD-10-CM

## 2019-12-07 DIAGNOSIS — Z86718 Personal history of other venous thrombosis and embolism: Secondary | ICD-10-CM

## 2019-12-07 DIAGNOSIS — M0609 Rheumatoid arthritis without rheumatoid factor, multiple sites: Secondary | ICD-10-CM | POA: Diagnosis not present

## 2019-12-07 DIAGNOSIS — Z79899 Other long term (current) drug therapy: Secondary | ICD-10-CM

## 2019-12-07 DIAGNOSIS — M1120 Other chondrocalcinosis, unspecified site: Secondary | ICD-10-CM

## 2019-12-07 DIAGNOSIS — E7800 Pure hypercholesterolemia, unspecified: Secondary | ICD-10-CM

## 2019-12-07 DIAGNOSIS — M8589 Other specified disorders of bone density and structure, multiple sites: Secondary | ICD-10-CM

## 2019-12-07 DIAGNOSIS — M51369 Other intervertebral disc degeneration, lumbar region without mention of lumbar back pain or lower extremity pain: Secondary | ICD-10-CM

## 2019-12-07 DIAGNOSIS — Z96651 Presence of right artificial knee joint: Secondary | ICD-10-CM

## 2019-12-07 DIAGNOSIS — I480 Paroxysmal atrial fibrillation: Secondary | ICD-10-CM

## 2019-12-07 DIAGNOSIS — M19072 Primary osteoarthritis, left ankle and foot: Secondary | ICD-10-CM

## 2019-12-07 DIAGNOSIS — I10 Essential (primary) hypertension: Secondary | ICD-10-CM

## 2019-12-07 MED ORDER — TRIAMCINOLONE ACETONIDE 40 MG/ML IJ SUSP
10.0000 mg | INTRAMUSCULAR | Status: AC | PRN
  Administered 2019-12-07: 10 mg

## 2019-12-07 MED ORDER — LIDOCAINE HCL 1 % IJ SOLN
0.5000 mL | INTRAMUSCULAR | Status: AC | PRN
  Administered 2019-12-07: .5 mL

## 2019-12-08 ENCOUNTER — Telehealth: Payer: Self-pay

## 2019-12-08 NOTE — Telephone Encounter (Signed)
Patient called stating she had injection in her right hand trigger finger yesterday and is still experiencing a lot of pain.  Patient states she had difficulty sleeping last night due to throbbing pain.  Patient is requesting a return call to let her know if she should use ice or heat.  Patient states she cannot take Tylenol because of her liver enzymes.

## 2019-12-08 NOTE — Telephone Encounter (Signed)
Advised patient to ice the area.  I also advised her to take Tylenol 500 mg 1 tablet tonight if needed.  Her liver functions only mildly elevated.  She will notify me if she has persistent symptoms.

## 2019-12-17 ENCOUNTER — Other Ambulatory Visit: Payer: Self-pay | Admitting: Adult Health

## 2019-12-19 NOTE — Telephone Encounter (Signed)
Hey Dr. Jana Hakim,  You saw Tara Cox last in 05/2019.  I am unsure how long she needs to continue this.  Will you refill if appropriate?  Thanks,  [name]

## 2020-02-22 NOTE — Progress Notes (Deleted)
Office Visit Note  Patient: Tara Cox             Date of Birth: Dec 28, 1934           MRN: [id number]             PCP: [name], MD Referring: [name], MD Visit Date: 03/07/2020 Occupation: @GUAROCC @  Subjective:  No chief complaint on file.   History of Present Illness: Tara Cox is a 86 y.o. female ***   Activities of Daily Living:  Patient reports morning stiffness for *** {minute/hour:19697}.   Patient {ACTIONS;DENIES/REPORTS:21021675::"Denies"} nocturnal pain.  Difficulty dressing/grooming: {ACTIONS;DENIES/REPORTS:21021675::"Denies"} Difficulty climbing stairs: {ACTIONS;DENIES/REPORTS:21021675::"Denies"} Difficulty getting out of chair: {ACTIONS;DENIES/REPORTS:21021675::"Denies"} Difficulty using hands for taps, buttons, cutlery, and/or writing: {ACTIONS;DENIES/REPORTS:21021675::"Denies"}  No Rheumatology ROS completed.   PMFS History:  Patient Active Problem List   Diagnosis Date Noted  . Aortic atherosclerosis ([place]) 04/01/2018  . Chronic deep vein thrombosis (DVT) of left upper extremity ([place]) 01/17/2018  . Rheumatoid arthritis of multiple sites with negative rheumatoid factor ([place]) 11/13/2017  . Port-A-Cath in place 07/24/2017  . Breast cancer, stage 2, right ([place]) 05/07/2017  . Malignant neoplasm of central portion of right breast in female, estrogen receptor positive ([place]) 04/02/2017  . Primary insomnia 09/27/2016  . ANA positive 09/27/2016  . Complete tear of right rotator cuff 05/11/2016  . High risk medication use 05/07/2016  . Uterine fibroid 12/08/2015  . History of miscarriage 12/08/2015  . Esophagitis 12/08/2015  . Diaphragmatic hernia 12/08/2015  . Calcium pyrophosphate deposition disease 12/08/2015  . Osteoarthritis of both hands 12/08/2015  . Osteoarthritis of both feet 12/08/2015  . History of total right knee replacement 12/08/2015  . Chondromalacia of both patellae 12/08/2015  . DDD (degenerative disc  disease), lumbar 12/08/2015  . Essential hypertension 06/26/2013  . Paroxysmal atrial fibrillation ([place]) 06/26/2013  . Hyperlipidemia 06/26/2013  . DJD (degenerative joint disease) of knee 01/26/2013  . Hemorrhoids 07/11/2010  . IBS (irritable bowel syndrome) 07/11/2010  . Fatigue/loss of sleep 07/11/2010  . Night sweats 07/11/2010  . Chills 07/11/2010  . Weight gain 07/11/2010  . Inflammatory arthritis 07/11/2010  . Incontinence of urine 07/11/2010  . Thrombosed external hemorrhoids 07/11/2010    Past Medical History:  Diagnosis Date  . A-fib ([place])   . Arthritis   . Breast cancer ([place]) 05/07/2017   Right Breast Cancer  . Cancer ([place])   . 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 DDD (degenerative disc disease), lumbar 12/08/2015   S/P discectomy   . Diaphragmatic hernia 12/08/2015  . Dysrhythmia    A-Fib  . Edema of lower extremity 06/29/09   Lower Venous Exam- normal. No evidence of thrombus or thrombophlebitis.  . Esophagitis 12/08/2015  . Frequent urination at night   . GERD (gastroesophageal reflux disease)   . Hemorrhoids   . History of calcium pyrophosphate deposition disease (CPPD) 12/08/2015  . History of hiatal hernia   . History of miscarriage 12/08/2015   x3  . History of total right knee replacement 12/08/2015  . Hypertension 02/24/09   Echo-EF 62%; Myocardial Perfusion Study-Normal. No signifcant ischemia noted.  . IBS (irritable bowel syndrome)   . Migraine   . Osteoarthritis of both feet 12/08/2015  . Osteoarthritis of both hands 12/08/2015  . Personal history of chemotherapy 2019   Right Breast Cancer  . Personal history of radiation therapy 2019   Right Breast Cancer  . PONV (postoperative nausea and vomiting)   . Uterine fibroid 12/08/2015  Family History  Problem Relation Age of Onset  . Other Mother   . Varicose Veins Mother   . Emphysema Mother   . Heart disease Father        heart attack  . Diabetes Sister   . Other Sister         pacemaker  . Diabetes Sister   . Heart disease Sister    Past Surgical History:  Procedure Laterality Date  . ABDOMINAL HYSTERECTOMY  1974  . APPENDECTOMY    . BACK SURGERY  2001  . BUNIONECTOMY  1986  . CESAREAN SECTION  1971  . COLONOSCOPY    . COLONOSCOPY WITH PROPOFOL N/A 05/28/2014   Procedure: COLONOSCOPY WITH PROPOFOL;  Surgeon: Carol Ada, MD;  Location: [facility];  Service: Endoscopy;  Laterality: N/A;  . ESOPHAGOGASTRODUODENOSCOPY (EGD) WITH PROPOFOL N/A 05/28/2014   Procedure: ESOPHAGOGASTRODUODENOSCOPY (EGD) WITH PROPOFOL;  Surgeon: Carol Ada, MD;  Location: [facility];  Service: Endoscopy;  Laterality: N/A;  . EXPLORATORY LAPAROTOMY     open procedure  . EYE SURGERY Bilateral    cataract removal  . MASTECTOMY Left 05/07/2017  . PORT-A-CATH REMOVAL Right 07/17/2018   Procedure: PORT REMOVAL;  Surgeon: Erroll Luna, MD;  Location: [facility];  Service: General;  Laterality: Right;  . PORTACATH PLACEMENT Right 05/07/2017   Procedure: INSERTION PORT-A-CATH;  Surgeon: Erroll Luna, MD;  Location: [facility];  Service: General;  Laterality: Right;  . SHOULDER SURGERY Right   . SIMPLE MASTECTOMY WITH AXILLARY SENTINEL NODE BIOPSY Right 05/07/2017   Procedure: RIGHT SIMPLE MASTECTOMY WITH AXILLARY SENTINEL NODE BIOPSY;  Surgeon: Erroll Luna, MD;  Location: [facility];  Service: General;  Laterality: Right;  . TOTAL KNEE ARTHROPLASTY Right 01/26/2013   DR Ronnie Derby  . TOTAL KNEE ARTHROPLASTY Right 01/26/2013   Procedure: TOTAL KNEE ARTHROPLASTY;  Surgeon: Vickey Huger, MD;  Location: [facility];  Service: Orthopedics;  Laterality: Right;  . [place]   Social History   Social History Narrative   [phone] Unable to ask abuse questions husband with her today.   Immunization History  Administered Date(s) Administered  . Influenza, High Dose Seasonal PF 10/07/2015, 10/19/2016, 10/25/2017  . PFIZER(Purple Top)SARS-COV-2 Vaccination 01/25/2019,  02/13/2019, 09/15/2019  . Pneumococcal Conjugate-13 11/24/2013  . Pneumococcal Polysaccharide-23 01/10/2003  . Tdap 03/21/2017, 03/21/2017  . Zoster 08/01/2012  . Zoster Recombinat (Shingrix) 03/21/2017, 03/21/2017     Objective: Vital Signs: There were no vitals taken for this visit.   Physical Exam   Musculoskeletal Exam: ***  CDAI Exam: CDAI Score: - Patient Global: -; Provider Global: - Swollen: -; Tender: - Joint Exam 03/07/2020   No joint exam has been documented for this visit   There is currently no information documented on the homunculus. Go to the Rheumatology activity and complete the homunculus joint exam.  Investigation: No additional findings.  Imaging: No results found.  Recent Labs: Lab Results  Component Value Date   WBC 4.5 08/31/2019   HGB 13.5 08/31/2019   PLT 150 08/31/2019   NA 142 08/31/2019   K 3.9 08/31/2019   CL 102 08/31/2019   CO2 28 08/31/2019   GLUCOSE 82 08/31/2019   BUN 15 08/31/2019   CREATININE 0.92 (H) 08/31/2019   BILITOT 0.6 08/31/2019   ALKPHOS 121 05/21/2019   AST 34 09/16/2019   ALT 42 (H) 09/16/2019   PROT 6.7 08/31/2019   PROT 6.7 08/31/2019   ALBUMIN 4.1 05/21/2019   CALCIUM 9.9 08/31/2019   GFRAA 67  08/31/2019   QFTBGOLDPLUS NEGATIVE 08/31/2019    Speciality Comments: PLQ eye exam: 08/21/18 normal. Dr. Katy Fitch. Follow up in 1 year.  Procedures:  No procedures performed Allergies: Oxycodone, Hydrocodone, Percocet [oxycodone-acetaminophen], Soma [carisoprodol], and Ultram [tramadol hcl]   Assessment / Plan:     Visit Diagnoses: Rheumatoid arthritis of multiple sites with negative rheumatoid factor (HCC)  Trigger finger, right ring finger  Calcium pyrophosphate deposition disease  Primary osteoarthritis of both hands  Primary osteoarthritis of both feet  Plantar fasciitis, bilateral  History of total right knee replacement  DDD (degenerative disc disease), lumbar  Osteopenia of multiple  sites  Paroxysmal atrial fibrillation ([place])  Pure hypercholesterolemia  History of DVT (deep vein thrombosis)  Essential hypertension  Malignant neoplasm of central portion of right breast in female, estrogen receptor positive (HCC)  BMI 37.0-37.9, adult  Orders: No orders of the defined types were placed in this encounter.  No orders of the defined types were placed in this encounter.   Face-to-face time spent with patient was *** minutes. Greater than 50% of time was spent in counseling and coordination of care.  Follow-Up Instructions: No follow-ups on file.   Ofilia Neas, PA-C  Note - This record has been created using Dragon software.  Chart creation errors have been sought, but may not always  have been located. Such creation errors do not reflect on  the standard of medical care.

## 2020-02-26 ENCOUNTER — Other Ambulatory Visit: Payer: Self-pay | Admitting: Oncology

## 2020-02-26 DIAGNOSIS — Z0000 Encounter for general adult medical examination without abnormal findings: Secondary | ICD-10-CM

## 2020-03-07 ENCOUNTER — Ambulatory Visit: Payer: Medicare Other | Admitting: Physician Assistant

## 2020-03-07 DIAGNOSIS — M0609 Rheumatoid arthritis without rheumatoid factor, multiple sites: Secondary | ICD-10-CM

## 2020-03-07 DIAGNOSIS — M19071 Primary osteoarthritis, right ankle and foot: Secondary | ICD-10-CM

## 2020-03-07 DIAGNOSIS — M19041 Primary osteoarthritis, right hand: Secondary | ICD-10-CM

## 2020-03-07 DIAGNOSIS — M8589 Other specified disorders of bone density and structure, multiple sites: Secondary | ICD-10-CM

## 2020-03-07 DIAGNOSIS — I480 Paroxysmal atrial fibrillation: Secondary | ICD-10-CM

## 2020-03-07 DIAGNOSIS — Z96651 Presence of right artificial knee joint: Secondary | ICD-10-CM

## 2020-03-07 DIAGNOSIS — Z86718 Personal history of other venous thrombosis and embolism: Secondary | ICD-10-CM

## 2020-03-07 DIAGNOSIS — E7800 Pure hypercholesterolemia, unspecified: Secondary | ICD-10-CM

## 2020-03-07 DIAGNOSIS — M5136 Other intervertebral disc degeneration, lumbar region: Secondary | ICD-10-CM

## 2020-03-07 DIAGNOSIS — M65341 Trigger finger, right ring finger: Secondary | ICD-10-CM

## 2020-03-07 DIAGNOSIS — I10 Essential (primary) hypertension: Secondary | ICD-10-CM

## 2020-03-07 DIAGNOSIS — Z6837 Body mass index (BMI) 37.0-37.9, adult: Secondary | ICD-10-CM

## 2020-03-07 DIAGNOSIS — M1120 Other chondrocalcinosis, unspecified site: Secondary | ICD-10-CM

## 2020-03-07 DIAGNOSIS — M722 Plantar fascial fibromatosis: Secondary | ICD-10-CM

## 2020-03-07 DIAGNOSIS — C50111 Malignant neoplasm of central portion of right female breast: Secondary | ICD-10-CM

## 2020-04-13 ENCOUNTER — Ambulatory Visit: Payer: Medicare Other | Attending: Internal Medicine

## 2020-04-13 DIAGNOSIS — Z23 Encounter for immunization: Secondary | ICD-10-CM

## 2020-04-13 NOTE — Progress Notes (Signed)
   Covid-19 Vaccination Clinic  Name:  Tara Cox    MRN: [id number] DOB: Jan 15, 1934  04/13/2020  [name] was observed post Covid-19 immunization for 15 minutes without incident. She was provided with Vaccine Information Sheet and instruction to access the V-Safe system.   [name] was instructed to call 911 with any severe reactions post vaccine: Marland Kitchen Difficulty breathing  . Swelling of face and throat  . A fast heartbeat  . A bad rash all over body  . Dizziness and weakness   Immunizations Administered    Name Date Dose VIS Date Route   PFIZER Comrnaty(Gray TOP) Covid-19 Vaccine 04/13/2020 10:01 AM 0.3 mL 12/17/2019 Intramuscular   Manufacturer: [employer], [employer]   Lot: [id number]   [place]: [phone]

## 2020-04-18 ENCOUNTER — Ambulatory Visit: Payer: Medicare Other

## 2020-04-19 NOTE — Progress Notes (Signed)
[place]  [phone]) [phone] Fax:(336) [phone]    ID: Tara Cox DOB: 16-Sep-1934  MR#: [id number]  YOK#:[id number]  Patient Care Team: [name], MD as PCP - General (Family Medicine) Larey Dresser, MD as PCP - Advanced Heart Failure (Cardiology) Lindsea Olivar, Virgie Dad, MD as Consulting Physician (Oncology) Erroll Luna, MD as Consulting Physician (General Surgery) Kyung Rudd, MD as Consulting Physician (Radiation Oncology) Vickey Huger, MD as Consulting Physician (Orthopedic Surgery) Bo Merino, MD as Consulting Physician (Rheumatology) Lorretta Harp, MD as Consulting Physician (Cardiology) OTHER MD:   CHIEF COMPLAINT: Triple positive breast cancer (s/p right mastectomy)  CURRENT TREATMENT: Anastrozole, rivaroxaban   INTERVAL HISTORY: Diani returns today for follow-up of her triple positive breast cancer.   She continues on anastrozole.  She generally tolerates this well, without unusual hot flashes or vaginal dryness issues.  She thinks her hair is thinning some although this is not clinically apparent.  She is scheduled for screening mammography on 06/03/2020.   REVIEW OF SYSTEMS: Carlina does not go to the gym anymore but she has gotten something like a Cubix for exercise, she is starting to do some gardening, and she also does chair Zumba through her church on Saturdays.  She is not having any bleeding or bruising issues related to the Xarelto.  There have been no intercurrent falls.  She had a trigger finger which was treated by Dr. Inez Pilgrim with a steroid shot and that is now much more flexible.  She is doing stretching on the right side where she still has soreness sensitivity occasional shooting pains and discomfort from the mastectomy.  Aside from these issues a detailed review of systems today was stable   COVID 19 VACCINATION STATUS: fully vaccinated [employer]), with 4th dose 04/13/2020   HISTORY OF CURRENT  ILLNESS: From the original intake note:  Taaliyah Delpriore had mammography at the breast center October 11, 2011 showing calcifications in the upper right breast.  Six-month follow-up was suggested, but  the patient did not follow-up.  More recently she noticed nipple inversion on the right breast and followed this without reporting it over the last several months. She did undergo bilateral diagnostic mammography with tomography and right breast ultrasonography at [facility] on 03/26/2017 showing: breast density category B. There is a highly suspicious right retroareolar mass  measuring approximately 2.3 x 1.5 x 2.8 cm  with associated calcifications, together the mass and calcifications measure up to 3.3 cm mammographically. There are 2 lymph nodes in the right axilla with borderline thickened cortices, 1 of which measures 1 x 0.9 x 0.6 cm with a cortex thickness of 0.4 mm.  Accordingly on 03/29/2017 she proceeded to biopsy of the right breast and one axillary lymph node.. The pathology from this procedure showed ([id number]): Invasive lobular carcinoma,grade II, E-cadherin negative. The right axillary lymph node was negative for carcinoma. Prognostic indicators significant for: estrogen receptor, 100% positive and progesterone receptor, 100% positive, both with strong staining intensity. Proliferation marker Ki67 at 25%. HER2 amplified with ratios  HER2/CEP17 signals 2.22 and average HER2 copies per cell 3.00  The patient's subsequent history is as detailed below.   PAST MEDICAL HISTORY: Past Medical History:  Diagnosis Date  . A-fib ([place])   . Arthritis   . Breast cancer ([place]) 05/07/2017   Right Breast Cancer  . Cancer ([place])   . 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 DDD (degenerative disc disease), lumbar 12/08/2015   S/P discectomy   . Diaphragmatic  hernia 12/08/2015  . Dysrhythmia    A-Fib  . Edema of lower extremity 06/29/09   Lower Venous Exam- normal. No evidence of  thrombus or thrombophlebitis.  . Esophagitis 12/08/2015  . Frequent urination at night   . GERD (gastroesophageal reflux disease)   . Hemorrhoids   . History of calcium pyrophosphate deposition disease (CPPD) 12/08/2015  . History of hiatal hernia   . History of miscarriage 12/08/2015   x3  . History of total right knee replacement 12/08/2015  . Hypertension 02/24/09   Echo-EF 62%; Myocardial Perfusion Study-Normal. No signifcant ischemia noted.  . IBS (irritable bowel syndrome)   . Migraine   . Osteoarthritis of both feet 12/08/2015  . Osteoarthritis of both hands 12/08/2015  . Personal history of chemotherapy 2019   Right Breast Cancer  . Personal history of radiation therapy 2019   Right Breast Cancer  . PONV (postoperative nausea and vomiting)   . Uterine fibroid 12/08/2015    PAST SURGICAL HISTORY: Past Surgical History:  Procedure Laterality Date  . ABDOMINAL HYSTERECTOMY  1974  . APPENDECTOMY    . BACK SURGERY  2001  . BUNIONECTOMY  1986  . CESAREAN SECTION  1971  . COLONOSCOPY    . COLONOSCOPY WITH PROPOFOL N/A 05/28/2014   Procedure: COLONOSCOPY WITH PROPOFOL;  Surgeon: Carol Ada, MD;  Location: [facility];  Service: Endoscopy;  Laterality: N/A;  . ESOPHAGOGASTRODUODENOSCOPY (EGD) WITH PROPOFOL N/A 05/28/2014   Procedure: ESOPHAGOGASTRODUODENOSCOPY (EGD) WITH PROPOFOL;  Surgeon: Carol Ada, MD;  Location: [facility];  Service: Endoscopy;  Laterality: N/A;  . EXPLORATORY LAPAROTOMY     open procedure  . EYE SURGERY Bilateral    cataract removal  . MASTECTOMY Left 05/07/2017  . PORT-A-CATH REMOVAL Right 07/17/2018   Procedure: PORT REMOVAL;  Surgeon: Erroll Luna, MD;  Location: [facility];  Service: General;  Laterality: Right;  . PORTACATH PLACEMENT Right 05/07/2017   Procedure: INSERTION PORT-A-CATH;  Surgeon: Erroll Luna, MD;  Location: [facility];  Service: General;  Laterality: Right;  . SHOULDER SURGERY Right   . SIMPLE MASTECTOMY WITH  AXILLARY SENTINEL NODE BIOPSY Right 05/07/2017   Procedure: RIGHT SIMPLE MASTECTOMY WITH AXILLARY SENTINEL NODE BIOPSY;  Surgeon: Erroll Luna, MD;  Location: [facility];  Service: General;  Laterality: Right;  . TOTAL KNEE ARTHROPLASTY Right 01/26/2013   DR Ronnie Derby  . TOTAL KNEE ARTHROPLASTY Right 01/26/2013   Procedure: TOTAL KNEE ARTHROPLASTY;  Surgeon: Vickey Huger, MD;  Location: [facility];  Service: Orthopedics;  Laterality: Right;  . UTERINE FIBROID SURGERY  1967    FAMILY HISTORY Family History  Problem Relation Age of Onset  . Other Mother   . Varicose Veins Mother   . Emphysema Mother   . Heart disease Father        heart attack  . Diabetes Sister   . Other Sister        pacemaker  . Diabetes Sister   . Heart disease Sister   The patient's father died in 38 (around age 31) in [place] due to sudden death. The patient's mother died around age 45 due to emphysema. The patient was born in [place], [place]. The patient has 2 brothers and 3 sisters. One sister had a lumpectomy in 1991, but this was not malignant. Another sister died of pancreatic cancer. The patient denies a family history of breast or ovarian cancer.     GYNECOLOGIC HISTORY:  No LMP recorded. Patient has had a hysterectomy. Menarche: 86 years old Age at  first live birth: 86 years old She is GXP1.  Her LMP was at age 19 when she underwent hysterectomy, without salpingo-oophorectomy. She was on Premarin for more than 20 years.  She never used oral contraceptives.   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 Her husband, Broadus John, used to be in banking, and he is currently retired. The patient's biological daughter, Clarene Critchley, is a Health and safety inspector at a contact center in [place]. The patient's adopted daughter, Lenna Sciara, is an Social research officer, government in [place]. The patient's adopted son, Gaspar Bidding, is unemployed in [place]. The patient has 8 grandchildren, one of whom works as a  Pharmacist, community.  The patient belongs to [employer].    ADVANCED DIRECTIVES: In the absence of any documentation to the contrary, the patient's spouse is their HCPOA.    HEALTH MAINTENANCE: Social History   Tobacco Use  . Smoking status: Former Smoker    Packs/day: 1.00    Years: 20.00    Pack years: 20.00    Types: Cigarettes    Quit date: 02/19/1990    Years since quitting: 30.1  . Smokeless tobacco: Never Used  . Tobacco comment: ' i QUIT SMOKING MANY YEARS AGO "  Vaping Use  . Vaping Use: Never used  Substance Use Topics  . Alcohol use: No  . Drug use: Never    Colonoscopy: Dr. Benson Norway 2016  PAP:  Bone density:   Allergies  Allergen Reactions  . Oxycodone Itching, Nausea And Vomiting and Other (See Comments)    Full body weakness   . Hydrocodone Other (See Comments)    Reports they make her feel sick/drowsy Can take in small amounts  . Percocet [Oxycodone-Acetaminophen] Itching    Pt says she can take tylenol   . Soma [Carisoprodol] Nausea And Vomiting  . Ultram [Tramadol Hcl] Nausea And Vomiting    Current Outpatient Medications  Medication Sig Dispense Refill  . acetaminophen (TYLENOL) 650 MG CR tablet Take 650 mg by mouth every 8 (eight) hours as needed for pain.    Marland Kitchen anastrozole (ARIMIDEX) 1 MG tablet Take 1 tablet (1 mg total) by mouth daily. 90 tablet 4  . Ascorbic Acid (VITAMIN C WITH ROSE HIPS) 500 MG tablet Take 500 mg by mouth daily.    Marland Kitchen atorvastatin (LIPITOR) 20 MG tablet Take 20 mg by mouth at bedtime.     . benzonatate (TESSALON) 200 MG capsule Take 200 mg by mouth 3 (three) times daily as needed for cough.    . Calcium Carbonate-Vit D-Min (CALCIUM 600+D3 PLUS MINERALS) 600-800 MG-UNIT TABS Take 1 tablet by mouth at bedtime.    . colchicine 0.6 MG tablet TAKE 1 TABLET BY MOUTH  DAILY (Patient taking differently: Take 0.6 mg by mouth as needed. ) 90 tablet 3  . Diclofenac Sodium 1 % CREA Apply topically.    .  diphenhydrAMINE HCl (BENADRYL PO) Take by mouth as needed.    . furosemide (LASIX) 20 MG tablet Take 20 mg by mouth daily.      Marland Kitchen gabapentin (NEURONTIN) 300 MG capsule Take 300 mg by mouth 2 (two) times daily.     . hydrocortisone (ANUSOL-HC) 2.5 % rectal cream Place 1 application rectally 2 (two) times daily as needed for hemorrhoids or itching.    . losartan-hydrochlorothiazide (HYZAAR) 100-25 MG tablet Take 1 tablet by mouth daily.     . magnesium oxide (MAG-OX) 400 MG tablet Take 400 mg by mouth  daily.     . metoprolol succinate (TOPROL-XL) 50 MG 24 hr tablet TAKE 1 TABLET BY MOUTH  DAILY, TAKE WITH OR  IMMEDIATELY FOLLOWING A  MEAL 90 tablet 3  . Multiple Vitamin (MULTIVITAMIN) tablet Take 1 tablet by mouth daily. CENTRUM    . nystatin (MYCOSTATIN) 100000 UNIT/ML suspension Take 5 ml QID for 5 days then decrease to bid for maintenance of symptoms 473 mL 3  . omeprazole (PRILOSEC) 40 MG capsule Take 40 mg by mouth daily as needed (FOR ACID REFLUX/INDIGESTION).   12  . potassium chloride SA (K-DUR,KLOR-CON) 20 MEQ tablet Take 10 mEq by mouth daily.    Marland Kitchen triamcinolone cream (KENALOG) 0.1 % Apply 1 application topically 2 (two) times daily as needed (Vaginal Itching).    Alveda Reasons 20 MG TABS tablet TAKE 1 TABLET BY MOUTH  DAILY WITH SUPPER 90 tablet 3   No current facility-administered medications for this visit.    OBJECTIVE: African-American woman who appears stated age  26:   04/20/20 0950  BP: (!) 141/67  Pulse: 75  Resp: 18  Temp: (!) 97.4 F (36.3 C)  SpO2: 98%     Body mass index is 37.67 kg/m.   Wt Readings from Last 3 Encounters:  04/20/20 244 lb 1.6 oz (110.7 kg)  12/07/19 239 lb 9.6 oz (108.7 kg)  08/31/19 243 lb 12.8 oz (110.6 kg)  ECOG FS:1 - Symptomatic but completely ambulatory  Sclerae unicteric, EOMs intact Wearing a mask No cervical or supraclavicular adenopathy Lungs no rales or rhonchi Heart regular rate and rhythm Abd soft, nontender, positive bowel  sounds MSK no focal spinal tenderness, no upper extremity lymphedema Neuro: nonfocal, well oriented, appropriate affect Breasts: The right breast is status post mastectomy and radiation.  There is mild hyperpigmentation.  There is no evidence of chest wall recurrence.  The left breast and both axillae are benign   LAB RESULTS:  CMP     Component Value Date/Time   NA 142 08/31/2019 1057   NA 142 09/22/2015 0000   K 3.9 08/31/2019 1057   CL 102 08/31/2019 1057   CO2 28 08/31/2019 1057   GLUCOSE 82 08/31/2019 1057   BUN 15 08/31/2019 1057   BUN 19 09/22/2015 0000   CREATININE 0.92 (H) 08/31/2019 1057   CALCIUM 9.9 08/31/2019 1057   PROT 6.7 08/31/2019 1057   PROT 6.7 08/31/2019 1057   ALBUMIN 4.1 05/21/2019 1229   AST 34 09/16/2019 1105   ALT 42 (H) 09/16/2019 1105   ALKPHOS 121 05/21/2019 1229   BILITOT 0.6 08/31/2019 1057   GFRNONAA 58 (L) 08/31/2019 1057   GFRAA 67 08/31/2019 1057    Lab Results  Component Value Date   ALBUMINELP 4.0 08/31/2019   A1GS 0.3 08/31/2019   A2GS 0.8 08/31/2019   BETS 0.5 08/31/2019   BETA2SER 0.4 08/31/2019   GAMS 0.9 08/31/2019   SPEI  08/31/2019     Comment:     Normal Serum Protein Electrophoresis Pattern. No abnormal protein bands (M-protein) detected.     No results found for: Nils Pyle, [facility]  Lab Results  Component Value Date   WBC 4.5 08/31/2019   NEUTROABS 1,607 08/31/2019   HGB 13.5 08/31/2019   HCT 41.9 08/31/2019   MCV 82.3 08/31/2019   PLT 150 08/31/2019   No results found for: LABCA2  No components found for: [id number]  No results for input(s): INR in the last 168 hours.  No results found for: LABCA2  No results found  for: VOJ500  No results found for: XFG182  No results found for: XHB716  No results found for: CA2729  No components found for: HGQUANT  No results found for: CEA1 / No results found for: CEA1   No results found for: AFPTUMOR  No results found for:  CHROMOGRNA  No results found for: HGBA, HGBA2QUANT, HGBFQUANT, HGBSQUAN (Hemoglobinopathy evaluation)   Lab Results  Component Value Date   LDH 162 10/22/2006    No results found for: IRON, TIBC, IRONPCTSAT (Iron and TIBC)  No results found for: FERRITIN  Urinalysis    Component Value Date/Time   COLORURINE YELLOW 09/27/2015 [place] 09/27/2015 1532   LABSPEC 1.015 09/27/2015 1532   PHURINE 6.5 09/27/2015 1532   GLUCOSEU NEGATIVE 09/27/2015 1532   HGBUR TRACE (A) 09/27/2015 1532   BILIRUBINUR NEGATIVE 09/27/2015 1532   KETONESUR NEGATIVE 09/27/2015 1532   PROTEINUR NEGATIVE 09/27/2015 1532   UROBILINOGEN 0.2 01/19/2013 0954   NITRITE NEGATIVE 09/27/2015 1532   LEUKOCYTESUR NEGATIVE 09/27/2015 1532    STUDIES: No results found. Lab work obtained at [facility] 04/12/2020 shows a sodium of 140 potassium 4.2 chloride 98 CO2 34 which is unchanged from baseline BUN 20, glucose 109, creatinine 0.95, calcium 10.4 albumin 4.3 total bilirubin 0.9 alkaline phosphatase 117, AST 29 ALT 34, total protein 7.2 and a white cell count of 5.0 hemoglobin 14.4 MCV 83.5 and platelets 174,000.  ELIGIBLE FOR AVAILABLE RESEARCH PROTOCOL: Exact sciences study   ASSESSMENT: 86 y.o. [place] woman status post central right breast biopsy 03/29/2017 for a clinical T1c N0, stage IA invasive lobular carcinoma, grade 2, strongly estrogen and progesterone receptor positive, also HER-2 amplified, with an MIB-1 of 25%  (1) status post right mastectomy with sentinel lymph node sampling 05/07/2017 for a pT3 pN1, stage IIB invasive lobular carcinoma, grade 3, with negative margins.  (2) adjuvant radiation 07/02/2017 - 08/16/2017 Site/dose:   The patient initially received a dose of 50.4 Gy in 28 fractions to the right chest wall and supraclavicular region. This was delivered using a 3-D conformal, 4 field technique. The patient then received a boost to the mastectomy scar. This delivered  an additional 10 Gy in 5 fractions using an en face electron field. The total dose was 60.4 Gy.  (3) trastuzumab adjuvantly for 1 year started 06/04/2017, to be completed 05/13/2018  (a) echocardiogram 04/16/2017 shows an ejection fraction of 60-65%.  (b) echocardiogram 08/21/2017 shows an ejection fraction in the 60-65% range  (c) echo 11/22/2017 shows an ejection fraction in the 60-65% range  (d) echocardiogram 02/27/2018 showed an ejection fraction in the 60-65% range.  (4) anastrozole started 05/28/2017  (a) bone density 10/31/2017 shows a T score of -1.5  (5) small seroma right axilla  (6) history of paroxysmal atrial fibrillation, on lifelong anticoagulation   (a) left jugular and subclavian DVT noted on 01/17/2018 while taking Warfarin   (a) started Enoxaparin 170m daily January 2020, switched to rivaroxaban February 2020   PLAN: EJo-Anneis now 3 years out from definitive surgery for her breast cancer with no evidence of disease recurrence.  This is favorable.  She takes anastrozole with good tolerance and the plan is to continue that a total of 5 years.  I am going to repeat a bone density at the time that she has her mammography in May as it has been more than 2 years since her last one.  I encouraged her to be as active as possible.  I suggested she  might want to start biotin for her hair and nails.  Otherwise she will see [place] again in June of next year after her mammogram in 2023  Total encounter time 25 minutes.*  Sinaya Minogue, Virgie Dad, MD  04/20/20 10:01 AM Medical Oncology and Hematology [facility] [place], [place] [address] Tel. [phone]    Fax. [phone]   I, Wilburn Mylar, am acting as scribe for Dr. Virgie Dad. Taz Vanness.  I, Lurline Del MD, have reviewed the above documentation for accuracy and completeness, and I agree with the above.   *Total Encounter Time as defined by the Centers for Medicare and Medicaid  Services includes, in addition to the face-to-face time of a patient visit (documented in the note above) non-face-to-face time: obtaining and reviewing outside history, ordering and reviewing medications, tests or procedures, care coordination (communications with other health care professionals or caregivers) and documentation in the medical record.

## 2020-04-20 ENCOUNTER — Inpatient Hospital Stay: Payer: Medicare Other | Attending: Oncology | Admitting: Oncology

## 2020-04-20 ENCOUNTER — Other Ambulatory Visit: Payer: Self-pay

## 2020-04-20 VITALS — BP 141/67 | HR 75 | Temp 97.4°F | Resp 18 | Ht 67.5 in | Wt 244.1 lb

## 2020-04-20 DIAGNOSIS — Z79811 Long term (current) use of aromatase inhibitors: Secondary | ICD-10-CM | POA: Insufficient documentation

## 2020-04-20 DIAGNOSIS — Z9011 Acquired absence of right breast and nipple: Secondary | ICD-10-CM | POA: Diagnosis not present

## 2020-04-20 DIAGNOSIS — Z170 Estrogen receptor positive status [ER+]: Secondary | ICD-10-CM | POA: Diagnosis not present

## 2020-04-20 DIAGNOSIS — Z86718 Personal history of other venous thrombosis and embolism: Secondary | ICD-10-CM | POA: Insufficient documentation

## 2020-04-20 DIAGNOSIS — Z90710 Acquired absence of both cervix and uterus: Secondary | ICD-10-CM | POA: Diagnosis not present

## 2020-04-20 DIAGNOSIS — Z923 Personal history of irradiation: Secondary | ICD-10-CM | POA: Insufficient documentation

## 2020-04-20 DIAGNOSIS — Z7901 Long term (current) use of anticoagulants: Secondary | ICD-10-CM | POA: Diagnosis not present

## 2020-04-20 DIAGNOSIS — C50111 Malignant neoplasm of central portion of right female breast: Secondary | ICD-10-CM

## 2020-04-20 DIAGNOSIS — I480 Paroxysmal atrial fibrillation: Secondary | ICD-10-CM | POA: Insufficient documentation

## 2020-04-20 DIAGNOSIS — C50911 Malignant neoplasm of unspecified site of right female breast: Secondary | ICD-10-CM | POA: Diagnosis not present

## 2020-04-20 DIAGNOSIS — Z87891 Personal history of nicotine dependence: Secondary | ICD-10-CM | POA: Insufficient documentation

## 2020-04-20 MED ORDER — ANASTROZOLE 1 MG PO TABS: 1.0000 mg | ORAL_TABLET | Freq: Every day | ORAL | 4 refills | Status: DC

## 2020-04-21 NOTE — Progress Notes (Signed)
Office Visit Note  Patient: Tara Cox             Date of Birth: September 02, 1934           MRN: [id number]             PCP: [name], MD Referring: [name], MD Visit Date: 05/05/2020 Occupation: @GUAROCC @  Subjective:  Pain in both knees   History of Present Illness: Tara Cox is a 86 y.o. female with history of seronegative rheumatoid arthritis, CPPD, osteoarthritis, and DDD.  She has been off of Plaquenil since August 2021 due to macular changes.  She is not currently taking any immunosuppressive agents.  She has not had any signs or symptoms of a rheumatoid arthritis flare since discontinuing Plaquenil.  She states that she has had intermittent symptoms of pseudogout flares and takes colchicine 0.6 mg 1 tablet by mouth daily as needed for symptomatic relief.  She is currently having pain in her left foot consistent with pseudogout and started to take colchicine yesterday.  She continues to have chronic pain in both knee joints.  Her right knee is replaced.  She uses Voltaren gel topically as needed for pain relief which alleviates most of her discomfort.  She also takes gabapentin as prescribed which alleviates her pain.  Patient reports that she has been performing Zumba on a weekly basis and has also started to use an under the desk exercise elliptical with foot pedals. Her right middle trigger finger has resolved since having a cortisone injection at her last office visit.   Activities of Daily Living:  Patient reports morning stiffness for 0 minutes.   Patient Denies nocturnal pain.  Difficulty dressing/grooming: Denies Difficulty climbing stairs: Denies Difficulty getting out of chair: Denies Difficulty using hands for taps, buttons, cutlery, and/or writing: Denies  Review of Systems  Constitutional: Negative for fatigue.  HENT: Positive for mouth dryness and nose dryness. Negative for mouth sores.   Eyes: Positive for dryness. Negative for pain  and itching.  Respiratory: Negative for shortness of breath and difficulty breathing.   Cardiovascular: Negative for chest pain and palpitations.  Gastrointestinal: Negative for blood in stool, constipation and diarrhea.  Endocrine: Negative for increased urination.  Genitourinary: Negative for difficulty urinating.  Musculoskeletal: Positive for arthralgias, joint pain and muscle tenderness. Negative for joint swelling, myalgias, morning stiffness and myalgias.  Skin: Negative for color change, rash and redness.  Allergic/Immunologic: Negative for susceptible to infections.  Neurological: Positive for numbness, headaches and memory loss. Negative for dizziness and weakness.  Hematological: Negative for bruising/bleeding tendency.  Psychiatric/Behavioral: Negative for confusion.    PMFS History:  Patient Active Problem List   Diagnosis Date Noted  . Aortic atherosclerosis ([place]) 04/01/2018  . Chronic deep vein thrombosis (DVT) of left upper extremity ([place]) 01/17/2018  . Rheumatoid arthritis of multiple sites with negative rheumatoid factor ([place]) 11/13/2017  . Port-A-Cath in place 07/24/2017  . Breast cancer, stage 2, right ([place]) 05/07/2017  . Malignant neoplasm of central portion of right breast in female, estrogen receptor positive ([place]) 04/02/2017  . Primary insomnia 09/27/2016  . ANA positive 09/27/2016  . Complete tear of right rotator cuff 05/11/2016  . High risk medication use 05/07/2016  . Uterine fibroid 12/08/2015  . History of miscarriage 12/08/2015  . Esophagitis 12/08/2015  . Diaphragmatic hernia 12/08/2015  . Calcium pyrophosphate deposition disease 12/08/2015  . Osteoarthritis of both hands 12/08/2015  . Osteoarthritis of both feet 12/08/2015  . History of  total right knee replacement 12/08/2015  . Chondromalacia of both patellae 12/08/2015  . DDD (degenerative disc disease), lumbar 12/08/2015  . Essential hypertension 06/26/2013  . Paroxysmal atrial fibrillation  ([place]) 06/26/2013  . Hyperlipidemia 06/26/2013  . DJD (degenerative joint disease) of knee 01/26/2013  . Hemorrhoids 07/11/2010  . IBS (irritable bowel syndrome) 07/11/2010  . Fatigue/loss of sleep 07/11/2010  . Night sweats 07/11/2010  . Chills 07/11/2010  . Weight gain 07/11/2010  . Inflammatory arthritis 07/11/2010  . Incontinence of urine 07/11/2010  . Thrombosed external hemorrhoids 07/11/2010    Past Medical History:  Diagnosis Date  . A-fib ([place])   . Arthritis   . Breast cancer ([place]) 05/07/2017   Right Breast Cancer  . Cancer ([place])   . 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 DDD (degenerative disc disease), lumbar 12/08/2015   S/P discectomy   . Diaphragmatic hernia 12/08/2015  . Dysrhythmia    A-Fib  . Edema of lower extremity 06/29/09   Lower Venous Exam- normal. No evidence of thrombus or thrombophlebitis.  . Esophagitis 12/08/2015  . Frequent urination at night   . GERD (gastroesophageal reflux disease)   . Hemorrhoids   . History of calcium pyrophosphate deposition disease (CPPD) 12/08/2015  . History of hiatal hernia   . History of miscarriage 12/08/2015   x3  . History of total right knee replacement 12/08/2015  . Hypertension 02/24/09   Echo-EF 62%; Myocardial Perfusion Study-Normal. No signifcant ischemia noted.  . IBS (irritable bowel syndrome)   . Migraine   . Osteoarthritis of both feet 12/08/2015  . Osteoarthritis of both hands 12/08/2015  . Personal history of chemotherapy 2019   Right Breast Cancer  . Personal history of radiation therapy 2019   Right Breast Cancer  . PONV (postoperative nausea and vomiting)   . Uterine fibroid 12/08/2015    Family History  Problem Relation Age of Onset  . Other Mother   . Varicose Veins Mother   . Emphysema Mother   . Heart disease Father        heart attack  . Diabetes Sister   . Other Sister        pacemaker  . Diabetes Sister   . Heart disease Sister    Past Surgical History:  Procedure Laterality  Date  . ABDOMINAL HYSTERECTOMY  1974  . APPENDECTOMY    . BACK SURGERY  2001  . BUNIONECTOMY  1986  . CESAREAN SECTION  1971  . COLONOSCOPY    . COLONOSCOPY WITH PROPOFOL N/A 05/28/2014   Procedure: COLONOSCOPY WITH PROPOFOL;  Surgeon: Carol Ada, MD;  Location: [facility];  Service: Endoscopy;  Laterality: N/A;  . ESOPHAGOGASTRODUODENOSCOPY (EGD) WITH PROPOFOL N/A 05/28/2014   Procedure: ESOPHAGOGASTRODUODENOSCOPY (EGD) WITH PROPOFOL;  Surgeon: Carol Ada, MD;  Location: [facility];  Service: Endoscopy;  Laterality: N/A;  . EXPLORATORY LAPAROTOMY     open procedure  . EYE SURGERY Bilateral    cataract removal  . MASTECTOMY Left 05/07/2017  . PORT-A-CATH REMOVAL Right 07/17/2018   Procedure: PORT REMOVAL;  Surgeon: Erroll Luna, MD;  Location: [facility];  Service: General;  Laterality: Right;  . PORTACATH PLACEMENT Right 05/07/2017   Procedure: INSERTION PORT-A-CATH;  Surgeon: Erroll Luna, MD;  Location: [facility];  Service: General;  Laterality: Right;  . SHOULDER SURGERY Right   . SIMPLE MASTECTOMY WITH AXILLARY SENTINEL NODE BIOPSY Right 05/07/2017   Procedure: RIGHT SIMPLE MASTECTOMY WITH AXILLARY SENTINEL NODE BIOPSY;  Surgeon: Erroll Luna, MD;  Location: [facility];  Service:  General;  Laterality: Right;  . TOTAL KNEE ARTHROPLASTY Right 01/26/2013   DR Ronnie Derby  . TOTAL KNEE ARTHROPLASTY Right 01/26/2013   Procedure: TOTAL KNEE ARTHROPLASTY;  Surgeon: Vickey Huger, MD;  Location: [facility];  Service: Orthopedics;  Laterality: Right;  . [place]   Social History   Social History Narrative   [phone] Unable to ask abuse questions husband with her today.   Immunization History  Administered Date(s) Administered  . Influenza, High Dose Seasonal PF 10/07/2015, 10/19/2016, 10/25/2017  . PFIZER Comirnaty(Gray Top)Covid-19 Tri-Sucrose Vaccine 04/13/2020  . PFIZER(Purple Top)SARS-COV-2 Vaccination 01/25/2019, 02/13/2019, 09/15/2019  . Pneumococcal  Conjugate-13 11/24/2013  . Pneumococcal Polysaccharide-23 01/10/2003  . Tdap 03/21/2017, 03/21/2017  . Zoster 08/01/2012  . Zoster Recombinat (Shingrix) 03/21/2017, 03/21/2017     Objective: Vital Signs: BP 140/75 (BP Location: Left Arm, Patient Position: Sitting, Cuff Size: Large)   Pulse 73   Resp 16   Ht 5\' 7"  (1.702 m)   Wt 246 lb 6.4 oz (111.8 kg)   BMI 38.59 kg/m    Physical Exam Vitals and nursing note reviewed.  Constitutional:      Appearance: She is well-developed.  HENT:     Head: Normocephalic and atraumatic.  Eyes:     Conjunctiva/sclera: Conjunctivae normal.  Pulmonary:     Effort: Pulmonary effort is normal.  Abdominal:     Palpations: Abdomen is soft.  Musculoskeletal:     Cervical back: Normal range of motion.  Skin:    General: Skin is warm and dry.     Capillary Refill: Capillary refill takes less than 2 seconds.  Neurological:     Mental Status: She is alert and oriented to person, place, and time.  Psychiatric:        Behavior: Behavior normal.      Musculoskeletal Exam: C-spine has good range of motion with no discomfort.  Shoulder joints, elbow joints, wrist joints, MCPs, PIPs, DIPs have good range of motion with no synovitis.  She has PIP and DIP thickening consistent with osteoarthritis of both hands.  No tenderness or synovitis of MCP joints.  Complete fist formation bilaterally.  Right knee replacement has good range of motion with no warmth or effusion.  Left knee has good range of motion with no warmth or effusion.  Ankle joints have good range of motion with no tenderness or discomfort.  CDAI Exam: CDAI Score: -- Patient Global: --; Provider Global: -- Swollen: --; Tender: -- Joint Exam 05/05/2020   No joint exam has been documented for this visit   There is currently no information documented on the homunculus. Go to the Rheumatology activity and complete the homunculus joint exam.  Investigation: No additional  findings.  Imaging: No results found.  Recent Labs: Lab Results  Component Value Date   WBC 4.5 08/31/2019   HGB 13.5 08/31/2019   PLT 150 08/31/2019   NA 142 08/31/2019   K 3.9 08/31/2019   CL 102 08/31/2019   CO2 28 08/31/2019   GLUCOSE 82 08/31/2019   BUN 15 08/31/2019   CREATININE 0.92 (H) 08/31/2019   BILITOT 0.6 08/31/2019   ALKPHOS 121 05/21/2019   AST 34 09/16/2019   ALT 42 (H) 09/16/2019   PROT 6.7 08/31/2019   PROT 6.7 08/31/2019   ALBUMIN 4.1 05/21/2019   CALCIUM 9.9 08/31/2019   GFRAA 67 08/31/2019   QFTBGOLDPLUS NEGATIVE 08/31/2019    Speciality Comments: PLQ eye exam: 08/21/18 normal. Dr. Katy Fitch. Follow up in 1 year.  Procedures:  No procedures performed Allergies: Oxycodone, Hydrocodone, Percocet [oxycodone-acetaminophen], Soma [carisoprodol], and Ultram [tramadol hcl]   Assessment / Plan:     Visit Diagnoses: Rheumatoid arthritis of multiple sites with negative rheumatoid factor ([place]) - Discontinued Plaquenil-August 2021 due to macular changes.  She is not currently taking any immunosuppressive changes.  She has no joint tenderness or synovitis on examination today.  She has not had any rheumatoid arthritis flares since discontinuing Plaquenil.  She experiences chronic pain in both knee joints especially in her right knee replacement.  She uses Voltaren gel topically as needed which improves her discomfort significantly.   She has no warmth or effusion of her knee joints at this time. She does not require immunosuppressive therapy at this time.  She was advised to notify us if she develops increased joint pain or joint swelling.  She will follow-up in the office in 6 months.  Trigger finger, right ring finger -Resolve. Ultrasound-guided cortisone injection on 12/07/2019.   Calcium pyrophosphate deposition disease: She experiences intermittent flares.  She takes colchicine 0.6 mg 1 tablet by mouth daily as needed during flares.  She started to experience symptoms  of a flare last night in her left foot.  She took 1 dose of colchicine last night and plans on taking it until her symptoms resolved.  A refill of colchicine was sent to the pharmacy.  Primary osteoarthritis of both hands: She has PIP and DIP thickening consistent with osteoarthritis of both hands.  No joint tenderness or inflammation was noted.  She has complete fist formation bilaterally.  We discussed the importance of joint protection and muscle strengthening.  Primary osteoarthritis of both feet: She is not experiencing any discomfort in her feet at this time.  She has good range of motion of both ankle joints with no tenderness to palpation.  Plantar fasciitis, bilateral - Resolved.  History of total right knee replacement: Chronic pain.  She has good range of motion of the right knee which was replaced.  No warmth or effusion was noted.  She uses Voltaren gel topically as needed for pain relief which alleviates her discomfort.  DDD (degenerative disc disease), lumbar: She is not experiencing any discomfort in her lower back at this time.  She has no midline spinal tenderness or SI joint tenderness.  Osteopenia of multiple sites: She is taking a calcium and vitamin D supplement on a daily basis.  Order for bone density is in place.  BMI 38.0-38.9,adult:  Discussed weight loss tactics in detail.  She has been trying to eat more fruits and vegetables and drinks lots of water throughout the day.  Her hemoglobin A1c was 6.0 on 04/12/2020.   She was strongly encouraged to restart weight watchers.  We discussed the importance of monitoring her carbohydrate intake. She was strongly encouraged to continue to use her under-desk exercise elliptical with foot petals on a daily basis as well as going to zumba on a weekly basis.  Other medical conditions are listed as follows:   Paroxysmal atrial fibrillation ([place])  Pure hypercholesterolemia  Essential hypertension  Malignant neoplasm of central  portion of right breast in female, estrogen receptor positive ([place])  History of DVT (deep vein thrombosis)   Orders: No orders of the defined types were placed in this encounter.  Meds ordered this encounter  Medications  . colchicine 0.6 MG tablet    Sig: Take 1 tablet (0.6 mg total) by mouth as needed.    Dispense:  90 tablet    Refill:  1    Follow-Up Instructions: Return in about 6 months (around 11/04/2020) for Rheumatoid arthritis, Osteoarthritis, DDD.   Ofilia Neas, PA-C  Note - This record has been created using Dragon software.  Chart creation errors have been sought, but may not always  have been located. Such creation errors do not reflect on  the standard of medical care.

## 2020-04-25 ENCOUNTER — Other Ambulatory Visit (HOSPITAL_COMMUNITY): Payer: Self-pay

## 2020-04-25 MED ORDER — COVID-19 MRNA VAC-TRIS(PFIZER) 30 MCG/0.3ML IM SUSP
INTRAMUSCULAR | 0 refills | Status: DC
  Filled 2020-04-25: qty 0.3, 1d supply, fill #0

## 2020-04-27 ENCOUNTER — Other Ambulatory Visit (HOSPITAL_COMMUNITY): Payer: Self-pay

## 2020-05-05 ENCOUNTER — Ambulatory Visit: Payer: Medicare Other | Admitting: Physician Assistant

## 2020-05-05 ENCOUNTER — Other Ambulatory Visit: Payer: Self-pay

## 2020-05-05 ENCOUNTER — Encounter: Payer: Self-pay | Admitting: Physician Assistant

## 2020-05-05 VITALS — BP 140/75 | HR 73 | Resp 16 | Ht 67.0 in | Wt 246.4 lb

## 2020-05-05 DIAGNOSIS — M0609 Rheumatoid arthritis without rheumatoid factor, multiple sites: Secondary | ICD-10-CM

## 2020-05-05 DIAGNOSIS — I480 Paroxysmal atrial fibrillation: Secondary | ICD-10-CM

## 2020-05-05 DIAGNOSIS — C50111 Malignant neoplasm of central portion of right female breast: Secondary | ICD-10-CM

## 2020-05-05 DIAGNOSIS — M65341 Trigger finger, right ring finger: Secondary | ICD-10-CM | POA: Diagnosis not present

## 2020-05-05 DIAGNOSIS — I10 Essential (primary) hypertension: Secondary | ICD-10-CM

## 2020-05-05 DIAGNOSIS — M19072 Primary osteoarthritis, left ankle and foot: Secondary | ICD-10-CM

## 2020-05-05 DIAGNOSIS — M19042 Primary osteoarthritis, left hand: Secondary | ICD-10-CM

## 2020-05-05 DIAGNOSIS — M19071 Primary osteoarthritis, right ankle and foot: Secondary | ICD-10-CM

## 2020-05-05 DIAGNOSIS — E7800 Pure hypercholesterolemia, unspecified: Secondary | ICD-10-CM

## 2020-05-05 DIAGNOSIS — M19041 Primary osteoarthritis, right hand: Secondary | ICD-10-CM

## 2020-05-05 DIAGNOSIS — M1120 Other chondrocalcinosis, unspecified site: Secondary | ICD-10-CM

## 2020-05-05 DIAGNOSIS — Z6838 Body mass index (BMI) 38.0-38.9, adult: Secondary | ICD-10-CM

## 2020-05-05 DIAGNOSIS — Z170 Estrogen receptor positive status [ER+]: Secondary | ICD-10-CM

## 2020-05-05 DIAGNOSIS — M722 Plantar fascial fibromatosis: Secondary | ICD-10-CM

## 2020-05-05 DIAGNOSIS — M5136 Other intervertebral disc degeneration, lumbar region: Secondary | ICD-10-CM

## 2020-05-05 DIAGNOSIS — Z96651 Presence of right artificial knee joint: Secondary | ICD-10-CM

## 2020-05-05 DIAGNOSIS — Z86718 Personal history of other venous thrombosis and embolism: Secondary | ICD-10-CM

## 2020-05-05 DIAGNOSIS — Z6837 Body mass index (BMI) 37.0-37.9, adult: Secondary | ICD-10-CM

## 2020-05-05 DIAGNOSIS — M8589 Other specified disorders of bone density and structure, multiple sites: Secondary | ICD-10-CM

## 2020-05-05 MED ORDER — COLCHICINE 0.6 MG PO TABS: 0.6000 mg | ORAL_TABLET | ORAL | 1 refills | Status: DC | PRN

## 2020-05-06 ENCOUNTER — Other Ambulatory Visit: Payer: Self-pay | Admitting: *Deleted

## 2020-05-06 MED ORDER — COLCHICINE 0.6 MG PO TABS: ORAL_TABLET | ORAL | 0 refills | Status: DC

## 2020-06-03 ENCOUNTER — Ambulatory Visit
Admission: RE | Admit: 2020-06-03 | Discharge: 2020-06-03 | Disposition: A | Discharge status: Home / Self Care | Payer: Medicare Other | Source: Ambulatory Visit | Attending: Oncology | Admitting: Oncology

## 2020-06-03 ENCOUNTER — Other Ambulatory Visit: Payer: Self-pay | Admitting: Oncology

## 2020-06-03 ENCOUNTER — Other Ambulatory Visit: Payer: Self-pay

## 2020-06-03 DIAGNOSIS — Z0000 Encounter for general adult medical examination without abnormal findings: Secondary | ICD-10-CM

## 2020-06-08 ENCOUNTER — Telehealth: Payer: Self-pay | Admitting: *Deleted

## 2020-06-08 ENCOUNTER — Other Ambulatory Visit: Payer: Self-pay | Admitting: Oncology

## 2020-06-08 DIAGNOSIS — R928 Other abnormal and inconclusive findings on diagnostic imaging of breast: Secondary | ICD-10-CM

## 2020-06-08 NOTE — Telephone Encounter (Signed)
Pt called asking about reason for additional scans on breast - she had her screening mammogram last week ( left only due to right mastectomy ).  Discussed area of concern on screening and need for additional views.  Tara Cox also stated she did yard work Monday- " you know using a shovel and having to press down and all " with noted pain in her shoulder and some swelling in upper chest above mastectomy site.  She used ice and applied diclofenac cream " because I have arthritis in my knees and that helps " She also took a tylenol.  Today she woke and " everything was fine but then later I noticed some mild swelling on her right elbow with minimal tenderness.  She again applied ice and took a tylenol - presently with resolution.  This RN reviewed above relating to lymph node dissection - need to monitor for possible need for visit to the lymphedema clinic if continues .  She states she has a sleeve- and has not been wearing it.  Discussed use of sleeve for benefit.  Presently no further needs at this time.  This RN will call pt tomorrow for follow up of right arm issues.

## 2020-06-09 ENCOUNTER — Telehealth: Payer: Self-pay | Admitting: *Deleted

## 2020-06-09 DIAGNOSIS — C50111 Malignant neoplasm of central portion of right female breast: Secondary | ICD-10-CM

## 2020-06-09 DIAGNOSIS — Z170 Estrogen receptor positive status [ER+]: Secondary | ICD-10-CM

## 2020-06-09 DIAGNOSIS — I890 Lymphedema, not elsewhere classified: Secondary | ICD-10-CM

## 2020-06-09 NOTE — Telephone Encounter (Signed)
This RN spoke with pt today to follow up on status of transient right arm swelling post yard work.  Presently she states arm is improved- she wore her compression sleeve pm yesterday ( removed for sleep) and then put it back on this morning. While out grocery shopping she noticed tightness at the wrist and her hand had some swelling. She removed the sleeve and hand swelling resolved.  Per discussion this RN ref being placed for evaluation at the Lymphedema clinic.  Referral placed.  No further needs at this time- pt understands to call if needed.

## 2020-06-16 ENCOUNTER — Other Ambulatory Visit: Payer: Self-pay

## 2020-06-16 ENCOUNTER — Ambulatory Visit: Payer: Medicare Other | Attending: Oncology | Admitting: Rehabilitation

## 2020-06-16 ENCOUNTER — Encounter: Payer: Self-pay | Admitting: Rehabilitation

## 2020-06-16 DIAGNOSIS — I972 Postmastectomy lymphedema syndrome: Secondary | ICD-10-CM | POA: Diagnosis not present

## 2020-06-16 NOTE — Therapy (Signed)
[place], [place], [address] Phone: [phone]   Fax:  [phone]  Physical Therapy Evaluation  Patient Details  Name: Tara Cox MRN: [id number] Date of Birth: January 21, 1934 Referring Provider (PT): Dr. Jana Hakim   Encounter Date: 06/16/2020   PT End of Session - 06/16/20 0927     Visit Number 1    Number of Visits 1    Date for PT Re-Evaluation 06/16/20    PT Start Time 0800    PT Stop Time 0856    PT Time Calculation (min) 56 min    Activity Tolerance Patient tolerated treatment well    Behavior During Therapy [facility] for tasks assessed/performed             Past Medical History:  Diagnosis Date   A-fib [facility])    Arthritis    Breast cancer ([place]) 05/07/2017   Right Breast Cancer   Cancer ([place])    Chondromalacia, patella 12/08/2015   DDD (degenerative disc disease), lumbar 12/08/2015   S/P discectomy    Diaphragmatic hernia 12/08/2015   Dysrhythmia    A-Fib   Edema of lower extremity 06/29/09   Lower Venous Exam- normal. No evidence of thrombus or thrombophlebitis.   Esophagitis 12/08/2015   Frequent urination at night    GERD (gastroesophageal reflux disease)    Hemorrhoids    History of calcium pyrophosphate deposition disease (CPPD) 12/08/2015   History of hiatal hernia    History of miscarriage 12/08/2015   x3   History of total right knee replacement 12/08/2015   Hypertension 02/24/09   Echo-EF 62%; Myocardial Perfusion Study-Normal. No signifcant ischemia noted.   IBS (irritable bowel syndrome)    Migraine    Osteoarthritis of both feet 12/08/2015   Osteoarthritis of both hands 12/08/2015   Personal history of chemotherapy 2019   Right Breast Cancer   Personal history of radiation therapy 2019   Right Breast Cancer   PONV (postoperative nausea and vomiting)    Uterine fibroid 12/08/2015    Past Surgical History:  Procedure Laterality Date   ABDOMINAL  HYSTERECTOMY  1974   APPENDECTOMY     BACK SURGERY  2001   [place]   COLONOSCOPY     COLONOSCOPY WITH PROPOFOL N/A 05/28/2014   Procedure: COLONOSCOPY WITH PROPOFOL;  Surgeon: Carol Ada, MD;  Location: [facility];  Service: Endoscopy;  Laterality: N/A;   ESOPHAGOGASTRODUODENOSCOPY (EGD) WITH PROPOFOL N/A 05/28/2014   Procedure: ESOPHAGOGASTRODUODENOSCOPY (EGD) WITH PROPOFOL;  Surgeon: Carol Ada, MD;  Location: [facility];  Service: Endoscopy;  Laterality: N/A;   EXPLORATORY LAPAROTOMY     open procedure   EYE SURGERY Bilateral    cataract removal   MASTECTOMY Left 05/07/2017   PORT-A-CATH REMOVAL Right 07/17/2018   Procedure: PORT REMOVAL;  Surgeon: Erroll Luna, MD;  Location: [place];  Service: General;  Laterality: Right;   PORTACATH PLACEMENT Right 05/07/2017   Procedure: INSERTION PORT-A-CATH;  Surgeon: Erroll Luna, MD;  Location: [place];  Service: General;  Laterality: Right;   SHOULDER SURGERY Right    SIMPLE MASTECTOMY WITH AXILLARY SENTINEL NODE BIOPSY Right 05/07/2017   Procedure: RIGHT SIMPLE MASTECTOMY WITH AXILLARY SENTINEL NODE BIOPSY;  Surgeon: Erroll Luna, MD;  Location: [place];  Service: General;  Laterality: Right;   TOTAL KNEE ARTHROPLASTY Right 01/26/2013   DR Ronnie Derby   TOTAL KNEE ARTHROPLASTY Right 01/26/2013   Procedure: TOTAL KNEE ARTHROPLASTY;  Surgeon: Vickey Huger,  MD;  Location: [place];  Service: Orthopedics;  Laterality: Right;   UTERINE FIBROID SURGERY  1967    There were no vitals filed for this visit.    Subjective Assessment - 06/16/20 0802     Subjective I was gardening and then the next day the chest had pain and some swelling.  Then it started hurting into the Rt shoulder and then the whole arm.  The ice and sleeve and dicolfenac helped.  I wore my sleeve all day and then my hand was swelling.  Maybe the sleeve doesn't fit well.  It is back to normal.    Pertinent History Right  mastectomy and sentinel node biopsy (2/4 positive nodes) on 05/07/17. Radiation completed Rt side, Right rotator cuff repair 3/18. Lumbar discectomy 2001 and right TKR 2014., Lt bad knee    Currently in Pain? No/denies                [facility] PT Assessment - 06/16/20 0001       Assessment   Medical Diagnosis Rt breast cancer    Referring Provider (PT) Dr. Jana Hakim    Onset Date/Surgical Date 01/08/17    Hand Dominance Right    Next MD Visit next week    Prior Therapy yes here      Precautions   Precaution Comments lymphedema Rt      Balance Screen   Has the patient fallen in the past 6 months No    Has the patient had a decrease in activity level because of a fear of falling?  No    Is the patient reluctant to leave their home because of a fear of falling?  No      Home Ecologist residence    Living Arrangements Spouse/significant other    Available Help at Discharge Family      Prior Function   Level of Independence Independent with basic ADLs    Vocation Retired    [employer]   Overall Cognitive Status Within Functional Limits for tasks assessed      Observation/Other Assessments   Observations No general edema today      Coordination   Gross Motor Movements are Fluid and Coordinated Yes      Posture/Postural Control   Posture/Postural Control Postural limitations    Postural Limitations Rounded Shoulders;Forward head      ROM / Strength   AROM / PROM / Strength --   no limitations in ADLS              LYMPHEDEMA/ONCOLOGY QUESTIONNAIRE - 06/16/20 0001       Type   Cancer Type Right breast cancer      Surgeries   Mastectomy Date 05/07/17    Sentinel Lymph Node Biopsy Date 05/07/17    Number Lymph Nodes Removed 4      Treatment   Active Chemotherapy Treatment No    Past Chemotherapy Treatment Yes    Active Radiation Treatment No    Past Radiation Treatment Yes    Current Hormone Treatment Yes       What other symptoms do you have   Are you Having Heaviness or Tightness No      Lymphedema Assessments   Lymphedema Assessments Upper extremities      Right Upper Extremity Lymphedema   At Axilla  39 cm    15 cm Proximal to Olecranon Process 37.7 cm    10 cm Proximal  to Olecranon Process 36.3 cm    Olecranon Process 30 cm    15 cm Proximal to Ulnar Styloid Process 27.9 cm    10 cm Proximal to Ulnar Styloid Process 24.5 cm    Just Proximal to Ulnar Styloid Process 18.8 cm    Across Hand at [employer] 19.5 cm    At [place] of 2nd Digit 6.7 cm    Other 2737      Left Upper Extremity Lymphedema   At Axilla  39 cm    15 cm Proximal to Olecranon Process 37.5 cm    10 cm Proximal to Olecranon Process 35.5 cm    Olecranon Process 29.9 cm    15 cm Proximal to Ulnar Styloid Process 27.6 cm    10 cm Proximal to Ulnar Styloid Process 24 cm    Just Proximal to Ulnar Styloid Process 18 cm    Across Hand at [employer] 19 cm    At [place] of 2nd Digit 6.6 cm    Other 2662             L-DEX FLOWSHEETS - 06/16/20 0800       L-DEX LYMPHEDEMA SCREENING   Measurement Type Unilateral    L-DEX MEASUREMENT EXTREMITY Upper Extremity    POSITION  Standing    DOMINANT SIDE Right    At Risk Side Right    L-DEX SCORE (UNILATERAL) 3.1                    Objective measurements completed on examination: See above findings.       [place] Adult PT Treatment/Exercise - 06/16/20 0001       Manual Therapy   Manual Therapy Edema management    Manual therapy comments donned pts current Juzo soft sleeve which fits well and does not seem too tight at the wrist.  Measured pt for juzo soft gauntlet but pt wants to pick up locally so she will go to a special place to have them measure/order.  Pt was shown compression bra for chest but pt does not like bras overall so was instructed in holding pillow or towel roll in axilla                    PT Education - 06/16/20 0926      Education Details how to get handpiece, when to return to PT, sleeve donning    Person(s) Educated Patient    Methods Explanation;Demonstration;Handout    Comprehension Verbalized understanding                 PT Long Term Goals - 06/16/20 0941       PT LONG TERM GOAL #1   Title Pt will be knowlegeable about use of compression sleeve and gauntlet with activity    Time 1    Period Days    Status Achieved                    Plan - 06/16/20 0928     Clinical Impression Statement Pt returns to PT here with some Rt UE and axillary edema that occurs mostly with activity most recently with gardening and sometimes cooking.  Pt has less than 173ml difference today and scores a +3.1 in the green zone on SOZO testing.  Pt has a well fitting Juzo soft sleeve but no hand piece so she will get a matching hand piece from a special place.  Pt does have  intermittent axillary swelling and was educated on compression bras and tanks but pt does not like bras in general so pt will use a pillow or towel roll when that acts up.  Pt knows to wear the sleeve when being active or as needed and to let PT know if she still experiences swelling still when using the sleeve.    Personal Factors and Comorbidities Age;Fitness;Comorbidity 2    Comorbidities SLNB, radiation history    Examination-Activity Limitations --   none overall   Examination-Participation Restrictions Yard Work    Stability/Clinical Decision Making Stable/Uncomplicated    Clinical Decision Making Low    Rehab Potential Excellent    PT Frequency One time visit    PT Treatment/Interventions Manual lymph drainage;Patient/family education;Therapeutic exercise    PT Next Visit Plan reassess lymphedema when needed    Consulted and Agree with Plan of Care Patient             Patient will benefit from skilled therapeutic intervention in order to improve the following deficits and impairments:  Increased edema  Visit  Diagnosis: Postmastectomy lymphedema     Problem List Patient Active Problem List   Diagnosis Date Noted   Aortic atherosclerosis ([place]) 04/01/2018   Chronic deep vein thrombosis (DVT) of left upper extremity ([place]) 01/17/2018   Rheumatoid arthritis of multiple sites with negative rheumatoid factor ([place]) 11/13/2017   Port-A-Cath in place 07/24/2017   Breast cancer, stage 2, right ([place]) 05/07/2017   Malignant neoplasm of central portion of right breast in female, estrogen receptor positive ([place]) 04/02/2017   Primary insomnia 09/27/2016   ANA positive 09/27/2016   Complete tear of right rotator cuff 05/11/2016   High risk medication use 05/07/2016   Uterine fibroid 12/08/2015   History of miscarriage 12/08/2015   Esophagitis 12/08/2015   Diaphragmatic hernia 12/08/2015   Calcium pyrophosphate deposition disease 12/08/2015   Osteoarthritis of both hands 12/08/2015   Osteoarthritis of both feet 12/08/2015   History of total right knee replacement 12/08/2015   Chondromalacia of both patellae 12/08/2015   DDD (degenerative disc disease), lumbar 12/08/2015   Essential hypertension 06/26/2013   Paroxysmal atrial fibrillation ([place]) 06/26/2013   Hyperlipidemia 06/26/2013   DJD (degenerative joint disease) of knee 01/26/2013   Hemorrhoids 07/11/2010   IBS (irritable bowel syndrome) 07/11/2010   Fatigue/loss of sleep 07/11/2010   Night sweats 07/11/2010   Chills 07/11/2010   Weight gain 07/11/2010   Inflammatory arthritis 07/11/2010   Incontinence of urine 07/11/2010   Thrombosed external hemorrhoids 07/11/2010    Stark Bray 06/16/2020, 9:44 AM  [place], [place], [address] Phone: [phone]   Fax:  [phone]  Name: Tara Cox MRN: [id number] Date of Birth: [birth year]

## 2020-06-29 ENCOUNTER — Other Ambulatory Visit: Payer: Self-pay

## 2020-06-29 ENCOUNTER — Ambulatory Visit
Admission: RE | Admit: 2020-06-29 | Discharge: 2020-06-29 | Disposition: A | Discharge status: Home / Self Care | Payer: Medicare Other | Source: Ambulatory Visit | Attending: Oncology | Admitting: Oncology

## 2020-06-29 ENCOUNTER — Other Ambulatory Visit: Payer: Medicare Other

## 2020-06-29 DIAGNOSIS — R928 Other abnormal and inconclusive findings on diagnostic imaging of breast: Secondary | ICD-10-CM

## 2020-07-30 ENCOUNTER — Other Ambulatory Visit: Payer: Self-pay | Admitting: Oncology

## 2020-07-31 ENCOUNTER — Encounter: Payer: Self-pay | Admitting: Oncology

## 2020-09-14 ENCOUNTER — Other Ambulatory Visit (HOSPITAL_COMMUNITY): Payer: Self-pay | Admitting: Cardiology
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
## 2020-09-15 ENCOUNTER — Encounter: Payer: Self-pay | Admitting: Physician Assistant

## 2020-09-15 ENCOUNTER — Other Ambulatory Visit: Payer: Self-pay

## 2020-09-15 ENCOUNTER — Ambulatory Visit: Payer: Self-pay

## 2020-09-15 ENCOUNTER — Ambulatory Visit: Payer: Medicare Other | Admitting: Physician Assistant

## 2020-09-15 VITALS — BP 105/67 | HR 78 | Ht 67.5 in | Wt 244.4 lb

## 2020-09-15 DIAGNOSIS — M19041 Primary osteoarthritis, right hand: Secondary | ICD-10-CM

## 2020-09-15 DIAGNOSIS — M533 Sacrococcygeal disorders, not elsewhere classified: Secondary | ICD-10-CM | POA: Diagnosis not present

## 2020-09-15 DIAGNOSIS — E7800 Pure hypercholesterolemia, unspecified: Secondary | ICD-10-CM

## 2020-09-15 DIAGNOSIS — Z96651 Presence of right artificial knee joint: Secondary | ICD-10-CM

## 2020-09-15 DIAGNOSIS — M722 Plantar fascial fibromatosis: Secondary | ICD-10-CM

## 2020-09-15 DIAGNOSIS — M19071 Primary osteoarthritis, right ankle and foot: Secondary | ICD-10-CM

## 2020-09-15 DIAGNOSIS — M65341 Trigger finger, right ring finger: Secondary | ICD-10-CM

## 2020-09-15 DIAGNOSIS — M19042 Primary osteoarthritis, left hand: Secondary | ICD-10-CM

## 2020-09-15 DIAGNOSIS — I480 Paroxysmal atrial fibrillation: Secondary | ICD-10-CM

## 2020-09-15 DIAGNOSIS — G8929 Other chronic pain: Secondary | ICD-10-CM | POA: Diagnosis not present

## 2020-09-15 DIAGNOSIS — M5441 Lumbago with sciatica, right side: Secondary | ICD-10-CM | POA: Diagnosis not present

## 2020-09-15 DIAGNOSIS — M1120 Other chondrocalcinosis, unspecified site: Secondary | ICD-10-CM | POA: Diagnosis not present

## 2020-09-15 DIAGNOSIS — M8589 Other specified disorders of bone density and structure, multiple sites: Secondary | ICD-10-CM

## 2020-09-15 DIAGNOSIS — M19072 Primary osteoarthritis, left ankle and foot: Secondary | ICD-10-CM

## 2020-09-15 DIAGNOSIS — C50111 Malignant neoplasm of central portion of right female breast: Secondary | ICD-10-CM

## 2020-09-15 DIAGNOSIS — Z86718 Personal history of other venous thrombosis and embolism: Secondary | ICD-10-CM

## 2020-09-15 DIAGNOSIS — M0609 Rheumatoid arthritis without rheumatoid factor, multiple sites: Secondary | ICD-10-CM

## 2020-09-15 DIAGNOSIS — M5136 Other intervertebral disc degeneration, lumbar region: Secondary | ICD-10-CM

## 2020-09-15 DIAGNOSIS — Z170 Estrogen receptor positive status [ER+]: Secondary | ICD-10-CM

## 2020-09-15 DIAGNOSIS — I10 Essential (primary) hypertension: Secondary | ICD-10-CM

## 2020-09-15 MED ORDER — PREDNISONE 5 MG PO TABS: ORAL_TABLET | ORAL | 0 refills | Status: DC

## 2020-09-27 ENCOUNTER — Ambulatory Visit: Payer: Medicare Other | Attending: Internal Medicine

## 2020-09-27 ENCOUNTER — Other Ambulatory Visit (HOSPITAL_BASED_OUTPATIENT_CLINIC_OR_DEPARTMENT_OTHER): Payer: Self-pay

## 2020-09-27 DIAGNOSIS — Z23 Encounter for immunization: Secondary | ICD-10-CM

## 2020-09-27 MED ORDER — PFIZER COVID-19 VAC BIVALENT 30 MCG/0.3ML IM SUSP
INTRAMUSCULAR | 0 refills | Status: DC
  Filled 2020-09-27: qty 0.3, 1d supply, fill #0

## 2020-09-27 NOTE — Progress Notes (Signed)
   Covid-19 Vaccination Clinic  Name:  Tara Cox    MRN: [id number] DOB: December 20, 1934  09/27/2020  [name] was observed post Covid-19 immunization for 15 minutes without incident. She was provided with Vaccine Information Sheet and instruction to access the V-Safe system.   [name] was instructed to call 911 with any severe reactions post vaccine: Difficulty breathing  Swelling of face and throat  A fast heartbeat  A bad rash all over body  Dizziness and weakness

## 2020-10-06 ENCOUNTER — Telehealth: Payer: Self-pay

## 2020-10-06 NOTE — Telephone Encounter (Signed)
Patient called stating the Prednisone taper did not help her right leg pain.  Patient states the pain is from her right hip to the bottom of her right foot.  Patient states walking does help with the pain, but the minute she stops the pain returns.  Patient states there is a wedding she was hoping to attend next week in [place], but is not sure if she can sit in the car that long.  Patient requested a return call to let her know if there is anything else she can do.

## 2020-10-06 NOTE — Telephone Encounter (Signed)
Patient has been scheduled for an appointment on 10/10/2020 at 9:20 am.

## 2020-10-06 NOTE — Telephone Encounter (Signed)
I reviewed Tara Cox's note.  Patient declined cortisone injection at the last visit.  If the patient is interested in getting cortisone injection then she may schedule an appointment.

## 2020-10-06 NOTE — Telephone Encounter (Signed)
Patient called stating the Prednisone taper did not help her right leg pain.  Patient states she did not take Tylenol daily. Patient states Voltaren gel and ice has helped the most. Patient states the pain is from her right hip to the bottom of her right foot.  Patient states walking does help with the pain, but the minute she stops the pain returns. Patient states it is a burning sensation.  Patient states there is a wedding she was hoping to attend next week in [place], but is not sure if she can sit in the car that long.  Patient requested a return call to let her know if there is anything else she can do. Please advise.

## 2020-10-10 ENCOUNTER — Other Ambulatory Visit: Payer: Self-pay

## 2020-10-10 ENCOUNTER — Ambulatory Visit: Payer: Medicare Other | Admitting: Physician Assistant

## 2020-10-10 VITALS — BP 138/77 | HR 80

## 2020-10-10 DIAGNOSIS — M533 Sacrococcygeal disorders, not elsewhere classified: Secondary | ICD-10-CM

## 2020-10-10 DIAGNOSIS — G8929 Other chronic pain: Secondary | ICD-10-CM | POA: Diagnosis not present

## 2020-10-10 MED ORDER — TRIAMCINOLONE ACETONIDE 40 MG/ML IJ SUSP
40.0000 mg | INTRAMUSCULAR | Status: AC | PRN
  Administered 2020-10-10: 40 mg via INTRA_ARTICULAR

## 2020-10-10 MED ORDER — LIDOCAINE HCL 1 % IJ SOLN
1.0000 mL | INTRAMUSCULAR | Status: AC | PRN
  Administered 2020-10-10: 1 mL

## 2020-10-10 NOTE — Progress Notes (Signed)
   Procedure Note  Patient: Tara Cox             Date of Birth: 1934/06/24           MRN: [id number]             Visit Date: 10/10/2020  Procedures: Visit Diagnoses:  1. Chronic right SI joint pain     Sacroiliac Joint Inj on 10/10/2020 8:39 AM Indications: pain Details: 27 G 1.5 in needle, posterior approach Medications: 1 mL lidocaine 1 %; 40 mg triamcinolone acetonide 40 MG/ML Aspirate: 0 mL Outcome: tolerated well, no immediate complications Procedure, treatment alternatives, risks and benefits explained, specific risks discussed. Consent was given by the patient. Immediately prior to procedure a time out was called to verify the correct patient, procedure, equipment, support staff and site/side marked as required. Patient was prepped and draped in the usual sterile fashion.    Patient reports she had some improvement in her lower back discomfort after completing the prednisone taper.  On exam today she has tenderness over the right SI joint.  No midline spinal tenderness.   X-rays of the pelvis were unremarkable on 09/15/20. X-rays of the lumbar spine were consistent with levoscoliosis, mild spondylosis, and facet joint arthropathy.  Patient tolerated the procedure well.  Aftercare was discussed. Advised the patient to notify us if her discomfort persists or worsens.   Hazel Sams, PA-C

## 2020-10-31 ENCOUNTER — Ambulatory Visit
Admission: RE | Admit: 2020-10-31 | Discharge: 2020-10-31 | Disposition: A | Discharge status: Home / Self Care | Payer: Medicare Other | Source: Ambulatory Visit | Attending: Oncology | Admitting: Oncology

## 2020-10-31 ENCOUNTER — Encounter: Payer: Self-pay | Admitting: Oncology

## 2020-10-31 ENCOUNTER — Other Ambulatory Visit: Payer: Self-pay

## 2020-10-31 DIAGNOSIS — C50911 Malignant neoplasm of unspecified site of right female breast: Secondary | ICD-10-CM

## 2020-10-31 DIAGNOSIS — Z170 Estrogen receptor positive status [ER+]: Secondary | ICD-10-CM

## 2020-10-31 DIAGNOSIS — C50111 Malignant neoplasm of central portion of right female breast: Secondary | ICD-10-CM

## 2020-11-02 ENCOUNTER — Ambulatory Visit: Payer: Medicare Other | Admitting: Physician Assistant

## 2020-11-02 ENCOUNTER — Telehealth: Payer: Self-pay

## 2020-11-02 ENCOUNTER — Other Ambulatory Visit: Payer: Self-pay

## 2020-11-02 ENCOUNTER — Encounter: Payer: Self-pay | Admitting: Physician Assistant

## 2020-11-02 VITALS — BP 146/75 | HR 66 | Resp 16 | Ht 67.0 in | Wt 238.0 lb

## 2020-11-02 DIAGNOSIS — Z86718 Personal history of other venous thrombosis and embolism: Secondary | ICD-10-CM

## 2020-11-02 DIAGNOSIS — M5136 Other intervertebral disc degeneration, lumbar region: Secondary | ICD-10-CM

## 2020-11-02 DIAGNOSIS — M19071 Primary osteoarthritis, right ankle and foot: Secondary | ICD-10-CM

## 2020-11-02 DIAGNOSIS — M0609 Rheumatoid arthritis without rheumatoid factor, multiple sites: Secondary | ICD-10-CM

## 2020-11-02 DIAGNOSIS — M722 Plantar fascial fibromatosis: Secondary | ICD-10-CM

## 2020-11-02 DIAGNOSIS — Z96651 Presence of right artificial knee joint: Secondary | ICD-10-CM

## 2020-11-02 DIAGNOSIS — M533 Sacrococcygeal disorders, not elsewhere classified: Secondary | ICD-10-CM

## 2020-11-02 DIAGNOSIS — I480 Paroxysmal atrial fibrillation: Secondary | ICD-10-CM

## 2020-11-02 DIAGNOSIS — M19042 Primary osteoarthritis, left hand: Secondary | ICD-10-CM

## 2020-11-02 DIAGNOSIS — Z170 Estrogen receptor positive status [ER+]: Secondary | ICD-10-CM

## 2020-11-02 DIAGNOSIS — C50111 Malignant neoplasm of central portion of right female breast: Secondary | ICD-10-CM

## 2020-11-02 DIAGNOSIS — M8589 Other specified disorders of bone density and structure, multiple sites: Secondary | ICD-10-CM

## 2020-11-02 DIAGNOSIS — I10 Essential (primary) hypertension: Secondary | ICD-10-CM

## 2020-11-02 DIAGNOSIS — M19072 Primary osteoarthritis, left ankle and foot: Secondary | ICD-10-CM

## 2020-11-02 DIAGNOSIS — M19041 Primary osteoarthritis, right hand: Secondary | ICD-10-CM

## 2020-11-02 DIAGNOSIS — M1120 Other chondrocalcinosis, unspecified site: Secondary | ICD-10-CM

## 2020-11-02 DIAGNOSIS — M5441 Lumbago with sciatica, right side: Secondary | ICD-10-CM

## 2020-11-02 DIAGNOSIS — G8929 Other chronic pain: Secondary | ICD-10-CM

## 2020-11-02 DIAGNOSIS — E7800 Pure hypercholesterolemia, unspecified: Secondary | ICD-10-CM

## 2020-11-02 NOTE — Telephone Encounter (Signed)
Next appointment: 02/17/2020 Patient on Colchicine 0.6 mg daily as needed during flares. She is not currently taking any immunosuppressive agents. Recent Prednisone taper 09/15/2020.   Patient states she is experiencing a lot of pain from her hip to her toes on the right side.  Patient states she had a cortisone injection approximately 3 weeks ago on 10/10/2020.  Patient states it helped for a little while, but the pain has returned. Patient has been using Voltaren Gel. Patient is experiencing pain with walking and feels like the pain is coming from her knee and then shoots down to her ankle and up to her hip. Patient states she has no pain with sitting.  Patient states last night her ankle was swollen and her 4 small toes were throbbing like a toothache. Patient states the pain is from her back to underneath her buttocks and down to her toes on the right side. Patient states she has tried elevating her leg.  Patient thinks it may be coming from her knee. She has tried icing her knee as well with some relief. She has not gotten any relief in her toes. Patient states it is interfering with her sleep. Please advise.

## 2020-11-02 NOTE — Addendum Note (Signed)
Addended by: Earnestine Mealing on: 11/02/2020 03:48 PM   Modules accepted: Orders

## 2020-11-02 NOTE — Telephone Encounter (Signed)
Patient called stating she is experiencing a lot of pain from her hip to her toes on the right side.  Patient states she had a cortisone injection approximately 3 weeks ago.  Patient states it helped for a little while, but the pain has returned.  Patient is experiencing pain with walking and feels like the pain is coming from her knee and then shoots down to her ankle and up to her hip.  Patient states last night her ankle was swollen and her 4 small toes were throbbing like a toothache.  Patient states she had a bone density scan on Monday, 10/31/20 and requested a return call.

## 2020-11-02 NOTE — Telephone Encounter (Signed)
Patient scheduled an appointment for 11/02/2020 at 2:00 pm.

## 2020-11-02 NOTE — Telephone Encounter (Signed)
Please a schedule an appointment.  Tara Cox has open appointments this afternoon.
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
## 2020-11-03 ENCOUNTER — Ambulatory Visit: Payer: Medicare Other | Admitting: Rheumatology

## 2020-11-09 ENCOUNTER — Other Ambulatory Visit: Payer: Self-pay | Admitting: Family Medicine

## 2020-11-09 DIAGNOSIS — M5441 Lumbago with sciatica, right side: Secondary | ICD-10-CM

## 2020-11-16 ENCOUNTER — Other Ambulatory Visit (HOSPITAL_COMMUNITY): Payer: Self-pay | Admitting: Cardiology

## 2020-11-28 ENCOUNTER — Ambulatory Visit
Admission: RE | Admit: 2020-11-28 | Discharge: 2020-11-28 | Disposition: A | Discharge status: Home / Self Care | Payer: Medicare Other | Source: Ambulatory Visit | Attending: Family Medicine | Admitting: Family Medicine

## 2020-11-28 ENCOUNTER — Other Ambulatory Visit: Payer: Self-pay

## 2020-11-28 DIAGNOSIS — M5441 Lumbago with sciatica, right side: Secondary | ICD-10-CM

## 2020-11-30 ENCOUNTER — Other Ambulatory Visit: Payer: Self-pay | Admitting: Oncology

## 2020-12-29 ENCOUNTER — Other Ambulatory Visit: Payer: Self-pay | Admitting: Oncology

## 2021-01-31 ENCOUNTER — Other Ambulatory Visit: Payer: Self-pay | Admitting: *Deleted

## 2021-01-31 MED ORDER — RIVAROXABAN 20 MG PO TABS: 20.0000 mg | ORAL_TABLET | Freq: Every day | ORAL | 3 refills | Status: DC

## 2021-02-01 ENCOUNTER — Other Ambulatory Visit: Payer: Self-pay | Admitting: *Deleted

## 2021-02-10 ENCOUNTER — Encounter: Payer: Self-pay | Admitting: Oncology

## 2021-02-12 ENCOUNTER — Other Ambulatory Visit: Payer: Self-pay | Admitting: Physician Assistant

## 2021-02-12 ENCOUNTER — Other Ambulatory Visit (HOSPITAL_COMMUNITY): Payer: Self-pay | Admitting: Cardiology

## 2021-02-13 NOTE — Telephone Encounter (Signed)
Next Visit: 04/03/2021  Last Visit: 11/02/2020  Last Fill: 05/06/2020  DX: Calcium pyrophosphate deposition disease  Current Dose per office note 11/02/2020: colchicine 0.6 mg 1 tablet daily as needed during flares  Labs: 10/12/2020 Glucose 101, AST 41, ALT 55  Okay to refill Colchicine?

## 2021-02-16 ENCOUNTER — Ambulatory Visit: Payer: Medicare Other | Admitting: Rheumatology

## 2021-03-08 ENCOUNTER — Other Ambulatory Visit (HOSPITAL_COMMUNITY): Payer: Self-pay | Admitting: *Deleted

## 2021-03-08 MED ORDER — METOPROLOL SUCCINATE ER 50 MG PO TB24: 50.0000 mg | ORAL_TABLET | Freq: Every day | ORAL | 0 refills | Status: DC

## 2021-03-16 ENCOUNTER — Telehealth: Payer: Self-pay | Admitting: Hematology and Oncology

## 2021-03-16 NOTE — Telephone Encounter (Signed)
Rescheduled appointment per providers. Patient aware.  ? ?
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
## 2021-03-24 ENCOUNTER — Ambulatory Visit: Payer: Medicare Other | Admitting: Rheumatology

## 2021-03-24 ENCOUNTER — Other Ambulatory Visit: Payer: Self-pay

## 2021-03-24 ENCOUNTER — Encounter: Payer: Self-pay | Admitting: Rheumatology

## 2021-03-24 VITALS — BP 132/80 | HR 78 | Ht 67.0 in | Wt 247.0 lb

## 2021-03-24 DIAGNOSIS — M722 Plantar fascial fibromatosis: Secondary | ICD-10-CM

## 2021-03-24 DIAGNOSIS — M0609 Rheumatoid arthritis without rheumatoid factor, multiple sites: Secondary | ICD-10-CM | POA: Diagnosis not present

## 2021-03-24 DIAGNOSIS — M1120 Other chondrocalcinosis, unspecified site: Secondary | ICD-10-CM

## 2021-03-24 DIAGNOSIS — Z170 Estrogen receptor positive status [ER+]: Secondary | ICD-10-CM

## 2021-03-24 DIAGNOSIS — C50111 Malignant neoplasm of central portion of right female breast: Secondary | ICD-10-CM

## 2021-03-24 DIAGNOSIS — M533 Sacrococcygeal disorders, not elsewhere classified: Secondary | ICD-10-CM

## 2021-03-24 DIAGNOSIS — M5441 Lumbago with sciatica, right side: Secondary | ICD-10-CM

## 2021-03-24 DIAGNOSIS — Z96651 Presence of right artificial knee joint: Secondary | ICD-10-CM

## 2021-03-24 DIAGNOSIS — M19072 Primary osteoarthritis, left ankle and foot: Secondary | ICD-10-CM

## 2021-03-24 DIAGNOSIS — M65341 Trigger finger, right ring finger: Secondary | ICD-10-CM

## 2021-03-24 DIAGNOSIS — M19071 Primary osteoarthritis, right ankle and foot: Secondary | ICD-10-CM

## 2021-03-24 DIAGNOSIS — Z86718 Personal history of other venous thrombosis and embolism: Secondary | ICD-10-CM

## 2021-03-24 DIAGNOSIS — M19042 Primary osteoarthritis, left hand: Secondary | ICD-10-CM

## 2021-03-24 DIAGNOSIS — M5136 Other intervertebral disc degeneration, lumbar region: Secondary | ICD-10-CM

## 2021-03-24 DIAGNOSIS — M8589 Other specified disorders of bone density and structure, multiple sites: Secondary | ICD-10-CM

## 2021-03-24 DIAGNOSIS — I480 Paroxysmal atrial fibrillation: Secondary | ICD-10-CM

## 2021-03-24 DIAGNOSIS — M19041 Primary osteoarthritis, right hand: Secondary | ICD-10-CM

## 2021-03-24 DIAGNOSIS — I10 Essential (primary) hypertension: Secondary | ICD-10-CM

## 2021-03-24 DIAGNOSIS — E7800 Pure hypercholesterolemia, unspecified: Secondary | ICD-10-CM

## 2021-03-24 DIAGNOSIS — G8929 Other chronic pain: Secondary | ICD-10-CM

## 2021-04-03 ENCOUNTER — Ambulatory Visit: Payer: Medicare Other | Admitting: Rheumatology

## 2021-04-05 ENCOUNTER — Other Ambulatory Visit (HOSPITAL_COMMUNITY): Payer: Self-pay | Admitting: Cardiology

## 2021-04-25 ENCOUNTER — Ambulatory Visit (HOSPITAL_COMMUNITY)
Admission: RE | Admit: 2021-04-25 | Discharge: 2021-04-25 | Disposition: A | Discharge status: Home / Self Care | Payer: Medicare Other | Source: Ambulatory Visit | Attending: Cardiology | Admitting: Cardiology

## 2021-04-25 ENCOUNTER — Other Ambulatory Visit (HOSPITAL_COMMUNITY): Payer: Self-pay | Admitting: Cardiology

## 2021-04-25 ENCOUNTER — Encounter (HOSPITAL_COMMUNITY): Payer: Self-pay | Admitting: Cardiology

## 2021-04-25 VITALS — BP 152/80 | HR 85 | Wt 246.6 lb

## 2021-04-25 DIAGNOSIS — Z7901 Long term (current) use of anticoagulants: Secondary | ICD-10-CM | POA: Diagnosis not present

## 2021-04-25 DIAGNOSIS — I480 Paroxysmal atrial fibrillation: Secondary | ICD-10-CM | POA: Insufficient documentation

## 2021-04-25 DIAGNOSIS — E669 Obesity, unspecified: Secondary | ICD-10-CM | POA: Diagnosis not present

## 2021-04-25 DIAGNOSIS — I10 Essential (primary) hypertension: Secondary | ICD-10-CM | POA: Insufficient documentation

## 2021-04-25 DIAGNOSIS — I471 Supraventricular tachycardia: Secondary | ICD-10-CM | POA: Diagnosis not present

## 2021-04-25 DIAGNOSIS — I4891 Unspecified atrial fibrillation: Secondary | ICD-10-CM

## 2021-04-25 DIAGNOSIS — Z86718 Personal history of other venous thrombosis and embolism: Secondary | ICD-10-CM | POA: Diagnosis not present

## 2021-04-25 DIAGNOSIS — Z853 Personal history of malignant neoplasm of breast: Secondary | ICD-10-CM | POA: Diagnosis not present

## 2021-04-25 DIAGNOSIS — Z9011 Acquired absence of right breast and nipple: Secondary | ICD-10-CM | POA: Diagnosis not present

## 2021-04-25 DIAGNOSIS — Z6838 Body mass index (BMI) 38.0-38.9, adult: Secondary | ICD-10-CM | POA: Insufficient documentation

## 2021-04-25 DIAGNOSIS — R0609 Other forms of dyspnea: Secondary | ICD-10-CM | POA: Insufficient documentation

## 2021-04-25 MED ORDER — METOPROLOL SUCCINATE ER 50 MG PO TB24: 75.0000 mg | ORAL_TABLET | Freq: Every day | ORAL | 2 refills | Status: DC

## 2021-04-25 NOTE — Patient Instructions (Signed)
Medication Changes: ? ?Increase Toprol XL to 75 mg (1 1/2 Tab) daily ? ?Lab Work: ? ?none ? ?Testing/Procedures: ? ?Your physician has requested that you have an echocardiogram. Echocardiography is a painless test that uses sound waves to create images of your heart. It provides your doctor with information about the size and shape of your heart and how well your heart?s chambers and valves are working. This procedure takes approximately one hour. There are no restrictions for this procedure. ? ?Your provider has recommended that  you wear a Zio Patch for 7 days.  This monitor will record your heart rhythm for our review.  IF you have any symptoms while wearing the monitor please press the button.  If you have any issues with the patch or you notice a red or orange light on it please call the company at [phone].  Once you remove the patch please mail it back to the company as soon as possible so we can get the results. ? ? ?Referrals: ? ?[facility] Pharmacist for [employer]. They will call you to arrange the appointment ? ?Special Instructions // Education: ? ?none ? ?Follow-Up in: 6 months (October 2023)  **PLEASE CALL THE OFFICE IN AUGUST TO ARRANGE FOLLOW UP APPOINTMENT** ? ?At the [facility], you and your health needs are our priority. We have a designated team specialized in the treatment of Heart Failure. This Care Team includes your primary Heart Failure Specialized Cardiologist (physician), Advanced Practice Providers (APPs- Physician Assistants and Nurse Practitioners), and Pharmacist who all work together to provide you with the care you need, when you need it.  ? ?You may see any of the following providers on your designated Care Team at your next follow up: ? ?Dr Glori Bickers ?Dr Loralie Champagne ?Darrick Grinder, NP ?Lyda Jester, PA ?Jessica Milford,NP ?Marlyce Huge, PA ?Audry Riles, PharmD ? ? ?Please be sure to bring in all your medications bottles to every appointment.  ? ?Need  to Contact us: ? ?If you have any questions or concerns before your next appointment please send [place] a message through [place] or call our office at [phone].   ? ?TO LEAVE A MESSAGE FOR THE NURSE SELECT OPTION 2, PLEASE LEAVE A MESSAGE INCLUDING: ?YOUR NAME ?DATE OF BIRTH ?CALL BACK NUMBER ?REASON FOR CALL**this is important as we prioritize the call backs ? ?YOU WILL RECEIVE A CALL BACK THE SAME DAY AS LONG AS YOU CALL BEFORE 4:00 PM ? ? ?

## 2021-04-25 NOTE — Progress Notes (Signed)
?  ? ?Date:  04/25/2021  ? ?ID:  Tara Cox, DOB 08-30-1934, MRN [id number]   ?Provider location:  Advanced Heart Failure ?Type of Visit: Established patient ? ?PCP:  [name], MD  ?Cardiology: Dr. Aundra Dubin ?  ?History of Present Illness: ?Tara Cox is a 87 y.o. female who has a history of pseudogout, paroxysmal atrial fibrillation, osteoarthritis, and breast cancer.  She was referred initially by Dr. Jana Hakim for cardio-oncology evaluation.   ?  ?She was diagnosed in 3/19 with right breast cancer, ER+/PR+/HER2+.  She had right mastectomy in 4/19 with Herceptin x 1 year starting afterwards.  She completed XRT and Herceptin.  Echo screening while on Herceptin showed no fall in EF.  ?  ?She wore a Zio patch in 8/19 showing a few short runs of SVT, no atrial fibrillation.  ?  ?In 1/20, she developed a LUE DVT. She was switched from warfarin to Xarelto.  ?  ?Last echo in 11/20 showed EF 83-72%, grade 2 diastolic dysfunction, normal RV.   ? ?She returns for followup of palpitations.  She reports feeling tachypalpitations/heart pounding for periods of up to 5 minutes.  She will often notice these periods while lying in the bed. This has been going on for 2-3 months.  No lightheadedness/syncope.  No associated dyspnea or chest pain.  She does get mild dyspnea after walking 100-200 feet, no change from prior. She has been gradually gaining weight.  Exercise limited by sciatica.    ?  ?Labs (10/18): creatinine 0.91 ?Labs (5/20): hgb 12.9, K 3.7, creatinine 0.95 ?Labs (9/20): K 4.1, creatinine 0.96, hgb 15 ?Labs (11/22): LDL 102, K 4.2, creatinine 0.92 ? ?ECG (personally reviewed): NSR with sinus arrhythmia.  ?  ?PMH: ?1. CPPD/pseudogout ?2. GERD ?3. Degenerative disc disease s/p discectomy ?4. Right TKR ?5. HTN ?6. IBS ?7. Hyperlipidemia ?8. Atrial fibrillation: Paroxysmal, she is on warfarin.  ?- Zio patch (8/19): shorts runs SVT, no atrial fibrillation.  ?9. Cardiolite 12/14 was  normal. ?10. Breast cancer: Diagnosed 3/19 with right breast cancer, ER+/PR+/HER2+.  Right mastectomy in 4/19 with Herceptin x 1 year afterwards.  Completed XRT.  ?- Echo (4/19): EF 60-65%, mild focal basal septal hypertrophy, GLS -18.1%, normal RV size and systolic function.  ?- Echo (8/19): EF 60-65%, GLS -90.2%, grade 2 diastolic dysfunction, normal RV size and systolic function, mild MR.  ?- Echo (11/19): EF 60-65%, GLS -19.4%, normal RV size/function ?- Echo (2/20): EF 60-65%, GLS -11.1%, grade 2 diastolic dysfunction, normal RV size and systolic function.  ?- Echo (5/20): EF 60-65%, GLS -23.2%.  ?- Echo (11/20): EF 55-20%, grade 2 diastolic dysfunction, RV normal. ?11. LUE DVT ? ?Current Outpatient Medications  ?Medication Sig Dispense Refill  ? acetaminophen (TYLENOL) 650 MG CR tablet Take 650 mg by mouth every 8 (eight) hours as needed for pain.    ? anastrozole (ARIMIDEX) 1 MG tablet TAKE 1 TABLET BY MOUTH  DAILY 90 tablet 4  ? Ascorbic Acid (VITAMIN C WITH ROSE HIPS) 500 MG tablet Take 500 mg by mouth daily.    ? atorvastatin (LIPITOR) 20 MG tablet Take 20 mg by mouth at bedtime.     ? benzonatate (TESSALON) 200 MG capsule Take 200 mg by mouth 3 (three) times daily as needed for cough.    ? BIOTIN PO Take 1,000 mcg by mouth daily.    ? Calcium Carbonate-Vit D-Min (CALCIUM 600+D3 PLUS MINERALS) 600-800 MG-UNIT TABS Take 1 tablet by mouth at bedtime.    ?  colchicine 0.6 MG tablet TAKE 1 TABLET BY MOUTH  DAILY AS NEEDED AS DIRECTED 90 tablet 0  ? Diclofenac Sodium 1 % CREA Apply topically.    ? diphenhydrAMINE HCl (BENADRYL PO) Take by mouth as needed.    ? furosemide (LASIX) 20 MG tablet Take 20 mg by mouth daily.    ? gabapentin (NEURONTIN) 300 MG capsule Take 300 mg by mouth 2 (two) times daily.     ? hydrocortisone (ANUSOL-HC) 2.5 % rectal cream Place 1 application rectally 2 (two) times daily as needed for hemorrhoids or itching.    ? losartan-hydrochlorothiazide (HYZAAR) 100-25 MG tablet Take 1 tablet  by mouth daily.     ? magnesium oxide (MAG-OX) 400 MG tablet Take 400 mg by mouth daily.     ? Multiple Vitamin (MULTIVITAMIN) tablet Take 1 tablet by mouth daily. CENTRUM    ? nystatin (MYCOSTATIN) 100000 UNIT/ML suspension Take 5 mLs by mouth as needed.    ? Omega-3 Fatty Acids (OMEGA 3 PO) Take 1,000 Units by mouth daily.    ? omeprazole (PRILOSEC) 40 MG capsule Take 40 mg by mouth daily as needed (FOR ACID REFLUX/INDIGESTION).   12  ? potassium chloride SA (K-DUR,KLOR-CON) 20 MEQ tablet Take 10 mEq by mouth daily.    ? rivaroxaban (XARELTO) 20 MG TABS tablet Take 1 tablet (20 mg total) by mouth daily with supper. 90 tablet 3  ? triamcinolone cream (KENALOG) 0.1 % Apply 1 application topically 2 (two) times daily as needed (Vaginal Itching).    ? metoprolol succinate (TOPROL-XL) 50 MG 24 hr tablet Take 1.5 tablets (75 mg total) by mouth daily. Take with or immediately following a meal. 30 tablet 2  ? ?No current facility-administered medications for this encounter.  ? ? ?Allergies:   Oxycodone, Hydrocodone, Percocet [oxycodone-acetaminophen], Soma [carisoprodol], and Ultram [tramadol hcl]  ? ?Social History:  The patient  reports that she quit smoking about 31 years ago. Her smoking use included cigarettes. She has a 20.00 pack-year smoking history. She has never been exposed to tobacco smoke. She has never used smokeless tobacco. She reports that she does not drink alcohol and does not use drugs.  ? ?Family History:  The patient's family history includes Diabetes in her sister and sister; Emphysema in her mother; Heart disease in her father and sister; Other in her mother and sister; Varicose Veins in her mother.  ? ?ROS:  Please see the history of present illness.   All other systems are personally reviewed and negative.  ? ?Exam:   ?BP (!) 152/80   Pulse 85   Wt 111.9 kg (246 lb 9.6 oz)   SpO2 96%   BMI 38.62 kg/m?  ?General: NAD ?Neck: No JVD, no thyromegaly or thyroid nodule.  ?Lungs: Clear to  auscultation bilaterally with normal respiratory effort. ?CV: Nondisplaced PMI.  Heart regular S1/S2, no S3/S4, no murmur.  No peripheral edema.  No carotid bruit.  Normal pedal pulses.  ?Abdomen: Soft, nontender, no hepatosplenomegaly, no distention.  ?Skin: Intact without lesions or rashes.  ?Neurologic: Alert and oriented x 3.  ?Psych: Normal affect. ?Extremities: No clubbing or cyanosis.  ?HEENT: Normal.  ? ?Recent Labs: ?No results found for requested labs within last 8760 hours.  ?Personally reviewed  ? ?Wt Readings from Last 3 Encounters:  ?04/25/21 111.9 kg (246 lb 9.6 oz)  ?03/24/21 112 kg (247 lb)  ?11/02/20 108 kg (238 lb)  ?  ? ? ?ASSESSMENT AND PLAN: ? ?1. Atrial fibrillation: Paroxysmal.  Zio  patch in 8/19 showed a few short runs SVT.  She is having runs of tachypalpitations.  ?- Increase Toprol XL to 75 mg daily.  ?- Continue Xarelto.  ?- I will arrange for her to wear Zio monitor x 1 week to look for AF runs.  ?2. HTN: BP high today. ?- Increasing Toprol XL as above.  ?3. LUE DVT: Continue Xarelto.   ?4. Exertional dyspnea: I will arrange for repeat echo.  ?5. Obesity: I will refer her to pharmacy clinic to see if she can get semaglutide.  ? ?Recommended follow-up:  6 months with APP if echo and monitor are unremarkable.  ? ?Signed, ?Loralie Champagne, MD  ?04/25/2021 ? ?[facility] ?[place] ?[address] ?[facility] ?[place] [address] ?[phone] (office) ?[phone] (fax) ?

## 2021-04-25 NOTE — Progress Notes (Signed)
Zio patch placed onto patient.  All instructions and information reviewed with patient, they verbalize understanding with no questions. 

## 2021-04-26 ENCOUNTER — Telehealth: Payer: Self-pay | Admitting: Pharmacist

## 2021-04-26 NOTE — Telephone Encounter (Signed)
Pt referred by Dr Aundra Dubin to initiate ARW1TY therapy for weight loss. She has [employer] which does not cover any weight loss drugs [employer]ke Shag[employer], [employer]). She does have DM with most recent A1c of 6.5% on 10/2020. This seems to be a newer diagnosis for her and she is currently not on any glucose-lowering agents. Her insurance covers Ozempic at ~$47/month which would be similar to her Xarelto copay. Called pt to discuss and left message. ? ?

## 2021-04-28 NOTE — Telephone Encounter (Addendum)
Spoke with pt. She is hesitant to start Ozempic due to donut hole copay (already on Xarelto). She wishes to think about it and will give me a call back if she'd like to start on Ozempic. ? ?Pt returned call to clinic and states she does not wish to start Ozempic at this time due to cost. ?

## 2021-05-16 ENCOUNTER — Ambulatory Visit (HOSPITAL_COMMUNITY)
Admission: RE | Admit: 2021-05-16 | Discharge: 2021-05-16 | Disposition: A | Discharge status: Home / Self Care | Payer: Medicare Other | Source: Ambulatory Visit | Attending: Cardiology | Admitting: Cardiology

## 2021-05-16 DIAGNOSIS — I480 Paroxysmal atrial fibrillation: Secondary | ICD-10-CM | POA: Diagnosis not present

## 2021-05-16 DIAGNOSIS — I4811 Longstanding persistent atrial fibrillation: Secondary | ICD-10-CM

## 2021-05-16 DIAGNOSIS — I509 Heart failure, unspecified: Secondary | ICD-10-CM | POA: Insufficient documentation

## 2021-05-16 DIAGNOSIS — I110 Hypertensive heart disease with heart failure: Secondary | ICD-10-CM | POA: Insufficient documentation

## 2021-05-17 LAB — ECHOCARDIOGRAM COMPLETE
AR max vel: 2.44 cm2
AV Peak grad: 5.6 mmHg
Ao pk vel: 1.18 m/s
Area-P 1/2: 4.39 cm2
Calc EF: 60.6 %
S' Lateral: 2.4 cm
Single Plane A2C EF: 61.8 %
Single Plane A4C EF: 61.8 %

## 2021-05-31 ENCOUNTER — Other Ambulatory Visit: Payer: Self-pay | Admitting: Hematology and Oncology

## 2021-05-31 DIAGNOSIS — Z1231 Encounter for screening mammogram for malignant neoplasm of breast: Secondary | ICD-10-CM

## 2021-06-13 ENCOUNTER — Other Ambulatory Visit: Payer: Self-pay | Admitting: Oncology

## 2021-06-14 ENCOUNTER — Ambulatory Visit
Admission: RE | Admit: 2021-06-14 | Discharge: 2021-06-14 | Disposition: A | Discharge status: Home / Self Care | Payer: Medicare Other | Source: Ambulatory Visit | Attending: Hematology and Oncology | Admitting: Hematology and Oncology

## 2021-06-14 ENCOUNTER — Ambulatory Visit: Payer: Medicare Other

## 2021-06-14 DIAGNOSIS — Z1231 Encounter for screening mammogram for malignant neoplasm of breast: Secondary | ICD-10-CM

## 2021-06-20 ENCOUNTER — Ambulatory Visit: Payer: Medicare Other | Admitting: Hematology and Oncology

## 2021-06-20 ENCOUNTER — Other Ambulatory Visit: Payer: Medicare Other

## 2021-06-22 ENCOUNTER — Other Ambulatory Visit (HOSPITAL_COMMUNITY): Payer: Self-pay

## 2021-06-22 MED ORDER — METOPROLOL SUCCINATE ER 50 MG PO TB24: 75.0000 mg | ORAL_TABLET | Freq: Every day | ORAL | 3 refills | Status: DC

## 2021-06-23 ENCOUNTER — Other Ambulatory Visit: Payer: Self-pay

## 2021-06-23 DIAGNOSIS — Z170 Estrogen receptor positive status [ER+]: Secondary | ICD-10-CM

## 2021-06-26 ENCOUNTER — Inpatient Hospital Stay: Payer: Medicare Other | Admitting: Hematology and Oncology

## 2021-06-26 ENCOUNTER — Inpatient Hospital Stay: Payer: Medicare Other | Attending: Hematology and Oncology

## 2021-06-26 ENCOUNTER — Other Ambulatory Visit: Payer: Self-pay

## 2021-06-26 ENCOUNTER — Encounter: Payer: Self-pay | Admitting: Hematology and Oncology

## 2021-06-26 VITALS — BP 149/70 | HR 75 | Temp 97.9°F | Resp 16 | Ht 67.0 in | Wt 245.5 lb

## 2021-06-26 DIAGNOSIS — Z9221 Personal history of antineoplastic chemotherapy: Secondary | ICD-10-CM | POA: Insufficient documentation

## 2021-06-26 DIAGNOSIS — Z7901 Long term (current) use of anticoagulants: Secondary | ICD-10-CM | POA: Insufficient documentation

## 2021-06-26 DIAGNOSIS — Z923 Personal history of irradiation: Secondary | ICD-10-CM | POA: Diagnosis not present

## 2021-06-26 DIAGNOSIS — D696 Thrombocytopenia, unspecified: Secondary | ICD-10-CM | POA: Diagnosis not present

## 2021-06-26 DIAGNOSIS — Z9011 Acquired absence of right breast and nipple: Secondary | ICD-10-CM | POA: Diagnosis not present

## 2021-06-26 DIAGNOSIS — Z79811 Long term (current) use of aromatase inhibitors: Secondary | ICD-10-CM | POA: Diagnosis not present

## 2021-06-26 DIAGNOSIS — Z87891 Personal history of nicotine dependence: Secondary | ICD-10-CM | POA: Diagnosis not present

## 2021-06-26 DIAGNOSIS — C50111 Malignant neoplasm of central portion of right female breast: Secondary | ICD-10-CM | POA: Insufficient documentation

## 2021-06-26 DIAGNOSIS — I480 Paroxysmal atrial fibrillation: Secondary | ICD-10-CM | POA: Insufficient documentation

## 2021-06-26 DIAGNOSIS — R7989 Other specified abnormal findings of blood chemistry: Secondary | ICD-10-CM | POA: Diagnosis not present

## 2021-06-26 DIAGNOSIS — Z170 Estrogen receptor positive status [ER+]: Secondary | ICD-10-CM | POA: Diagnosis not present

## 2021-06-26 DIAGNOSIS — R053 Chronic cough: Secondary | ICD-10-CM | POA: Insufficient documentation

## 2021-06-26 DIAGNOSIS — Z86718 Personal history of other venous thrombosis and embolism: Secondary | ICD-10-CM | POA: Insufficient documentation

## 2021-06-26 DIAGNOSIS — M8580 Other specified disorders of bone density and structure, unspecified site: Secondary | ICD-10-CM | POA: Insufficient documentation

## 2021-06-26 LAB — CMP (CANCER CENTER ONLY)
ALT: 71 U/L — ABNORMAL HIGH (ref 0–44)
AST: 58 U/L — ABNORMAL HIGH (ref 15–41)
Albumin: 3.9 g/dL (ref 3.5–5.0)
Alkaline Phosphatase: 104 U/L (ref 38–126)
Anion gap: 5 (ref 5–15)
BUN: 20 mg/dL (ref 8–23)
CO2: 32 mmol/L (ref 22–32)
Calcium: 9.8 mg/dL (ref 8.9–10.3)
Chloride: 104 mmol/L (ref 98–111)
Creatinine: 1.01 mg/dL — ABNORMAL HIGH (ref 0.44–1.00)
GFR, Estimated: 55 mL/min — ABNORMAL LOW (ref >60)
Glucose, Bld: 153 mg/dL — ABNORMAL HIGH (ref 70–99)
Potassium: 3.6 mmol/L (ref 3.5–5.1)
Sodium: 141 mmol/L (ref 135–145)
Total Bilirubin: 0.6 mg/dL (ref 0.3–1.2)
Total Protein: 6.5 g/dL (ref 6.5–8.1)

## 2021-06-26 LAB — CBC WITH DIFFERENTIAL (CANCER CENTER ONLY)
Abs Immature Granulocytes: 0.01 10*3/uL (ref 0.00–0.07)
Basophils Absolute: 0 10*3/uL (ref 0.0–0.1)
Basophils Relative: 1 %
Eosinophils Absolute: 0.1 10*3/uL (ref 0.0–0.5)
Eosinophils Relative: 3 %
HCT: 43.3 % (ref 36.0–46.0)
Hemoglobin: 13.9 g/dL (ref 12.0–15.0)
Immature Granulocytes: 0 %
Lymphocytes Relative: 57 %
Lymphs Abs: 2.7 10*3/uL (ref 0.7–4.0)
MCH: 26.9 pg (ref 26.0–34.0)
MCHC: 32.1 g/dL (ref 30.0–36.0)
MCV: 83.9 fL (ref 80.0–100.0)
Monocytes Absolute: 0.5 10*3/uL (ref 0.1–1.0)
Monocytes Relative: 10 %
Neutro Abs: 1.4 10*3/uL — ABNORMAL LOW (ref 1.7–7.7)
Neutrophils Relative %: 29 %
Platelet Count: 128 10*3/uL — ABNORMAL LOW (ref 150–400)
RBC: 5.16 MIL/uL — ABNORMAL HIGH (ref 3.87–5.11)
RDW: 14.6 % (ref 11.5–15.5)
WBC Count: 4.7 10*3/uL (ref 4.0–10.5)
nRBC: 0 % (ref 0.0–0.2)

## 2021-06-26 NOTE — Progress Notes (Signed)
[place]  [phone] [phone]    ID: Tara Cox DOB: 12/01/1934  MR#: [id number]  WSF#:[id number]  Patient Care Team: [name], MD as PCP - General (Family Medicine) Larey Dresser, MD as PCP - Advanced Heart Failure (Cardiology) Magrinat, Virgie Dad, MD (Inactive) as Consulting Physician (Oncology) Erroll Luna, MD as Consulting Physician (General Surgery) Kyung Rudd, MD as Consulting Physician (Radiation Oncology) Vickey Huger, MD as Consulting Physician (Orthopedic Surgery) Bo Merino, MD as Consulting Physician (Rheumatology) Lorretta Harp, MD as Consulting Physician (Cardiology) OTHER MD:   CHIEF COMPLAINT: Triple positive breast cancer (s/p right mastectomy)  CURRENT TREATMENT: Anastrozole, rivaroxaban   INTERVAL HISTORY:  Tara Cox returns today for follow-up of her triple positive breast cancer.  She continues on anastrozole.  She generally tolerates this well, without unusual hot flashes or vaginal dryness issues. Last mammogram on June 14, 2021 with no mammographic evidence of malignancy. Last bone density from October 2022 showed osteopenia. She says she has been having some bleeding from hemorrhoids when she eats spicy food. She is wondering if xarelto is making it worse. Rest of the pertinent 10 point ROS reviewed and negative.   COVID 19 VACCINATION STATUS: fully vaccinated [employer]), with 4th dose 04/13/2020   HISTORY OF CURRENT ILLNESS: From the original intake note:  Tara Cox had mammography at the breast center October 11, 2011 showing calcifications in the upper right breast.  Six-month follow-up was suggested, but  the patient did not follow-up.  More recently she noticed nipple inversion on the right breast and followed this without reporting it over the last several months. She did undergo bilateral diagnostic mammography with tomography and right breast ultrasonography at [facility] on 03/26/2017 showing: breast density category B. There is a highly suspicious right retroareolar mass  measuring approximately 2.3 x 1.5 x 2.8 cm  with associated calcifications, together the mass and calcifications measure up to 3.3 cm mammographically. There are 2 lymph nodes in the right axilla with borderline thickened cortices, 1 of which measures 1 x 0.9 x 0.6 cm with a cortex thickness of 0.4 mm.  Accordingly on 03/29/2017 she proceeded to biopsy of the right breast and one axillary lymph node.. The pathology from this procedure showed ([id number]): Invasive lobular carcinoma,grade II, E-cadherin negative. The right axillary lymph node was negative for carcinoma. Prognostic indicators significant for: estrogen receptor, 100% positive and progesterone receptor, 100% positive, both with strong staining intensity. Proliferation marker Ki67 at 25%. HER2 amplified with ratios  HER2/CEP17 signals 2.22 and average HER2 copies per cell 3.00  The patient's subsequent history is as detailed below.   PAST MEDICAL HISTORY: Past Medical History:  Diagnosis Date   A-fib [facility])    Arthritis    Breast cancer ([place]) 05/07/2017   Right Breast Cancer   Cancer ([place])    Chondromalacia, patella 12/08/2015   DDD (degenerative disc disease), lumbar 12/08/2015   S/P discectomy    Diaphragmatic hernia 12/08/2015   Dysrhythmia    A-Fib   Edema of lower extremity 06/29/09   Lower Venous Exam- normal. No evidence of thrombus or thrombophlebitis.   Esophagitis 12/08/2015   Frequent urination at night    GERD (gastroesophageal reflux disease)    Hemorrhoids    History of calcium pyrophosphate deposition disease (CPPD) 12/08/2015   History of hiatal hernia    History of miscarriage 12/08/2015   x3   History of total right knee replacement 12/08/2015   Hypertension 02/24/09  Echo-EF 62%; Myocardial Perfusion Study-Normal. No signifcant ischemia noted.   IBS (irritable bowel syndrome)    Migraine     Osteoarthritis of both feet 12/08/2015   Osteoarthritis of both hands 12/08/2015   Personal history of chemotherapy 2019   Right Breast Cancer   Personal history of radiation therapy 2019   Right Breast Cancer   PONV (postoperative nausea and vomiting)    Uterine fibroid 12/08/2015    PAST SURGICAL HISTORY: Past Surgical History:  Procedure Laterality Date   ABDOMINAL HYSTERECTOMY  1974   APPENDECTOMY     BACK SURGERY  2001   [place]   COLONOSCOPY     COLONOSCOPY WITH PROPOFOL N/A 05/28/2014   Procedure: COLONOSCOPY WITH PROPOFOL;  Surgeon: Carol Ada, MD;  Location: [facility];  Service: Endoscopy;  Laterality: N/A;   ESOPHAGOGASTRODUODENOSCOPY (EGD) WITH PROPOFOL N/A 05/28/2014   Procedure: ESOPHAGOGASTRODUODENOSCOPY (EGD) WITH PROPOFOL;  Surgeon: Carol Ada, MD;  Location: [facility];  Service: Endoscopy;  Laterality: N/A;   EXPLORATORY LAPAROTOMY     open procedure   EYE SURGERY Bilateral    cataract removal   MASTECTOMY Left 05/07/2017   PORT-A-CATH REMOVAL Right 07/17/2018   Procedure: PORT REMOVAL;  Surgeon: Erroll Luna, MD;  Location: [facility];  Service: General;  Laterality: Right;   PORTACATH PLACEMENT Right 05/07/2017   Procedure: INSERTION PORT-A-CATH;  Surgeon: Erroll Luna, MD;  Location: [place];  Service: General;  Laterality: Right;   SHOULDER SURGERY Right    SIMPLE MASTECTOMY WITH AXILLARY SENTINEL NODE BIOPSY Right 05/07/2017   Procedure: RIGHT SIMPLE MASTECTOMY WITH AXILLARY SENTINEL NODE BIOPSY;  Surgeon: Erroll Luna, MD;  Location: [facility] [place];  Service: General;  Laterality: Right;   TOTAL KNEE ARTHROPLASTY Right 01/26/2013   DR Ronnie Derby   TOTAL KNEE ARTHROPLASTY Right 01/26/2013   Procedure: TOTAL KNEE ARTHROPLASTY;  Surgeon: Vickey Huger, MD;  Location: [facility];  Service: Orthopedics;  Laterality: Right;   UTERINE FIBROID SURGERY  1967    FAMILY HISTORY Family History  Problem Relation Age of  Onset   Other Mother    Varicose Veins Mother    Emphysema Mother    Heart disease Father        heart attack   Diabetes Sister    Other Sister        pacemaker   Diabetes Sister    Heart disease Sister   The patient's father died in 46 (around age 93) in [place] due to sudden death. The patient's mother died around age 35 due to emphysema. The patient was born in [place], [place]. The patient has 2 brothers and 3 sisters. One sister had a lumpectomy in 1991, but this was not malignant. Another sister died of pancreatic cancer. The patient denies a family history of breast or ovarian cancer.     GYNECOLOGIC HISTORY:  No LMP recorded. Patient has had a hysterectomy. Menarche: 87 years old Age at first live birth: 87 years old She is GXP1.  Her LMP was at age 57 when she underwent hysterectomy, without salpingo-oophorectomy. She was on Premarin for more than 20 years.  She never used oral contraceptives.   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 Her husband, Broadus John, used to be in banking, and he is currently retired. The patient's biological daughter, Clarene Critchley, is a Health and safety inspector at a contact center in [place]. The patient's adopted daughter, Lenna Sciara, is an Social research officer, government  in [place]. The patient's adopted son, Gaspar Bidding, is unemployed in [place]. The patient has 8 grandchildren, one of whom works as a Pharmacist, community.  The patient belongs to [employer].    ADVANCED DIRECTIVES: In the absence of any documentation to the contrary, the patient's spouse is their HCPOA.    HEALTH MAINTENANCE: Social History   Tobacco Use   Smoking status: Former    Packs/day: 1.00    Years: 20.00    Total pack years: 20.00    Types: Cigarettes    Quit date: 02/19/1990    Years since quitting: 31.3    Passive exposure: Never   Smokeless tobacco: Never  Vaping Use   Vaping Use: Never used  Substance Use Topics    Alcohol use: No   Drug use: Never    Colonoscopy: Dr. Benson Norway 2016  PAP:  Bone density:   Allergies  Allergen Reactions   Oxycodone Itching, Nausea And Vomiting and Other (See Comments)    Full body weakness    Hydrocodone Other (See Comments)    Reports they make her feel sick/drowsy Can take in small amounts   Percocet [Oxycodone-Acetaminophen] Itching    Pt says she can take tylenol    Soma [Carisoprodol] Nausea And Vomiting   Ultram [Tramadol Hcl] Nausea And Vomiting    Current Outpatient Medications  Medication Sig Dispense Refill   acetaminophen (TYLENOL) 650 MG CR tablet Take 650 mg by mouth every 8 (eight) hours as needed for pain.     anastrozole (ARIMIDEX) 1 MG tablet TAKE 1 TABLET BY MOUTH  DAILY 90 tablet 4   Ascorbic Acid (VITAMIN C WITH ROSE HIPS) 500 MG tablet Take 500 mg by mouth daily.     atorvastatin (LIPITOR) 20 MG tablet Take 20 mg by mouth at bedtime.      benzonatate (TESSALON) 200 MG capsule Take 200 mg by mouth 3 (three) times daily as needed for cough.     BIOTIN PO Take 1,000 mcg by mouth daily.     Calcium Carbonate-Vit D-Min (CALCIUM 600+D3 PLUS MINERALS) 600-800 MG-UNIT TABS Take 1 tablet by mouth at bedtime.     colchicine 0.6 MG tablet TAKE 1 TABLET BY MOUTH  DAILY AS NEEDED AS DIRECTED 90 tablet 0   Diclofenac Sodium 1 % CREA Apply topically.     diphenhydrAMINE HCl (BENADRYL PO) Take by mouth as needed.     furosemide (LASIX) 20 MG tablet Take 20 mg by mouth daily.     gabapentin (NEURONTIN) 300 MG capsule Take 300 mg by mouth 2 (two) times daily.      hydrocortisone (ANUSOL-HC) 2.5 % rectal cream Place 1 application rectally 2 (two) times daily as needed for hemorrhoids or itching.     losartan-hydrochlorothiazide (HYZAAR) 100-25 MG tablet Take 1 tablet by mouth daily.      magnesium oxide (MAG-OX) 400 MG tablet Take 400 mg by mouth daily.      metoprolol succinate (TOPROL-XL) 50 MG 24 hr tablet Take 1.5 tablets (75 mg total) by mouth daily. Take  with or immediately following a meal. 45 tablet 3   Multiple Vitamin (MULTIVITAMIN) tablet Take 1 tablet by mouth daily. CENTRUM     nystatin (MYCOSTATIN) 100000 UNIT/ML suspension Take 5 mLs by mouth as needed.     Omega-3 Fatty Acids (OMEGA 3 PO) Take 1,000 Units by mouth daily.     omeprazole (PRILOSEC) 40 MG capsule Take 40 mg by mouth daily as needed (FOR ACID  REFLUX/INDIGESTION).   12   potassium chloride SA (K-DUR,KLOR-CON) 20 MEQ tablet Take 10 mEq by mouth daily.     rivaroxaban (XARELTO) 20 MG TABS tablet Take 1 tablet (20 mg total) by mouth daily with supper. 90 tablet 3   triamcinolone cream (KENALOG) 0.1 % Apply 1 application topically 2 (two) times daily as needed (Vaginal Itching).     No current facility-administered medications for this visit.    OBJECTIVE: African-American woman who appears stated age  17:   06/26/21 1117  BP: (!) 149/70  Pulse: 75  Resp: 16  Temp: 97.9 F (36.6 C)  SpO2: 100%     Body mass index is 38.45 kg/m.   Wt Readings from Last 3 Encounters:  06/26/21 245 lb 8 oz (111.4 kg)  04/25/21 246 lb 9.6 oz (111.9 kg)  03/24/21 247 lb (112 kg)  ECOG FS:1 - Symptomatic but completely ambulatory  Physical Exam Constitutional:      Appearance: Normal appearance.  Chest:     Comments: Right breast s.p mastectomy Left breast with inspection. No palpable masses No regional adenopathy. Musculoskeletal:        General: Swelling (Bilateral ankle swelling) present.     Cervical back: Normal range of motion and neck supple. No rigidity.  Lymphadenopathy:     Cervical: No cervical adenopathy.  Neurological:     Mental Status: She is alert.      LAB RESULTS:  CMP     Component Value Date/Time   NA 141 06/26/2021 1028   NA 142 09/22/2015 0000   K 3.6 06/26/2021 1028   CL 104 06/26/2021 1028   CO2 32 06/26/2021 1028   GLUCOSE 153 (H) 06/26/2021 1028   BUN 20 06/26/2021 1028   BUN 19 09/22/2015 0000   CREATININE 1.01 (H) 06/26/2021 1028    CREATININE 0.92 (H) 08/31/2019 1057   CALCIUM 9.8 06/26/2021 1028   PROT 6.5 06/26/2021 1028   ALBUMIN 3.9 06/26/2021 1028   AST 58 (H) 06/26/2021 1028   ALT 71 (H) 06/26/2021 1028   ALKPHOS 104 06/26/2021 1028   BILITOT 0.6 06/26/2021 1028   GFRNONAA 55 (L) 06/26/2021 1028   GFRNONAA 58 (L) 08/31/2019 1057   GFRAA 67 08/31/2019 1057    Lab Results  Component Value Date   ALBUMINELP 4.0 08/31/2019   A1GS 0.3 08/31/2019   A2GS 0.8 08/31/2019   BETS 0.5 08/31/2019   BETA2SER 0.4 08/31/2019   GAMS 0.9 08/31/2019   SPEI  08/31/2019     Comment:     Normal Serum Protein Electrophoresis Pattern. No abnormal protein bands (M-protein) detected.     No results found for: "KPAFRELGTCHN", "LAMBDASER", "KAPLAMBRATIO"  Lab Results  Component Value Date   WBC 4.7 06/26/2021   NEUTROABS 1.4 (L) 06/26/2021   HGB 13.9 06/26/2021   HCT 43.3 06/26/2021   MCV 83.9 06/26/2021   PLT 128 (L) 06/26/2021   No results found for: "LABCA2"  No components found for: "UYQIHK742"  No results for input(s): "INR" in the last 168 hours.  No results found for: "LABCA2"  No results found for: "VZD638"  No results found for: "CAN125"  No results found for: "CAN153"  No results found for: "CA2729"  No components found for: "HGQUANT"  No results found for: "CEA1", "CEA" / No results found for: "CEA1", "CEA"   No results found for: "AFPTUMOR"  No results found for: "CHROMOGRNA"  No results found for: "HGBA", "HGBA2QUANT", "HGBFQUANT", "HGBSQUAN" (Hemoglobinopathy evaluation)   Lab Results  Component Value Date   LDH 162 10/22/2006    No results found for: "IRON", "TIBC", "IRONPCTSAT" (Iron and TIBC)  No results found for: "FERRITIN"  Urinalysis    Component Value Date/Time   COLORURINE YELLOW 09/27/2015 [place] 09/27/2015 1532   LABSPEC 1.015 09/27/2015 1532   PHURINE 6.5 09/27/2015 1532   GLUCOSEU NEGATIVE 09/27/2015 1532   HGBUR TRACE (A) 09/27/2015  1532   BILIRUBINUR NEGATIVE 09/27/2015 1532   KETONESUR NEGATIVE 09/27/2015 1532   PROTEINUR NEGATIVE 09/27/2015 1532   UROBILINOGEN 0.2 01/19/2013 0954   NITRITE NEGATIVE 09/27/2015 1532   LEUKOCYTESUR NEGATIVE 09/27/2015 1532    STUDIES: MM 3D SCREEN BREAST UNI LEFT  Result Date: 06/15/2021 CLINICAL DATA:  Screening. EXAM: DIGITAL SCREENING UNILATERAL LEFT MAMMOGRAM WITH CAD AND TOMOSYNTHESIS TECHNIQUE: Left screening digital craniocaudal and mediolateral oblique mammograms were obtained. Left screening digital breast tomosynthesis was performed. The images were evaluated with computer-aided detection. COMPARISON:  Previous exam(s). ACR Breast Density Category b: There are scattered areas of fibroglandular density. FINDINGS: There are no findings suspicious for malignancy. IMPRESSION: No mammographic evidence of malignancy. A result letter of this screening mammogram will be mailed directly to the patient. RECOMMENDATION: Screening mammogram in one year. (Code:SM-B-01Y) BI-RADS CATEGORY  1: Negative. Electronically Signed   By: Abelardo Diesel M.D.   On: 06/15/2021 09:17  Lab work obtained at [facility] 04/12/2020 shows a sodium of 140 potassium 4.2 chloride 98 CO2 34 which is unchanged from baseline BUN 20, glucose 109, creatinine 0.95, calcium 10.4 albumin 4.3 total bilirubin 0.9 alkaline phosphatase 117, AST 29 ALT 34, total protein 7.2 and a white cell count of 5.0 hemoglobin 14.4 MCV 83.5 and platelets 174,000.  ELIGIBLE FOR AVAILABLE RESEARCH PROTOCOL: Exact sciences study   ASSESSMENT: 87 y.o. [place] woman status post central right breast biopsy 03/29/2017 for a clinical T1c N0, stage IA invasive lobular carcinoma, grade 2, strongly estrogen and progesterone receptor positive, also HER-2 amplified, with an MIB-1 of 25%  (1) status post right mastectomy with sentinel lymph node sampling 05/07/2017 for a pT3 pN1, stage IIB invasive lobular carcinoma, grade 3, with negative  margins.  (2) adjuvant radiation 07/02/2017 - 08/16/2017  Site/dose:   The patient initially received a dose of 50.4 Gy in 28 fractions to the right chest wall and supraclavicular region. This was delivered using a 3-D conformal, 4 field technique. The patient then received a boost to the mastectomy scar. This delivered an additional 10 Gy in 5 fractions using an en face electron field. The total dose was 60.4 Gy.   (3) trastuzumab adjuvantly for 1 year started 06/04/2017, to be completed 05/13/2018  (a) echocardiogram 04/16/2017 shows an ejection fraction of 60-65%.  (b) echocardiogram 08/21/2017 shows an ejection fraction in the 60-65% range  (c) echo 11/22/2017 shows an ejection fraction in the 60-65% range  (d) echocardiogram 02/27/2018 showed an ejection fraction in the 60-65% range.  (4) anastrozole started 05/28/2017  (a) bone density 10/31/2017 shows a T score of -1.5  (5) small seroma right axilla  (6) history of paroxysmal atrial fibrillation, on lifelong anticoagulation   (a) left jugular and subclavian DVT noted on 01/17/2018 while taking Warfarin   (a) started Enoxaparin 147m daily January 2020, switched to rivaroxaban February 2020   PLAN:  She takes anastrozole with good tolerance and the plan is to continue that a total of 5 years. She has been tolerating it well, last mammogram June 2023, negative in left breast. She has  noticed some hemorrhoidal bleeding , likely worsening with xarelto Physical exam today Ok to continue anastrozole till May 2023. With regards to bone density, she has osteopenia, she takes ca/vit D supplementation. We also discussed about staying active and regular exercise. She is not interested in taking bisphosphonate at this time. She was encouraged to talk to her PCP and cardiology about her other medical issues Chronic cough, no change, could be related to post nasal drip or losartan. She was encouraged to contact us if this changes. She expressed  understanding Mild thrombocytopenia, continue to monitor Elevated LFT's, mild, will continue to monitor.  Total time spent: 30 minutes.  *Total Encounter Time as defined by the Centers for Medicare and Medicaid Services includes, in addition to the face-to-face time of a patient visit (documented in the note above) non-face-to-face time: obtaining and reviewing outside history, ordering and reviewing medications, tests or procedures, care coordination (communications with other health care professionals or caregivers) and documentation in the medical record.

## 2021-08-09 ENCOUNTER — Other Ambulatory Visit: Payer: Self-pay | Admitting: *Deleted

## 2021-08-09 MED ORDER — ANASTROZOLE 1 MG PO TABS: 1.0000 mg | ORAL_TABLET | Freq: Every day | ORAL | 4 refills | Status: DC

## 2021-09-12 NOTE — Progress Notes (Unsigned)
Office Visit Note  Patient: Tara Cox             Date of Birth: Oct 21, 1934           MRN: [id number]             PCP: [name], MD Referring: [name], MD Visit Date: 09/25/2021 Occupation: '@GUAROCC'$ @  Subjective:  Intermittent arthralgias   History of Present Illness: Tara Cox is a 87 y.o. female with history of seronegative rheumatoid arthritis, CPPD, and osteoarthritis.  She is not currently taking any immunosuppressive agents. She takes colchicine 0.6 mg 1 tablet daily as needed during pseudogout flares.  She has not needed to take colchicine in over 1 month. She has intermittent pain in both hands and both knee joints but denies any joint swelling. Her right knee replacement causes occasional discomfort.  She uses voltaren gel topically as needed for pain relief and takes tylenol sparingly for pain relief.     Activities of Daily Living:  Patient reports morning stiffness for 0 minutes.   Patient Denies nocturnal pain.  Difficulty dressing/grooming: Denies Difficulty climbing stairs: Denies Difficulty getting out of chair: Reports Difficulty using hands for taps, buttons, cutlery, and/or writing: Denies  Review of Systems  Constitutional:  Negative for fatigue.  HENT:  Positive for mouth dryness. Negative for mouth sores.   Eyes:  Negative for dryness.  Respiratory:  Negative for shortness of breath.   Cardiovascular:  Negative for chest pain and palpitations.  Gastrointestinal:  Negative for blood in stool, constipation and diarrhea.  Endocrine: Negative for increased urination.  Genitourinary:  Negative for involuntary urination.  Musculoskeletal:  Positive for joint pain, joint pain, joint swelling, myalgias, muscle tenderness and myalgias. Negative for gait problem, muscle weakness and morning stiffness.  Skin:  Positive for rash. Negative for color change, hair loss and sensitivity to sunlight.  Allergic/Immunologic: Negative for  susceptible to infections.  Neurological:  Positive for numbness. Negative for dizziness and headaches.  Hematological:  Positive for bruising/bleeding tendency. Negative for swollen glands.  Psychiatric/Behavioral:  Negative for depressed mood and sleep disturbance. The patient is nervous/anxious.     PMFS History:  Patient Active Problem List   Diagnosis Date Noted   Aortic atherosclerosis ([place]) 04/01/2018   Chronic deep vein thrombosis (DVT) of left upper extremity ([place]) 01/17/2018   Rheumatoid arthritis of multiple sites with negative rheumatoid factor ([place]) 11/13/2017   Port-A-Cath in place 07/24/2017   Breast cancer, stage 2, right ([place]) 05/07/2017   Malignant neoplasm of central portion of right breast in female, estrogen receptor positive ([place]) 04/02/2017   Primary insomnia 09/27/2016   ANA positive 09/27/2016   Complete tear of right rotator cuff 05/11/2016   High risk medication use 05/07/2016   Uterine fibroid 12/08/2015   History of miscarriage 12/08/2015   Esophagitis 12/08/2015   Diaphragmatic hernia 12/08/2015   Calcium pyrophosphate deposition disease 12/08/2015   Osteoarthritis of both hands 12/08/2015   Osteoarthritis of both feet 12/08/2015   History of total right knee replacement 12/08/2015   Chondromalacia of both patellae 12/08/2015   DDD (degenerative disc disease), lumbar 12/08/2015   Essential hypertension 06/26/2013   Paroxysmal atrial fibrillation ([place]) 06/26/2013   Hyperlipidemia 06/26/2013   DJD (degenerative joint disease) of knee 01/26/2013   Hemorrhoids 07/11/2010   IBS (irritable bowel syndrome) 07/11/2010   Fatigue/loss of sleep 07/11/2010   Night sweats 07/11/2010   Chills 07/11/2010   Weight gain 07/11/2010   Inflammatory arthritis 07/11/2010  Incontinence of urine 07/11/2010   Thrombosed external hemorrhoids 07/11/2010    Past Medical History:  Diagnosis Date   A-fib [facility])    Arthritis    Breast cancer ([place]) 05/07/2017   Right  Breast Cancer   Cancer ([place])    Chondromalacia, patella 12/08/2015   DDD (degenerative disc disease), lumbar 12/08/2015   S/P discectomy    Diaphragmatic hernia 12/08/2015   Dysrhythmia    A-Fib   Edema of lower extremity 06/29/09   Lower Venous Exam- normal. No evidence of thrombus or thrombophlebitis.   Esophagitis 12/08/2015   Frequent urination at night    GERD (gastroesophageal reflux disease)    Hemorrhoids    History of calcium pyrophosphate deposition disease (CPPD) 12/08/2015   History of hiatal hernia    History of miscarriage 12/08/2015   x3   History of total right knee replacement 12/08/2015   Hypertension 02/24/09   Echo-EF 62%; Myocardial Perfusion Study-Normal. No signifcant ischemia noted.   IBS (irritable bowel syndrome)    Migraine    Osteoarthritis of both feet 12/08/2015   Osteoarthritis of both hands 12/08/2015   Personal history of chemotherapy 2019   Right Breast Cancer   Personal history of radiation therapy 2019   Right Breast Cancer   PONV (postoperative nausea and vomiting)    Uterine fibroid 12/08/2015    Family History  Problem Relation Age of Onset   Other Mother    Varicose Veins Mother    Emphysema Mother    Heart disease Father        heart attack   Diabetes Sister    Other Sister        pacemaker   Diabetes Sister    Heart disease Sister    Past Surgical History:  Procedure Laterality Date   ABDOMINAL HYSTERECTOMY  1974   APPENDECTOMY     BACK SURGERY  2001   [place]   COLONOSCOPY     COLONOSCOPY WITH PROPOFOL N/A 05/28/2014   Procedure: COLONOSCOPY WITH PROPOFOL;  Surgeon: Carol Ada, MD;  Location: [facility];  Service: Endoscopy;  Laterality: N/A;   ESOPHAGOGASTRODUODENOSCOPY (EGD) WITH PROPOFOL N/A 05/28/2014   Procedure: ESOPHAGOGASTRODUODENOSCOPY (EGD) WITH PROPOFOL;  Surgeon: Carol Ada, MD;  Location: [facility];  Service: Endoscopy;  Laterality: N/A;   EXPLORATORY LAPAROTOMY      open procedure   EYE SURGERY Bilateral    cataract removal   MASTECTOMY Left 05/07/2017   PORT-A-CATH REMOVAL Right 07/17/2018   Procedure: PORT REMOVAL;  Surgeon: Erroll Luna, MD;  Location: [place];  Service: General;  Laterality: Right;   PORTACATH PLACEMENT Right 05/07/2017   Procedure: INSERTION PORT-A-CATH;  Surgeon: Erroll Luna, MD;  Location: [place];  Service: General;  Laterality: Right;   SHOULDER SURGERY Right    SIMPLE MASTECTOMY WITH AXILLARY SENTINEL NODE BIOPSY Right 05/07/2017   Procedure: RIGHT SIMPLE MASTECTOMY WITH AXILLARY SENTINEL NODE BIOPSY;  Surgeon: Erroll Luna, MD;  Location: [place];  Service: General;  Laterality: Right;   TOTAL KNEE ARTHROPLASTY Right 01/26/2013   DR Ronnie Derby   TOTAL KNEE ARTHROPLASTY Right 01/26/2013   Procedure: TOTAL KNEE ARTHROPLASTY;  Surgeon: Vickey Huger, MD;  Location: [place];  Service: Orthopedics;  Laterality: Right;   [place]   Social History   Social History Narrative   [phone] Unable to ask abuse questions husband with her today.   Immunization History  Administered Date(s) Administered   Influenza, High  Dose Seasonal PF 10/07/2015, 10/19/2016, 10/25/2017   PFIZER Comirnaty(Gray Top)Covid-19 Tri-Sucrose Vaccine 04/13/2020   PFIZER(Purple Top)SARS-COV-2 Vaccination 01/25/2019, 02/13/2019, 09/15/2019   Pfizer Covid-19 Vaccine Bivalent Booster 71yr & up 09/27/2020   Pneumococcal Conjugate-13 11/24/2013   Pneumococcal Polysaccharide-23 01/10/2003   Tdap 03/21/2017, 03/21/2017   Zoster Recombinat (Shingrix) 03/21/2017, 03/21/2017   Zoster, Live 08/01/2012     Objective: Vital Signs: BP (!) 153/84 (BP Location: Left Arm, Patient Position: Sitting, Cuff Size: Large)   Pulse (!) 59   Resp 17   Ht '5\' 7"'$  (1.702 m)   Wt 240 lb 6.4 oz (109 kg)   BMI 37.65 kg/m    Physical Exam Vitals and nursing note reviewed.  Constitutional:      Appearance: She is well-developed.  HENT:      Head: Normocephalic and atraumatic.  Eyes:     Conjunctiva/sclera: Conjunctivae normal.  Cardiovascular:     Rate and Rhythm: Normal rate and regular rhythm.     Heart sounds: Normal heart sounds.  Pulmonary:     Effort: Pulmonary effort is normal.     Breath sounds: Normal breath sounds.  Abdominal:     General: Bowel sounds are normal.     Palpations: Abdomen is soft.  Musculoskeletal:     Cervical back: Normal range of motion.  Skin:    General: Skin is warm and dry.     Capillary Refill: Capillary refill takes less than 2 seconds.  Neurological:     Mental Status: She is alert and oriented to person, place, and time.  Psychiatric:        Behavior: Behavior normal.      Musculoskeletal Exam: C-spine limited ROM with lateral rotation.   Shoulder joints have good ROM.  Elbow joints, wrist joints, MCPs, PIPs, and DIPs good ROM with no synovitis.  Complete fist formation bilaterally.  Limited ROM of both hip joints but no groin pain.  Right knee replacement has good ROM.  Left knee has good ROM with no warmth or effusion.  Ankle joints have good ROM with no tenderness or joint swelling.   CDAI Exam: CDAI Score: 0.4  Patient Global: 2 mm; Provider Global: 2 mm Swollen: 0 ; Tender: 0  Joint Exam 09/25/2021   No joint exam has been documented for this visit   There is currently no information documented on the homunculus. Go to the Rheumatology activity and complete the homunculus joint exam.  Investigation: No additional findings.  Imaging: No results found.  Recent Labs: Lab Results  Component Value Date   WBC 4.7 06/26/2021   HGB 13.9 06/26/2021   PLT 128 (L) 06/26/2021   NA 141 06/26/2021   K 3.6 06/26/2021   CL 104 06/26/2021   CO2 32 06/26/2021   GLUCOSE 153 (H) 06/26/2021   BUN 20 06/26/2021   CREATININE 1.01 (H) 06/26/2021   BILITOT 0.6 06/26/2021   ALKPHOS 104 06/26/2021   AST 58 (H) 06/26/2021   ALT 71 (H) 06/26/2021   PROT 6.5 06/26/2021   ALBUMIN  3.9 06/26/2021   CALCIUM 9.8 06/26/2021   GFRAA 67 08/31/2019   QFTBGOLDPLUS NEGATIVE 08/31/2019    Speciality Comments: PLQ eye exam: 08/21/18 normal. Dr. GKaty Fitch Follow up in 1 year.  Procedures:  No procedures performed Allergies: Oxycodone, Hydrocodone, Percocet [oxycodone-acetaminophen], Soma [carisoprodol], and Ultram [tramadol hcl]    Assessment / Plan:     Visit Diagnoses: Rheumatoid arthritis of multiple sites with negative rheumatoid factor ([place] - In remission.  She has been  off hydroxychloroquine since August 2021.  She is not currently taking any immunosuppressive agents.  She has no synovitis on examination today.  She has not had any signs or symptoms of a rheumatoid arthritis flare.  She continues to experience occasional discomfort in her hands and knee joints due to underlying osteoarthritis and discomfort in her right knee replacement.  She uses Voltaren gel or takes Tylenol as needed for pain relief.  Overall her rheumatoid arthritis remains in remission.  She does not require immunosuppressive therapy at this time.  She is advised to notify us if she develops increased joint pain or joint swelling.  She will follow-up in the office in 6 months or sooner if needed.  Calcium pyrophosphate deposition disease - She takes colchicine 0.6 mg 1 tablet daily as needed during flares.  She has not had any recent flares.  She has not needed to take colchicine in over 1 month.  Two samples (7 day supplies each) of colchicine were provided to the patient today. CBC and CMP updated on 06/26/21.  She will be having updated lab work with her wellness check soon.  Primary osteoarthritis of both hands: She has PIP and DIP thickening consistent with osteoarthritis of both hands.  CMC joint prominence noted bilaterally.  Discussed the importance of joint protection and muscle strengthening.  She continues to experience intermittent pain and stiffness in both hands and uses Voltaren gel topically as  needed or takes Tylenol as needed for pain relief.  Trigger finger, right ring finger - Cortisone injection x2.  She is not experiencing any tenderness or locking at this time.  Primary osteoarthritis of both feet: She is not experiencing any increased discomfort in her feet at this time.  She has good range of motion of both ankle joints with no tenderness or synovitis.  She is wearing proper fitting shoes.  Plantar fasciitis, bilateral: Resolved.  She is wearing proper fitting shoes.   History of total right knee replacement: Doing well overall.  She has good ROM with no warmth or effusion.   DDD (degenerative disc disease), lumbar - X-rays of the lumbar spine were updated on 09/15/2020: Findings consistent with levoscoliosis, mild spondylosis, and facet joint arthropathy.   She experiences intermittent discomfort in her lower back.   Chronic right SI joint pain - X-rays of the pelvis were obtained on 09/15/2020 which were unremarkable/   Osteopenia of multiple sites - DEXA updated on 10/31/2020:The BMD measured at Forearm Radius 33% is 0.671 g/cm2 with a T-score of -2.4. Due to update  DEXA in October 2024.   She is taking a calcium and vitamin D supplement daily.   Other medical conditions are listed as follows:   Pure hypercholesterolemia  Essential hypertension  Paroxysmal atrial fibrillation (HCC)  History of DVT (deep vein thrombosis)  Malignant neoplasm of central portion of right breast in female, estrogen receptor positive ([place])  Orders: No orders of the defined types were placed in this encounter.  No orders of the defined types were placed in this encounter.    Follow-Up Instructions: Return in about 6 months (around 03/26/2022) for Rheumatoid arthritis, Osteoarthritis.   Ofilia Neas, PA-C  Note - This record has been created using Dragon software.  Chart creation errors have been sought, but may not always  have been located. Such creation errors do not reflect on   the standard of medical care.

## 2021-09-25 ENCOUNTER — Ambulatory Visit: Payer: Medicare Other | Attending: Physician Assistant | Admitting: Physician Assistant

## 2021-09-25 ENCOUNTER — Other Ambulatory Visit (HOSPITAL_BASED_OUTPATIENT_CLINIC_OR_DEPARTMENT_OTHER): Payer: Self-pay

## 2021-09-25 ENCOUNTER — Encounter: Payer: Self-pay | Admitting: Physician Assistant

## 2021-09-25 VITALS — BP 153/84 | HR 59 | Resp 17 | Ht 67.0 in | Wt 240.4 lb

## 2021-09-25 DIAGNOSIS — G8929 Other chronic pain: Secondary | ICD-10-CM

## 2021-09-25 DIAGNOSIS — Z170 Estrogen receptor positive status [ER+]: Secondary | ICD-10-CM

## 2021-09-25 DIAGNOSIS — M8589 Other specified disorders of bone density and structure, multiple sites: Secondary | ICD-10-CM

## 2021-09-25 DIAGNOSIS — C50111 Malignant neoplasm of central portion of right female breast: Secondary | ICD-10-CM

## 2021-09-25 DIAGNOSIS — M19071 Primary osteoarthritis, right ankle and foot: Secondary | ICD-10-CM

## 2021-09-25 DIAGNOSIS — Z86718 Personal history of other venous thrombosis and embolism: Secondary | ICD-10-CM

## 2021-09-25 DIAGNOSIS — M1120 Other chondrocalcinosis, unspecified site: Secondary | ICD-10-CM

## 2021-09-25 DIAGNOSIS — M65341 Trigger finger, right ring finger: Secondary | ICD-10-CM | POA: Diagnosis not present

## 2021-09-25 DIAGNOSIS — I480 Paroxysmal atrial fibrillation: Secondary | ICD-10-CM

## 2021-09-25 DIAGNOSIS — M533 Sacrococcygeal disorders, not elsewhere classified: Secondary | ICD-10-CM

## 2021-09-25 DIAGNOSIS — Z96651 Presence of right artificial knee joint: Secondary | ICD-10-CM

## 2021-09-25 DIAGNOSIS — M19042 Primary osteoarthritis, left hand: Secondary | ICD-10-CM

## 2021-09-25 DIAGNOSIS — I10 Essential (primary) hypertension: Secondary | ICD-10-CM

## 2021-09-25 DIAGNOSIS — M19072 Primary osteoarthritis, left ankle and foot: Secondary | ICD-10-CM

## 2021-09-25 DIAGNOSIS — M19041 Primary osteoarthritis, right hand: Secondary | ICD-10-CM

## 2021-09-25 DIAGNOSIS — M722 Plantar fascial fibromatosis: Secondary | ICD-10-CM

## 2021-09-25 DIAGNOSIS — M5136 Other intervertebral disc degeneration, lumbar region: Secondary | ICD-10-CM

## 2021-09-25 DIAGNOSIS — M0609 Rheumatoid arthritis without rheumatoid factor, multiple sites: Secondary | ICD-10-CM | POA: Diagnosis not present

## 2021-09-25 DIAGNOSIS — E7800 Pure hypercholesterolemia, unspecified: Secondary | ICD-10-CM

## 2021-09-25 MED ORDER — FLUAD QUADRIVALENT 0.5 ML IM PRSY
PREFILLED_SYRINGE | INTRAMUSCULAR | 0 refills | Status: DC
  Filled 2021-09-25: qty 0.5, 1d supply, fill #0

## 2021-09-25 NOTE — Progress Notes (Signed)
Medication Samples have been provided to the patient.  Drug name: mitigare       Strength: 0.'6mg'$         Qty: 2  LOT: [id number]  Exp.Date: 11/07/2021  Dosing instructions: Take 1 capsule by mouth daily as needed.

## 2021-10-13 ENCOUNTER — Other Ambulatory Visit (HOSPITAL_COMMUNITY): Payer: Self-pay | Admitting: Cardiology

## 2021-10-18 ENCOUNTER — Other Ambulatory Visit (HOSPITAL_BASED_OUTPATIENT_CLINIC_OR_DEPARTMENT_OTHER): Payer: Self-pay

## 2021-10-18 ENCOUNTER — Encounter: Payer: Self-pay | Admitting: Oncology

## 2021-10-18 MED ORDER — COVID-19 MRNA 2023-2024 VACCINE (COMIRNATY) 0.3 ML INJECTION
INTRAMUSCULAR | 0 refills | Status: DC
  Filled 2021-10-18: qty 0.3, 1d supply, fill #0

## 2021-10-23 ENCOUNTER — Other Ambulatory Visit: Payer: Self-pay | Admitting: Physician Assistant

## 2021-10-23 DIAGNOSIS — R918 Other nonspecific abnormal finding of lung field: Secondary | ICD-10-CM

## 2021-10-30 ENCOUNTER — Encounter (HOSPITAL_COMMUNITY): Payer: Self-pay

## 2021-10-30 ENCOUNTER — Ambulatory Visit (HOSPITAL_COMMUNITY)
Admission: RE | Admit: 2021-10-30 | Discharge: 2021-10-30 | Disposition: A | Discharge status: Home / Self Care | Payer: Medicare Other | Source: Ambulatory Visit | Attending: Family Medicine | Admitting: Family Medicine

## 2021-10-30 VITALS — BP 128/80 | HR 68 | Wt 239.0 lb

## 2021-10-30 DIAGNOSIS — M179 Osteoarthritis of knee, unspecified: Secondary | ICD-10-CM | POA: Insufficient documentation

## 2021-10-30 DIAGNOSIS — I471 Supraventricular tachycardia, unspecified: Secondary | ICD-10-CM | POA: Diagnosis not present

## 2021-10-30 DIAGNOSIS — Z86718 Personal history of other venous thrombosis and embolism: Secondary | ICD-10-CM

## 2021-10-30 DIAGNOSIS — I10 Essential (primary) hypertension: Secondary | ICD-10-CM

## 2021-10-30 DIAGNOSIS — R002 Palpitations: Secondary | ICD-10-CM | POA: Diagnosis present

## 2021-10-30 DIAGNOSIS — E669 Obesity, unspecified: Secondary | ICD-10-CM | POA: Diagnosis not present

## 2021-10-30 DIAGNOSIS — I480 Paroxysmal atrial fibrillation: Secondary | ICD-10-CM

## 2021-10-30 DIAGNOSIS — Z7901 Long term (current) use of anticoagulants: Secondary | ICD-10-CM | POA: Insufficient documentation

## 2021-10-30 DIAGNOSIS — I110 Hypertensive heart disease with heart failure: Secondary | ICD-10-CM | POA: Diagnosis not present

## 2021-10-30 DIAGNOSIS — I509 Heart failure, unspecified: Secondary | ICD-10-CM | POA: Insufficient documentation

## 2021-10-30 DIAGNOSIS — Z6837 Body mass index (BMI) 37.0-37.9, adult: Secondary | ICD-10-CM | POA: Diagnosis not present

## 2021-10-30 DIAGNOSIS — R0683 Snoring: Secondary | ICD-10-CM | POA: Diagnosis not present

## 2021-10-30 NOTE — Progress Notes (Signed)
Date:  10/30/2021   ID:  Tara Cox, DOB 1934-06-10, MRN [id number]   Provider location: [place] [facility] Type of Visit: Established patient  PCP:  Tara Sacramento, MD  Cardiology: Dr. Aundra Cox   History of Present Illness: Tara Cox is a 87 y.o. female who has a history of pseudogout, paroxysmal atrial fibrillation, osteoarthritis, and breast cancer.  She was referred initially by Dr. Jana Cox for cardio-oncology evaluation.     She was diagnosed in 3/19 with right breast cancer, ER+/PR+/HER2+.  She had right mastectomy in 4/19 with Herceptin x 1 year starting afterwards.  She completed XRT and Herceptin.  Echo screening while on Herceptin showed no fall in EF.    She wore a Zio patch in 8/19 showing a few short runs of SVT, no atrial fibrillation.    In 1/20, she developed a LUE DVT. She was switched from warfarin to Xarelto.    Echo in 11/20 showed EF 41-66%, grade 2 diastolic dysfunction, normal RV.    Follow up 4/23, continued with tachypalpitations. Zio ordered and arranged for repeat echo.  Zio 7 day (5/23) showed mostly NSR, occasional PACs, occasional short SVT runs, no atrial fibrillation. Echo 5/23 showed EF 60-65%, mild LVH, RV normal.  Today she returns for HF follow up. Overall feeling fine. She is not short of breath with ADLs or walking on flat ground. Limited mostly by knee OA. Continues with occasional palpitations at night time. This is not bothersome to her. Denies abnormal bleeding, CP, dizziness, edema, or PND/Orthopnea. Appetite ok. No fever or chills. Weight at home 240 pounds. Taking all medications. Had recent CXR at PCP visit, has follow up CT to evaluate for possible RUL scarring.   Labs (10/18): creatinine 0.91 Labs (5/20): hgb 12.9, K 3.7, creatinine 0.95 Labs (9/20): K 4.1, creatinine 0.96, hgb 15 Labs (11/22): LDL 102, K 4.2, creatinine 0.92 Labs (6/23): K 3.6, creatinine 1.01, GFR 55, Hgb 13.9 Labs (10/23):  K 4.5, creatinine 0.93, LDL 42, HDL 42  PMH: 1. CPPD/pseudogout 2. GERD 3. Degenerative disc disease s/p discectomy 4. Right TKR 5. HTN 6. IBS 7. Hyperlipidemia 8. Atrial fibrillation: Paroxysmal, she is on warfarin.  - Zio patch (8/19): shorts runs SVT, no atrial fibrillation.  - Zio 7 day (5/23) showed mostly NSR, occasional PACs, occasional short SVT runs, no atrial fibrillation. 9. Cardiolite 12/14 was normal. 10. Breast cancer: Diagnosed 3/19 with right breast cancer, ER+/PR+/HER2+.  Right mastectomy in 4/19 with Herceptin x 1 year afterwards.  Completed XRT.  - Echo (4/19): EF 60-65%, mild focal basal septal hypertrophy, GLS -18.1%, normal RV size and systolic function.  - Echo (8/19): EF 60-65%, GLS -06.3%, grade 2 diastolic dysfunction, normal RV size and systolic function, mild MR.  - Echo (11/19): EF 60-65%, GLS -19.4%, normal RV size/function - Echo (2/20): EF 60-65%, GLS -01.6%, grade 2 diastolic dysfunction, normal RV size and systolic function.  - Echo (5/20): EF 60-65%, GLS -23.2%.  - Echo (11/20): EF 01-09%, grade 2 diastolic dysfunction, RV normal. - Echo (5/23): EF 60-65%, mild LVH, RV normal. 11. LUE DVT  Current Outpatient Medications  Medication Sig Dispense Refill   acetaminophen (TYLENOL) 650 MG CR tablet Take 650 mg by mouth every 8 (eight) hours as needed for pain.     anastrozole (ARIMIDEX) 1 MG tablet Take 1 tablet (1 mg total) by mouth daily. 90 tablet 4   Ascorbic Acid (VITAMIN C WITH ROSE HIPS) 500 MG tablet Take  500 mg by mouth daily.     atorvastatin (LIPITOR) 20 MG tablet Take 20 mg by mouth at bedtime.      benzonatate (TESSALON) 200 MG capsule Take 200 mg by mouth 3 (three) times daily as needed for cough.     BIOTIN PO Take 1,000 mcg by mouth daily.     Calcium Carbonate-Vit D-Min (CALCIUM 600+D3 PLUS MINERALS) 600-800 MG-UNIT TABS Take 1 tablet by mouth at bedtime.     cetirizine (ZYRTEC) 10 MG tablet Take 10 mg by mouth as needed for allergies.      colchicine 0.6 MG tablet TAKE 1 TABLET BY MOUTH  DAILY AS NEEDED AS DIRECTED 90 tablet 0   COVID-19 mRNA vaccine 2023-2024 (COMIRNATY) SUSP injection Inject into the muscle. 0.3 mL 0   Diclofenac Sodium 1 % CREA Apply topically. As needed     diphenhydrAMINE HCl (BENADRYL PO) Take by mouth as needed.     furosemide (LASIX) 20 MG tablet Take 20 mg by mouth daily.     gabapentin (NEURONTIN) 300 MG capsule Take 300 mg by mouth 2 (two) times daily.      hydrocortisone (ANUSOL-HC) 2.5 % rectal cream Place 1 application rectally 2 (two) times daily as needed for hemorrhoids or itching.     influenza vaccine adjuvanted (FLUAD QUADRIVALENT) 0.5 ML injection Inject into the muscle. 0.5 mL 0   losartan-hydrochlorothiazide (HYZAAR) 100-25 MG tablet Take 1 tablet by mouth daily.      magnesium oxide (MAG-OX) 400 MG tablet Take 400 mg by mouth daily.      metoprolol succinate (TOPROL-XL) 50 MG 24 hr tablet Patient takes 1.5 tablet by mouth once a day with meals.     Multiple Vitamin (MULTIVITAMIN) tablet Take 1 tablet by mouth daily. CENTRUM     nystatin (MYCOSTATIN) 100000 UNIT/ML suspension Take 5 mLs by mouth as needed.     Omega-3 Fatty Acids (OMEGA 3 PO) Take 1,000 Units by mouth daily.     omeprazole (PRILOSEC) 40 MG capsule Take 40 mg by mouth daily as needed (FOR ACID REFLUX/INDIGESTION).   12   potassium chloride SA (K-DUR,KLOR-CON) 20 MEQ tablet Take 10 mEq by mouth daily.     rivaroxaban (XARELTO) 20 MG TABS tablet Take 1 tablet (20 mg total) by mouth daily with supper. 90 tablet 3   triamcinolone cream (KENALOG) 0.1 % Apply 1 application topically 2 (two) times daily as needed (Vaginal Itching).     No current facility-administered medications for this encounter.    Allergies:   Oxycodone, Hydrocodone, Percocet [oxycodone-acetaminophen], Soma [carisoprodol], and Ultram [tramadol hcl]   Social History:  The patient  reports that she quit smoking about 31 years ago. Her smoking use included  cigarettes. She has a 20.00 pack-year smoking history. She has never been exposed to tobacco smoke. She has never used smokeless tobacco. She reports that she does not drink alcohol and does not use drugs.   Family History:  The patient's family history includes Diabetes in her sister and sister; Emphysema in her mother; Heart disease in her father and sister; Other in her mother and sister; Varicose Veins in her mother.   ROS:  Please see the history of present illness.   All other systems are personally reviewed and negative.   Wt Readings from Last 3 Encounters:  10/30/21 108.4 kg (239 lb)  09/25/21 109 kg (240 lb 6.4 oz)  06/26/21 111.4 kg (245 lb 8 oz)   BP 128/80   Pulse 68  Wt 108.4 kg (239 lb)   SpO2 98%   BMI 37.43 kg/m   Exam:   General:  NAD. No resp difficulty, walked into clinic. HEENT: Normal Neck: Supple. No JVD. Carotids 2+ bilat; no bruits. No lymphadenopathy or thryomegaly appreciated. Cor: PMI nondisplaced. Regular rate & rhythm. No rubs, gallops or murmurs. Lungs: Clear Abdomen: Soft, obese, nontender, nondistended. No hepatosplenomegaly. No bruits or masses. Good bowel sounds. Extremities: No cyanosis, clubbing, rash, edema Neuro: Alert & oriented x 3, cranial nerves grossly intact. Moves all 4 extremities w/o difficulty. Affect pleasant.  Recent Labs: 06/26/2021: ALT 71; BUN 20; Creatinine 1.01; Hemoglobin 13.9; Platelet Count 128; Potassium 3.6; Sodium 141  Personally reviewed   ASSESSMENT AND PLAN: 1. Atrial fibrillation: Paroxysmal.  Zio patch in 8/19 showed a few short runs SVT.  Zio 7 day 5/23 mostly NSR, occasional PACs and few short runs of SVT, no atrial fibrillation.  - ContinueToprol XL to 75 mg daily.  - Continue Xarelto.  2. HTN: Stable. - Continue current medications. Labs from PCP visit 10/20/21 reviewed and look OK. 3. LUE DVT: Continue Xarelto.  No bleeding issues. 4. Exertional dyspnea: Resolved. Echo 5/23 EF 60-65% with normal LV  function. Normal RV.  5. Obesity: Body mass index is 37.43 kg/m. Pt referred to PharmD to discuss semaglutide.  She does not want to start Ozempic at this time due to cost. 6. Snoring: Discussed sleep study today. She would like to hold off for now.  Follow up in 6 months with Dr. Aundra Cox.  Cassandria Santee, FNP-BC 10/30/2021  [place] [address] [facility] [place] [address] [phone] (office) [phone] (fax)

## 2021-10-30 NOTE — Patient Instructions (Signed)
Thank you for coming in today  No labs today   Your physician recommends that you schedule a follow-up appointment in:  6 months with Dr. Aundra Dubin    Do the following things EVERYDAY: Weigh yourself in the morning before breakfast. Write it down and keep it in a log. Take your medicines as prescribed Eat low salt foods--Limit salt (sodium) to 2000 mg per day.  Stay as active as you can everyday Limit all fluids for the day to less than 2 liters  At the [facility], you and your health needs are our priority. As part of our continuing mission to provide you with exceptional heart care, we have created designated Provider Care Teams. These Care Teams include your primary Cardiologist (physician) and Advanced Practice Providers (APPs- Physician Assistants and Nurse Practitioners) who all work together to provide you with the care you need, when you need it.   You may see any of the following providers on your designated Care Team at your next follow up: Dr Glori Bickers Dr Loralie Champagne Dr. Roxana Hires, NP Lyda Jester, [place] [facility] [place], [place] Forestine Na, NP Audry Riles, PharmD   Please be sure to bring in all your medications bottles to every appointment.   If you have any questions or concerns before your next appointment please send [place] a message through [place] or call our office at [phone].    TO LEAVE A MESSAGE FOR THE NURSE SELECT OPTION 2, PLEASE LEAVE A MESSAGE INCLUDING: YOUR NAME DATE OF BIRTH CALL BACK NUMBER REASON FOR CALL**this is important as we prioritize the call backs  YOU WILL RECEIVE A CALL BACK THE SAME DAY AS LONG AS YOU CALL BEFORE 4:00 PM

## 2021-11-23 ENCOUNTER — Other Ambulatory Visit (HOSPITAL_COMMUNITY): Payer: Self-pay

## 2021-11-23 ENCOUNTER — Ambulatory Visit
Admission: RE | Admit: 2021-11-23 | Discharge: 2021-11-23 | Disposition: A | Discharge status: Home / Self Care | Payer: Medicare Other | Source: Ambulatory Visit | Attending: Physician Assistant | Admitting: Physician Assistant

## 2021-11-23 DIAGNOSIS — R918 Other nonspecific abnormal finding of lung field: Secondary | ICD-10-CM

## 2021-11-23 MED ORDER — METOPROLOL SUCCINATE ER 50 MG PO TB24: 75.0000 mg | ORAL_TABLET | Freq: Every day | ORAL | 3 refills | Status: DC

## 2021-11-24 ENCOUNTER — Ambulatory Visit: Payer: Medicare Other | Admitting: Podiatry

## 2021-11-24 DIAGNOSIS — B351 Tinea unguium: Secondary | ICD-10-CM

## 2021-11-24 MED ORDER — CICLOPIROX 8 % EX SOLN: Freq: Every day | CUTANEOUS | 0 refills | Status: DC

## 2021-11-24 NOTE — Progress Notes (Unsigned)
Subjective:  Patient ID: Tara Cox, female    DOB: 1934/04/23,  MRN: [id number]  Chief Complaint  Patient presents with   Hammer Toe    87 y.o. female presents with the above complaint.  Patient presents with complaint of thickened elongated dystrophic toenails/mycotic bilateral hallux.  Mild pain on palpation.  She states been present for quite some time she would like to discuss treatment options for it.  She has tried some over-the-counter medication none of which has helped.  She would like to discuss more prescription medication.  She does not want to take oral medication.   Review of Systems: Negative except as noted in the HPI. Denies N/V/F/Ch.  Past Medical History:  Diagnosis Date   A-fib [facility])    Arthritis    Breast cancer ([place]) 05/07/2017   Right Breast Cancer   Cancer ([place])    Chondromalacia, patella 12/08/2015   DDD (degenerative disc disease), lumbar 12/08/2015   S/P discectomy    Diaphragmatic hernia 12/08/2015   Dysrhythmia    A-Fib   Edema of lower extremity 06/29/09   Lower Venous Exam- normal. No evidence of thrombus or thrombophlebitis.   Esophagitis 12/08/2015   Frequent urination at night    GERD (gastroesophageal reflux disease)    Hemorrhoids    History of calcium pyrophosphate deposition disease (CPPD) 12/08/2015   History of hiatal hernia    History of miscarriage 12/08/2015   x3   History of total right knee replacement 12/08/2015   Hypertension 02/24/09   Echo-EF 62%; Myocardial Perfusion Study-Normal. No signifcant ischemia noted.   IBS (irritable bowel syndrome)    Migraine    Osteoarthritis of both feet 12/08/2015   Osteoarthritis of both hands 12/08/2015   Personal history of chemotherapy 2019   Right Breast Cancer   Personal history of radiation therapy 2019   Right Breast Cancer   PONV (postoperative nausea and vomiting)    Uterine fibroid 12/08/2015    Current Outpatient Medications:    ciclopirox (PENLAC) 8 %  solution, Apply topically at bedtime. Apply over nail and surrounding skin. Apply daily over previous coat. After seven (7) days, may remove with alcohol and continue cycle., Disp: 6.6 mL, Rfl: 0   acetaminophen (TYLENOL) 650 MG CR tablet, Take 650 mg by mouth every 8 (eight) hours as needed for pain., Disp: , Rfl:    anastrozole (ARIMIDEX) 1 MG tablet, Take 1 tablet (1 mg total) by mouth daily., Disp: 90 tablet, Rfl: 4   Ascorbic Acid (VITAMIN C WITH ROSE HIPS) 500 MG tablet, Take 500 mg by mouth daily., Disp: , Rfl:    atorvastatin (LIPITOR) 20 MG tablet, Take 20 mg by mouth at bedtime. , Disp: , Rfl:    benzonatate (TESSALON) 200 MG capsule, Take 200 mg by mouth 3 (three) times daily as needed for cough., Disp: , Rfl:    BIOTIN PO, Take 1,000 mcg by mouth daily., Disp: , Rfl:    Calcium Carbonate-Vit D-Min (CALCIUM 600+D3 PLUS MINERALS) 600-800 MG-UNIT TABS, Take 1 tablet by mouth at bedtime., Disp: , Rfl:    cetirizine (ZYRTEC) 10 MG tablet, Take 10 mg by mouth as needed for allergies., Disp: , Rfl:    colchicine 0.6 MG tablet, TAKE 1 TABLET BY MOUTH  DAILY AS NEEDED AS DIRECTED, Disp: 90 tablet, Rfl: 0   COVID-19 mRNA vaccine 2023-2024 (COMIRNATY) SUSP injection, Inject into the muscle., Disp: 0.3 mL, Rfl: 0   Diclofenac Sodium 1 % CREA, Apply topically. As needed, Disp: ,  Rfl:    diphenhydrAMINE HCl (BENADRYL PO), Take by mouth as needed., Disp: , Rfl:    furosemide (LASIX) 20 MG tablet, Take 20 mg by mouth daily., Disp: , Rfl:    gabapentin (NEURONTIN) 300 MG capsule, Take 300 mg by mouth 2 (two) times daily. , Disp: , Rfl:    hydrocortisone (ANUSOL-HC) 2.5 % rectal cream, Place 1 application rectally 2 (two) times daily as needed for hemorrhoids or itching., Disp: , Rfl:    influenza vaccine adjuvanted (FLUAD QUADRIVALENT) 0.5 ML injection, Inject into the muscle., Disp: 0.5 mL, Rfl: 0   losartan-hydrochlorothiazide (HYZAAR) 100-25 MG tablet, Take 1 tablet by mouth daily. , Disp: , Rfl:     magnesium oxide (MAG-OX) 400 MG tablet, Take 400 mg by mouth daily. , Disp: , Rfl:    metoprolol succinate (TOPROL-XL) 50 MG 24 hr tablet, Take 1.5 tablets (75 mg total) by mouth daily., Disp: 135 tablet, Rfl: 3   Multiple Vitamin (MULTIVITAMIN) tablet, Take 1 tablet by mouth daily. CENTRUM, Disp: , Rfl:    nystatin (MYCOSTATIN) 100000 UNIT/ML suspension, Take 5 mLs by mouth as needed., Disp: , Rfl:    Omega-3 Fatty Acids (OMEGA 3 PO), Take 1,000 Units by mouth daily., Disp: , Rfl:    omeprazole (PRILOSEC) 40 MG capsule, Take 40 mg by mouth daily as needed (FOR ACID REFLUX/INDIGESTION). , Disp: , Rfl: 12   potassium chloride SA (K-DUR,KLOR-CON) 20 MEQ tablet, Take 10 mEq by mouth daily., Disp: , Rfl:    rivaroxaban (XARELTO) 20 MG TABS tablet, Take 1 tablet (20 mg total) by mouth daily with supper., Disp: 90 tablet, Rfl: 3   triamcinolone cream (KENALOG) 0.1 %, Apply 1 application topically 2 (two) times daily as needed (Vaginal Itching)., Disp: , Rfl:   Social History   Tobacco Use  Smoking Status Former   Packs/day: 1.00   Years: 20.00   Total pack years: 20.00   Types: Cigarettes   Quit date: 02/19/1990   Years since quitting: 31.7   Passive exposure: Never  Smokeless Tobacco Never    Allergies  Allergen Reactions   Oxycodone Itching, Nausea And Vomiting and Other (See Comments)    Full body weakness    Hydrocodone Other (See Comments)    Reports they make her feel sick/drowsy Can take in small amounts   Percocet [Oxycodone-Acetaminophen] Itching    Pt says she can take tylenol    Soma [Carisoprodol] Nausea And Vomiting   Ultram [Tramadol Hcl] Nausea And Vomiting   Objective:  There were no vitals filed for this visit. There is no height or weight on file to calculate BMI. Constitutional Well developed. Well nourished.  Vascular Dorsalis pedis pulses palpable bilaterally. Posterior tibial pulses palpable bilaterally. Capillary refill normal to all digits.  No cyanosis or  clubbing noted. Pedal hair growth normal.  Neurologic Normal speech. Oriented to person, place, and time. Epicritic sensation to light touch grossly present bilaterally.  Dermatologic Nails bilateral thickened elongated dystrophic toenails x2 mild pain on palpation. Skin within normal limits  Orthopedic: Normal joint ROM without pain or crepitus bilaterally. No visible deformities. No bony tenderness.   Radiographs: None Assessment:   1. Nail fungus   2. Onychomycosis due to dermatophyte    Plan:  Patient was evaluated and treated and all questions answered.  Bilateral hallux onychomycosis/nail fungus -Educated the patient on the etiology of onychomycosis and various treatment options associated with improving the fungal load.  I explained to the patient that there is 3  treatment options available to treat the onychomycosis including topical, p.o., laser treatment.  Patient elected to undergo topical medication with Penlac.  I have asked her to apply twice a day.  This will take 6 to 8 months.  She states understanding will do so.   No follow-ups on file.
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
## 2022-01-09 ENCOUNTER — Other Ambulatory Visit: Payer: Self-pay | Admitting: Rheumatology

## 2022-01-09 MED ORDER — COLCHICINE 0.6 MG PO TABS: ORAL_TABLET | ORAL | 0 refills | Status: DC

## 2022-01-09 NOTE — Telephone Encounter (Signed)
Next Visit: 01/15/2022  Last Visit: 09/25/2021  Last Fill: 02/13/2021  DX: Calcium pyrophosphate deposition disease   Current Dose per office note 09/25/2021:  colchicine 0.6 mg 1 tablet daily as needed during flares.   Labs: 10/20/2021 Chloride 97, CO2 33, Glucose 113, AST 46,RBC 5.13, MCH 37.1, MCHC 32.4  Okay to refill Colchicine?

## 2022-01-09 NOTE — Telephone Encounter (Signed)
Patient called the office requesting a refill of Colchicine 0.'6mg'$  to be sent to [facility] in [place].

## 2022-01-15 ENCOUNTER — Ambulatory Visit: Payer: Medicare Other | Attending: Rheumatology | Admitting: Physician Assistant

## 2022-01-15 ENCOUNTER — Encounter: Payer: Self-pay | Admitting: Physician Assistant

## 2022-01-15 VITALS — BP 165/83 | HR 59 | Resp 17 | Ht 67.0 in | Wt 235.4 lb

## 2022-01-15 DIAGNOSIS — M65341 Trigger finger, right ring finger: Secondary | ICD-10-CM

## 2022-01-15 DIAGNOSIS — G8929 Other chronic pain: Secondary | ICD-10-CM

## 2022-01-15 DIAGNOSIS — Z96651 Presence of right artificial knee joint: Secondary | ICD-10-CM

## 2022-01-15 DIAGNOSIS — M533 Sacrococcygeal disorders, not elsewhere classified: Secondary | ICD-10-CM

## 2022-01-15 DIAGNOSIS — M19041 Primary osteoarthritis, right hand: Secondary | ICD-10-CM

## 2022-01-15 DIAGNOSIS — I480 Paroxysmal atrial fibrillation: Secondary | ICD-10-CM

## 2022-01-15 DIAGNOSIS — M19042 Primary osteoarthritis, left hand: Secondary | ICD-10-CM

## 2022-01-15 DIAGNOSIS — I10 Essential (primary) hypertension: Secondary | ICD-10-CM

## 2022-01-15 DIAGNOSIS — Z86718 Personal history of other venous thrombosis and embolism: Secondary | ICD-10-CM

## 2022-01-15 DIAGNOSIS — M1120 Other chondrocalcinosis, unspecified site: Secondary | ICD-10-CM | POA: Diagnosis not present

## 2022-01-15 DIAGNOSIS — M5136 Other intervertebral disc degeneration, lumbar region: Secondary | ICD-10-CM

## 2022-01-15 DIAGNOSIS — C50111 Malignant neoplasm of central portion of right female breast: Secondary | ICD-10-CM

## 2022-01-15 DIAGNOSIS — E7800 Pure hypercholesterolemia, unspecified: Secondary | ICD-10-CM

## 2022-01-15 DIAGNOSIS — M19072 Primary osteoarthritis, left ankle and foot: Secondary | ICD-10-CM

## 2022-01-15 DIAGNOSIS — M180 Bilateral primary osteoarthritis of first carpometacarpal joints: Secondary | ICD-10-CM

## 2022-01-15 DIAGNOSIS — M0609 Rheumatoid arthritis without rheumatoid factor, multiple sites: Secondary | ICD-10-CM | POA: Diagnosis not present

## 2022-01-15 DIAGNOSIS — M722 Plantar fascial fibromatosis: Secondary | ICD-10-CM

## 2022-01-15 DIAGNOSIS — M8589 Other specified disorders of bone density and structure, multiple sites: Secondary | ICD-10-CM

## 2022-01-15 DIAGNOSIS — Z170 Estrogen receptor positive status [ER+]: Secondary | ICD-10-CM

## 2022-01-15 DIAGNOSIS — M19071 Primary osteoarthritis, right ankle and foot: Secondary | ICD-10-CM

## 2022-01-23 ENCOUNTER — Other Ambulatory Visit: Payer: Self-pay | Admitting: Hematology and Oncology

## 2022-01-28 ENCOUNTER — Other Ambulatory Visit: Payer: Self-pay | Admitting: Hematology and Oncology

## 2022-01-31 ENCOUNTER — Other Ambulatory Visit: Payer: Self-pay | Admitting: *Deleted

## 2022-01-31 MED ORDER — RIVAROXABAN 20 MG PO TABS: 20.0000 mg | ORAL_TABLET | Freq: Every day | ORAL | 3 refills | Status: DC

## 2022-03-30 ENCOUNTER — Other Ambulatory Visit: Payer: Self-pay | Admitting: Physician Assistant

## 2022-03-30 NOTE — Telephone Encounter (Signed)
Last Fill: 01/09/2022  Labs: 10/20/2021 Chloride 97, CO2 33, Glucose 113, RBC 5.13, MCH 27.1, MCHC 32.4  Next Visit: 07/05/2022  Last Visit: 01/15/2022  DX: Calcium pyrophosphate deposition disease   Current Dose per office note 1/8/024: colchicine 0.6 mg 1 tablet daily as needed during flares   Okay to refill Colchicine?

## 2022-04-09 ENCOUNTER — Ambulatory Visit: Payer: Medicare Other | Admitting: Rheumatology

## 2022-05-01 ENCOUNTER — Other Ambulatory Visit: Payer: Self-pay | Admitting: Hematology and Oncology

## 2022-05-01 DIAGNOSIS — Z1231 Encounter for screening mammogram for malignant neoplasm of breast: Secondary | ICD-10-CM

## 2022-06-18 ENCOUNTER — Ambulatory Visit
Admission: RE | Admit: 2022-06-18 | Discharge: 2022-06-18 | Disposition: A | Discharge status: Home / Self Care | Payer: Medicare Other | Source: Ambulatory Visit | Attending: Hematology and Oncology | Admitting: Hematology and Oncology

## 2022-06-18 DIAGNOSIS — Z1231 Encounter for screening mammogram for malignant neoplasm of breast: Secondary | ICD-10-CM

## 2022-06-19 ENCOUNTER — Telehealth: Payer: Self-pay | Admitting: Hematology and Oncology

## 2022-06-19 NOTE — Telephone Encounter (Signed)
Spoke with patient confirming upcoming appointment change  
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
## 2022-06-27 ENCOUNTER — Other Ambulatory Visit: Payer: Medicare Other

## 2022-06-27 ENCOUNTER — Ambulatory Visit: Payer: Medicare Other | Admitting: Hematology and Oncology

## 2022-06-28 ENCOUNTER — Other Ambulatory Visit: Payer: Self-pay | Admitting: Physician Assistant

## 2022-06-28 NOTE — Telephone Encounter (Signed)
Last Fill: 04/02/2022  Labs: 06/13/2022 Glucose 109, eGFR 55, Alkaline Phosphatase 119, AST 94, ALT 90, RBC 5.31, MCH 27.1, MPV 10.5, Neutrophil Absolute 1.40  Next Visit: 07/05/2022  Last Visit: 01/15/2022  DX: Calcium pyrophosphate deposition disease   Current Dose per office note 01/15/2022: colchicine 0.6 mg 1 tablet daily as needed during flares   Okay to refill Colchicine?

## 2022-07-05 ENCOUNTER — Ambulatory Visit: Payer: Medicare Other | Admitting: Rheumatology

## 2022-07-05 DIAGNOSIS — M180 Bilateral primary osteoarthritis of first carpometacarpal joints: Secondary | ICD-10-CM

## 2022-07-05 DIAGNOSIS — G8929 Other chronic pain: Secondary | ICD-10-CM

## 2022-07-05 DIAGNOSIS — M19072 Primary osteoarthritis, left ankle and foot: Secondary | ICD-10-CM

## 2022-07-05 DIAGNOSIS — E7800 Pure hypercholesterolemia, unspecified: Secondary | ICD-10-CM

## 2022-07-05 DIAGNOSIS — M8589 Other specified disorders of bone density and structure, multiple sites: Secondary | ICD-10-CM

## 2022-07-05 DIAGNOSIS — I480 Paroxysmal atrial fibrillation: Secondary | ICD-10-CM

## 2022-07-05 DIAGNOSIS — M0609 Rheumatoid arthritis without rheumatoid factor, multiple sites: Secondary | ICD-10-CM

## 2022-07-05 DIAGNOSIS — M722 Plantar fascial fibromatosis: Secondary | ICD-10-CM

## 2022-07-05 DIAGNOSIS — M65341 Trigger finger, right ring finger: Secondary | ICD-10-CM

## 2022-07-05 DIAGNOSIS — M5136 Other intervertebral disc degeneration, lumbar region: Secondary | ICD-10-CM

## 2022-07-05 DIAGNOSIS — M1120 Other chondrocalcinosis, unspecified site: Secondary | ICD-10-CM

## 2022-07-05 DIAGNOSIS — Z96651 Presence of right artificial knee joint: Secondary | ICD-10-CM

## 2022-07-05 DIAGNOSIS — Z86718 Personal history of other venous thrombosis and embolism: Secondary | ICD-10-CM

## 2022-07-05 DIAGNOSIS — M19042 Primary osteoarthritis, left hand: Secondary | ICD-10-CM

## 2022-07-05 DIAGNOSIS — C50111 Malignant neoplasm of central portion of right female breast: Secondary | ICD-10-CM

## 2022-07-05 DIAGNOSIS — I10 Essential (primary) hypertension: Secondary | ICD-10-CM

## 2022-07-09 ENCOUNTER — Other Ambulatory Visit: Payer: Self-pay | Admitting: *Deleted

## 2022-07-09 DIAGNOSIS — C50911 Malignant neoplasm of unspecified site of right female breast: Secondary | ICD-10-CM

## 2022-07-10 ENCOUNTER — Inpatient Hospital Stay: Payer: Medicare Other | Admitting: Hematology and Oncology

## 2022-07-10 ENCOUNTER — Inpatient Hospital Stay: Payer: Medicare Other | Attending: Hematology and Oncology

## 2022-07-10 ENCOUNTER — Encounter: Payer: Self-pay | Admitting: Hematology and Oncology

## 2022-07-10 ENCOUNTER — Other Ambulatory Visit: Payer: Self-pay

## 2022-07-10 VITALS — BP 136/83 | HR 65 | Temp 97.5°F | Resp 16 | Wt 233.6 lb

## 2022-07-10 DIAGNOSIS — Z853 Personal history of malignant neoplasm of breast: Secondary | ICD-10-CM | POA: Diagnosis present

## 2022-07-10 DIAGNOSIS — C50911 Malignant neoplasm of unspecified site of right female breast: Secondary | ICD-10-CM

## 2022-07-10 DIAGNOSIS — Z9011 Acquired absence of right breast and nipple: Secondary | ICD-10-CM | POA: Insufficient documentation

## 2022-07-10 DIAGNOSIS — Z800 Family history of malignant neoplasm of digestive organs: Secondary | ICD-10-CM | POA: Diagnosis not present

## 2022-07-10 DIAGNOSIS — M79601 Pain in right arm: Secondary | ICD-10-CM | POA: Diagnosis not present

## 2022-07-10 DIAGNOSIS — Z87891 Personal history of nicotine dependence: Secondary | ICD-10-CM | POA: Diagnosis not present

## 2022-07-10 DIAGNOSIS — Z170 Estrogen receptor positive status [ER+]: Secondary | ICD-10-CM

## 2022-07-10 DIAGNOSIS — M6530 Trigger finger, unspecified finger: Secondary | ICD-10-CM | POA: Insufficient documentation

## 2022-07-10 DIAGNOSIS — C50111 Malignant neoplasm of central portion of right female breast: Secondary | ICD-10-CM | POA: Diagnosis not present

## 2022-07-10 DIAGNOSIS — M79641 Pain in right hand: Secondary | ICD-10-CM | POA: Insufficient documentation

## 2022-07-10 LAB — CBC WITH DIFFERENTIAL (CANCER CENTER ONLY)
Abs Immature Granulocytes: 0.01 10*3/uL (ref 0.00–0.07)
Basophils Absolute: 0.1 10*3/uL (ref 0.0–0.1)
Basophils Relative: 1 %
Eosinophils Absolute: 0.1 10*3/uL (ref 0.0–0.5)
Eosinophils Relative: 2 %
HCT: 45.1 % (ref 36.0–46.0)
Hemoglobin: 14.4 g/dL (ref 12.0–15.0)
Immature Granulocytes: 0 %
Lymphocytes Relative: 53 %
Lymphs Abs: 2.2 10*3/uL (ref 0.7–4.0)
MCH: 27.2 pg (ref 26.0–34.0)
MCHC: 31.9 g/dL (ref 30.0–36.0)
MCV: 85.1 fL (ref 80.0–100.0)
Monocytes Absolute: 0.3 10*3/uL (ref 0.1–1.0)
Monocytes Relative: 7 %
Neutro Abs: 1.6 10*3/uL — ABNORMAL LOW (ref 1.7–7.7)
Neutrophils Relative %: 37 %
Platelet Count: 144 10*3/uL — ABNORMAL LOW (ref 150–400)
RBC: 5.3 MIL/uL — ABNORMAL HIGH (ref 3.87–5.11)
RDW: 14.5 % (ref 11.5–15.5)
WBC Count: 4.2 10*3/uL (ref 4.0–10.5)
nRBC: 0 % (ref 0.0–0.2)

## 2022-07-10 LAB — CMP (CANCER CENTER ONLY)
ALT: 84 U/L — ABNORMAL HIGH (ref 0–44)
AST: 79 U/L — ABNORMAL HIGH (ref 15–41)
Albumin: 3.8 g/dL (ref 3.5–5.0)
Alkaline Phosphatase: 116 U/L (ref 38–126)
Anion gap: 8 (ref 5–15)
BUN: 12 mg/dL (ref 8–23)
CO2: 30 mmol/L (ref 22–32)
Calcium: 9.3 mg/dL (ref 8.9–10.3)
Chloride: 104 mmol/L (ref 98–111)
Creatinine: 0.91 mg/dL (ref 0.44–1.00)
GFR, Estimated: >60 mL/min (ref >60)
Glucose, Bld: 125 mg/dL — ABNORMAL HIGH (ref 70–99)
Potassium: 4 mmol/L (ref 3.5–5.1)
Sodium: 142 mmol/L (ref 135–145)
Total Bilirubin: 0.6 mg/dL (ref 0.3–1.2)
Total Protein: 6.2 g/dL — ABNORMAL LOW (ref 6.5–8.1)

## 2022-07-10 NOTE — Progress Notes (Signed)
[facility]  Telephone:([phone] [phone]) [phone]    ID: Tara Cox DOB: 1934/03/01  MR#: [id number]  MWN#:[id number]  Patient Care Team: Barbie Banner, MD as PCP - General (Family Medicine) Laurey Morale, MD as PCP - Advanced Heart Failure (Cardiology) Magrinat, Valentino Hue, MD (Inactive) as Consulting Physician (Oncology) Harriette Bouillon, MD as Consulting Physician (General Surgery) Dorothy Puffer, MD as Consulting Physician (Radiation Oncology) Dannielle Huh, MD as Consulting Physician (Orthopedic Surgery) Pollyann Savoy, MD as Consulting Physician (Rheumatology) Runell Gess, MD as Consulting Physician (Cardiology)  CHIEF COMPLAINT: Triple positive breast cancer (s/p right mastectomy)  CURRENT TREATMENT: Anastrozole, rivaroxaban   INTERVAL HISTORY:  Tara Cox returns today for follow-up of her triple positive breast cancer.  She completed 5 yrs of anastrozole in May 2024. She is ready to discontinue it. She mostly complains of pain in her right arm and hand. She also has trigger finger in her right hand which limits her from doing any activity. Mammogram June 2024, no evidence of malignancy. Rest of the pertinent 10 point ROS reviewed and negative.   COVID 19 VACCINATION STATUS: fully vaccinated [employer]), with 4th dose 04/13/2020   HISTORY OF CURRENT ILLNESS: From the original intake note:  Jolleen Juenemann had mammography at the breast center October 11, 2011 showing calcifications in the upper right breast.  Six-month follow-up was suggested, but  the patient did not follow-up.  More recently she noticed nipple inversion on the right breast and followed this without reporting it over the last several months. She did undergo bilateral diagnostic mammography with tomography and right breast ultrasonography [facility] on 03/26/2017 showing: breast density category B. There is a highly suspicious right retroareolar mass  measuring  approximately 2.3 x 1.5 x 2.8 cm  with associated calcifications, together the mass and calcifications measure up to 3.3 cm mammographically. There are 2 lymph nodes in the right axilla with borderline thickened cortices, 1 of which measures 1 x 0.9 x 0.6 cm with a cortex thickness of 0.4 mm.  Accordingly on 03/29/2017 she proceeded to biopsy of the right breast and one axillary lymph node.. The pathology from this procedure showed ([id number]): Invasive lobular carcinoma,grade II, E-cadherin negative. The right axillary lymph node was negative for carcinoma. Prognostic indicators significant for: estrogen receptor, 100% positive and progesterone receptor, 100% positive, both with strong staining intensity. Proliferation marker Ki67 at 25%. HER2 amplified with ratios  HER2/CEP17 signals 2.22 and average HER2 copies per cell 3.00  The patient's subsequent history is as detailed below.   PAST MEDICAL HISTORY: Past Medical History:  Diagnosis Date   A-fib [facility])    Arthritis    Breast cancer (HCC) 05/07/2017   Right Breast Cancer   Cancer (HCC)    Chondromalacia, patella 12/08/2015   DDD (degenerative disc disease), lumbar 12/08/2015   S/P discectomy    Diaphragmatic hernia 12/08/2015   Dysrhythmia    A-Fib   Edema of lower extremity 06/29/09   Lower Venous Exam- normal. No evidence of thrombus or thrombophlebitis.   Esophagitis 12/08/2015   Frequent urination at night    GERD (gastroesophageal reflux disease)    Hemorrhoids    History of calcium pyrophosphate deposition disease (CPPD) 12/08/2015   History of hiatal hernia    History of miscarriage 12/08/2015   x3   History of total right knee replacement 12/08/2015   Hypertension 02/24/09   Echo-EF 62%; Myocardial Perfusion Study-Normal. No signifcant ischemia noted.   IBS (irritable bowel syndrome)  Migraine    Osteoarthritis of both feet 12/08/2015   Osteoarthritis of both hands 12/08/2015   Personal history of chemotherapy 2019    Right Breast Cancer   Personal history of radiation therapy 2019   Right Breast Cancer   PONV (postoperative nausea and vomiting)    Uterine fibroid 12/08/2015    PAST SURGICAL HISTORY: Past Surgical History:  Procedure Laterality Date   ABDOMINAL HYSTERECTOMY  1974   APPENDECTOMY     BACK SURGERY  2001   BUNIONECTOMY  1986   CESAREAN SECTION  1971   COLONOSCOPY     COLONOSCOPY WITH PROPOFOL N/A 05/28/2014   Procedure: COLONOSCOPY WITH PROPOFOL;  Surgeon: Jeani Hawking, MD;  Location: [facility];  Service: Endoscopy;  Laterality: N/A;   ESOPHAGOGASTRODUODENOSCOPY (EGD) WITH PROPOFOL N/A 05/28/2014   Procedure: ESOPHAGOGASTRODUODENOSCOPY (EGD) WITH PROPOFOL;  Surgeon: Jeani Hawking, MD;  Location: [facility];  Service: Endoscopy;  Laterality: N/A;   EXPLORATORY LAPAROTOMY     open procedure   EYE SURGERY Bilateral    cataract removal   MASTECTOMY Left 05/07/2017   PORT-A-CATH REMOVAL Right 07/17/2018   Procedure: PORT REMOVAL;  Surgeon: Harriette Bouillon, MD;  Location: [facility];  Service: General;  Laterality: Right;   PORTACATH PLACEMENT Right 05/07/2017   Procedure: INSERTION PORT-A-CATH;  Surgeon: Harriette Bouillon, MD;  Location: [facility];  Service: General;  Laterality: Right;   SHOULDER SURGERY Right    SIMPLE MASTECTOMY WITH AXILLARY SENTINEL NODE BIOPSY Right 05/07/2017   Procedure: RIGHT SIMPLE MASTECTOMY WITH AXILLARY SENTINEL NODE BIOPSY;  Surgeon: Harriette Bouillon, MD;  Location: [facility];  Service: General;  Laterality: Right;   TOTAL KNEE ARTHROPLASTY Right 01/26/2013   DR Sherlean Foot   TOTAL KNEE ARTHROPLASTY Right 01/26/2013   Procedure: TOTAL KNEE ARTHROPLASTY;  Surgeon: Dannielle Huh, MD;  Location: [facility];  Service: Orthopedics;  Laterality: Right;   UTERINE FIBROID SURGERY  1967    FAMILY HISTORY Family History  Problem Relation Age of Onset   Other Mother    Varicose Veins Mother    Emphysema Mother    Heart disease Father        heart attack   Diabetes  Sister    Other Sister        pacemaker   Diabetes Sister    Heart disease Sister   The patient's father died in 62 (around age 66) in [place] due to sudden death. The patient's mother died around age 110 due to emphysema. The patient was born in [place], [place]. The patient has 2 brothers and 3 sisters. One sister had a lumpectomy in 1991, but this was not malignant. Another sister died of pancreatic cancer. The patient denies a family history of breast or ovarian cancer.     GYNECOLOGIC HISTORY:  No LMP recorded. Patient has had a hysterectomy. Menarche: 88 years old Age at first live birth: 88 years old She is GXP1.  Her LMP was at age 68 when she underwent hysterectomy, without salpingo-oophorectomy. She was on Premarin for more than 20 years.  She never used oral contraceptives.   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 Her husband, Jomarie Longs, used to be in banking, and he is currently retired. The patient's biological daughter, Aggie Cosier, is a Art therapist at a contact center in [place]. The patient's adopted daughter, Efraim Kaufmann, is an Doctor, general practice in [place]. The patient's adopted son, Judie Grieve, is unemployed in [place]. The patient has 8 grandchildren, one  of whom works as a Manufacturing engineer.  The patient belongs to [facility] [address].    ADVANCED DIRECTIVES: In the absence of any documentation to the contrary, the patient's spouse is their HCPOA.    HEALTH MAINTENANCE: Social History   Tobacco Use   Smoking status: Former    Packs/day: 1.00    Years: 20.00    Additional pack years: 0.00    Total pack years: 20.00    Types: Cigarettes    Quit date: 02/19/1990    Years since quitting: 32.4    Passive exposure: Never   Smokeless tobacco: Never  Vaping Use   Vaping Use: Never used  Substance Use Topics   Alcohol use: No   Drug use: Never    Colonoscopy: Dr. Elnoria Howard 2016  PAP:  Bone  density:   Allergies  Allergen Reactions   Oxycodone Itching, Nausea And Vomiting and Other (See Comments)    Full body weakness    Hydrocodone Other (See Comments)    Reports they make her feel sick/drowsy Can take in small amounts   Percocet [Oxycodone-Acetaminophen] Itching    Pt says she can take tylenol    Soma [Carisoprodol] Nausea And Vomiting   Ultram [Tramadol Hcl] Nausea And Vomiting    Current Outpatient Medications  Medication Sig Dispense Refill   acetaminophen (TYLENOL) 650 MG CR tablet Take 650 mg by mouth every 8 (eight) hours as needed for pain.     anastrozole (ARIMIDEX) 1 MG tablet Take 1 tablet (1 mg total) by mouth daily. 90 tablet 4   Ascorbic Acid (VITAMIN C WITH ROSE HIPS) 500 MG tablet Take 500 mg by mouth daily.     atorvastatin (LIPITOR) 20 MG tablet Take 20 mg by mouth at bedtime.      benzonatate (TESSALON) 200 MG capsule Take 200 mg by mouth 3 (three) times daily as needed for cough.     BIOTIN PO Take 1,000 mcg by mouth daily.     Calcium Carbonate-Vit D-Min (CALCIUM 600+D3 PLUS MINERALS) 600-800 MG-UNIT TABS Take 1 tablet by mouth at bedtime.     cetirizine (ZYRTEC) 10 MG tablet Take 10 mg by mouth as needed for allergies.     ciclopirox (PENLAC) 8 % solution Apply topically at bedtime. Apply over nail and surrounding skin. Apply daily over previous coat. After seven (7) days, may remove with alcohol and continue cycle. (Patient not taking: Reported on 01/15/2022) 6.6 mL 0   colchicine 0.6 MG tablet TAKE 1 TABLET BY MOUTH DAILY AS NEEDED AS DIRECTED 90 tablet 0   COVID-19 mRNA vaccine 2023-2024 (COMIRNATY) SUSP injection Inject into the muscle. (Patient not taking: Reported on 01/15/2022) 0.3 mL 0   Diclofenac Sodium 1 % CREA Apply topically. As needed     diphenhydrAMINE HCl (BENADRYL PO) Take by mouth as needed.     furosemide (LASIX) 20 MG tablet Take 20 mg by mouth daily.     gabapentin (NEURONTIN) 300 MG capsule Take 300 mg by mouth 2 (two) times daily.       hydrocortisone (ANUSOL-HC) 2.5 % rectal cream Place 1 application rectally 2 (two) times daily as needed for hemorrhoids or itching.     influenza vaccine adjuvanted (FLUAD QUADRIVALENT) 0.5 ML injection Inject into the muscle. (Patient not taking: Reported on 01/15/2022) 0.5 mL 0   losartan-hydrochlorothiazide (HYZAAR) 100-25 MG tablet Take 1 tablet by mouth daily.      magnesium oxide (MAG-OX) 400 MG tablet Take 400 mg by mouth  daily.      metoprolol succinate (TOPROL-XL) 50 MG 24 hr tablet Take 1.5 tablets (75 mg total) by mouth daily. 135 tablet 3   Multiple Vitamin (MULTIVITAMIN) tablet Take 1 tablet by mouth daily. CENTRUM     nystatin (MYCOSTATIN) 100000 UNIT/ML suspension Take 5 mLs by mouth as needed.     Omega-3 Fatty Acids (OMEGA 3 PO) Take 1,000 Units by mouth daily.     omeprazole (PRILOSEC) 40 MG capsule Take 40 mg by mouth daily as needed (FOR ACID REFLUX/INDIGESTION).   12   potassium chloride SA (K-DUR,KLOR-CON) 20 MEQ tablet Take 10 mEq by mouth daily.     rivaroxaban (XARELTO) 20 MG TABS tablet Take 1 tablet (20 mg total) by mouth daily with supper. 90 tablet 3   triamcinolone cream (KENALOG) 0.1 % Apply 1 application topically 2 (two) times daily as needed (Vaginal Itching).     No current facility-administered medications for this visit.    OBJECTIVE: African-American woman who appears stated age  Vitals:   07/10/22 0924  BP: 136/83  Pulse: 65  Resp: 16  Temp: (!) 97.5 F (36.4 C)  SpO2: 97%     Body mass index is 36.59 kg/m.   Wt Readings from Last 3 Encounters:  07/10/22 233 lb 9.6 oz (106 kg)  01/15/22 235 lb 6.4 oz (106.8 kg)  10/30/21 239 lb (108.4 kg)  ECOG FS:1 - Symptomatic but completely ambulatory  Physical Exam Constitutional:      Appearance: Normal appearance.  Chest:     Comments: Right breast s.p mastectomy Left breast with inspection. No palpable masses No regional adenopathy. Musculoskeletal:        General: Swelling (Bilateral ankle  swelling, mild) present.     Cervical back: Normal range of motion and neck supple. No rigidity.  Lymphadenopathy:     Cervical: No cervical adenopathy.  Neurological:     Mental Status: She is alert.      LAB RESULTS:  CMP     Component Value Date/Time   NA 141 06/26/2021 1028   NA 142 09/22/2015 0000   K 3.6 06/26/2021 1028   CL 104 06/26/2021 1028   CO2 32 06/26/2021 1028   GLUCOSE 153 (H) 06/26/2021 1028   BUN 20 06/26/2021 1028   BUN 19 09/22/2015 0000   CREATININE 1.01 (H) 06/26/2021 1028   CREATININE 0.92 (H) 08/31/2019 1057   CALCIUM 9.8 06/26/2021 1028   PROT 6.5 06/26/2021 1028   ALBUMIN 3.9 06/26/2021 1028   AST 58 (H) 06/26/2021 1028   ALT 71 (H) 06/26/2021 1028   ALKPHOS 104 06/26/2021 1028   BILITOT 0.6 06/26/2021 1028   GFRNONAA 55 (L) 06/26/2021 1028   GFRNONAA 58 (L) 08/31/2019 1057   GFRAA 67 08/31/2019 1057    Lab Results  Component Value Date   ALBUMINELP 4.0 08/31/2019   A1GS 0.3 08/31/2019   A2GS 0.8 08/31/2019   BETS 0.5 08/31/2019   BETA2SER 0.4 08/31/2019   GAMS 0.9 08/31/2019   SPEI  08/31/2019     Comment:     Normal Serum Protein Electrophoresis Pattern. No abnormal protein bands (M-protein) detected.     No results found for: "KPAFRELGTCHN", "LAMBDASER", "KAPLAMBRATIO"  Lab Results  Component Value Date   WBC 4.2 07/10/2022   NEUTROABS 1.6 (L) 07/10/2022   HGB 14.4 07/10/2022   HCT 45.1 07/10/2022   MCV 85.1 07/10/2022   PLT 144 (L) 07/10/2022   No results found for: "LABCA2"  No components found for: "  WGNFAO130"  No results for input(s): "INR" in the last 168 hours.  No results found for: "LABCA2"  No results found for: "QMV784"  No results found for: "CAN125"  No results found for: "CAN153"  No results found for: "CA2729"  No components found for: "HGQUANT"  No results found for: "CEA1", "CEA" / No results found for: "CEA1", "CEA"   No results found for: "AFPTUMOR"  No results found for:  "CHROMOGRNA"  No results found for: "HGBA", "HGBA2QUANT", "HGBFQUANT", "HGBSQUAN" (Hemoglobinopathy evaluation)   Lab Results  Component Value Date   LDH 162 10/22/2006    No results found for: "IRON", "TIBC", "IRONPCTSAT" (Iron and TIBC)  No results found for: "FERRITIN"  Urinalysis    Component Value Date/Time   COLORURINE YELLOW 09/27/2015 1532   APPEARANCEUR CLEAR 09/27/2015 1532   LABSPEC 1.015 09/27/2015 1532   PHURINE 6.5 09/27/2015 1532   GLUCOSEU NEGATIVE 09/27/2015 1532   HGBUR TRACE (A) 09/27/2015 1532   BILIRUBINUR NEGATIVE 09/27/2015 1532   KETONESUR NEGATIVE 09/27/2015 1532   PROTEINUR NEGATIVE 09/27/2015 1532   UROBILINOGEN 0.2 01/19/2013 0954   NITRITE NEGATIVE 09/27/2015 1532   LEUKOCYTESUR NEGATIVE 09/27/2015 1532    STUDIES: MM 3D SCREENING MAMMOGRAM UNILATERAL LEFT BREAST  Result Date: 06/19/2022 CLINICAL DATA:  Screening. EXAM: DIGITAL SCREENING UNILATERAL LEFT MAMMOGRAM WITH CAD AND TOMOSYNTHESIS TECHNIQUE: Left screening digital craniocaudal and mediolateral oblique mammograms were obtained. Left screening digital breast tomosynthesis was performed. The images were evaluated with computer-aided detection. COMPARISON:  Previous exam(s). ACR Breast Density Category b: There are scattered areas of fibroglandular density. FINDINGS: There are no findings suspicious for malignancy. IMPRESSION: No mammographic evidence of malignancy. A result letter of this screening mammogram will be mailed directly to the patient. RECOMMENDATION: Screening mammogram in one year. (Code:SM-B-01Y) BI-RADS CATEGORY  1: Negative. Electronically Signed   By: Amie Portland M.D.   On: 06/19/2022 13:02  Lab work obtained at [facility] 04/12/2020 shows a sodium of 140 potassium 4.2 chloride 98 CO2 34 which is unchanged from baseline BUN 20, glucose 109, creatinine 0.95, calcium 10.4 albumin 4.3 total bilirubin 0.9 alkaline phosphatase 117, AST 29 ALT 34, total protein 7.2 and a white cell  count of 5.0 hemoglobin 14.4 MCV 83.5 and platelets 174,000.  ELIGIBLE FOR AVAILABLE RESEARCH PROTOCOL: [employer] sciences study   ASSESSMENT: 88 y.o. [place] woman status post central right breast biopsy 03/29/2017 for a clinical T1c N0, stage IA invasive lobular carcinoma, grade 2, strongly estrogen and progesterone receptor positive, also HER-2 amplified, with an MIB-1 of 25%  (1) status post right mastectomy with sentinel lymph node sampling 05/07/2017 for a pT3 pN1, stage IIB invasive lobular carcinoma, grade 3, with negative margins.  (2) adjuvant radiation 07/02/2017 - 08/16/2017  Site/dose:   The patient initially received a dose of 50.4 Gy in 28 fractions to the right chest wall and supraclavicular region. This was delivered using a 3-D conformal, 4 field technique. The patient then received a boost to the mastectomy scar. This delivered an additional 10 Gy in 5 fractions using an en face electron field. The total dose was 60.4 Gy.   (3) trastuzumab adjuvantly for 1 year started 06/04/2017, to be completed 05/13/2018  (a) echocardiogram 04/16/2017 shows an ejection fraction of 60-65%.  (b) echocardiogram 08/21/2017 shows an ejection fraction in the 60-65% range  (c) echo 11/22/2017 shows an ejection fraction in the 60-65% range  (d) echocardiogram 02/27/2018 showed an ejection fraction in the 60-65% range.  (4) anastrozole started 05/28/2017  (  a) bone density 10/31/2017 shows a T score of -1.5  (5) small seroma right axilla  (6) history of paroxysmal atrial fibrillation, on lifelong anticoagulation   (a) left jugular and subclavian DVT noted on 01/17/2018 while taking Warfarin   (a) started Enoxaparin 150mg  daily January 2020, switched to rivaroxaban February 2020   PLAN:  She has now completed 5 yrs of anastrozole. Recent mammogram with no evidence of malignancy On exam, she is s.p right mastectomy, no evidence of recurrence, left breast normal to inspection and  palpation Mammogram due again in June 2025. She is exercising regularly, doing bike 30 min every day. RTC as needed. Total time spent: 30 minutes.  *Total Encounter Time as defined by the Centers for Medicare and Medicaid Services includes, in addition to the face-to-face time of a patient visit (documented in the note above) non-face-to-face time: obtaining and reviewing outside history, ordering and reviewing medications, tests or procedures, care coordination (communications with other health care professionals or caregivers) and documentation in the medical record.

## 2022-07-13 ENCOUNTER — Telehealth: Payer: Self-pay | Admitting: *Deleted

## 2022-07-13 NOTE — Telephone Encounter (Signed)
This RN called pt per MD request to review intake of tylenol.  She states she had taken increased doses due to developing migraines - she was seen recently for her wellness check by her primary MD who noted as well- she is abstaining from the tylenol and will be followed back up by her primary next week.  Noted labs in CareEverywhere dated 06/13/2022 with noted elevations in above labs now showing decrease since pt has been holding use of tylenol.

## 2022-07-19 ENCOUNTER — Telehealth: Payer: Self-pay | Admitting: *Deleted

## 2022-07-19 NOTE — Telephone Encounter (Signed)
Tara Moulds, MD sent to [name], RN Caller: Unspecified (6 days ago,  3:05 PM) Looks like LFT's continue to uptrend. If she is indeed holding tylenol and not consuming any hepatotoxins, can u ask her to hold anastrozole and come back for CMP in 2 weeks please.  Thanks  This RN called pt per above- she has stopped the anstrozole per visit on 7/2.  She saw her primary MD who is following her elevated labs too- and is recommending abd [place] for evaluation ( scheduled at [facility] ) and recheck in one month.  This RN informed we will follow up per Care Everywhere per above.

## 2022-10-10 ENCOUNTER — Other Ambulatory Visit (HOSPITAL_COMMUNITY): Payer: Self-pay | Admitting: Family Medicine

## 2022-10-22 ENCOUNTER — Encounter: Payer: Self-pay | Admitting: Oncology

## 2022-10-22 NOTE — Telephone Encounter (Signed)
Error

## 2022-10-30 ENCOUNTER — Other Ambulatory Visit (HOSPITAL_COMMUNITY): Payer: Self-pay

## 2022-11-09 ENCOUNTER — Other Ambulatory Visit: Payer: Self-pay | Admitting: *Deleted

## 2022-11-20 ENCOUNTER — Telehealth: Payer: Self-pay

## 2022-11-20 NOTE — Telephone Encounter (Signed)
Pt called and LVM requesting refill for Nystatin mouth wash. Called pt to evaluate need since it has not been filled since 2022. She states. "Sometimes my tongue gets white and I rinse with it but now I am out."  Advised pt this medication is used for mucositis or thrush and she would need to contact her PCP regarding sx related to this. She verbalized understanding and knows to call with any further oncology concerns.

## 2022-11-30 NOTE — Progress Notes (Signed)
Office Visit Note  Patient: Tara Cox             Date of Birth: 08-19-1934           MRN: [id number]             PCP: Barbie Banner, MD Referring: Barbie Banner, MD Visit Date: 12/14/2022 Occupation: @GUAROCC @  Subjective:  Medication monitoring   History of Present Illness: Ajai Mellott is a 88 y.o. female with history of seronegative rheumatoid arthritis and CPPD.   Patient denies any recent rheumatoid arthritis flares.  She has been off of Plaquenil since August 2021.  She continues to experience intermittent discomfort in her right hand and both knee joints.  She has been using Voltaren gel topically as needed for pain relief and has also used arthritis compression gloves.  Patient states that she had a right ring trigger finger injection performed by Dr. Merlyn Lot in early September 2024 which helped alleviate her symptoms but she continues to have intermittent locking.  She is not ready to proceed with surgical intervention at this time.  Patient has been going to the [facility] and has been riding the stationary bike for exercise recently.  Activities of Daily Living:  Patient reports morning stiffness for 0 minutes.   Patient Reports nocturnal pain.  Difficulty dressing/grooming: Denies Difficulty climbing stairs: Reports Difficulty getting out of chair: Reports Difficulty using hands for taps, buttons, cutlery, and/or writing: Reports  Review of Systems  Constitutional:  Negative for fatigue.  HENT:  Positive for mouth dryness. Negative for mouth sores.   Eyes:  Positive for dryness. Negative for pain.  Respiratory:  Negative for shortness of breath.   Cardiovascular:  Negative for chest pain and palpitations.  Gastrointestinal:  Negative for blood in stool, constipation and diarrhea.  Endocrine: Negative for increased urination.  Genitourinary:  Negative for involuntary urination.  Musculoskeletal:  Positive for joint pain, joint pain, joint swelling and  muscle weakness. Negative for myalgias, morning stiffness, muscle tenderness and myalgias.  Skin:  Positive for rash. Negative for color change and sensitivity to sunlight.  Allergic/Immunologic: Negative for susceptible to infections.  Neurological:  Positive for headaches. Negative for dizziness.  Hematological:  Negative for swollen glands.  Psychiatric/Behavioral:  Negative for depressed mood and sleep disturbance. The patient is not nervous/anxious.     PMFS History:  Patient Active Problem List   Diagnosis Date Noted   Aortic atherosclerosis (HCC) 04/01/2018   Chronic deep vein thrombosis (DVT) of left upper extremity (HCC) 01/17/2018   Rheumatoid arthritis of multiple sites with negative rheumatoid factor (HCC) 11/13/2017   Port-A-Cath in place 07/24/2017   Breast cancer, stage 2, right (HCC) 05/07/2017   Malignant neoplasm of central portion of right breast in female, estrogen receptor positive (HCC) 04/02/2017   Primary insomnia 09/27/2016   ANA positive 09/27/2016   Complete tear of right rotator cuff 05/11/2016   High risk medication use 05/07/2016   Uterine fibroid 12/08/2015   History of miscarriage 12/08/2015   Esophagitis 12/08/2015   Diaphragmatic hernia 12/08/2015   Calcium pyrophosphate deposition disease 12/08/2015   Osteoarthritis of both hands 12/08/2015   Osteoarthritis of both feet 12/08/2015   History of total right knee replacement 12/08/2015   Chondromalacia of both patellae 12/08/2015   DDD (degenerative disc disease), lumbar 12/08/2015   Essential hypertension 06/26/2013   Paroxysmal atrial fibrillation (HCC) 06/26/2013   Hyperlipidemia 06/26/2013   DJD (degenerative joint disease) of knee 01/26/2013   Hemorrhoids 07/11/2010  IBS (irritable bowel syndrome) 07/11/2010   Fatigue/loss of sleep 07/11/2010   Night sweats 07/11/2010   Chills 07/11/2010   Weight gain 07/11/2010   Inflammatory arthritis 07/11/2010   Incontinence of urine 07/11/2010    Thrombosed external hemorrhoids 07/11/2010    Past Medical History:  Diagnosis Date   A-fib [facility])    Arthritis    Breast cancer (HCC) 05/07/2017   Right Breast Cancer   Cancer (HCC)    Chondromalacia, patella 12/08/2015   DDD (degenerative disc disease), lumbar 12/08/2015   S/P discectomy    Diaphragmatic hernia 12/08/2015   Dysrhythmia    A-Fib   Edema of lower extremity 06/29/09   Lower Venous Exam- normal. No evidence of thrombus or thrombophlebitis.   Esophagitis 12/08/2015   Frequent urination at night    GERD (gastroesophageal reflux disease)    Hemorrhoids    History of calcium pyrophosphate deposition disease (CPPD) 12/08/2015   History of hiatal hernia    History of miscarriage 12/08/2015   x3   History of total right knee replacement 12/08/2015   Hypertension 02/24/09   Echo-EF 62%; Myocardial Perfusion Study-Normal. No signifcant ischemia noted.   IBS (irritable bowel syndrome)    Migraine    Osteoarthritis of both feet 12/08/2015   Osteoarthritis of both hands 12/08/2015   Personal history of chemotherapy 2019   Right Breast Cancer   Personal history of radiation therapy 2019   Right Breast Cancer   PONV (postoperative nausea and vomiting)    Uterine fibroid 12/08/2015    Family History  Problem Relation Age of Onset   Other Mother    Varicose Veins Mother    Emphysema Mother    Heart disease Father        heart attack   Diabetes Sister    Other Sister        pacemaker   Diabetes Sister    Heart disease Sister    Past Surgical History:  Procedure Laterality Date   ABDOMINAL HYSTERECTOMY  1974   APPENDECTOMY     BACK SURGERY  2001   BUNIONECTOMY  1986   CESAREAN SECTION  1971   COLONOSCOPY     COLONOSCOPY WITH PROPOFOL N/A 05/28/2014   Procedure: COLONOSCOPY WITH PROPOFOL;  Surgeon: Jeani Hawking, MD;  Location: [facility];  Service: Endoscopy;  Laterality: N/A;   ESOPHAGOGASTRODUODENOSCOPY (EGD) WITH PROPOFOL N/A 05/28/2014   Procedure:  ESOPHAGOGASTRODUODENOSCOPY (EGD) WITH PROPOFOL;  Surgeon: Jeani Hawking, MD;  Location: [facility];  Service: Endoscopy;  Laterality: N/A;   EXPLORATORY LAPAROTOMY     open procedure   EYE SURGERY Bilateral    cataract removal   MASTECTOMY Left 05/07/2017   PORT-A-CATH REMOVAL Right 07/17/2018   Procedure: PORT REMOVAL;  Surgeon: Harriette Bouillon, MD;  Location:  SURGERY CENTER;  Service: General;  Laterality: Right;   PORTACATH PLACEMENT Right 05/07/2017   Procedure: INSERTION PORT-A-CATH;  Surgeon: Harriette Bouillon, MD;  Location: [facility] OR;  Service: General;  Laterality: Right;   SHOULDER SURGERY Right    SIMPLE MASTECTOMY WITH AXILLARY SENTINEL NODE BIOPSY Right 05/07/2017   Procedure: RIGHT SIMPLE MASTECTOMY WITH AXILLARY SENTINEL NODE BIOPSY;  Surgeon: Harriette Bouillon, MD;  Location: [facility] OR;  Service: General;  Laterality: Right;   TOTAL KNEE ARTHROPLASTY Right 01/26/2013   DR Sherlean Foot   TOTAL KNEE ARTHROPLASTY Right 01/26/2013   Procedure: TOTAL KNEE ARTHROPLASTY;  Surgeon: Dannielle Huh, MD;  Location: [facility] OR;  Service: Orthopedics;  Laterality: Right;   UTERINE FIBROID SURGERY  [phone]  Social History   Social History Narrative   [phone] Unable to ask abuse questions husband with her today.   Immunization History  Administered Date(s) Administered   Influenza, High Dose Seasonal PF 10/07/2015, 10/19/2016, 10/25/2017   PFIZER Comirnaty(Gray Top)Covid-19 Tri-Sucrose Vaccine 04/13/2020   PFIZER(Purple Top)SARS-COV-2 Vaccination 01/25/2019, 02/13/2019, 09/15/2019   Pfizer Covid-19 Vaccine Bivalent Booster 5yrs & up 09/27/2020   Pfizer(Comirnaty)Fall Seasonal Vaccine 12 years and older 10/18/2021   Pneumococcal Conjugate-13 11/24/2013   Pneumococcal Polysaccharide-23 01/10/2003   Tdap 03/21/2017, 03/21/2017   Zoster Recombinant(Shingrix) 03/21/2017, 03/21/2017   Zoster, Live 08/01/2012     Objective: Vital Signs: BP 131/75 (BP Location: Left Arm, Patient Position: Sitting, Cuff  Size: Large)   Pulse 67   Resp 16   Ht 5\' 7"  (1.702 m)   Wt 226 lb 3.2 oz (102.6 kg)   BMI 35.43 kg/m    Physical Exam Vitals and nursing note reviewed.  Constitutional:      Appearance: She is well-developed.  HENT:     Head: Normocephalic and atraumatic.  Eyes:     Conjunctiva/sclera: Conjunctivae normal.  Cardiovascular:     Rate and Rhythm: Normal rate and regular rhythm.     Heart sounds: Normal heart sounds.  Pulmonary:     Effort: Pulmonary effort is normal.     Breath sounds: Normal breath sounds.  Abdominal:     General: Bowel sounds are normal.     Palpations: Abdomen is soft.  Musculoskeletal:     Cervical back: Normal range of motion.  Lymphadenopathy:     Cervical: No cervical adenopathy.  Skin:    General: Skin is warm and dry.     Capillary Refill: Capillary refill takes less than 2 seconds.  Neurological:     Mental Status: She is alert and oriented to person, place, and time.  Psychiatric:        Behavior: Behavior normal.      Musculoskeletal Exam: C-spine has limited range of motion with lateral rotation especially to the left.  Good flexion and extension of the C-spine.  Thoracic kyphosis noted.  Shoulder joints, elbow joints, wrist joints, MCPs, PIPs, DIPs have good range of motion with no synovitis. PIP and DIP thickening.  Right ring trigger finger.  CMC joint prominence bilaterally.  Right knee replacement has good ROM.  Left knee has good ROM with no warmth or effusion.  Ankle joints have good ROM.    CDAI Exam: CDAI Score: -- Patient Global: --; Provider Global: -- Swollen: --; Tender: -- Joint Exam 12/14/2022   No joint exam has been documented for this visit   There is currently no information documented on the homunculus. Go to the Rheumatology activity and complete the homunculus joint exam.  Investigation: No additional findings.  Imaging: No results found.  Recent Labs: Lab Results  Component Value Date   WBC 4.2 07/10/2022    HGB 14.4 07/10/2022   PLT 144 (L) 07/10/2022   NA 142 07/10/2022   K 4.0 07/10/2022   CL 104 07/10/2022   CO2 30 07/10/2022   GLUCOSE 125 (H) 07/10/2022   BUN 12 07/10/2022   CREATININE 0.91 07/10/2022   BILITOT 0.6 07/10/2022   ALKPHOS 116 07/10/2022   AST 79 (H) 07/10/2022   ALT 84 (H) 07/10/2022   PROT 6.2 (L) 07/10/2022   ALBUMIN 3.8 07/10/2022   CALCIUM 9.3 07/10/2022   GFRAA 67 08/31/2019   QFTBGOLDPLUS NEGATIVE 08/31/2019    Speciality Comments: PLQ eye exam: 08/21/18 normal. Dr.  Groat. Follow up in 1 year.  Procedures:  No procedures performed Allergies: Oxycodone, Hydrocodone, Percocet [oxycodone-acetaminophen], Soma [carisoprodol], and Ultram [tramadol hcl]   Assessment / Plan:     Visit Diagnoses: Rheumatoid arthritis of multiple sites with negative rheumatoid factor (HCC) - In remission: She has no synovitis on examination today.  She has not had any signs or symptoms of a rheumatoid arthritis flare.  She discontinued Plaquenil in August 2021 and has not had a recurrence of symptoms.  She continues to have intermittent arthralgias and joint stiffness but has not had any active inflammation.  She has been going to the [facility] for exercise on a regular basis.  She was advised to notify us if she develops signs or symptoms of a flare.  She will follow-up in the office in 6 months or sooner if needed.  Calcium pyrophosphate deposition disease - She takes colchicine 0.6 mg 1 tablet daily as needed during flares.  No signs of active inflammation noted on exam.  She has been out of her prescription for colchicine.  A refill of colchicine sent to the pharmacy today.  Strongly encourage patient to take colchicine on an as-needed basis.  Plan to update CBC and CMP today.  Medication monitoring encounter -Colchicine 0.6 mg 1 tablet daily as needed. She has been avoiding the use of tylenol due to elevated LFTs.  She is unable to take NSAIDs due to being on xarelto.  CBC and CMP updated  today.  Plan: CBC with Differential/Platelet, COMPLETE METABOLIC PANEL WITH GFR  Primary osteoarthritis of both hands: CMC, PIP, DIP thickening consistent with osteoarthritis of both hands.  Patient continues to experience intermittent rashes and joint stiffness especially in her right hand.  She is been using an arthritis compression gloves for pain relief which has been helpful.  She also uses diclofenac gel as needed.  Trigger finger, right ring finger - Cortisone injection x2. Under care of Dr. Merlyn Lot.  Intermittent locking.  Patient is not ready to proceed with surgical intervention.   Primary osteoarthritis of both feet: Patient experiences intermittent discomfort in her feet but has no active inflammation on examination today.  She is good range of motion of both ankle joints with no joint tenderness.  Plantar fasciitis, bilateral: Not currently symptomatic.  History of total right knee replacement: Chronic pain.  Good range of motion on examination today.  No effusion noted.  Not using a cane or walker to assist with ambulation.  She uses Voltaren gel topically as needed for pain relief.  Degeneration of intervertebral disc of lumbar region without discogenic back pain or lower extremity pain: Patient experiences intermittent discomfort in her lower back especially if standing for prolonged periods of time.   Chronic right SI joint pain:Not currently symptomatic.   Osteopenia of multiple sites - DEXA updated on 10/31/2020:The BMD measured at Forearm Radius 33% is 0.671 g/cm2 with a T-score of -2.4.  DEXA ordered by Dr. Darnelle Catalan. She is taking a daily calcium and vitamin D supplement.   Other medical conditions are listed as follows:   Pure hypercholesterolemia  Essential hypertension: BP was 131/75 today in the office.   Paroxysmal atrial fibrillation (HCC)  History of DVT (deep vein thrombosis)  Malignant neoplasm of central portion of right breast in female, estrogen receptor  positive (HCC)   Orders: Orders Placed This Encounter  Procedures   CBC with Differential/Platelet   COMPLETE METABOLIC PANEL WITH GFR   Meds ordered this encounter  Medications   colchicine 0.6  MG tablet    Sig: TAKE 1 TABLET BY MOUTH  DAILY AS NEEDED AS DIRECTED    Dispense:  90 tablet    Refill:  0    Follow-Up Instructions: Return in about 6 months (around 06/14/2023) for Rheumatoid arthritis.   Gearldine Bienenstock, PA-C  Note - This record has been created using Dragon software.  Chart creation errors have been sought, but may not always  have been located. Such creation errors do not reflect on  the standard of medical care.

## 2022-12-14 ENCOUNTER — Ambulatory Visit: Payer: Medicare Other | Attending: Physician Assistant | Admitting: Physician Assistant

## 2022-12-14 ENCOUNTER — Encounter: Payer: Self-pay | Admitting: Physician Assistant

## 2022-12-14 VITALS — BP 131/75 | HR 67 | Resp 16 | Ht 67.0 in | Wt 226.2 lb

## 2022-12-14 DIAGNOSIS — M65341 Trigger finger, right ring finger: Secondary | ICD-10-CM

## 2022-12-14 DIAGNOSIS — I480 Paroxysmal atrial fibrillation: Secondary | ICD-10-CM

## 2022-12-14 DIAGNOSIS — M19042 Primary osteoarthritis, left hand: Secondary | ICD-10-CM

## 2022-12-14 DIAGNOSIS — M1120 Other chondrocalcinosis, unspecified site: Secondary | ICD-10-CM

## 2022-12-14 DIAGNOSIS — M8589 Other specified disorders of bone density and structure, multiple sites: Secondary | ICD-10-CM

## 2022-12-14 DIAGNOSIS — M722 Plantar fascial fibromatosis: Secondary | ICD-10-CM

## 2022-12-14 DIAGNOSIS — M533 Sacrococcygeal disorders, not elsewhere classified: Secondary | ICD-10-CM

## 2022-12-14 DIAGNOSIS — E7800 Pure hypercholesterolemia, unspecified: Secondary | ICD-10-CM

## 2022-12-14 DIAGNOSIS — M19041 Primary osteoarthritis, right hand: Secondary | ICD-10-CM

## 2022-12-14 DIAGNOSIS — M0609 Rheumatoid arthritis without rheumatoid factor, multiple sites: Secondary | ICD-10-CM | POA: Diagnosis not present

## 2022-12-14 DIAGNOSIS — I10 Essential (primary) hypertension: Secondary | ICD-10-CM

## 2022-12-14 DIAGNOSIS — M51369 Other intervertebral disc degeneration, lumbar region without mention of lumbar back pain or lower extremity pain: Secondary | ICD-10-CM

## 2022-12-14 DIAGNOSIS — G8929 Other chronic pain: Secondary | ICD-10-CM

## 2022-12-14 DIAGNOSIS — M19072 Primary osteoarthritis, left ankle and foot: Secondary | ICD-10-CM

## 2022-12-14 DIAGNOSIS — Z96651 Presence of right artificial knee joint: Secondary | ICD-10-CM

## 2022-12-14 DIAGNOSIS — Z86718 Personal history of other venous thrombosis and embolism: Secondary | ICD-10-CM

## 2022-12-14 DIAGNOSIS — M19071 Primary osteoarthritis, right ankle and foot: Secondary | ICD-10-CM

## 2022-12-14 DIAGNOSIS — Z5181 Encounter for therapeutic drug level monitoring: Secondary | ICD-10-CM

## 2022-12-14 DIAGNOSIS — C50111 Malignant neoplasm of central portion of right female breast: Secondary | ICD-10-CM

## 2022-12-14 DIAGNOSIS — Z170 Estrogen receptor positive status [ER+]: Secondary | ICD-10-CM

## 2022-12-14 MED ORDER — COLCHICINE 0.6 MG PO TABS: ORAL_TABLET | ORAL | 0 refills | Status: DC

## 2022-12-15 LAB — CBC WITH DIFFERENTIAL/PLATELET
Absolute Lymphocytes: 3097 {cells}/uL (ref 850–3900)
Absolute Monocytes: 371 {cells}/uL (ref 200–950)
Basophils Absolute: 52 {cells}/uL (ref 0–200)
Basophils Relative: 0.9 %
Eosinophils Absolute: 81 {cells}/uL (ref 15–500)
Eosinophils Relative: 1.4 %
HCT: 46.2 % — ABNORMAL HIGH (ref 35.0–45.0)
Hemoglobin: 14.6 g/dL (ref 11.7–15.5)
MCH: 26.8 pg — ABNORMAL LOW (ref 27.0–33.0)
MCHC: 31.6 g/dL — ABNORMAL LOW (ref 32.0–36.0)
MCV: 84.9 fL (ref 80.0–100.0)
MPV: 11.4 fL (ref 7.5–12.5)
Monocytes Relative: 6.4 %
Neutro Abs: 2198 {cells}/uL (ref 1500–7800)
Neutrophils Relative %: 37.9 %
Platelets: 182 10*3/uL (ref 140–400)
RBC: 5.44 10*6/uL — ABNORMAL HIGH (ref 3.80–5.10)
RDW: 12.7 % (ref 11.0–15.0)
Total Lymphocyte: 53.4 %
WBC: 5.8 10*3/uL (ref 3.8–10.8)

## 2022-12-15 LAB — COMPLETE METABOLIC PANEL WITH GFR
AG Ratio: 1.4 (calc) (ref 1.0–2.5)
ALT: 74 U/L — ABNORMAL HIGH (ref 6–29)
AST: 74 U/L — ABNORMAL HIGH (ref 10–35)
Albumin: 4 g/dL (ref 3.6–5.1)
Alkaline phosphatase (APISO): 121 U/L (ref 37–153)
BUN: 16 mg/dL (ref 7–25)
CO2: 29 mmol/L (ref 20–32)
Calcium: 10.2 mg/dL (ref 8.6–10.4)
Chloride: 103 mmol/L (ref 98–110)
Creat: 0.87 mg/dL (ref 0.60–0.95)
Globulin: 2.8 g/dL (ref 1.9–3.7)
Glucose, Bld: 115 mg/dL — ABNORMAL HIGH (ref 65–99)
Potassium: 4.3 mmol/L (ref 3.5–5.3)
Sodium: 142 mmol/L (ref 135–146)
Total Bilirubin: 0.5 mg/dL (ref 0.2–1.2)
Total Protein: 6.8 g/dL (ref 6.1–8.1)
eGFR: 65 mL/min/{1.73_m2} (ref >=60)

## 2022-12-16 NOTE — Progress Notes (Signed)
CBC stable.  Glucose is 115.   AST and ALT remain elevated--stable. Please notify the patient and forward results to GI as requested.

## 2023-01-25 ENCOUNTER — Other Ambulatory Visit (HOSPITAL_COMMUNITY): Payer: Self-pay | Admitting: Cardiology

## 2023-01-29 ENCOUNTER — Other Ambulatory Visit: Payer: Self-pay | Admitting: Hematology and Oncology

## 2023-03-28 ENCOUNTER — Other Ambulatory Visit (HOSPITAL_COMMUNITY): Payer: Self-pay | Admitting: Cardiology

## 2023-04-23 ENCOUNTER — Other Ambulatory Visit: Payer: Self-pay | Admitting: Hematology and Oncology

## 2023-04-24 ENCOUNTER — Other Ambulatory Visit: Payer: Self-pay

## 2023-04-24 ENCOUNTER — Encounter (HOSPITAL_COMMUNITY): Payer: Self-pay | Admitting: Emergency Medicine

## 2023-04-24 ENCOUNTER — Emergency Department (HOSPITAL_COMMUNITY)

## 2023-04-24 ENCOUNTER — Emergency Department (HOSPITAL_COMMUNITY)
Admission: EM | Admit: 2023-04-24 | Discharge: 2023-04-24 | Disposition: A | Discharge status: Home / Self Care | Attending: Emergency Medicine | Admitting: Emergency Medicine

## 2023-04-24 DIAGNOSIS — Z7901 Long term (current) use of anticoagulants: Secondary | ICD-10-CM | POA: Insufficient documentation

## 2023-04-24 DIAGNOSIS — R42 Dizziness and giddiness: Secondary | ICD-10-CM | POA: Diagnosis present

## 2023-04-24 DIAGNOSIS — Z853 Personal history of malignant neoplasm of breast: Secondary | ICD-10-CM | POA: Diagnosis not present

## 2023-04-24 LAB — URINALYSIS, ROUTINE W REFLEX MICROSCOPIC
Bilirubin Urine: NEGATIVE
Glucose, UA: NEGATIVE mg/dL
Hgb urine dipstick: NEGATIVE
Ketones, ur: NEGATIVE mg/dL
Nitrite: NEGATIVE
Protein, ur: NEGATIVE mg/dL
Specific Gravity, Urine: 1.011 (ref 1.005–1.030)
pH: 6 (ref 5.0–8.0)

## 2023-04-24 LAB — CBC WITH DIFFERENTIAL/PLATELET
Abs Immature Granulocytes: 0.04 10*3/uL (ref 0.00–0.07)
Basophils Absolute: 0.1 10*3/uL (ref 0.0–0.1)
Basophils Relative: 1 %
Eosinophils Absolute: 0.1 10*3/uL (ref 0.0–0.5)
Eosinophils Relative: 2 %
HCT: 48.4 % — ABNORMAL HIGH (ref 36.0–46.0)
Hemoglobin: 15.7 g/dL — ABNORMAL HIGH (ref 12.0–15.0)
Immature Granulocytes: 1 %
Lymphocytes Relative: 57 %
Lymphs Abs: 3.6 10*3/uL (ref 0.7–4.0)
MCH: 27.8 pg (ref 26.0–34.0)
MCHC: 32.4 g/dL (ref 30.0–36.0)
MCV: 85.7 fL (ref 80.0–100.0)
Monocytes Absolute: 0.5 10*3/uL (ref 0.1–1.0)
Monocytes Relative: 9 %
Neutro Abs: 1.8 10*3/uL (ref 1.7–7.7)
Neutrophils Relative %: 30 %
Platelets: UNDETERMINED 10*3/uL (ref 150–400)
RBC: 5.65 MIL/uL — ABNORMAL HIGH (ref 3.87–5.11)
RDW: 14.6 % (ref 11.5–15.5)
WBC: 6.1 10*3/uL (ref 4.0–10.5)
nRBC: 1.3 % — ABNORMAL HIGH (ref 0.0–0.2)

## 2023-04-24 MED ORDER — MECLIZINE HCL 25 MG PO TABS
25.0000 mg | ORAL_TABLET | Freq: Once | ORAL | Status: AC
  Administered 2023-04-24: 25 mg via ORAL
  Filled 2023-04-24: qty 1

## 2023-04-24 NOTE — ED Provider Notes (Signed)
 [place] EMERGENCY DEPARTMENT AT [facility] Provider Note   CSN: [id number] Arrival date & time: 04/24/23  1336     History  Chief Complaint  Patient presents with   Dizziness    Tara Cox is a 89 y.o. female history of A-fib on Xarelto, chronic DVT of left upper extremity, rheumatoid arthritis, breast cancer presented for lightheadedness that began today at 80 AM.  Patient states she notices this when going from a sitting to a standing position but notes that after a while she is able to walk.  Patient denies any tinnitus, recent illnesses, nausea vomiting, chest pain shortness of breath, vision changes, neck pain or headache.  Patient states this did happen again on Sunday but surely resolved.  Patient to be able to ambulate out difficulty not denies any paresthesias or weakness.  Patient states that her liver enzymes were elevated at her outpatient visit and she was told to take in less carbohydrates and so she thinks that this also could be contributing to her dizziness.  Patient denies any dysuria or fevers.  Home Medications Prior to Admission medications   Medication Sig Start Date End Date Taking? Authorizing Provider  acetaminophen (TYLENOL) 650 MG CR tablet Take 650 mg by mouth every 8 (eight) hours as needed for pain.    [provider]  Ascorbic Acid (VITAMIN C WITH ROSE HIPS) 500 MG tablet Take 500 mg by mouth daily.    [provider]  atorvastatin (LIPITOR) 20 MG tablet Take 20 mg by mouth at bedtime.     [provider]  benzonatate (TESSALON) 200 MG capsule Take 200 mg by mouth 3 (three) times daily as needed for cough.    [provider]  Calcium Carbonate-Vit D-Min (CALCIUM 600+D3 PLUS MINERALS) 600-800 MG-UNIT TABS Take 1 tablet by mouth at bedtime.    [provider]  cetirizine (ZYRTEC) 10 MG tablet Take 10 mg by mouth as needed for allergies.    [provider]  colchicine 0.6 MG tablet  TAKE 1 TABLET BY MOUTH  DAILY AS NEEDED AS DIRECTED 12/14/22   Gearldine Bienenstock, PA-C  Diclofenac Sodium 1 % CREA Apply topically. As needed    [provider]  diphenhydrAMINE HCl (BENADRYL PO) Take by mouth as needed.    [provider]  furosemide (LASIX) 20 MG tablet Take 20 mg by mouth daily.    [provider]  gabapentin (NEURONTIN) 300 MG capsule Take 300 mg by mouth 2 (two) times daily.     [provider]  hydrocortisone (ANUSOL-HC) 2.5 % rectal cream Place 1 application rectally 2 (two) times daily as needed for hemorrhoids or itching.    [provider]  losartan-hydrochlorothiazide (HYZAAR) 100-25 MG tablet Take 1 tablet by mouth daily.  04/10/18   [provider]  magnesium oxide (MAG-OX) 400 MG tablet Take 400 mg by mouth daily.     [provider]  metoprolol succinate (TOPROL-XL) 50 MG 24 hr tablet TAKE 1.5 TABLETS (75 MG TOTAL) BY MOUTH DAILY. [facility] FOLLOW UP APPOINTMENT FOR MORE REFILLS 03/28/23   Laurey Morale, MD  Multiple Vitamin (MULTIVITAMIN) tablet Take 1 tablet by mouth daily. CENTRUM    [provider]  nystatin (MYCOSTATIN) 100000 UNIT/ML suspension Take 5 mLs by mouth as needed. Patient not taking: Reported on 12/14/2022    [provider]  Omega-3 Fatty Acids (OMEGA 3 PO) Take 1,000 Units by mouth daily.    [provider]  omeprazole (PRILOSEC) 40 MG capsule Take 40 mg by mouth daily as needed (FOR ACID REFLUX/INDIGESTION).  06/23/14   [provider]  potassium chloride SA (K-DUR,KLOR-CON) 20 MEQ tablet Take 10 mEq by mouth daily.    [provider]  triamcinolone cream (KENALOG) 0.1 % Apply 1 application topically 2 (two) times daily as needed (Vaginal Itching).    [provider]  XARELTO 20 MG TABS tablet TAKE 1 TABLET BY MOUTH DAILY WITH SUPPER 04/23/23   Rachel Moulds, MD      Allergies    Oxycodone, Hydrocodone, Percocet [oxycodone-acetaminophen],  Soma [carisoprodol], and Ultram [tramadol hcl]    Review of Systems   Review of Systems  Neurological:  Positive for dizziness.    Physical Exam Updated Vital Signs BP (!) 167/76 (BP Location: Right Arm)   Pulse 60   Temp 97.7 F (36.5 C) (Oral)   Resp 18   SpO2 100%  Physical Exam Constitutional:      General: She is not in acute distress. HENT:     Right Ear: Tympanic membrane, ear canal and external ear normal.     Left Ear: Tympanic membrane, ear canal and external ear normal.  Eyes:     Extraocular Movements: Extraocular movements intact.     Conjunctiva/sclera: Conjunctivae normal.     Pupils: Pupils are equal, round, and reactive to light.  Cardiovascular:     Rate and Rhythm: Normal rate and regular rhythm.     Pulses: Normal pulses.     Heart sounds: Normal heart sounds.  Pulmonary:     Effort: Pulmonary effort is normal.     Breath sounds: Normal breath sounds.  Abdominal:     Palpations: Abdomen is soft.     Tenderness: There is no abdominal tenderness. There is no guarding or rebound.  Musculoskeletal:        General: Normal range of motion.     Cervical back: Normal range of motion and neck supple.  Skin:    General: Skin is warm and dry.     Capillary Refill: Capillary refill takes less than 2 seconds.  Neurological:     General: No focal deficit present.     Mental Status: She is alert and oriented to person, place, and time.     Sensory: Sensation is intact.     Motor: Motor function is intact.     Coordination: Coordination is intact.     Gait: Gait is intact.     Comments: Sensation intact in all 4 extremities Able to ambulate without difficulty but does states she gets lightheaded when going from a sitting to a standing position No nystagmus noted Cranial nerves III through XII intact Vision grossly intact  Psychiatric:        Mood and Affect: Mood normal.     ED Results / Procedures / Treatments   Labs (all labs ordered are listed, but  only abnormal results are displayed) Labs Reviewed  CBC WITH DIFFERENTIAL/PLATELET  URINALYSIS, ROUTINE W REFLEX MICROSCOPIC  COMPREHENSIVE METABOLIC PANEL WITH GFR    EKG EKG Interpretation Date/Time:  Wednesday April 24 2023 13:47:04 EDT Ventricular Rate:  58 PR Interval:  164 QRS Duration:  86 QT Interval:  414 QTC Calculation: 406 R Axis:   5  Text Interpretation: Sinus bradycardia with sinus arrhythmia Otherwise normal ECG No significant change since last tracing Confirmed by Melene Plan [phone]) on 04/24/2023 2:00:52 PM  Radiology No results found.  Procedures Procedures    Medications Ordered in  ED Medications  meclizine (ANTIVERT) tablet 25 mg (25 mg Oral Given 04/24/23 1429)    ED Course/ Medical Decision Making/ A&P                                 Medical Decision Making Amount and/or Complexity of Data Reviewed Radiology: ordered.   Tara Cox 89 y.o. presented today for dizziness. Working DDx that I considered at this time includes, but not limited to, CVA/TIA, arrhythmia, CNS lesion, BPPV, vestibular labyrinthitis, Meniere's, Ramsey-Hunt, polypharmacy, orthostatic hypotension, electrolyte abnormalities.  R/o DDx: Pending  Review of prior external notes: 01/25/2023 clinical support  Unique Tests and My Independent Interpretation:  EKG: Sinus 58 bpm, no signs of ischemia or right heart strain CBC: Unremarkable CMP: Pending UA: Pending CT Head w/o Contrast: Pending  Social Determinants of Health: none  Discussion with Independent Historian:  Husband  Discussion of Management of Tests: None  Risk: Medium: prescription drug management  Risk Stratification Score: None  Plan: On exam patient was no acute distress with stable vitals. Physical exam showed no acute findings but patient does states she gets lightheaded when going from a lying down or sitting position to an upright position or standing suspicious of orthostatic hypotension.   Patient is on blood thinners and has been compliant and denies any trauma to the head however given that she is on blood thinners and described lightheadedness will get CT scan to rule out any kind of hematoma that may have formed.  Will give patient meclizine here and orthostatics but do anticipate patient will be discharged to patient satisfaction.  Patient signed out to [place], PA-C.  Please review their note for the continuation of patient's care.  The plan at this point is follow-up on labs and imaging.  This chart was dictated using voice recognition software.  Despite best efforts to proofread,  errors can occur which can change the documentation meaning.        Final Clinical Impression(s) / ED Diagnoses Final diagnoses:  None    Rx / DC Orders ED Discharge Orders     None         Elex Grimmer 04/24/23 1509    Trish Furl, MD 04/25/23 [phone]

## 2023-04-24 NOTE — ED Notes (Signed)
 This RN stuck patient with no success.  Phlebotomy to come to bedside for blood work.

## 2023-04-24 NOTE — ED Provider Triage Note (Signed)
 Emergency Medicine Provider Triage Evaluation Note  Tara Cox , a 89 y.o. female  was evaluated in triage.  Pt complains of dizziness.  Patient reports intermittent dizziness for the past 4 days.  Symptoms are worse with head movement.  States that she recently was advised to make diet changes after having all of liver enzymes.  States that she has been into that she has been drinking a lot of water today either.  Was at church today when she felt dizzy.  They called EMS.  Review of Systems  Positive: Lightheadedness Negative: Head injury, focal neurologic deficit  Physical Exam  There were no vitals taken for this visit. Gen:   Awake, no distress   Resp:  Normal effort MSK:   Moves extremities without difficulty  Other:  Able to ambulate independently in triage  Medical Decision Making  Medically screening exam initiated at 1:51 PM.  Appropriate orders placed.  Tara Cox was informed that the remainder of the evaluation will be completed by another provider, this initial triage assessment does not replace that evaluation, and the importance of remaining in the ED until their evaluation is complete.   Sonnie Dusky, PA-C 04/24/23 1355

## 2023-04-24 NOTE — ED Triage Notes (Signed)
 Pt BIB by [facility] for dizziness since Sunday morning. Endorses generalized weakness. No focal neurological deficit appreciated. Denies any N/V/D  EMS VS BP 130/74 HR 60 NSR CBG 165

## 2023-04-24 NOTE — Discharge Instructions (Addendum)
 Your evaluated for your symptoms.  CT scan of your head did not show any concerning finding.  Your blood count was obtained and fortunately no evidence of anemia.  Your EKG did not show any concerning abnormal heart rhythm.  Unfortunately we were unable to obtain a comprehensive metabolic panel to check for your blood sugar your kidney function and your electrolytes.  However, you may follow-up closely with your doctor for labs rechecked.  Stay hydrated by drinking plenty of fluid but do not hesitate to return to the ER if you have any concern.

## 2023-04-24 NOTE — ED Provider Notes (Signed)
 Received signout from previous provider, please see his note for complete H&P.  This is an 89 year old female significant history of atrial fibrillation on Xarelto, rheumatoid arthritis, breast cancer, who endorsed having intermittent episodes of dizziness/lightheadedness.  Symptom ongoing for at least 3 days, sporadic, sometimes brought on by positional change.  She denies any room spinning sensation and denies any associated pain.  Initial blood work shows a hemoglobin of 15.7 likely hemoconcentration but no findings suggestive of anemia.  Additional blood work including CMP was ordered but patient is a difficult vascular access and the staff was unable to obtain the appropriate labs.  Patient however states she feels fine at this time she prefers to go home and drink plenty of fluid and will follow-up closely with her PCP for her symptoms.  I have patient ambulate and she has a nice steady gait.  She is without any focal neurodeficit.  She is overall mentating appropriately.  She denies any urinary symptoms  Patient understand that we did not obtain her electrolyte panel but due to improvement of symptoms and patient feels comfortable going home, I gave patient strict return precaution.  At this time have low suspicion for posterior circulation stroke, or other central cause of her symptoms.  Her EKG independently viewed inter by me shows sinus brady without any concerning arrhythmia.  BP (!) 164/70 (BP Location: Left Arm)   Pulse 61   Temp 97.9 F (36.6 C) (Oral)   Resp 18   SpO2 100%   Results for orders placed or performed during the hospital encounter of 04/24/23  CBC with Differential   Collection Time: 04/24/23  2:15 PM  Result Value Ref Range   WBC 6.1 4.0 - 10.5 K/uL   RBC 5.65 (H) 3.87 - 5.11 MIL/uL   Hemoglobin 15.7 (H) 12.0 - 15.0 g/dL   HCT 16.1 (H) 09.6 - 04.5 %   MCV 85.7 80.0 - 100.0 fL   MCH 27.8 26.0 - 34.0 pg   MCHC 32.4 30.0 - 36.0 g/dL   RDW 40.9 81.1 - 91.4 %    Platelets PLATELET CLUMPS NOTED ON SMEAR, UNABLE TO ESTIMATE 150 - 400 K/uL   nRBC 1.3 (H) 0.0 - 0.2 %   Neutrophils Relative % 30 %   Neutro Abs 1.8 1.7 - 7.7 K/uL   Lymphocytes Relative 57 %   Lymphs Abs 3.6 0.7 - 4.0 K/uL   Monocytes Relative 9 %   Monocytes Absolute 0.5 0.1 - 1.0 K/uL   Eosinophils Relative 2 %   Eosinophils Absolute 0.1 0.0 - 0.5 K/uL   Basophils Relative 1 %   Basophils Absolute 0.1 0.0 - 0.1 K/uL   Immature Granulocytes 1 %   Abs Immature Granulocytes 0.04 0.00 - 0.07 K/uL   CT Head Wo Contrast Result Date: 04/24/2023 CLINICAL DATA:  Lightheadedness, dizziness, on anticoagulation. EXAM: CT HEAD WITHOUT CONTRAST TECHNIQUE: Contiguous axial images were obtained from the base of the skull through the vertex without intravenous contrast. RADIATION DOSE REDUCTION: This exam was performed according to the departmental dose-optimization program which includes automated exposure control, adjustment of the mA and/or kV according to patient size and/or use of iterative reconstruction technique. COMPARISON:  MRI head 02/28/2022. FINDINGS: Brain: No acute intracranial hemorrhage. No CT evidence of acute infarct. Nonspecific hypoattenuation in the periventricular and subcortical white matter favored to reflect chronic microvascular ischemic changes. No edema, mass effect, or midline shift. The basilar cisterns are patent. Ventricles: The ventricles are normal. Vascular: Atherosclerotic calcifications of the carotid  siphons and intracranial vertebral arteries. No hyperdense vessel. Skull: No acute or aggressive finding. Orbits: Orbits are symmetric. Sinuses: The visualized paranasal sinuses are clear. Other: Mastoid air cells are clear. IMPRESSION: No CT evidence of acute intracranial abnormality. Mild chronic microvascular ischemic changes. Electronically Signed   By: Denny Flack M.D.   On: 04/24/2023 15:27      Debbra Fairy, PA-C 04/24/23 1627    Wynetta Heckle, MD 04/24/23  [phone]

## 2023-04-24 NOTE — ED Notes (Signed)
 Phlebotomy at bedside.

## 2023-04-24 NOTE — ED Notes (Signed)
 Unable to obtain lab for chem 8.

## 2023-05-14 ENCOUNTER — Other Ambulatory Visit: Payer: Self-pay | Admitting: Physician Assistant

## 2023-05-14 NOTE — Telephone Encounter (Signed)
 Last Fill: 12/14/2022  Labs: 04/25/2023 Glucose 106, RBC 5.2, MCH 27, MCHC 32.3, Neutrophil Absolute 1.6  Next Visit: 06/14/2023  Last Visit: 12/14/2022  DX: Calcium  pyrophosphate deposition disease   Current Dose per office note 12/14/2022: Colchicine  0.6 mg 1 tablet daily as needed   Okay to refill Colchicine ?

## 2023-05-20 ENCOUNTER — Other Ambulatory Visit: Payer: Self-pay | Admitting: Hematology and Oncology

## 2023-05-20 DIAGNOSIS — Z1231 Encounter for screening mammogram for malignant neoplasm of breast: Secondary | ICD-10-CM
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
## 2023-06-14 ENCOUNTER — Encounter: Payer: Self-pay | Admitting: Rheumatology

## 2023-06-14 ENCOUNTER — Ambulatory Visit: Payer: Medicare Other | Attending: Rheumatology | Admitting: Rheumatology

## 2023-06-14 VITALS — BP 116/67 | HR 56 | Resp 14 | Ht 67.0 in | Wt 222.0 lb

## 2023-06-14 DIAGNOSIS — I480 Paroxysmal atrial fibrillation: Secondary | ICD-10-CM

## 2023-06-14 DIAGNOSIS — M19072 Primary osteoarthritis, left ankle and foot: Secondary | ICD-10-CM

## 2023-06-14 DIAGNOSIS — M19042 Primary osteoarthritis, left hand: Secondary | ICD-10-CM

## 2023-06-14 DIAGNOSIS — M1120 Other chondrocalcinosis, unspecified site: Secondary | ICD-10-CM | POA: Diagnosis not present

## 2023-06-14 DIAGNOSIS — Z5181 Encounter for therapeutic drug level monitoring: Secondary | ICD-10-CM

## 2023-06-14 DIAGNOSIS — G8929 Other chronic pain: Secondary | ICD-10-CM

## 2023-06-14 DIAGNOSIS — M0609 Rheumatoid arthritis without rheumatoid factor, multiple sites: Secondary | ICD-10-CM | POA: Diagnosis not present

## 2023-06-14 DIAGNOSIS — Z96651 Presence of right artificial knee joint: Secondary | ICD-10-CM

## 2023-06-14 DIAGNOSIS — R42 Dizziness and giddiness: Secondary | ICD-10-CM

## 2023-06-14 DIAGNOSIS — M722 Plantar fascial fibromatosis: Secondary | ICD-10-CM

## 2023-06-14 DIAGNOSIS — Z86718 Personal history of other venous thrombosis and embolism: Secondary | ICD-10-CM

## 2023-06-14 DIAGNOSIS — I10 Essential (primary) hypertension: Secondary | ICD-10-CM

## 2023-06-14 DIAGNOSIS — Z170 Estrogen receptor positive status [ER+]: Secondary | ICD-10-CM

## 2023-06-14 DIAGNOSIS — M19071 Primary osteoarthritis, right ankle and foot: Secondary | ICD-10-CM

## 2023-06-14 DIAGNOSIS — M65341 Trigger finger, right ring finger: Secondary | ICD-10-CM

## 2023-06-14 DIAGNOSIS — C50111 Malignant neoplasm of central portion of right female breast: Secondary | ICD-10-CM

## 2023-06-14 DIAGNOSIS — M51369 Other intervertebral disc degeneration, lumbar region without mention of lumbar back pain or lower extremity pain: Secondary | ICD-10-CM

## 2023-06-14 DIAGNOSIS — M8589 Other specified disorders of bone density and structure, multiple sites: Secondary | ICD-10-CM

## 2023-06-14 DIAGNOSIS — E7800 Pure hypercholesterolemia, unspecified: Secondary | ICD-10-CM

## 2023-06-14 DIAGNOSIS — M19041 Primary osteoarthritis, right hand: Secondary | ICD-10-CM

## 2023-06-14 MED ORDER — DICLOFENAC SODIUM 3 % EX GEL: 4.0000 g | Freq: Two times a day (BID) | CUTANEOUS | 2 refills | Status: AC

## 2023-06-14 NOTE — Patient Instructions (Addendum)
 Please get  DEXA scan through your PCP.   Exercises for Chronic Knee Pain Chronic knee pain is pain that lasts longer than 3 months. For most people with chronic knee pain, exercise and weight loss is an important part of treatment. Your health care provider may want you to focus on: Making the muscles that support your knee stronger. This can take pressure off your knee and reduce pain. Preventing knee stiffness. How far you can move your knee, keeping it there or making it farther. Losing weight (if this applies) to take pressure off your knee, lower your risk for injury, and make it easier for you to exercise. Your provider will help you make an exercise program that fits your needs and physical abilities. Below are simple, low-impact exercises you can do at home. Ask your provider or physical therapist how often you should do your exercise program and how many times to repeat each exercise. General safety tips  Get your provider's approval before doing any exercises. Start slowly and stop any time you feel pain. Do not exercise if your knee pain is flaring up. Warm up first. Stretching a cold muscle can cause an injury. Do 5-10 minutes of easy movement or light stretching before beginning your exercises. Do 5-10 minutes of low-impact activity (like walking or cycling) before starting strengthening exercises. Contact your provider any time you have pain during or after exercising. Exercise can cause discomfort but should not be painful. It is normal to be a little stiff or sore after exercising. Stretching and range-of-motion exercises Front thigh stretch  Stand up straight and support your body by holding on to a chair or resting one hand on a wall. With your legs straight and close together, bend one knee to lift your heel up toward your butt. Using one hand for support, grab your ankle with your free hand. Pull your foot up closer toward your butt to feel the stretch in front of your  thigh. Hold the stretch for 30 seconds. Repeat __________ times. Complete this exercise __________ times a day. Back thigh stretch  Sit on the floor with your back straight and your legs out straight in front of you. Place the palms of your hands on the floor and slide them toward your feet as you bend at the hip. Try to touch your nose to your knees and feel the stretch in the back of your thighs. Hold for 30 seconds. Repeat __________ times. Complete this exercise __________ times a day. Calf stretch  Stand facing a wall. Place the palms of your hands flat against the wall, arms extended, and lean slightly against the wall. Get into a lunge position with one leg bent at the knee and the other leg stretched out straight behind you. Keep both feet facing the wall and increase the bend in your knee while keeping the heel of the other leg flat on the ground. You should feel the stretch in your calf. Hold for 30 seconds. Repeat __________ times. Complete this exercise __________ times a day. Strengthening exercises Straight leg lift  Lie on your back with one knee bent and the other leg out straight. Slowly lift the straight leg without bending the knee. Lift until your foot is about 12 inches (30 cm) off the floor. Hold for 3-5 seconds and slowly lower your leg. Repeat __________ times. Complete this exercise __________ times a day. Single leg dip  Stand between two chairs and put both hands on the backs of the chairs  for support. Extend one leg out straight with your body weight resting on the heel of the standing leg. Slowly bend your standing knee to dip your body to the level that is comfortable for you. Hold for 3-5 seconds. Repeat __________ times. Complete this exercise __________ times a day. Hamstring curls  Stand straight, knees close together, facing the back of a chair. Hold on to the back of a chair with both hands. Keep one leg straight. Bend the other knee while  bringing the heel up toward the butt until the knee is bent at a 90-degree angle (right angle). Hold for 3-5 seconds. Repeat __________ times. Complete this exercise __________ times a day. Wall squat  Stand straight with your back, hips, and head against a wall. Step forward one foot at a time with your back still against the wall. Your feet should be 2 feet (61 cm) from the wall at shoulder width. Keeping your back, hips, and head against the wall, slide down the wall to as close to a sitting position as you can get. Hold for 5-10 seconds, then slowly slide back up. Repeat __________ times. Complete this exercise __________ times a day. Step-ups  Stand in front of a sturdy platform or stool that is about 6 inches (15 cm) high. Slowly step up with your left / right foot, keeping your knee in line with your hip and foot. Do not let your knee bend so far that you cannot see your toes. Hold on to a chair for balance, but do not use it for support. Slowly unlock your knee and lower yourself to the starting position. Repeat __________ times. Complete this exercise __________ times a day. Contact a health care provider if: Your exercises cause pain. Your pain is worse after you exercise. Your pain prevents you from doing your exercises. This information is not intended to replace advice given to you by your health care provider. Make sure you discuss any questions you have with your health care provider. Document Revised: 01/09/2022 Document Reviewed: 01/09/2022 Elsevier Patient Education  2024 [employer]  .Low Back Sprain or Strain Rehab Ask your health care provider which exercises are safe for you. Do exercises exactly as told by your health care provider and adjust them as directed. It is normal to feel mild stretching, pulling, tightness, or discomfort as you do these exercises. Stop right away if you feel sudden pain or your pain gets worse. Do not begin these exercises until told by  your health care provider. Stretching and range-of-motion exercises These exercises warm up your muscles and joints and improve the movement and flexibility of your back. These exercises also help to relieve pain, numbness, and tingling. Lumbar rotation  Lie on your back on a firm bed or the floor with your knees bent. Straighten your arms out to your sides so each arm forms a 90-degree angle (right angle) with a side of your body. Slowly move (rotate) both of your knees to one side of your body until you feel a stretch in your lower back (lumbar). Try not to let your shoulders lift off the floor. Hold this position for __________ seconds. Tense your abdominal muscles and slowly move your knees back to the starting position. Repeat this exercise on the other side of your body. Repeat __________ times. Complete this exercise __________ times a day. Single knee to chest  Lie on your back on a firm bed or the floor with both legs straight. Bend one of your knees.  Use your hands to move your knee up toward your chest until you feel a gentle stretch in your lower back and buttock. Hold your leg in this position by holding on to the front of your knee. Keep your other leg as straight as possible. Hold this position for __________ seconds. Slowly return to the starting position. Repeat with your other leg. Repeat __________ times. Complete this exercise __________ times a day. Prone extension on elbows  Lie on your abdomen on a firm bed or the floor (prone position). Prop yourself up on your elbows. Use your arms to help lift your chest up until you feel a gentle stretch in your abdomen and your lower back. This will place some of your body weight on your elbows. If this is uncomfortable, try stacking pillows under your chest. Your hips should stay down, against the surface that you are lying on. Keep your hip and back muscles relaxed. Hold this position for __________ seconds. Slowly relax  your upper body and return to the starting position. Repeat __________ times. Complete this exercise __________ times a day. Strengthening exercises These exercises build strength and endurance in your back. Endurance is the ability to use your muscles for a long time, even after they get tired. Pelvic tilt This exercise strengthens the muscles that lie deep in the abdomen. Lie on your back on a firm bed or the floor with your legs extended. Bend your knees so they are pointing toward the ceiling and your feet are flat on the floor. Tighten your lower abdominal muscles to press your lower back against the floor. This motion will tilt your pelvis so your tailbone points up toward the ceiling instead of pointing to your feet or the floor. To help with this exercise, you may place a small towel under your lower back and try to push your back into the towel. Hold this position for __________ seconds. Let your muscles relax completely before you repeat this exercise. Repeat __________ times. Complete this exercise __________ times a day. Alternating arm and leg raises  Get on your hands and knees on a firm surface. If you are on a hard floor, you may want to use padding, such as an exercise mat, to cushion your knees. Line up your arms and legs. Your hands should be directly below your shoulders, and your knees should be directly below your hips. Lift your left leg behind you. At the same time, raise your right arm and straighten it in front of you. Do not lift your leg higher than your hip. Do not lift your arm higher than your shoulder. Keep your abdominal and back muscles tight. Keep your hips facing the ground. Do not arch your back. Keep your balance carefully, and do not hold your breath. Hold this position for __________ seconds. Slowly return to the starting position. Repeat with your right leg and your left arm. Repeat __________ times. Complete this exercise __________ times a  day. Abdominal set with straight leg raise  Lie on your back on a firm bed or the floor. Bend one of your knees and keep your other leg straight. Tense your abdominal muscles and lift your straight leg up, 4-6 inches (10-15 cm) off the ground. Keep your abdominal muscles tight and hold this position for __________ seconds. Do not hold your breath. Do not arch your back. Keep it flat against the ground. Keep your abdominal muscles tense as you slowly lower your leg back to the starting position. Repeat with  your other leg. Repeat __________ times. Complete this exercise __________ times a day. Single leg lower with bent knees Lie on your back on a firm bed or the floor. Tense your abdominal muscles and lift your feet off the floor, one foot at a time, so your knees and hips are bent in 90-degree angles (right angles). Your knees should be over your hips and your lower legs should be parallel to the floor. Keeping your abdominal muscles tense and your knee bent, slowly lower one of your legs so your toe touches the ground. Lift your leg back up to return to the starting position. Do not hold your breath. Do not let your back arch. Keep your back flat against the ground. Repeat with your other leg. Repeat __________ times. Complete this exercise __________ times a day. Posture and body mechanics Good posture and healthy body mechanics can help to relieve stress in your body's tissues and joints. Body mechanics refers to the movements and positions of your body while you do your daily activities. Posture is part of body mechanics. Good posture means: Your spine is in its natural S-curve position (neutral). Your shoulders are pulled back slightly. Your head is not tipped forward (neutral). Follow these guidelines to improve your posture and body mechanics in your everyday activities. Standing  When standing, keep your spine neutral and your feet about hip-width apart. Keep a slight bend in  your knees. Your ears, shoulders, and hips should line up. When you do a task in which you stand in one place for a long time, place one foot up on a stable object that is 2-4 inches (5-10 cm) high, such as a footstool. This helps keep your spine neutral. Sitting  When sitting, keep your spine neutral and keep your feet flat on the floor. Use a footrest, if necessary, and keep your thighs parallel to the floor. Avoid rounding your shoulders, and avoid tilting your head forward. When working at a desk or a computer, keep your desk at a height where your hands are slightly lower than your elbows. Slide your chair under your desk so you are close enough to maintain good posture. When working at a computer, place your monitor at a height where you are looking straight ahead and you do not have to tilt your head forward or downward to look at the screen. Resting When lying down and resting, avoid positions that are most painful for you. If you have pain with activities such as sitting, bending, stooping, or squatting, lie in a position in which your body does not bend very much. For example, avoid curling up on your side with your arms and knees near your chest (fetal position). If you have pain with activities such as standing for a long time or reaching with your arms, lie with your spine in a neutral position and bend your knees slightly. Try the following positions: Lying on your side with a pillow between your knees. Lying on your back with a pillow under your knees. Lifting  When lifting objects, keep your feet at least shoulder-width apart and tighten your abdominal muscles. Bend your knees and hips and keep your spine neutral. It is important to lift using the strength of your legs, not your back. Do not lock your knees straight out. Always ask for help to lift heavy or awkward objects. This information is not intended to replace advice given to you by your health care provider. Make sure you  discuss any questions you  have with your health care provider. Document Revised: 04/30/2022 Document Reviewed: 03/14/2020 Elsevier Patient Education  2024 [employer].

## 2023-06-20 ENCOUNTER — Ambulatory Visit
Admission: RE | Admit: 2023-06-20 | Discharge: 2023-06-20 | Disposition: A | Discharge status: Home / Self Care | Source: Ambulatory Visit | Attending: Hematology and Oncology | Admitting: Hematology and Oncology

## 2023-06-20 DIAGNOSIS — Z1231 Encounter for screening mammogram for malignant neoplasm of breast: Secondary | ICD-10-CM

## 2023-06-25 ENCOUNTER — Other Ambulatory Visit: Payer: Self-pay | Admitting: Hematology and Oncology

## 2023-06-25 DIAGNOSIS — R928 Other abnormal and inconclusive findings on diagnostic imaging of breast: Secondary | ICD-10-CM

## 2023-06-26 ENCOUNTER — Other Ambulatory Visit: Payer: Self-pay | Admitting: Hematology and Oncology

## 2023-06-26 ENCOUNTER — Ambulatory Visit
Admission: RE | Admit: 2023-06-26 | Discharge: 2023-06-26 | Disposition: A | Discharge status: Home / Self Care | Source: Ambulatory Visit | Attending: Hematology and Oncology

## 2023-06-26 ENCOUNTER — Other Ambulatory Visit (HOSPITAL_COMMUNITY): Payer: Self-pay | Admitting: Cardiology

## 2023-06-26 ENCOUNTER — Ambulatory Visit
Admission: RE | Admit: 2023-06-26 | Discharge: 2023-06-26 | Disposition: A | Discharge status: Home / Self Care | Source: Ambulatory Visit | Attending: Hematology and Oncology | Admitting: Hematology and Oncology

## 2023-06-26 DIAGNOSIS — R928 Other abnormal and inconclusive findings on diagnostic imaging of breast: Secondary | ICD-10-CM

## 2023-06-26 DIAGNOSIS — R921 Mammographic calcification found on diagnostic imaging of breast: Secondary | ICD-10-CM

## 2023-06-26 DIAGNOSIS — N6320 Unspecified lump in the left breast, unspecified quadrant: Secondary | ICD-10-CM

## 2023-07-18 ENCOUNTER — Other Ambulatory Visit: Payer: Self-pay | Admitting: Hematology and Oncology

## 2023-08-07 ENCOUNTER — Other Ambulatory Visit (HOSPITAL_COMMUNITY): Payer: Self-pay | Admitting: Cardiology

## 2023-08-10 ENCOUNTER — Other Ambulatory Visit: Payer: Self-pay | Admitting: Rheumatology

## 2023-08-12 NOTE — Telephone Encounter (Signed)
 Last Fill: 05/14/2023  Labs: 07/11/2023  Glucose 105 CBC 04/25/2023 RBC 5.2 MCH 27 MCHC 32.3 Neutrophil Absolute 1.6  Next Visit: 11/14/2023  Last Visit: 06/14/2023  DX: Rheumatoid arthritis of multiple sites with negative rheumatoid factor   Current Dose per office note 06/14/2023: Colchicine  0.6 mg 1/2 tablet daily as needed   Okay to refill Colchicine ?

## 2023-10-04 ENCOUNTER — Telehealth: Payer: Self-pay | Admitting: Rheumatology

## 2023-10-04 NOTE — Telephone Encounter (Signed)
 Pt called wondering if she can get a prescription for a chair lift for her home. Pt stated she PCP told her that Dr. Dolphus might be able to handle that. Pt stated to call her at this number. [phone]

## 2023-10-04 NOTE — Telephone Encounter (Signed)
 She may get the prescription from us  or her PCP.  However, for the approval through Medicare she may need an appointment for evaluation and justification of chairlift.

## 2023-10-04 NOTE — Telephone Encounter (Signed)
 Patient advised she may get the prescription from us  or her PCP.  However, for the approval through Medicare she may need an appointment for evaluation and justification of chairlift.

## 2023-10-25 ENCOUNTER — Other Ambulatory Visit (HOSPITAL_COMMUNITY): Payer: Self-pay | Admitting: Cardiology

## 2023-10-29 NOTE — Progress Notes (Deleted)
 Office Visit Note  Patient: Tara Cox             Date of Birth: 19-May-1934           MRN: [id number]             PCP: Lazoff, Shawn P, DO Referring: Tanda Prentice DEL, MD Visit Date: 11/12/2023 Occupation: Data Unavailable  Subjective:  No chief complaint on file.   History of Present Illness: Juliann Olesky is a 89 y.o. female ***     Activities of Daily Living:  Patient reports morning stiffness for *** {minute/hour:19697}.   Patient {ACTIONS;DENIES/REPORTS:21021675::Denies} nocturnal pain.  Difficulty dressing/grooming: {ACTIONS;DENIES/REPORTS:21021675::Denies} Difficulty climbing stairs: {ACTIONS;DENIES/REPORTS:21021675::Denies} Difficulty getting out of chair: {ACTIONS;DENIES/REPORTS:21021675::Denies} Difficulty using hands for taps, buttons, cutlery, and/or writing: {ACTIONS;DENIES/REPORTS:21021675::Denies}  No Rheumatology ROS completed.   PMFS History:  Patient Active Problem List   Diagnosis Date Noted   Aortic atherosclerosis 04/01/2018   Chronic deep vein thrombosis (DVT) of left upper extremity (HCC) 01/17/2018   Rheumatoid arthritis of multiple sites with negative rheumatoid factor (HCC) 11/13/2017   Port-A-Cath in place 07/24/2017   Breast cancer, stage 2, right (HCC) 05/07/2017   Malignant neoplasm of central portion of right breast in female, estrogen receptor positive (HCC) 04/02/2017   Primary insomnia 09/27/2016   ANA positive 09/27/2016   Complete tear of right rotator cuff 05/11/2016   High risk medication use 05/07/2016   Uterine fibroid 12/08/2015   History of miscarriage 12/08/2015   Esophagitis 12/08/2015   Diaphragmatic hernia 12/08/2015   Calcium  pyrophosphate deposition disease 12/08/2015   Osteoarthritis of both hands 12/08/2015   Osteoarthritis of both feet 12/08/2015   History of total right knee replacement 12/08/2015   Chondromalacia of both patellae 12/08/2015   DDD (degenerative disc disease), lumbar  12/08/2015   Essential hypertension 06/26/2013   Paroxysmal atrial fibrillation (HCC) 06/26/2013   Hyperlipidemia 06/26/2013   DJD (degenerative joint disease) of knee 01/26/2013   Hemorrhoids 07/11/2010   IBS (irritable bowel syndrome) 07/11/2010   Fatigue/loss of sleep 07/11/2010   Night sweats 07/11/2010   Chills 07/11/2010   Weight gain 07/11/2010   Inflammatory arthritis 07/11/2010   Incontinence of urine 07/11/2010   Thrombosed external hemorrhoids 07/11/2010    Past Medical History:  Diagnosis Date   A-fib [facility])    Arthritis    Breast cancer (HCC) 05/07/2017   Right Breast Cancer   Cancer (HCC)    Chondromalacia, patella 12/08/2015   DDD (degenerative disc disease), lumbar 12/08/2015   S/P discectomy    Diaphragmatic hernia 12/08/2015   Dysrhythmia    A-Fib   Edema of lower extremity 06/29/2009   Lower Venous Exam- normal. No evidence of thrombus or thrombophlebitis.   Esophagitis 12/08/2015   Frequent urination at night    GERD (gastroesophageal reflux disease)    Hemorrhoids    History of calcium  pyrophosphate deposition disease (CPPD) 12/08/2015   History of hiatal hernia    History of miscarriage 12/08/2015   x3   History of total right knee replacement 12/08/2015   Hypertension 02/24/2009   Echo-EF 62%; Myocardial Perfusion Study-Normal. No signifcant ischemia noted.   IBS (irritable bowel syndrome)    Migraine    Osteoarthritis of both feet 12/08/2015   Osteoarthritis of both hands 12/08/2015   Personal history of chemotherapy 2019   Right Breast Cancer   Personal history of radiation therapy 2019   Right Breast Cancer   PONV (postoperative nausea and vomiting)    Uterine fibroid 12/08/2015  Vertigo     Family History  Problem Relation Age of Onset   Other Mother    Varicose Veins Mother    Emphysema Mother    Heart disease Father        heart attack   Diabetes Sister    Other Sister        pacemaker   Diabetes Sister    Heart disease  Sister    Breast cancer Neg Hx    BRCA 1/2 Neg Hx    Past Surgical History:  Procedure Laterality Date   ABDOMINAL HYSTERECTOMY  1974   APPENDECTOMY     BACK SURGERY  2001   BUNIONECTOMY  1986   CESAREAN SECTION  1971   COLONOSCOPY     COLONOSCOPY WITH PROPOFOL  N/A 05/28/2014   Procedure: COLONOSCOPY WITH PROPOFOL ;  Surgeon: Belvie Just, MD;  Location: [facility];  Service: Endoscopy;  Laterality: N/A;   ESOPHAGOGASTRODUODENOSCOPY (EGD) WITH PROPOFOL  N/A 05/28/2014   Procedure: ESOPHAGOGASTRODUODENOSCOPY (EGD) WITH PROPOFOL ;  Surgeon: Belvie Just, MD;  Location: [facility];  Service: Endoscopy;  Laterality: N/A;   EXPLORATORY LAPAROTOMY     open procedure   EYE SURGERY Bilateral    cataract removal   MASTECTOMY Right 05/07/2017   PORT-A-CATH REMOVAL Right 07/17/2018   Procedure: PORT REMOVAL;  Surgeon: Vanderbilt Ned, MD;  Location: [facility];  Service: General;  Laterality: Right;   PORTACATH PLACEMENT Right 05/07/2017   Procedure: INSERTION PORT-A-CATH;  Surgeon: Vanderbilt Ned, MD;  Location: [facility];  Service: General;  Laterality: Right;   SHOULDER SURGERY Right    SIMPLE MASTECTOMY WITH AXILLARY SENTINEL NODE BIOPSY Right 05/07/2017   Procedure: RIGHT SIMPLE MASTECTOMY WITH AXILLARY SENTINEL NODE BIOPSY;  Surgeon: Vanderbilt Ned, MD;  Location: [facility];  Service: General;  Laterality: Right;   TOTAL KNEE ARTHROPLASTY Right 01/26/2013   DR RUBIE   TOTAL KNEE ARTHROPLASTY Right 01/26/2013   Procedure: TOTAL KNEE ARTHROPLASTY;  Surgeon: Marcey RUBIE, MD;  Location: [facility];  Service: Orthopedics;  Laterality: Right;   UTERINE FIBROID SURGERY  1967   Social History   Tobacco Use   Smoking status: Former    Current packs/day: 0.00    Average packs/day: 1 pack/day for 20.0 years (20.0 ttl pk-yrs)    Types: Cigarettes    Start date: 02/19/1970    Quit date: 02/19/1990    Years since quitting: 33.7    Passive exposure: Never   Smokeless tobacco: Never   Vaping Use   Vaping status: Never Used  Substance Use Topics   Alcohol use: No   Drug use: Never   Social History   Social History Narrative   [phone] Unable to ask abuse questions husband with her today.     Immunization History  Administered Date(s) Administered   INFLUENZA, HIGH DOSE SEASONAL PF 10/07/2015, 10/19/2016, 10/25/2017   PFIZER Comirnaty [place] Top)Covid-19 Tri-Sucrose Vaccine 04/13/2020   PFIZER(Purple Top)SARS-COV-2 Vaccination 01/25/2019, 02/13/2019, 09/15/2019   Pfizer Covid-19 Vaccine Bivalent Booster 75yrs & up 09/27/2020   Pfizer(Comirnaty )Fall Seasonal Vaccine 12 years and older 10/18/2021   Pneumococcal Conjugate-13 11/24/2013   Pneumococcal Polysaccharide-23 01/10/2003   Tdap 03/21/2017, 03/21/2017   Zoster Recombinant(Shingrix) 03/21/2017, 03/21/2017   Zoster, Live 08/01/2012     Objective: Vital Signs: There were no vitals taken for this visit.   Physical Exam   Musculoskeletal Exam: ***  CDAI Exam: CDAI Score: -- Patient Global: --; Provider Global: -- Swollen: --; Tender: -- Joint Exam 11/12/2023   No joint exam has been documented  for this visit   There is currently no information documented on the homunculus. Go to the Rheumatology activity and complete the homunculus joint exam.  Investigation: No additional findings.  Imaging: No results found.  Recent Labs: Lab Results  Component Value Date   WBC 6.1 04/24/2023   HGB 15.7 (H) 04/24/2023   PLT PLATELET CLUMPS NOTED ON SMEAR, UNABLE TO ESTIMATE 04/24/2023   NA 142 12/14/2022   K 4.3 12/14/2022   CL 103 12/14/2022   CO2 29 12/14/2022   GLUCOSE 115 (H) 12/14/2022   BUN 16 12/14/2022   CREATININE 0.87 12/14/2022   BILITOT 0.5 12/14/2022   ALKPHOS 116 07/10/2022   AST 74 (H) 12/14/2022   ALT 74 (H) 12/14/2022   PROT 6.8 12/14/2022   ALBUMIN 3.8 07/10/2022   CALCIUM  10.2 12/14/2022   GFRAA 67 08/31/2019   QFTBGOLDPLUS NEGATIVE 08/31/2019    Speciality Comments: PLQ  eye exam: 08/21/18 normal. Dr. Octavia. Follow up in 1 year.  Procedures:  No procedures performed Allergies: Oxycodone, Hydrocodone , Percocet [oxycodone-acetaminophen ], Soma [carisoprodol], and Ultram [tramadol hcl]   Assessment / Plan:     Visit Diagnoses: Rheumatoid arthritis of multiple sites with negative rheumatoid factor (HCC)  Calcium  pyrophosphate deposition disease  Medication monitoring encounter  Primary osteoarthritis of both hands  Trigger finger, right ring finger  Primary osteoarthritis of both feet  Plantar fasciitis, bilateral  History of total right knee replacement  Degeneration of intervertebral disc of lumbar region without discogenic back pain or lower extremity pain  Osteopenia of multiple sites  Pure hypercholesterolemia  Essential hypertension  Paroxysmal atrial fibrillation (HCC)  History of DVT (deep vein thrombosis)  Malignant neoplasm of central portion of right breast in female, estrogen receptor positive (HCC)  Orders: No orders of the defined types were placed in this encounter.  No orders of the defined types were placed in this encounter.   Face-to-face time spent with patient was *** minutes. Greater than 50% of time was spent in counseling and coordination of care.  Follow-Up Instructions: No follow-ups on file.   Waddell CHRISTELLA Craze, PA-C  Note - This record has been created using Dragon software.  Chart creation errors have been sought, but may not always  have been located. Such creation errors do not reflect on  the standard of medical care.

## 2023-11-12 ENCOUNTER — Ambulatory Visit: Admitting: Physician Assistant

## 2023-11-12 DIAGNOSIS — M19041 Primary osteoarthritis, right hand: Secondary | ICD-10-CM

## 2023-11-12 DIAGNOSIS — M722 Plantar fascial fibromatosis: Secondary | ICD-10-CM

## 2023-11-12 DIAGNOSIS — Z5181 Encounter for therapeutic drug level monitoring: Secondary | ICD-10-CM

## 2023-11-12 DIAGNOSIS — I10 Essential (primary) hypertension: Secondary | ICD-10-CM

## 2023-11-12 DIAGNOSIS — M0609 Rheumatoid arthritis without rheumatoid factor, multiple sites: Secondary | ICD-10-CM

## 2023-11-12 DIAGNOSIS — M51369 Other intervertebral disc degeneration, lumbar region without mention of lumbar back pain or lower extremity pain: Secondary | ICD-10-CM

## 2023-11-12 DIAGNOSIS — Z86718 Personal history of other venous thrombosis and embolism: Secondary | ICD-10-CM

## 2023-11-12 DIAGNOSIS — Z96651 Presence of right artificial knee joint: Secondary | ICD-10-CM

## 2023-11-12 DIAGNOSIS — M65341 Trigger finger, right ring finger: Secondary | ICD-10-CM

## 2023-11-12 DIAGNOSIS — M8589 Other specified disorders of bone density and structure, multiple sites: Secondary | ICD-10-CM

## 2023-11-12 DIAGNOSIS — C50111 Malignant neoplasm of central portion of right female breast: Secondary | ICD-10-CM

## 2023-11-12 DIAGNOSIS — M19071 Primary osteoarthritis, right ankle and foot: Secondary | ICD-10-CM

## 2023-11-12 DIAGNOSIS — E7800 Pure hypercholesterolemia, unspecified: Secondary | ICD-10-CM

## 2023-11-12 DIAGNOSIS — M1120 Other chondrocalcinosis, unspecified site: Secondary | ICD-10-CM

## 2023-11-12 DIAGNOSIS — I480 Paroxysmal atrial fibrillation: Secondary | ICD-10-CM

## 2023-11-14 ENCOUNTER — Ambulatory Visit: Admitting: Physician Assistant

## 2023-12-27 ENCOUNTER — Other Ambulatory Visit

## 2023-12-27 ENCOUNTER — Encounter
